# Patient Record
Sex: Male | Born: 1940 | Race: White | Hispanic: No | Marital: Married | Smoking: Former smoker
Health system: Southern US, Community
[De-identification: ages and names within clinical notes are randomized; demographics above are authoritative.]

## PROBLEM LIST (undated history)

## (undated) DIAGNOSIS — E291 Testicular hypofunction: Secondary | ICD-10-CM

## (undated) DIAGNOSIS — G47419 Narcolepsy without cataplexy: Secondary | ICD-10-CM

## (undated) DIAGNOSIS — M7520 Bicipital tendinitis, unspecified shoulder: Secondary | ICD-10-CM

## (undated) DIAGNOSIS — R609 Edema, unspecified: Secondary | ICD-10-CM

## (undated) DIAGNOSIS — J449 Chronic obstructive pulmonary disease, unspecified: Secondary | ICD-10-CM

## (undated) DIAGNOSIS — M67919 Unspecified disorder of synovium and tendon, unspecified shoulder: Secondary | ICD-10-CM

## (undated) DIAGNOSIS — R413 Other amnesia: Secondary | ICD-10-CM

## (undated) DIAGNOSIS — E669 Obesity, unspecified: Secondary | ICD-10-CM

## (undated) DIAGNOSIS — E119 Type 2 diabetes mellitus without complications: Secondary | ICD-10-CM

## (undated) DIAGNOSIS — R310 Gross hematuria: Secondary | ICD-10-CM

## (undated) DIAGNOSIS — I7090 Unspecified atherosclerosis: Secondary | ICD-10-CM

## (undated) DIAGNOSIS — R0902 Hypoxemia: Secondary | ICD-10-CM

## (undated) HISTORY — DX: Obesity, unspecified: E66.9

## (undated) HISTORY — DX: Gross hematuria: R31.0

## (undated) HISTORY — PX: KNEE ARTHROSCOPY: SUR90

## (undated) HISTORY — PX: CORONARY STENT PLACEMENT: SHX1402

## (undated) HISTORY — DX: Hypoxemia: R09.02

## (undated) HISTORY — DX: Unspecified disorder of synovium and tendon, unspecified shoulder: M67.919

## (undated) HISTORY — DX: Bicipital tendinitis, unspecified shoulder: M75.20

## (undated) HISTORY — DX: Other amnesia: R41.3

## (undated) HISTORY — DX: Edema, unspecified: R60.9

## (undated) HISTORY — DX: Unspecified atherosclerosis: I70.90

## (undated) HISTORY — DX: Testicular hypofunction: E29.1

## (undated) HISTORY — DX: Chronic obstructive pulmonary disease, unspecified: J44.9

---

## 2011-01-17 DIAGNOSIS — G629 Polyneuropathy, unspecified: Secondary | ICD-10-CM | POA: Insufficient documentation

## 2011-03-30 DIAGNOSIS — E291 Testicular hypofunction: Secondary | ICD-10-CM | POA: Insufficient documentation

## 2011-03-30 DIAGNOSIS — R29898 Other symptoms and signs involving the musculoskeletal system: Secondary | ICD-10-CM | POA: Insufficient documentation

## 2011-03-30 HISTORY — DX: Testicular hypofunction: E29.1

## 2011-06-25 DIAGNOSIS — J309 Allergic rhinitis, unspecified: Secondary | ICD-10-CM | POA: Insufficient documentation

## 2011-09-04 DIAGNOSIS — I2510 Atherosclerotic heart disease of native coronary artery without angina pectoris: Secondary | ICD-10-CM | POA: Insufficient documentation

## 2011-09-23 DIAGNOSIS — E559 Vitamin D deficiency, unspecified: Secondary | ICD-10-CM | POA: Insufficient documentation

## 2011-09-23 DIAGNOSIS — Z125 Encounter for screening for malignant neoplasm of prostate: Secondary | ICD-10-CM | POA: Insufficient documentation

## 2012-02-06 DIAGNOSIS — G47419 Narcolepsy without cataplexy: Secondary | ICD-10-CM | POA: Insufficient documentation

## 2012-08-30 DIAGNOSIS — G4733 Obstructive sleep apnea (adult) (pediatric): Secondary | ICD-10-CM | POA: Insufficient documentation

## 2013-04-28 DIAGNOSIS — R310 Gross hematuria: Secondary | ICD-10-CM

## 2013-04-28 HISTORY — DX: Gross hematuria: R31.0

## 2013-10-09 DIAGNOSIS — R269 Unspecified abnormalities of gait and mobility: Secondary | ICD-10-CM | POA: Insufficient documentation

## 2014-01-13 DIAGNOSIS — R413 Other amnesia: Secondary | ICD-10-CM

## 2014-01-13 HISTORY — DX: Other amnesia: R41.3

## 2014-03-17 DIAGNOSIS — H9190 Unspecified hearing loss, unspecified ear: Secondary | ICD-10-CM | POA: Insufficient documentation

## 2014-05-11 ENCOUNTER — Ambulatory Visit: Payer: Self-pay | Admitting: Family Medicine

## 2014-05-29 DIAGNOSIS — M7520 Bicipital tendinitis, unspecified shoulder: Secondary | ICD-10-CM | POA: Insufficient documentation

## 2014-05-29 DIAGNOSIS — M67919 Unspecified disorder of synovium and tendon, unspecified shoulder: Secondary | ICD-10-CM | POA: Insufficient documentation

## 2014-05-29 HISTORY — DX: Unspecified disorder of synovium and tendon, unspecified shoulder: M67.919

## 2014-05-29 HISTORY — DX: Bicipital tendinitis, unspecified shoulder: M75.20

## 2014-08-03 ENCOUNTER — Inpatient Hospital Stay: Payer: Self-pay | Admitting: Internal Medicine

## 2014-10-22 NOTE — H&P (Signed)
PATIENT NAME:  Miguel Palmer, Miguel Palmer MR#:  [id number] DATE OF BIRTH:  04/20/1941  DATE OF ADMISSION:  08/03/2014  PRIMARY CARE PHYSICIAN:  From [facility].   EMERGENCY ROOM PHYSICIAN:  Dr. Corky Downs.   CHIEF COMPLAINT:  Shortness of breath, cough.   HISTORY OF PRESENT ILLNESS: A 74 year old male patient with a history of high blood pressure, coronary artery disease, diabetes, came in because of shortness of breath, cough, and some hemoptysis. The patient has been having these symptoms for about month and went to see his primary doctor, who gave antibiotics. The patient finished 3 rounds of antibiotics, reportedly he took 5 days of azithromycin and Levaquin for 10 days, and today comes in because of because of persistent shortness of breath and cough and phlegm with occasional blood-tinged sputum. The patient's O2 saturations are 80% on room air, he is right now on 3-4 liters, saturating around 95%.  He uses 2 liters of oxygen at night only.  The patient is afebrile. The patient has no chest pain. Does have lower extremity edema.  The patient has no weight loss. No night sweats.   PAST MEDICAL HISTORY: Significant for coronary artery disease, hypertension, hyperlipidemia, diabetes mellitus type 2, history of narcolepsy, catalepsy, peripheral neuropathy  PAST SURGICAL HISTORY:  Includes CAD with 7 stents placed, last stent was 8 years ago. His cardiologist at [facility] Dr. Aline Brochure. The patient denies any other surgeries.    SOCIAL HISTORY:  Previous smoker; quit 20 years ago. No alcohol. No drugs. Lives with the wife.   FAMILY HISTORY: Significant for grandfather and father had heart problems, but no history of lung cancers. The patient's mother had emphysema.   MEDICATIONS:    1. Crestor 20 mg p.o. daily.  .  3. Effient 10 mg in the morning.  4. Neurontin 300 mg 2 tablets in the morning and noon, afternoon, and evening.  5. Imdur mononitrate 30 mg in the morning.  6. Metformin 500 mg in the morning.   7.Crestor 20 mg in the morning.  8. Modafinil 200 mg morning, noon, and afternoon.  9. Niaspan 1000 mg at bedtime.  10. Nitroglycerin 0.4 mg as needed.  11. He is also on supplements.  12. Aspirin 81 mg daily.  13. Co-Q10 200 mg p.o. b.i.d.  14. Ginkgo biloba 60 mg twice daily.   15. Glucosamine sulfate 1000 mg p.o. b.i.d.   16. Krill oil 300 mg daily.   17. Multivitamin 1 tablet daily.  18. Turmeric 400 mg once a day.  19. Tylenol as needed.   ALLERGIES: He has no known allergies.   REVIEW OF SYSTEMS:   CONSTITUTIONAL: The patient has no fever. Does have some shortness of breath, cough. Denies any weight loss.  EYES: No blurred vision. No inflammation. No glaucoma,  EARS, NOSE, THROAT: Hard of hearing. No epistaxis. No difficulty swallowing.  RESPIRATION:  Does have some cough and also some occasional phlegm with some blood-tinged sputum. The patient says that he has sleep apnea and supposed to take CPAP, but he is not using that.  CARDIOVASCULAR: No chest pain. No orthopnea. Does have pedal edema.  GASTROINTESTINAL: No nausea. No vomiting. No abdominal pain.  GENITOURINARY: No dysuria.  ENDOCRINE: No polyuria or nocturia.  HEMATOLOGIC:  No anemia.  INTEGUMENTARY: No skin rashes.  MUSCULOSKELETAL: No joint pain.  NEUROLOGIC: No numbness or weakness. The patient has a history of neuropathy.  PSYCHIATRIC: No anxiety and insomnia.  Has cataplexy. The patient also has narcolepsy.    PHYSICAL  EXAMINATION:  VITAL SIGNS: Temperature 98.4, heart rate 105, blood pressure 152/89, saturations 80% on room air and he is on 2 liters, saturation 95%.  GENERAL: He is alert, awake, oriented, obese male with a BMI 46.6.  Seen in the ER, not in distress, answering questions appropriately.  HEENT:  Head is atraumatic, normocephalic. Eyes, pupils equal, reacting to light. Extraocular movements are intact. The patient has no conjunctivitis. Hearing is a factor, he is hard of hearing. Tympanic  membranes are clear. No pharyngeal erythema. Mucous membranes are normal. Dentition is good.  NECK:  No thyroid enlargement. Supple. No JVD. The patient has no carotid bruit.  RESPIRATORY: Bilateral coarse breath sounds noted in all lung fields and basilar crepitations present. Not using accessory muscles of respiration.  CARDIOVASCULAR: S1, S2 regular.  The patient has no murmurs. PMI not displaced. The patient has good pedal pulse present, good femoral pulse. Lower extremity edema up to knees bilaterally 2 +.  ABDOMEN: Soft, nontender, nondistended. Bowel sounds present. No hepatosplenomegaly.  GENITOURINARY: Deferred.  MUSCULOSKELETAL: 5 out of 5 strength upper and lower extremities. No kyphosis.  SKIN: No skin rashes. Warm and dry, well hydrated.   LYMPHATIC:  No lymphadenopathy in cervical or axillary region.  NEUROLOGIC:  Cranial nerves II through XII intact. Power 5 out of 5 in upper and lower extremities. Sensory intact. DTRs 2 + bilaterally.  PSYCHIATRIC: Mood and affect are within normal limits.  LABORATORY DATA: WBC 6.7, hemoglobin 14.9, hematocrit 46.7, platelets 152,000. Troponin less than 0.02. Electrolytes, sodium is 139, potassium 4.2, chloride 102, bicarbonate 33, BUN is 13, creatinine 0.87, glucose 122. The patient's chest x-ray showed shallow inspiration with diffuse interstitial pattern suggesting fibrosis and chronic bronchitis, the patient has nodular opacities in the right upper lung, CT is needed.  CT angio chest shows no evidence of PE, multifocal patchy areas of consolidation in both lungs most prominent in the right upper lung. The patient needs followup resolution to evaluate for underlying mass.    EKG shows sinus rhythm with first degree AV block, PACs, 96 beats per minute.   ASSESSMENT AND PLAN: This is a 74 year old male patient with acute hypoxic respiratory failure secondary to multilobar pneumonia. 1.  Failed outpatient therapy. The patient finished 2 rounds of  antibiotics, comes in because of shortness of breath and cough pneumonia on x-ray. Admit him to hospitalist service. Start on vancomycin and Zosyn and continue oxygen, add a small dose of Solu-Medrol 40 IV daily. Add Spiriva and also albuterol nebulizers. Obtain pulmonarey consult  with DR.Fleming 2.  Coronary artery disease with multiple stents placement. No chest pain at this time.  No EKG changes.  Continue him on aspirin, Effient, Crestor, Niaspan, and Imdur.  3.  Neuropathy. Continue Neurontin.  4.  History of narcolepsy, cataplexy. Continue modafinil 200 mg 3 times a day.  5.  Diabetes mellitus type 2. Hold the metformin because of CT chest with the contrast.  6.  Continue sliding scale coverage for diabetes.  7.  Lower extremity edema. The patient will be given small dose of Lasix and see how he does. The patient has a cardiologist at [facility], so we can get the records to see if he had any echo done recently.   TIME SPENT: About 55 minutes.   Discussed the plan with the patient and the patient's wife.    ____________________________ Epifanio Lesches, MD sk:bu D: 08/03/2014 19:20:44 ET T: 08/03/2014 19:52:48 ET JOB#: [id number]  cc: Epifanio Lesches, MD, <Dictator> Epifanio Lesches MD  ELECTRONICALLY SIGNED 08/28/2014 13:38

## 2014-10-22 NOTE — Discharge Summary (Signed)
PATIENT NAME:  Miguel Palmer, Miguel Palmer MR#:  [id number] DATE OF BIRTH:  [birth year]  DATE OF ADMISSION:  08/03/2014 DATE OF DISCHARGE:  08/07/2014  ADMITTING DIAGNOSES:  1.  Acute hypoxic respiratory failure secondary to multilobar pneumonia. Failed outpatient antibiotics.  2.  Coronary artery disease, with multiple stents.  3.  Neuropathy.  4.  History of narcolepsy and cataplexy.  5.  Diabetes mellitus type 2.  6.  Bilateral lower extremity edema.   DISCHARGE DIAGNOSES:  1.  Acute on chronic hypoxic respiratory failure secondary to multilobar pneumonia.  2.  Coronary artery disease, with multiple stents.  3.  Neuropathy.  4.  History of narcolepsy and cataplexy.  5.  Diabetes mellitus type 2. Holding the metformin, which needs to be resumed by the primary care physician during the followup visit.   CONSULTATIONS: Pulmonology, Herbon E. Raul Del, MD.   PROCEDURES: None.  BRIEF HISTORY AND PHYSICAL AND HOSPITAL COURSE: The patient is a 74 year old male with a history of hypertension, coronary artery disease, and diabetes mellitus who came in to the ED with a chief complaint of shortness of breath and cough with some hemoptysis. The patient was seen by primary care physician and has finished 3 rounds of antibiotics, reportedly 5 days of azithromycin and Levaquin for 10 days, with no significant improvement. The patient's pulse oximetry was at 80% on room air at the time of admission and was placed on 3 to 4 L of oxygen. The patient was saturating at 95% on 3 to 4 L of oxygen and at home he uses 2 L of oxygen during nights only. Chest x-ray has revealed multilobar pneumonia and the patient was admitted to the hospital with a chief diagnosis of acute on chronic hypoxic respiratory failure secondary to multilobar pneumonia. As patient failed 2 rounds of antibiotics with erythromycin and levofloxacin, the patient was started on Zosyn and vancomycin. He was continued on oxygen as well as a small dose of  Solu-Medrol 40 mg IV daily.   HOSPITAL COURSE: 1.  The patient was continued on IV Zosyn and vancomycin. A small dose of Solu-Medrol was given . The patient was given  albuterol nebulizer treatments. He was seen by pulmonologist, Dr. Raul Del. The patient's clinical situation significantly improved with IV antibiotics. The patient had a CAT scan of the chest which has revealed ground glass opacities. As there was a concern for atypical infection versus cryptogenic organizing pneumonia, urine for legionella antigen and serum for mycoplasma were checked. ACE level was ordered. Repeat chest x-ray had improvement and patient was improving clinically. The plan was to discharge the patient with Ceftin and azithromycin to cover community-acquired pneumonia as well as atypicals. Dr. Raul Del was recommending outpatient bronchoscopy. Clinically his situation was improved and decision was made to discharge the patient home. Resting pulse oximetry was 84% on room air, so patient was needing 4 L of oxygen during the entire hospital course. Even at the time of discharge, the patient was saturating 92% with oxygen during exertion on 4 L and 84% on room air at rest. Case management was consulted for the continued supply of oxygen, 4 L via nasal cannula.  2.  Coronary artery disease, with multiple stents. No chest pain and no EKG changes. The plan is to continue Crestor, Niaspan, and Imdur.  3.  Neuropathy: Continue Neurontin.  4.  History of narcolepsy and cataplexy. Continue his home medication modafinil 200 mg 3 times a day. 5.  Diabetes mellitus type 2. The patient had a CAT scan  of the chest with contrast so metformin was held for 72 hours. The patient was covered with insulin sliding scale and the metformin needs to be resumed in the next 2 days by the primary care physician during his followup visit. 6.  Lower extremity edema. The patient was given Lasix for lower extremity edema, which has significantly improved, and  his clinical situation significantly improved and decision is made to discharge the patient home.   PHYSICAL EXAMINATION:  VITAL SIGNS: Temperature 97.6, pulse 76, respirations 18, blood pressure 128/83, pulse oximetry is 92% on 4 L.  GENERAL APPEARANCE: Not in any acute distress. Moderately built and nourished.  HEENT: Normocephalic, atraumatic. Pupils are equally reacting to light and accommodation. No scleral icterus. No conjunctival injection. No sinus tenderness. Moist mucous membranes.  NECK: Supple. No JVD. No thyromegaly. Range of motion is intact.  LUNGS: Normal respiratory effort, minimal crackles are present. No rhonchi.  CARDIAC: Regular rate and rhythm. No murmurs.  GASTROINTESTINAL: Soft. Bowel sounds are positive in all 4 quadrants. Nontender, nondistended. No masses felt.  NEUROLOGIC: Awake, alert, oriented x 3. Follows verbal commands. Cranial nerves II through XII are grossly intact. Motor and sensory are intact.  SKIN: With no rashes, no ulcers.  PSYCHIATRIC: Awake, alert, and oriented x 3. Normal mood and affect.  LABORATORY AND IMAGING STUDIES: A repeat chest x-ray on 2022-08-19, PA and lateral views, revealed some improvement in bilateral airspace disease, most consistent with pneumonia. Recommended continued followup. CT, chest, performed on February 11, including CT angiogram. No evidence of significant pulmonary embolus. Multifocal patchy areas of consolidation in both lungs, more prominent in the right upper lung. Changes most likely representing multifocal pneumonia. Followup evaluation is recommended to exclude underlying mass lesion. The patient's creatinine on February 15 is 0.90. Sputum culture: A few gram-positive cocci in pairs, rare yeast was noticed.  MEDICATIONS AT THE TIME OF DISCHARGE: Crestor 20 mg p.o. at bedtime; Effient 10 mg, 1 tablet p.o. once daily in a.m.; gabapentin 300 mg, 2 capsules p.o. 4 times a day; isosorbide mononitrate 30 mg, 1 tablet p.o. once  daily in a.m.; Niaspan 1000 mg oral tablet, 1 tablet p.o. once daily at bedtime; nitroglycerin 0.4 mg sublingually every 5 minutes as needed for chest pain; aspirin 81 mg once daily; coenzyme Q10, 1 tablet p.o. 2 times a day; ginkgo 1 tablet p.o. 2 times a day; dextroamphetamine 15 mg oral capsule, 1 capsule p.o. 4 times a day; metformin 500 mg, 1 tablet p.o. once daily in a.m.; metoprolol succinate 25 mg, 1 tablet p.o. daily a.m.; glucosamine 1 tablet p.o. 2 times a day; krill oil 300 mg, 1 capsule p.o. once daily; multivitamin 1 tablet p.o. once daily; phosphatidylserine 60 mg 1 tablet p.o. 2 times a day; Tylenol 500 mg 2 tablets every 6 hours as needed for pain; Ceftin 500 mg 1 tablet p.o. 2 times a day; azithromycin 100 mg p.o. once daily.   ACTIVITY: As tolerated.   DIET: Diabetic and healthy heart.  FOLLOWUP: With primary care physician in 1 to 2 days and Dr. Raul Del in 1 week.  The diagnosis and plan of care were discussed in detail with the patient and his wife at bedside.   PLAN: To continue 4 L of home oxygen via nasal cannula continuously. The patient and his wife verbalized understanding of the plan.  TOTAL TIME SPENT ON THE DISCHARGE: 45 minutes.   ____________________________ Nicholes Mango, MD ag:ST D: 08/07/2014 22:10:24 ET T: 08/08/2014 01:09:45 ET JOB#: [id number]  cc: Nicholes Mango, MD, <Dictator> Herbon E. Raul Del, MD Primary care physician Nicholes Mango MD ELECTRONICALLY SIGNED 08/18/2014 14:42

## 2014-10-31 ENCOUNTER — Encounter: Payer: Medicare Other | Attending: Family Medicine | Admitting: Respiratory Therapy

## 2014-10-31 VITALS — BP 128/76 | HR 74 | Resp 14 | Ht 74.0 in | Wt 342.6 lb

## 2014-10-31 DIAGNOSIS — E785 Hyperlipidemia, unspecified: Secondary | ICD-10-CM | POA: Insufficient documentation

## 2014-10-31 DIAGNOSIS — I219 Acute myocardial infarction, unspecified: Secondary | ICD-10-CM | POA: Insufficient documentation

## 2014-10-31 DIAGNOSIS — J449 Chronic obstructive pulmonary disease, unspecified: Secondary | ICD-10-CM | POA: Insufficient documentation

## 2014-10-31 NOTE — Addendum Note (Signed)
Addended by: Joretta Bachelor on: 10/31/2014 05:08 PM   Modules accepted: Medications

## 2014-10-31 NOTE — Progress Notes (Signed)
Pulmonary Individual Treatment Plan  Patient Details  Name: Miguel Palmer MRN: [id number] Date of Birth: 1940-09-25 Referring Provider:  [name], MD  Initial Encounter Date: Date: 10/31/14  Patient's Home Medications on Admission:  Current outpatient prescriptions:  .  aspirin 81 MG tablet, Take 81 mg by mouth every other day., Disp: , Rfl:  .  co-enzyme Q-10 30 MG capsule, Take 30 mg by mouth 2 (two) times daily., Disp: , Rfl:  .  dextroamphetamine (DEXEDRINE SPANSULE) 15 MG 24 hr capsule, Take 15 mg by mouth daily., Disp: , Rfl:  .  gabapentin (NEURONTIN) 400 MG capsule, Take by mouth., Disp: , Rfl:  .  isosorbide mononitrate (IMDUR) 30 MG 24 hr tablet, TAKE 1 TABLET BY MOUTH EVERY DAY, Disp: , Rfl:  .  metFORMIN (GLUCOPHAGE) 500 MG tablet, TAKE 1 TABLET BY MOUTH DAILY., Disp: , Rfl:  .  metoprolol succinate (TOPROL-XL) 25 MG 24 hr tablet, TAKE 1 TABLET BY MOUTH EVERY DAY, Disp: , Rfl:  .  modafinil (PROVIGIL) 200 MG tablet, Take by mouth., Disp: , Rfl:  .  niacin (NIASPAN) 1000 MG CR tablet, Take by mouth., Disp: , Rfl:  .  nitroGLYCERIN (NITROSTAT) 0.4 MG SL tablet, Place under the tongue., Disp: , Rfl:  .  prasugrel (EFFIENT) 10 MG TABS tablet, TAKE 1 TABLET BY MOUTH EVERY DAY, Disp: , Rfl:  .  rosuvastatin (CRESTOR) 20 MG tablet, Take by mouth., Disp: , Rfl:  .  tiotropium (SPIRIVA) 18 MCG inhalation capsule, Place into inhaler and inhale., Disp: , Rfl:  .  Ginkgo Biloba 60 MG TABS, Take by mouth., Disp: , Rfl:  .  Glucosamine Sulfate 1000 MG CAPS, Take by mouth., Disp: , Rfl:  .  Multiple Vitamins-Minerals (MULTIVITAMIN & MINERAL PO), Take by mouth., Disp: , Rfl:   Past Medical History: No past medical history on file.  Tobacco Use: History  Smoking status  . Former Smoker -- 2.00 packs/day for 22 years  . Types: Cigarettes  Smokeless tobacco  . Former Systems developer  . Quit date: 09/03/1980    Labs: Recent Review Flowsheet Data    There is no flowsheet data to  display.       ADL UCSD:     ADL UCSD      10/31/14 1131       ADL UCSD   ADL Phase Entry     SOB Score total 75     Rest 1     Walk 1     Stairs 4     Bath 3     Dress 3     Shop 3         Pulmonary Function Assessment:     Pulmonary Function Assessment - 10/31/14 1129    Pulmonary Function Tests   FVC% 56 %   FEV1% 61 %   FEV1/FVC Ratio 83.86   RV% 64 %   DLCO% 45 %   Breath   Bilateral Breath Sounds Clear   Shortness of Breath No;Yes      Exercise Target Goals: Date: 10/31/14  Exercise Program Goal: Individual exercise prescription set with THRR, safety & activity barriers. Participant demonstrates ability to understand and report RPE using BORG scale, to self-measure pulse accurately, and to acknowledge the importance of the exercise prescription.  Exercise Prescription Goal: Starting with aerobic activity 30 plus minutes a day, 3 days per week for initial exercise prescription. Provide home exercise prescription and guidelines that participant acknowledges understanding prior to discharge.  Activity Barriers & Risk Stratification:     Activity Barriers & Risk Stratification - 10/31/14 1128    Activity Barriers & Risk Stratification   Activity Barriers None      6 Minute Walk:     6 Minute Walk      10/31/14 1702       6 Minute Walk   Phase Initial     Distance 940 feet     Walk Time 6 minutes     Resting HR 73 bpm     Resting BP 128/76 mmHg     Max Ex. HR 131 bpm     Max Ex. BP 162/84 mmHg     RPE 15     Perceived Dyspnea  4     Symptoms No        Initial Exercise Prescription:     Initial Exercise Prescription - 10/31/14 1700    Date of Initial Exercise Prescription   Date 10/31/14   Treadmill   MPH 1.4   Grade 0   Minutes 10   Bike   Level 1   Watts 30   Minutes 10   Recumbant Bike   Level 1   RPM 30   Watts 20   Minutes 10   NuStep   Level 2   Watts 50   Minutes 10   Arm Ergometer   Level 1   Watts 10    Minutes 10   Arm/Foot Ergometer   Level 1   Watts 12   Minutes 10   Recumbant Elliptical   Level 1   RPM 40   Watts 20   Minutes 10   Elliptical   Level 1   Speed 3   Minutes 1   REL-XR   Level 2   Watts 50   Minutes 10   Prescription Details   Frequency (times per week) 3   Duration Progress to 30 minutes of continuous aerobic without signs/symptoms of physical distress   Intensity   THRR REST +  30   Ratings of Perceived Exertion 11-15   Perceived Dyspnea 2-4   Resistance Training   Training Prescription Yes   Weight 1   Reps 10-12      Exercise Prescription Changes:   Discharge Exercise Prescription:    Nutrition:  Target Goals: Understanding of nutrition guidelines, daily intake of sodium <1599m, cholesterol <2066m calories 30% from fat and 7% or less from saturated fats, daily to have 5 or more servings of fruits and vegetables.  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 Participant is able to verbalize types and ability to use techniques and skills needed for reducing stress and depression.  Initial Review & Psychosocial Screening:     Initial  Psych Review & Screening - 10/31/14 0900    Initial Review   Current issues with Current Depression;Current Sleep Concerns;Current Stress Concerns   Source of Stress  Concerns Chronic Illness;Unable to participate in former interests or hobbies   [place]? Yes   Barriers   Psychosocial barriers to participate in program There are no identifiable barriers or psychosocial needs.;The patient should benefit from training in stress management and relaxation.   Screening Interventions   Interventions Encouraged to exercise;Program counselor consult      Quality of Life Scores:     Quality of Life - 10/31/14 0900    Quality of Life Scores   Health/Function Pre 11.72 %   Socioeconomic Pre 8.5 %   Psych/Spiritual Pre 16.06 %   Family Pre 10.07 %   GLOBAL Pre 17.4 %      PHQ-9:     Recent Review Flowsheet Data    Depression screen [facility] 2/9 10/31/2014   Decreased Interest 1   Down, Depressed, Hopeless 1   PHQ - 2 Score 2   Altered sleeping 0   Tired, decreased energy 3   Change in appetite 3   Feeling bad or failure about yourself  3   Trouble concentrating 1   Moving slowly or fidgety/restless 2   Suicidal thoughts 1   PHQ-9 Score 15   Difficult doing work/chores Somewhat difficult      Psychosocial Evaluation and Intervention:   Psychosocial Re-Evaluation:  Education: Education Goals: Education classes will be provided on a weekly basis, covering required topics. Participant will state understanding/return demonstration of topics presented.  Learning Barriers/Preferences:     Learning Barriers/Preferences - 10/31/14 1128    Learning Barriers/Preferences   Learning Barriers None   Learning Preferences Group Instruction;Individual Instruction;Pictoral;Written Material;Video;Verbal Instruction;Skilled Demonstration      Education Topics: Initial Evaluation Education: - Verbal, written and demonstration of respiratory meds, RPE/PD scales, oximetry and breathing techniques. Instruction on use of nebulizers and MDIs: cleaning and proper use, rinsing mouth with steroid doses and importance of monitoring MDI  activations.          Pulmonary Rehab from 10/31/2014 in [place]   Date  10/31/14   Educator  LB   Instruction Review Code  2- meets goals/outcomes      General Nutrition Guidelines/Fats and Fiber: -Group instruction provided by verbal, written material, models and posters to present the general guidelines for heart healthy nutrition. Gives an explanation and review of dietary fats and fiber.   Controlling Sodium/Reading Food Labels: -Group verbal and written material supporting the discussion of sodium use in heart healthy nutrition. Review and explanation with models, verbal and written materials for utilization of the food label.   Exercise Physiology & Risk Factors: - Group verbal and written instruction with models to review the exercise physiology of the cardiovascular system and associated critical values. Details cardiovascular disease risk factors and the goals associated with each risk factor.   Aerobic Exercise & Resistance Training: - Gives group verbal and written discussion on the health impact of inactivity. On the components of aerobic and resistive training programs and the benefits of this training and how to safely progress through these programs.   Flexibility, Balance, General Exercise Guidelines: - Provides group verbal and written instruction on the benefits of flexibility and balance training programs. Provides general exercise guidelines with specific guidelines to those with heart or lung disease. Demonstration and skill practice provided.  Stress Management: - Provides group verbal and written instruction about the health risks of elevated stress, cause of high stress, and healthy ways to reduce stress.   Depression: - Provides group verbal and written instruction on the correlation between heart/lung disease and depressed mood, treatment options, and the stigmas associated with seeking treatment.   Exercise &  Equipment Safety: - Individual verbal instruction and demonstration of equipment use and safety with use of the equipment.   Infection Prevention: - Provides verbal and written material to individual with discussion of infection control including proper hand washing and proper equipment cleaning during exercise session.   Falls Prevention: - Provides verbal and written material to individual with discussion of falls prevention and safety.   Diabetes: - Individual verbal and written instruction to review signs/symptoms of diabetes, desired ranges of glucose level fasting, after meals and with exercise. Advice that pre and post exercise glucose checks will be done for 3 sessions at entry of program.   Chronic Lung Diseases: - Group verbal and written instruction to review new updates, new respiratory medications, new advancements in procedures and treatments. Provide informative websites and "800" numbers of self-education.   Lung Procedures: - Group verbal and written instruction to describe testing methods done to diagnose lung disease. Review the outcome of test results. Describe the treatment choices: Pulmonary Function Tests, ABGs and oximetry.      Pulmonary Rehab from 10/31/2014 in [place]   Date  10/31/14   Educator  LB   Instruction Review Code  2- meets goals/outcomes      Energy Conservation: - Provide group verbal and written instruction for methods to conserve energy, plan and organize activities. Instruct on pacing techniques, use of adaptive equipment and posture/positioning to relieve shortness of breath.   Triggers: - Group verbal and written instruction to review types of environmental controls: home humidity, furnaces, filters, dust mite/pet prevention, HEPA vacuums. To discuss weather changes, air quality and the benefits of nasal washing.   Exacerbations: - Group verbal and written instruction to provide: warning signs,  infection symptoms, calling MD promptly, preventive modes, and value of vaccinations. Review: effective airway clearance, coughing and/or vibration techniques. Create an Sports administrator.   Oxygen: - Individual and group verbal and written instruction on oxygen therapy. Includes supplement oxygen, available portable oxygen systems, continuous and intermittent flow rates, oxygen safety, concentrators, and Medicare reimbursement for oxygen.      Pulmonary Rehab from 10/31/2014 in [place]   Date  10/31/14   Educator  LB   Instruction Review Code  2- meets goals/outcomes      Respiratory Medications: - Group verbal and written instruction to review medications for lung disease. Drug class, frequency, complications, importance of spacers, rinsing mouth after steroid MDI's, and proper cleaning methods for nebulizers.      Pulmonary Rehab from 10/31/2014 in [place]   Date  10/31/14   Educator  LB   Instruction Review Code  2- meets goals/outcomes      AED/CPR: - Group verbal and written instruction with the use of models to demonstrate the basic use of the AED with the basic ABC's of resuscitation.   Breathing Retraining: - Provides individuals verbal and written instruction on purpose, frequency, and proper technique of diaphragmatic breathing and pursed-lipped breathing. Applies individual practice skills.      Pulmonary Rehab from 10/31/2014 in [place]   Date  10/31/14  Educator  LB   Instruction Review Code  R- Review/reinforce      Anatomy and Physiology of the Lungs: - Group verbal and written instruction with the use of models to provide basic lung anatomy and physiology related to function, structure and complications of lung disease.   Heart Failure: - Group verbal and written instruction on the basics of heart failure: signs/symptoms, treatments, explanation of  ejection fraction, enlarged heart and cardiomyopathy.   Sleep Apnea: - Individual verbal and written instruction to review Obstructive Sleep Apnea. Review of risk factors, methods for diagnosing and types of masks and machines for OSA.    Knowledge Questionnaire Score:     Knowledge Questionnaire Score - 10/31/14 1129    Knowledge Questionnaire Score   Pre Score -5      Personal Goals and Risk Factors at Admission:     Personal Goals and Risk Factors at Admission - 10/31/14 1144    Personal Goals and Risk Factors on Admission    Weight Management Yes   Intervention Learn and follow the exercise and diet guidelines while in the program. Utilize the nutrition and education classes to help gain knowledge of the diet and exercise expectations in the program   Admit Weight 342 lb (155.13 kg)   Goal Weight 210 lb (95.255 kg)   Increase Aerobic Exercise and Physical Activity Yes   Intervention While in program, learn and follow the exercise prescription taught. Start at a low level workload and increase workload after able to maintain previous level for 30 minutes. Increase time before increasing intensity.   Understand more about Heart/Pulmonary Disease. Yes   Intervention While in program utilize professionals for any questions, and attend the education sessions. Great websites to use are www.americanheart.org or www.lung.org for reliable information.   Improve shortness of breath with ADL's Yes   Intervention While in program, learn and follow the exercise prescription taught. Start at a low level workload and increase workload ad advised by the exercise physiologist. Increase time before increasing intensity.   Develop more efficient breathing techniques such as purse lipped breathing and diaphragmatic breathing; and practicing self-pacing with activity Yes   Intervention While in program, learn and utilize the specific breathing techniques taught to you. Continue to practice and use  the techniques as needed.   Increase knowledge of respiratory medications and ability to use respiratory devices properly.  Yes   Diabetes Yes   Goal Blood glucose control identified by blood glucose values, HgbA1C. Participant verbalizes understanding of the signs/symptoms of hyper/hypo glycemia, proper foot care and importance of medication and nutrition plan for blood glucose control.   Intervention Provide nutrition & aerobic exercise along with prescribed medications to achieve blood glucose in normal ranges: Fasting 65-99 mg/dL   Hypertension Yes   Goal Participant will see blood pressure controlled within the values of 140/76m/Hg or within value directed by their physician.   Intervention Provide nutrition & aerobic exercise along with prescribed medications to achieve BP 140/90 or less.   Lipids Yes   Goal Cholesterol controlled with medications as prescribed, with individualized exercise RX and with personalized nutrition plan. Value goals: LDL < 768m HDL > 4045mParticipant states understanding of desired cholesterol values and following prescriptions.   Intervention Provide nutrition & aerobic exercise along with prescribed medications to achieve LDL <10m76mDL >40mg65mStress Yes   Goal To meet with psychosocial counselor for stress and relaxation information and guidance. To state understanding of performing relaxation techniques and or  identifying personal stressors.   Intervention Provide education on types of stress, identifiying stressors, and ways to cope with stress. Provide demonstration and active practice of relaxation techniques.   Personal Goal Other Yes   Personal Goal "I want to sing without shortness of breath."   Intervention Other  Reduce shortness of breath with exercise and pursed- lip breathing   Intervention While in program learn and demonstrate appropriate use of your oxygen therapy by increasing flow with exertion, manage oxygen tank operation, including  continuous and intermittent flow.  Understanding oxygen is a drug ordered by your physician.      Personal Goals and Risk Factors Review:    Personal Goals Discharge:    Comments:

## 2014-10-31 NOTE — Progress Notes (Signed)
Pulmonary Individual Treatment Plan  Patient Details  Name: Miguel Palmer MRN: [id number] Date of Birth: 1941-02-16 Referring Provider:  [name], MD  Initial Encounter Date:    Patient's Home Medications on Admission: No current outpatient prescriptions on file.  Past Medical History: No past medical history on file.  Tobacco Use: History  Smoking status   Former Smoker -- 2.00 packs/day for 22 years   Types: Cigarettes  Smokeless tobacco   Former Systems developer   Quit date: 09/03/1980    Labs: Recent Review Flowsheet Data    There is no flowsheet data to display.       ADL [facility]:     ADL [facility]      10/31/14 1131       ADL [facility]   ADL Phase Entry     SOB Score total 75     Rest 1     Walk 1     Stairs 4     Bath 3     Dress 3     Shop 3         Pulmonary Function Assessment:     Pulmonary Function Assessment - 10/31/14 1129    Pulmonary Function Tests   FVC% 56 %   FEV1% 61 %   FEV1/FVC Ratio 83.86   RV% 64 %   DLCO% 45 %   Breath   Bilateral Breath Sounds Clear   Shortness of Breath No;Yes      Exercise Target Goals:    Exercise Program Goal: Individual exercise prescription set with THRR, safety & activity barriers. Participant demonstrates ability to understand and report RPE using BORG scale, to self-measure pulse accurately, and to acknowledge the importance of the exercise prescription.  Exercise Prescription Goal: Starting with aerobic activity 30 plus minutes a day, 3 days per week for initial exercise prescription. Provide home exercise prescription and guidelines that participant acknowledges understanding prior to discharge.  Activity Barriers & Risk Stratification:     Activity Barriers & Risk Stratification - 10/31/14 1128    Activity Barriers & Risk Stratification   Activity Barriers None      6 Minute Walk:   Initial Exercise Prescription:   Exercise Prescription Changes:   Discharge Exercise  Prescription:    Nutrition:  Target Goals: Understanding of nutrition guidelines, daily intake of sodium <1529m, cholesterol <2077m calories 30% from fat and 7% or less from saturated fats, daily to have 5 or more servings of fruits and vegetables.  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 Participant is able to verbalize types and ability to use techniques and skills needed for reducing stress and depression.  Initial Review & Psychosocial Screening:     Initial Psych Review & Screening - 10/31/14 0900    Initial Review   Current issues with Current Depression;Current Sleep Concerns;Current Stress Concerns   Source of Stress Concerns Chronic Illness;Unable to participate in former interests or hobbies   [email or web address]   Barriers   Psychosocial barriers to participate in program There are no identifiable barriers or psychosocial  needs.;The patient should benefit from training in stress management and relaxation.   Screening Interventions   Interventions Encouraged to exercise;Program counselor consult      Quality of Life Scores:     Quality of Life - 10/31/14 0900    Quality of Life Scores   Health/Function Pre 11.72 %   Socioeconomic Pre 8.5 %   Psych/Spiritual Pre 16.06 %   Family Pre 10.07 %   GLOBAL Pre 17.4 %      PHQ-9:     Recent Review Flowsheet Data    Depression screen [facility] 2/9 10/31/2014   Decreased Interest 1   Down, Depressed, Hopeless 1   PHQ - 2 Score 2   Altered sleeping 0   Tired, decreased energy 3   Change in  appetite 3   Feeling bad or failure about yourself  3   Trouble concentrating 1   Moving slowly or fidgety/restless 2   Suicidal thoughts 1   PHQ-9 Score 15   Difficult doing work/chores Somewhat difficult      Psychosocial Evaluation and Intervention:   Psychosocial Re-Evaluation:  Education: Education Goals: Education classes will be provided on a weekly basis, covering required topics. Participant will state understanding/return demonstration of topics presented.  Learning Barriers/Preferences:     Learning Barriers/Preferences - 10/31/14 1128    Learning Barriers/Preferences   Learning Barriers None   Learning Preferences Group Instruction;Individual Instruction;Pictoral;Written Material;Video;Verbal Instruction;Skilled Demonstration      Education Topics: Initial Evaluation Education: - Verbal, written and demonstration of respiratory meds, RPE/PD scales, oximetry and breathing techniques. Instruction on use of nebulizers and MDIs: cleaning and proper use, rinsing mouth with steroid doses and importance of monitoring MDI activations.          Pulmonary Rehab from 10/31/2014 in [place]   Date  10/31/14   Educator  LB   Instruction Review Code  2- meets goals/outcomes      General Nutrition Guidelines/Fats and Fiber: -Group instruction provided by verbal, written material, models and posters to present the general guidelines for heart healthy nutrition. Gives an explanation and review of dietary fats and fiber.   Controlling Sodium/Reading Food Labels: -Group verbal and written material supporting the discussion of sodium use in heart healthy nutrition. Review and explanation with models, verbal and written materials for utilization of the food label.   Exercise Physiology & Risk Factors: - Group verbal and written instruction with models to review the exercise physiology of the cardiovascular system and associated critical  values. Details cardiovascular disease risk factors and the goals associated with each risk factor.   Aerobic Exercise & Resistance Training: - Gives group verbal and written discussion on the health impact of inactivity. On the components of aerobic and resistive training programs and the benefits of this training and how to safely progress through these programs.   Flexibility, Balance, General Exercise Guidelines: - Provides group verbal and written instruction on the benefits of flexibility and balance training programs. Provides general exercise guidelines with specific guidelines to those with heart or lung disease. Demonstration and skill practice provided.   Stress Management: - Provides group verbal and written instruction about the health risks of elevated stress, cause of high stress, and healthy ways to reduce stress.   Depression: - Provides group verbal and written instruction on the correlation between heart/lung disease and depressed mood, treatment options, and the stigmas associated with seeking treatment.   Exercise & Equipment Safety: - Individual verbal instruction and demonstration  of equipment use and safety with use of the equipment.   Infection Prevention: - Provides verbal and written material to individual with discussion of infection control including proper hand washing and proper equipment cleaning during exercise session.   Falls Prevention: - Provides verbal and written material to individual with discussion of falls prevention and safety.   Diabetes: - Individual verbal and written instruction to review signs/symptoms of diabetes, desired ranges of glucose level fasting, after meals and with exercise. Advice that pre and post exercise glucose checks will be done for 3 sessions at entry of program.   Chronic Lung Diseases: - Group verbal and written instruction to review new updates, new respiratory medications, new advancements in procedures and  treatments. Provide informative websites and "800" numbers of self-education.   Lung Procedures: - Group verbal and written instruction to describe testing methods done to diagnose lung disease. Review the outcome of test results. Describe the treatment choices: Pulmonary Function Tests, ABGs and oximetry.      Pulmonary Rehab from 10/31/2014 in [place]   Date  10/31/14   Educator  LB   Instruction Review Code  2- meets goals/outcomes      Energy Conservation: - Provide group verbal and written instruction for methods to conserve energy, plan and organize activities. Instruct on pacing techniques, use of adaptive equipment and posture/positioning to relieve shortness of breath.   Triggers: - Group verbal and written instruction to review types of environmental controls: home humidity, furnaces, filters, dust mite/pet prevention, HEPA vacuums. To discuss weather changes, air quality and the benefits of nasal washing.   Exacerbations: - Group verbal and written instruction to provide: warning signs, infection symptoms, calling MD promptly, preventive modes, and value of vaccinations. Review: effective airway clearance, coughing and/or vibration techniques. Create an Sports administrator.   Oxygen: - Individual and group verbal and written instruction on oxygen therapy. Includes supplement oxygen, available portable oxygen systems, continuous and intermittent flow rates, oxygen safety, concentrators, and Medicare reimbursement for oxygen.      Pulmonary Rehab from 10/31/2014 in [place]   Date  10/31/14   Educator  LB   Instruction Review Code  2- meets goals/outcomes      Respiratory Medications: - Group verbal and written instruction to review medications for lung disease. Drug class, frequency, complications, importance of spacers, rinsing mouth after steroid MDI's, and proper cleaning methods for nebulizers.       Pulmonary Rehab from 10/31/2014 in [place]   Date  10/31/14   Educator  LB   Instruction Review Code  2- meets goals/outcomes      AED/CPR: - Group verbal and written instruction with the use of models to demonstrate the basic use of the AED with the basic ABC's of resuscitation.   Breathing Retraining: - Provides individuals verbal and written instruction on purpose, frequency, and proper technique of diaphragmatic breathing and pursed-lipped breathing. Applies individual practice skills.      Pulmonary Rehab from 10/31/2014 in [place]   Date  10/31/14   Educator  LB   Instruction Review Code  R- Review/reinforce      Anatomy and Physiology of the Lungs: - Group verbal and written instruction with the use of models to provide basic lung anatomy and physiology related to function, structure and complications of lung disease.   Heart Failure: - Group verbal and written instruction on the basics of heart  failure: signs/symptoms, treatments, explanation of ejection fraction, enlarged heart and cardiomyopathy.   Sleep Apnea: - Individual verbal and written instruction to review Obstructive Sleep Apnea. Review of risk factors, methods for diagnosing and types of masks and machines for OSA.    Knowledge Questionnaire Score:     Knowledge Questionnaire Score - 10/31/14 1129    Knowledge Questionnaire Score   Pre Score -5      Personal Goals and Risk Factors at Admission:     Personal Goals and Risk Factors at Admission - 10/31/14 1144    Personal Goals and Risk Factors on Admission    Weight Management Yes   Intervention Learn and follow the exercise and diet guidelines while in the program. Utilize the nutrition and education classes to help gain knowledge of the diet and exercise expectations in the program   Admit Weight 342 lb (155.13 kg)   Goal Weight 210 lb (95.255 kg)   Increase Aerobic  Exercise and Physical Activity Yes   Intervention While in program, learn and follow the exercise prescription taught. Start at a low level workload and increase workload after able to maintain previous level for 30 minutes. Increase time before increasing intensity.   Understand more about Heart/Pulmonary Disease. Yes   Intervention While in program utilize professionals for any questions, and attend the education sessions. Great websites to use are www.americanheart.org or www.lung.org for reliable information.   Improve shortness of breath with ADL's Yes   Intervention While in program, learn and follow the exercise prescription taught. Start at a low level workload and increase workload ad advised by the exercise physiologist. Increase time before increasing intensity.   Develop more efficient breathing techniques such as purse lipped breathing and diaphragmatic breathing; and practicing self-pacing with activity Yes   Intervention While in program, learn and utilize the specific breathing techniques taught to you. Continue to practice and use the techniques as needed.   Increase knowledge of respiratory medications and ability to use respiratory devices properly.  Yes   Intervention While in program learn and demonstrate appropriate use of your oxygen therapy by increasing flow with exertion, manage oxygen tank operation, including continuous and intermittent flow.  Understanding oxygen is a drug ordered by your physician.   Diabetes Yes   Goal Blood glucose control identified by blood glucose values, HgbA1C. Participant verbalizes understanding of the signs/symptoms of hyper/hypo glycemia, proper foot care and importance of medication and nutrition plan for blood glucose control.   Intervention Provide nutrition & aerobic exercise along with prescribed medications to achieve blood glucose in normal ranges: Fasting 65-99 mg/dL   Hypertension Yes   Goal Participant will see blood pressure  controlled within the values of 140/35m/Hg or within value directed by their physician.   Intervention Provide nutrition & aerobic exercise along with prescribed medications to achieve BP 140/90 or less.   Lipids Yes   Goal Cholesterol controlled with medications as prescribed, with individualized exercise RX and with personalized nutrition plan. Value goals: LDL < 773m HDL > 4036mParticipant states understanding of desired cholesterol values and following prescriptions.   Intervention Provide nutrition & aerobic exercise along with prescribed medications to achieve LDL <44m85mDL >40mg45mStress Yes   Goal To meet with psychosocial counselor for stress and relaxation information and guidance. To state understanding of performing relaxation techniques and or identifying personal stressors.   Intervention Provide education on types of stress, identifiying stressors, and ways to cope with stress. Provide demonstration and active practice  of relaxation techniques.   Personal Goal Other Yes   Personal Goal "I want to sing without shortness of breath."   Intervention Other  Reduce shortness of breath with exercise and pursed- lip breathing      Personal Goals and Risk Factors Review:    Personal Goals Discharge:    Comments:

## 2014-11-03 ENCOUNTER — Encounter: Payer: Medicare Other | Admitting: *Deleted

## 2014-11-03 VITALS — BP 108/64

## 2014-11-03 DIAGNOSIS — J449 Chronic obstructive pulmonary disease, unspecified: Secondary | ICD-10-CM | POA: Diagnosis not present

## 2014-11-03 LAB — GLUCOSE, CAPILLARY: Glucose-Capillary: 100 mg/dL — ABNORMAL HIGH (ref 65–99)

## 2014-11-03 NOTE — Progress Notes (Signed)
Daily Session Note  Patient Details  Name: Ved Martos MRN: [id number] Date of Birth: Nov 22, 1940 Referring Provider:  [name], MD  Encounter Date: 11/03/2014  Check In:     Session Check In - 11/03/14 1202    Check-In   Staff Present Gerlene Burdock RN, BSN;Stacey Blanch Media RRT, RCP Respiratory Therapist;Renee Dillard Essex MS, ACSM CEP Exercise Physiologist   ER physicians immediately available to respond to emergencies [facility] immediately available ER MD   Physician(s) Dr. Kerman Passey and Dr. Jacqualine Code   Medication changes reported     No   Fall or balance concerns reported    No   Warm-up and Cool-down Performed on first and last piece of equipment   VAD Patient? No   Pain Assessment   Currently in Pain? No/denies         Goals Met:  Tolerated Exercise well.  Goals Unmet:  Not Applicable  Goals Comments:   Dr. Emily Filbert is Medical Director for [place] and [facility].

## 2014-11-03 NOTE — Progress Notes (Signed)
Daily Session Note  Patient Details  Name: Miguel Palmer MRN: [id number] Date of Birth: 1941/01/12 Referring Provider:  [name], MD  Encounter Date: 11/03/2014  Check In:     Session Check In - 11/03/14 1202    Check-In   Staff Present Gerlene Burdock RN, BSN;Vonnie Ligman Blanch Media RRT, RCP Respiratory Therapist;Renee Dillard [place] MS, ACSM CEP Exercise Physiologist   ER physicians immediately available to respond to emergencies [facility] immediately available ER MD   Physician(s) Dr. Kerman Passey and Dr. Jacqualine Code   Medication changes reported     No   Fall or balance concerns reported    No   Warm-up and Cool-down Performed on first and last piece of equipment   VAD Patient? No   Pain Assessment   Currently in Pain? No/denies         POCT Glucose - 11/03/14 1524    POCT Blood Glucose   Pre-Exercise 100 mg/dL   Post-Exercise 85 mg/dL          Exercise Prescription Changes - 11/03/14 1500    NuStep   Level 2   Watts 50   Minutes 10      Goals Met:  Proper associated with RPD/PD & O2 Sat Exercise tolerated well Personal goals reviewed Queuing for purse lip breathing  Goals Unmet:  Not Applicable  Goals Comments: First Day of Exercise.  Peanut Butter Crackers given for low Blood Glucose level.  Education ran over today, so [name] was only able to use to T5NS today.   Dr. Emily Filbert is Medical Director for [place] and [facility].

## 2014-11-06 ENCOUNTER — Encounter: Payer: Medicare Other | Admitting: *Deleted

## 2014-11-06 DIAGNOSIS — J449 Chronic obstructive pulmonary disease, unspecified: Secondary | ICD-10-CM | POA: Diagnosis not present

## 2014-11-06 LAB — GLUCOSE, CAPILLARY: Glucose-Capillary: 95 mg/dL (ref 65–99)

## 2014-11-06 NOTE — Progress Notes (Signed)
Daily Session Note  Patient Details  Name: Miguel Palmer MRN: [id number] Date of Birth: February 24, 1941 Referring Provider:  [name], MD  Encounter Date: 11/06/2014  Check In:     Session Check In - 11/06/14 1247    Check-In   Staff Present Carson Myrtle BS, RRT, Respiratory Therapist;Ilian Wessell RN, BSN;Steven Way BS, ACSM EP-C, Exercise Physiologist   ER physicians immediately available to respond to emergencies [facility] immediately available ER MD   Physician(s) Dr. Karma Greaser, Dr. Corky Downs   Medication changes reported     No   Fall or balance concerns reported    No   Warm-up and Cool-down Performed on first and last piece of equipment   VAD Patient? No   Pain Assessment   Currently in Pain? Other (Comment)  No change in chronic pain         Goals Met:  Proper associated with RPD/PD & O2 Sat Exercise tolerated well  Goals Unmet:  Not Applicable  Goals Comments:    Dr. Emily Filbert is Medical Director for [place] and [facility].

## 2014-11-07 LAB — GLUCOSE, CAPILLARY: Glucose-Capillary: 102 mg/dL — ABNORMAL HIGH (ref 65–99)

## 2014-11-08 DIAGNOSIS — J449 Chronic obstructive pulmonary disease, unspecified: Secondary | ICD-10-CM

## 2014-11-08 LAB — GLUCOSE, CAPILLARY
GLUCOSE-CAPILLARY: 96 mg/dL (ref 65–99)
Glucose-Capillary: 99 mg/dL (ref 65–99)

## 2014-11-08 NOTE — Progress Notes (Signed)
Daily Session Note  Patient Details  Name: Miguel Palmer MRN: [id number] Date of Birth: 1940/09/05 Referring Provider:  [name], MD  Encounter Date: 11/08/2014  Check In:     Session Check In - 11/08/14 1224    Check-In   Staff Present Lestine Box BS, ACSM EP-C, Exercise Physiologist;Carroll Enterkin RN, BSN;Laureen Brown BS, RRT, Respiratory Therapist   ER physicians immediately available to respond to emergencies [facility] immediately available ER MD   Physician(s) gayle and williams   Medication changes reported     No   Fall or balance concerns reported    No   Warm-up and Cool-down Performed on first and last piece of equipment   VAD Patient? No   Pain Assessment   Currently in Pain? No/denies         Goals Met:  Proper associated with RPD/PD & O2 Sat Exercise tolerated well No report of cardiac concerns or symptoms Strength training completed today  Goals Unmet:  Not Applicable  Goals Comments:    Dr. Emily Filbert is Medical Director for [place] and [facility].

## 2014-11-10 ENCOUNTER — Encounter: Payer: Medicare Other | Admitting: Respiratory Therapy

## 2014-11-10 DIAGNOSIS — G4733 Obstructive sleep apnea (adult) (pediatric): Secondary | ICD-10-CM

## 2014-11-10 DIAGNOSIS — J449 Chronic obstructive pulmonary disease, unspecified: Secondary | ICD-10-CM | POA: Diagnosis not present

## 2014-11-10 NOTE — Progress Notes (Signed)
Daily Session Note  Patient Details  Name: Miguel Palmer MRN: [id number] Date of Birth: 29-Oct-1940 Referring Provider:  [name], MD  Encounter Date: 11/10/2014  Check In:     Session Check In - 11/10/14 1312    Check-In   Staff Present Frederich Cha RRT, RCP Respiratory Therapist;Carroll Enterkin RN, Drusilla Kanner MS, ACSM CEP Exercise Physiologist   ER physicians immediately available to respond to emergencies [facility] immediately available ER MD   Physician(s) Dr. Reita Cliche and Dr. Corky Downs   Medication changes reported     No   Fall or balance concerns reported    No   Warm-up and Cool-down Performed on first and last piece of equipment   VAD Patient? No   Pain Assessment   Currently in Pain? No/denies           Exercise Prescription Changes - 11/10/14 1300    Resistance Training   Training Prescription Yes  2 lbs.   Weight 2   Treadmill   MPH 1.5      Goals Met:  Proper associated with RPD/PD & O2 Sat Independence with exercise equipment Exercise tolerated well Personal goals reviewed  Goals Unmet:  Not Applicable  Goals Comments:    Dr. Emily Filbert is Medical Director for [place] and [facility].

## 2014-11-13 ENCOUNTER — Encounter: Payer: Medicare Other | Admitting: *Deleted

## 2014-11-13 DIAGNOSIS — J449 Chronic obstructive pulmonary disease, unspecified: Secondary | ICD-10-CM

## 2014-11-13 DIAGNOSIS — G4733 Obstructive sleep apnea (adult) (pediatric): Secondary | ICD-10-CM

## 2014-11-13 NOTE — Progress Notes (Signed)
Pulmonary Individual Treatment Plan  Patient Details  Name: Miguel Palmer MRN: [id number] Date of Birth: 03-Nov-1940 Referring Provider:  [name], MD  Initial Encounter Date:    Visit Diagnosis: COPD, mild  Obstructive sleep apnea  Patient's Home Medications on Admission:  Current outpatient prescriptions:  .  aspirin 81 MG tablet, Take 81 mg by mouth every other day., Disp: , Rfl:  .  co-enzyme Q-10 30 MG capsule, Take 30 mg by mouth 2 (two) times daily., Disp: , Rfl:  .  dextroamphetamine (DEXEDRINE SPANSULE) 15 MG 24 hr capsule, Take 15 mg by mouth daily., Disp: , Rfl:  .  gabapentin (NEURONTIN) 400 MG capsule, Take by mouth., Disp: , Rfl:  .  Ginkgo Biloba 60 MG TABS, Take by mouth., Disp: , Rfl:  .  Glucosamine Sulfate 1000 MG CAPS, Take by mouth., Disp: , Rfl:  .  isosorbide mononitrate (IMDUR) 30 MG 24 hr tablet, TAKE 1 TABLET BY MOUTH EVERY DAY, Disp: , Rfl:  .  metFORMIN (GLUCOPHAGE) 500 MG tablet, TAKE 1 TABLET BY MOUTH DAILY., Disp: , Rfl:  .  metoprolol succinate (TOPROL-XL) 25 MG 24 hr tablet, TAKE 1 TABLET BY MOUTH EVERY DAY, Disp: , Rfl:  .  modafinil (PROVIGIL) 200 MG tablet, Take by mouth., Disp: , Rfl:  .  Multiple Vitamins-Minerals (MULTIVITAMIN & MINERAL PO), Take by mouth., Disp: , Rfl:  .  niacin (NIASPAN) 1000 MG CR tablet, Take by mouth., Disp: , Rfl:  .  nitroGLYCERIN (NITROSTAT) 0.4 MG SL tablet, Place under the tongue., Disp: , Rfl:  .  prasugrel (EFFIENT) 10 MG TABS tablet, TAKE 1 TABLET BY MOUTH EVERY DAY, Disp: , Rfl:  .  rosuvastatin (CRESTOR) 20 MG tablet, Take by mouth., Disp: , Rfl:  .  tiotropium (SPIRIVA) 18 MCG inhalation capsule, Place into inhaler and inhale., Disp: , Rfl:   Past Medical History: No past medical history on file.  Tobacco Use: History  Smoking status  . Former Smoker -- 2.00 packs/day for 22 years  . Types: Cigarettes  Smokeless tobacco  . Former Systems developer  . Quit date: 09/03/1980    Labs: Recent Review  Flowsheet Data    There is no flowsheet data to display.         POCT Glucose      11/03/14 1524 11/06/14 1449 11/08/14 1130       POCT Blood Glucose   Pre-Exercise 100 mg/dL 100 mg/dL      Post-Exercise 85 mg/dL 85 mg/dL      Pre-Exercise #2  102 mg/dL      Post-Exercise #2  95 mg/dL  Peanut butter crackers given for after class on the way to lunch      Pre-Exercise #3   96 mg/dL     Post-Exercise #3   99 mg/dL        ADL UCSD:     ADL UCSD      10/31/14 1131       ADL UCSD   ADL Phase Entry     SOB Score total 75     Rest 1     Walk 1     Stairs 4     Bath 3     Dress 3     Shop 3         Pulmonary Function Assessment:     Pulmonary Function Assessment - 10/31/14 1129    Pulmonary Function Tests   FVC% 56 %   FEV1% 61 %  FEV1/FVC Ratio 83.86   RV% 64 %   DLCO% 45 %   Breath   Bilateral Breath Sounds Clear   Shortness of Breath No;Yes      Exercise Target Goals:    Exercise Program Goal: Individual exercise prescription set with THRR, safety & activity barriers. Participant demonstrates ability to understand and report RPE using BORG scale, to self-measure pulse accurately, and to acknowledge the importance of the exercise prescription.  Exercise Prescription Goal: Starting with aerobic activity 30 plus minutes a day, 3 days per week for initial exercise prescription. Provide home exercise prescription and guidelines that participant acknowledges understanding prior to discharge.  Activity Barriers & Risk Stratification:     Activity Barriers & Risk Stratification - 10/31/14 1128    Activity Barriers & Risk Stratification   Activity Barriers None      6 Minute Walk:     6 Minute Walk      10/31/14 1702       6 Minute Walk   Phase Initial     Distance 940 feet     Walk Time 6 minutes     Resting HR 73 bpm     Resting BP 128/76 mmHg     Max Ex. HR 131 bpm     Max Ex. BP 162/84 mmHg     RPE 15     Perceived Dyspnea  4      Symptoms No        Initial Exercise Prescription:     Initial Exercise Prescription - 10/31/14 1700    Date of Initial Exercise Prescription   Date 10/31/14   Treadmill   MPH 1.4   Grade 0   Minutes 10   Bike   Level 1   Watts 30   Minutes 10   Recumbant Bike   Level 1   RPM 30   Watts 20   Minutes 10   NuStep   Level 2   Watts 50   Minutes 10   Arm Ergometer   Level 1   Watts 10   Minutes 10   Arm/Foot Ergometer   Level 1   Watts 12   Minutes 10   Recumbant Elliptical   Level 1   RPM 40   Watts 20   Minutes 10   Elliptical   Level 1   Speed 3   Minutes 1   REL-XR   Level 2   Watts 50   Minutes 10   Prescription Details   Frequency (times per week) 3   Duration Progress to 30 minutes of continuous aerobic without signs/symptoms of physical distress   Intensity   THRR REST +  30   Ratings of Perceived Exertion 11-15   Perceived Dyspnea 2-4   Resistance Training   Training Prescription Yes   Weight 1   Reps 10-12      Exercise Prescription Changes:     Exercise Prescription Changes      11/03/14 1500 11/06/14 1400 11/10/14 1300 11/13/14 1000 11/13/14 1400   Exercise Review   Progression    Yes Yes   Response to Exercise   Blood Pressure (Admit)    130/80 mmHg 130/80 mmHg   Blood Pressure (Exercise)    160/90 mmHg 160/90 mmHg   Blood Pressure (Exit)    142/84 mmHg 142/84 mmHg   Heart Rate (Admit)     82 bpm   Heart Rate (Exercise)     118 bpm   Heart Rate (Exit)  84 bpm   Oxygen Saturation (Admit)     92 %   Oxygen Saturation (Exercise)     88 %   Oxygen Saturation (Exit)     96 %   Rating of Perceived Exertion (Exercise)     13   Perceived Dyspnea (Exercise)     13   Intensity    THRR unchanged THRR unchanged   Progression    Continue progressive overload as per policy without signs/symptoms or physical distress. Continue progressive overload as per policy without signs/symptoms or physical distress.   Resistance Training   Training  Prescription   Yes  2 lbs. Yes Yes   Weight   2 2 2    Reps    10-12 10-12   Treadmill   MPH  1.4 1.5 1.4 1.4   Grade  0  0 0   Minutes  10  14 12    NuStep   Level 2   3 3    Watts 50   30 30   Minutes 10   15 15    Recumbant Elliptical   Level  1  1 1.5   RPM  30  20 20    Watts     12   Minutes  10  10 10       Discharge Exercise Prescription:    Nutrition:  Target Goals: Understanding of nutrition guidelines, daily intake of sodium <1521m, cholesterol <2096m calories 30% from fat and 7% or less from saturated fats, daily to have 5 or more servings of fruits and vegetables.  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 Participant is able to verbalize types and ability to use techniques and skills needed for reducing stress and depression.  Initial Review & Psychosocial Screening:     Initial Psych Review & Screening - 10/31/14 0900    Initial Review   Current issues with Current Depression;Current Sleep Concerns;Current Stress Concerns   Source of Stress Concerns Chronic Illness;Unable to participate in former interests or hobbies   FaFree UnionYes   Barriers   Psychosocial barriers to participate in program There are no identifiable barriers or psychosocial needs.;The patient should benefit from training  in stress management and relaxation.   Screening Interventions   Interventions Encouraged to exercise;Program counselor consult      Quality of Life Scores:     Quality of Life - 10/31/14 0900    Quality of Life Scores   Health/Function Pre 11.72 %   Socioeconomic Pre 8.5 %   Psych/Spiritual Pre 16.06 %   Family Pre 10.07 %   GLOBAL Pre 17.4 %      PHQ-9:     Recent Review Flowsheet Data    Depression screen PHQ  2/9 10/31/2014   Decreased Interest 1   Down, Depressed, Hopeless 1   PHQ - 2 Score 2   Altered sleeping 0   Tired, decreased energy 3   Change in appetite 3   Feeling bad or failure about yourself  3   Trouble concentrating 1   Moving slowly or fidgety/restless 2   Suicidal thoughts 1   PHQ-9 Score 15   Difficult doing work/chores Somewhat difficult      Psychosocial Evaluation and Intervention:     Psychosocial Evaluation - 11/08/14 1627    Psychosocial Evaluation & Interventions   Interventions Stress management education;Encouraged to exercise with the program and follow exercise prescription;Relaxation education   Comments Counselor met with Mr. Loken today for initial psychosocial evaluation.  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  He lives with a spouse of 32 years and several friends whom he refers to as part of his support system.  Mr. Miguel Palmer has COPD and several other health issues that are impacting his recovery from pneumonia, including Narcolepsy, and Sleep Apnea, with Mr. Miguel Palmer reporting he doesn't use his CPAP to sleep as it is not user friendly.  Counselor reviewed the PHQ-9 with a score of "17" with Mr. Miguel Palmer confirming all his answers are current and accurate.  When assessed for depression, he denies a history and states any current symptoms are "situational" due to his health issues primarily.  Mr. Miguel Palmer also reports current stressors are health, financial, conflict with his daughter, a close cousin with pancreatic cancer and  his inability to do the things that used to bring him pleasure, such as playing the guitar/banjo with friends and playing his harmonica.  Mr. Colman Cater goals for this program are to breathe better, lose some weight, increase his physical strength and be stronger and more able to walk/go shopping without being "tied to this big oxygen tank."  Counselor discussed Mr. Miguel Palmer mentioning to his Dr. for a medication evaluation for his depressive symptoms and possibly seeing a therpist for support currently.  He preferred to wait to see if this program helps him feel better/stronger first.  Counselor will re-assess over the next few weeks and make further recommendations at that time.      Continued Psychosocial Services Needed Yes  Mr. O will benefit from all the educational components of this program, and participated in Relaxation education on this date.  He will benefit from meeting with a dietician, and attending the depression and stress management educational components.      Psychosocial Re-Evaluation:  Education: Education Goals: Education classes will be provided on a weekly basis, covering required topics. Participant will state understanding/return demonstration of topics presented.  Learning Barriers/Preferences:     Learning Barriers/Preferences - 10/31/14 1128    Learning Barriers/Preferences   Learning Barriers None   Learning Preferences Group Instruction;Individual Instruction;Pictoral;Written Material;Video;Verbal Instruction;Skilled Demonstration      Education Topics: Initial Evaluation Education: - Verbal, written and demonstration of respiratory meds, RPE/PD scales, oximetry and breathing techniques. Instruction on use of nebulizers and MDIs: cleaning and proper use, rinsing mouth with steroid doses and importance of monitoring MDI activations.   General Nutrition Guidelines/Fats and Fiber: -Group instruction provided by verbal, written material, models and posters to present the  general guidelines for heart healthy nutrition. Gives an explanation and review of dietary fats and fiber.   Controlling Sodium/Reading Food Labels: -Group verbal and written material supporting the discussion of sodium use in heart healthy  nutrition. Review and explanation with models, verbal and written materials for utilization of the food label.   Exercise Physiology & Risk Factors: - Group verbal and written instruction with models to review the exercise physiology of the cardiovascular system and associated critical values. Details cardiovascular disease risk factors and the goals associated with each risk factor.   Aerobic Exercise & Resistance Training: - Gives group verbal and written discussion on the health impact of inactivity. On the components of aerobic and resistive training programs and the benefits of this training and how to safely progress through these programs.   Flexibility, Balance, General Exercise Guidelines: - Provides group verbal and written instruction on the benefits of flexibility and balance training programs. Provides general exercise guidelines with specific guidelines to those with heart or lung disease. Demonstration and skill practice provided.   Stress Management: - Provides group verbal and written instruction about the health risks of elevated stress, cause of high stress, and healthy ways to reduce stress.   Depression: - Provides group verbal and written instruction on the correlation between heart/lung disease and depressed mood, treatment options, and the stigmas associated with seeking treatment.   Exercise & Equipment Safety: - Individual verbal instruction and demonstration of equipment use and safety with use of the equipment.   Infection Prevention: - Provides verbal and written material to individual with discussion of infection control including proper hand washing and proper equipment cleaning during exercise session.   Falls  Prevention: - Provides verbal and written material to individual with discussion of falls prevention and safety.   Diabetes: - Individual verbal and written instruction to review signs/symptoms of diabetes, desired ranges of glucose level fasting, after meals and with exercise. Advice that pre and post exercise glucose checks will be done for 3 sessions at entry of program.   Chronic Lung Diseases: - Group verbal and written instruction to review new updates, new respiratory medications, new advancements in procedures and treatments. Provide informative websites and "800" numbers of self-education.   Lung Procedures: - Group verbal and written instruction to describe testing methods done to diagnose lung disease. Review the outcome of test results. Describe the treatment choices: Pulmonary Function Tests, ABGs and oximetry.   Energy Conservation: - Provide group verbal and written instruction for methods to conserve energy, plan and organize activities. Instruct on pacing techniques, use of adaptive equipment and posture/positioning to relieve shortness of breath.   Triggers: - Group verbal and written instruction to review types of environmental controls: home humidity, furnaces, filters, dust mite/pet prevention, HEPA vacuums. To discuss weather changes, air quality and the benefits of nasal washing.   Exacerbations: - Group verbal and written instruction to provide: warning signs, infection symptoms, calling MD promptly, preventive modes, and value of vaccinations. Review: effective airway clearance, coughing and/or vibration techniques. Create an Sports administrator.   Oxygen: - Individual and group verbal and written instruction on oxygen therapy. Includes supplement oxygen, available portable oxygen systems, continuous and intermittent flow rates, oxygen safety, concentrators, and Medicare reimbursement for oxygen.   Respiratory Medications: - Group verbal and written instruction to  review medications for lung disease. Drug class, frequency, complications, importance of spacers, rinsing mouth after steroid MDI's, and proper cleaning methods for nebulizers.   AED/CPR: - Group verbal and written instruction with the use of models to demonstrate the basic use of the AED with the basic ABC's of resuscitation.   Breathing Retraining: - Provides individuals verbal and written instruction on purpose, frequency, and proper technique of  diaphragmatic breathing and pursed-lipped breathing. Applies individual practice skills.   Anatomy and Physiology of the Lungs: - Group verbal and written instruction with the use of models to provide basic lung anatomy and physiology related to function, structure and complications of lung disease.   Heart Failure: - Group verbal and written instruction on the basics of heart failure: signs/symptoms, treatments, explanation of ejection fraction, enlarged heart and cardiomyopathy.   Sleep Apnea: - Individual verbal and written instruction to review Obstructive Sleep Apnea. Review of risk factors, methods for diagnosing and types of masks and machines for OSA.   Anxiety: - Provides group, verbal and written instruction on the correlation between heart/lung disease and anxiety, treatment options, and management of anxiety.   Relaxation: - Provides group, verbal and written instruction about the benefits of relaxation for patients with heart/lung disease. Also provides patients with examples of relaxation techniques.   Knowledge Questionnaire Score:     Knowledge Questionnaire Score - 10/31/14 1129    Knowledge Questionnaire Score   Pre Score -5      Personal Goals and Risk Factors at Admission:     Personal Goals and Risk Factors at Admission - 10/31/14 1144    Personal Goals and Risk Factors on Admission    Weight Management Yes   Intervention Learn and follow the exercise and diet guidelines while in the program. Utilize the  nutrition and education classes to help gain knowledge of the diet and exercise expectations in the program   Admit Weight 342 lb (155.13 kg)   Goal Weight 210 lb (95.255 kg)   Increase Aerobic Exercise and Physical Activity Yes   Intervention While in program, learn and follow the exercise prescription taught. Start at a low level workload and increase workload after able to maintain previous level for 30 minutes. Increase time before increasing intensity.   Understand more about Heart/Pulmonary Disease. Yes   Intervention While in program utilize professionals for any questions, and attend the education sessions. Great websites to use are www.americanheart.org or www.lung.org for reliable information.   Improve shortness of breath with ADL's Yes   Intervention While in program, learn and follow the exercise prescription taught. Start at a low level workload and increase workload ad advised by the exercise physiologist. Increase time before increasing intensity.   Develop more efficient breathing techniques such as purse lipped breathing and diaphragmatic breathing; and practicing self-pacing with activity Yes   Intervention While in program, learn and utilize the specific breathing techniques taught to you. Continue to practice and use the techniques as needed.   Increase knowledge of respiratory medications and ability to use respiratory devices properly.  Yes   Diabetes Yes   Goal Blood glucose control identified by blood glucose values, HgbA1C. Participant verbalizes understanding of the signs/symptoms of hyper/hypo glycemia, proper foot care and importance of medication and nutrition plan for blood glucose control.   Intervention Provide nutrition & aerobic exercise along with prescribed medications to achieve blood glucose in normal ranges: Fasting 65-99 mg/dL   Hypertension Yes   Goal Participant will see blood pressure controlled within the values of 140/55m/Hg or within value directed by  their physician.   Intervention Provide nutrition & aerobic exercise along with prescribed medications to achieve BP 140/90 or less.   Lipids Yes   Goal Cholesterol controlled with medications as prescribed, with individualized exercise RX and with personalized nutrition plan. Value goals: LDL < 73m HDL > 4058mParticipant states understanding of desired cholesterol values and following prescriptions.  Intervention Provide nutrition & aerobic exercise along with prescribed medications to achieve LDL <75m, HDL >443m   Stress Yes   Goal To meet with psychosocial counselor for stress and relaxation information and guidance. To state understanding of performing relaxation techniques and or identifying personal stressors.   Intervention Provide education on types of stress, identifiying stressors, and ways to cope with stress. Provide demonstration and active practice of relaxation techniques.   Personal Goal Other Yes   Personal Goal "I want to sing without shortness of breath."   Intervention Other  Reduce shortness of breath with exercise and pursed- lip breathing   Intervention While in program learn and demonstrate appropriate use of your oxygen therapy by increasing flow with exertion, manage oxygen tank operation, including continuous and intermittent flow.  Understanding oxygen is a drug ordered by your physician.      Personal Goals and Risk Factors Review:    Personal Goals Discharge:    Comments: 30 day review

## 2014-11-13 NOTE — Progress Notes (Signed)
Daily Session Note  Patient Details  Name: Miguel Palmer MRN: [id number] Date of Birth: 1940-08-11 Referring Provider:  [name], MD  Encounter Date: 11/13/2014  Check In:     Session Check In - 11/13/14 1256    Check-In   Staff Present Frederich Balding MPH, CHES;Susanne Bice RN, BSN, CCRP;Laure[employer], RRT, Respiratory Therapist;Daesia Zylka RN, BSN   ER physicians immediately available to respond to emergencies [facility] immediately available ER MD   Physician(s) Dr. Corky Downs Dr. Reita Cliche   Medication changes reported     No   Fall or balance concerns reported    No   Warm-up and Cool-down Performed on first and last piece of equipment   VAD Patient? No   Pain Assessment   Currently in Pain? No/denies           Exercise Prescription Changes - 11/13/14 1000    Exercise Review   Progression Yes   Response to Exercise   Blood Pressure (Admit) 130/80 mmHg   Blood Pressure (Exercise) 160/90 mmHg   Blood Pressure (Exit) 142/84 mmHg   Intensity THRR unchanged   Progression Continue progressive overload as per policy without signs/symptoms or physical distress.   Resistance Training   Training Prescription Yes   Weight 2   Reps 10-12   Treadmill   MPH 1.4   Grade 0   Minutes 14   NuStep   Level 3   Watts 30   Minutes 15   Recumbant Elliptical   Level 1   RPM 20   Minutes 10      Goals Met:  Proper associated with RPD/PD & O2 Sat Personal goals reviewed  Goals Unmet:  Not Applicable  Goals Comments:    Dr. Emily Filbert is Medical Director for [facility] and [facility].

## 2014-11-13 NOTE — Progress Notes (Signed)
Daily Session Note  Patient Details  Name: Miguel Palmer MRN: [id number] Date of Birth: 10-09-1940 Referring Provider:  [name], MD  Encounter Date: 11/13/2014  Check In:     Session Check In - 11/13/14 1256    Check-In   Staff Present Frederich Balding MPH, CHES;Susanne Bice RN, BSN, CCRP;Laureen [employer], RRT, Respiratory Therapist;Carroll Enterkin RN, BSN   ER physicians immediately available to respond to emergencies [facility] immediately available ER MD   Physician(s) Dr. Corky Downs Dr. Reita Cliche   Medication changes reported     No   Fall or balance concerns reported    No   Warm-up and Cool-down Performed on first and last piece of equipment   VAD Patient? No   Pain Assessment   Currently in Pain? No/denies           Exercise Prescription Changes - 11/13/14 1400    Exercise Review   Progression Yes   Response to Exercise   Blood Pressure (Admit) 130/80 mmHg   Blood Pressure (Exercise) 160/90 mmHg   Blood Pressure (Exit) 142/84 mmHg   Heart Rate (Admit) 82 bpm   Heart Rate (Exercise) 118 bpm   Heart Rate (Exit) 84 bpm   Oxygen Saturation (Admit) 92 %   Oxygen Saturation (Exercise) 88 %   Oxygen Saturation (Exit) 96 %   Rating of Perceived Exertion (Exercise) 13   Perceived Dyspnea (Exercise) 13   Intensity THRR unchanged   Progression Continue progressive overload as per policy without signs/symptoms or physical distress.   Resistance Training   Training Prescription Yes   Weight 2   Reps 10-12   Treadmill   MPH 1.4   Grade 0   Minutes 12   NuStep   Level 3   Watts 30   Minutes 15   Recumbant Elliptical   Level 1.5   RPM 20   Watts 12   Minutes 10      Goals Met:  Proper associated with RPD/PD & O2 Sat Exercise tolerated well Strength training completed today  Goals Unmet:  Not Applicable  Goals Comments: Working with Mr Virden and oxygen education; worked on pacing and stopping when O2Sat's dropped to mid 42's; increased oxygen to 6l/m for  improved O2Sat's of 90%. Plan to contact Dr Caryl Comes for a new oxygen order.   Dr. Emily Filbert is Medical Director for [place] and [facility].

## 2014-11-13 NOTE — Progress Notes (Signed)
Daily Session Note  Patient Details  Name: Miguel Palmer MRN: [id number] Date of Birth: April 15, 1941 Referring Provider:  [name], MD  Encounter Date: 11/13/2014  Check In:     Session Check In - 11/13/14 1256    Check-In   Staff Present Frederich Balding MPH, CHES;Malachi Kinzler RN, BSN, CCRP;Laureen [employer], RRT, Respiratory Therapist;Carroll Enterkin RN, BSN   ER physicians immediately available to respond to emergencies [facility] immediately available ER MD   Physician(s) Dr. Corky Downs Dr. Reita Cliche   Medication changes reported     No   Fall or balance concerns reported    No   Warm-up and Cool-down Performed on first and last piece of equipment   VAD Patient? No   Pain Assessment   Currently in Pain? No/denies           Exercise Prescription Changes - 11/13/14 1400    Exercise Review   Progression Yes   Response to Exercise   Blood Pressure (Admit) 130/80 mmHg   Blood Pressure (Exercise) 160/90 mmHg   Blood Pressure (Exit) 142/84 mmHg   Heart Rate (Admit) 82 bpm   Heart Rate (Exercise) 118 bpm   Heart Rate (Exit) 84 bpm   Oxygen Saturation (Admit) 92 %   Oxygen Saturation (Exercise) 88 %   Oxygen Saturation (Exit) 96 %   Rating of Perceived Exertion (Exercise) 13   Perceived Dyspnea (Exercise) 13   Intensity THRR unchanged   Progression Continue progressive overload as per policy without signs/symptoms or physical distress.   Resistance Training   Training Prescription Yes   Weight 2   Reps 10-12   Treadmill   MPH 1.4   Grade 0   Minutes 12   NuStep   Level 3   Watts 30   Minutes 15   Recumbant Elliptical   Level 1.5   RPM 20   Watts 12   Minutes 10      Goals Met:  Exercise tolerated well Personal goals reviewed Strength training completed today  Goals Unmet:  Not Applicable  Goals Comments:    Dr. Emily Filbert is Medical Director for [place] and [facility].

## 2014-11-15 ENCOUNTER — Encounter: Payer: Medicare Other | Admitting: *Deleted

## 2014-11-15 DIAGNOSIS — J449 Chronic obstructive pulmonary disease, unspecified: Secondary | ICD-10-CM | POA: Diagnosis not present

## 2014-11-15 DIAGNOSIS — G4733 Obstructive sleep apnea (adult) (pediatric): Secondary | ICD-10-CM

## 2014-11-15 NOTE — Progress Notes (Signed)
Daily Session Note  Patient Details  Name: Miguel Palmer MRN: [id number] Date of Birth: [birth year] Referring Provider:  [name], MD  Encounter Date: 11/15/2014  Check In:     Session Check In - 11/15/14 1203    Check-In   Staff Present Candiss Norse MS, ACSM CEP Exercise Physiologist;Carroll Enterkin RN, BSN;Steven Way BS, ACSM EP-C, Exercise Physiologist;Laureen Janell Quiet, RRT, Respiratory Therapist   ER physicians immediately available to respond to emergencies [facility] immediately available ER MD   Physician(s) Cinda Quest and Jimmye Norman   Medication changes reported     No   Fall or balance concerns reported    No   Warm-up and Cool-down Performed on first and last piece of equipment   VAD Patient? No   Pain Assessment   Currently in Pain? No/denies   Multiple Pain Sites No         Goals Met:  Proper associated with RPD/PD & O2 Sat Independence with exercise equipment Using PLB without cueing & demonstrates good technique Exercise tolerated well Strength training completed today  Goals Unmet:  Not Applicable  Goals Comments:   Dr. Emily Filbert is Medical Director for [place] and [facility].

## 2014-11-17 ENCOUNTER — Encounter: Payer: Medicare Other | Admitting: *Deleted

## 2014-11-17 DIAGNOSIS — J449 Chronic obstructive pulmonary disease, unspecified: Secondary | ICD-10-CM | POA: Diagnosis not present

## 2014-11-17 DIAGNOSIS — G4733 Obstructive sleep apnea (adult) (pediatric): Secondary | ICD-10-CM

## 2014-11-17 NOTE — Progress Notes (Signed)
Daily Session Note  Patient Details  Name: Miguel Palmer MRN: [id number] Date of Birth: 1941/06/12 Referring Provider:  [name], MD  Encounter Date: 11/17/2014  Check In:     Session Check In - 11/17/14 1202    Check-In   Staff Present Gerlene Burdock RN, BSN;Novelle Addair Dillard Essex MS, ACSM CEP Exercise Physiologist;Stacey Blanch Media RRT, RCP Respiratory Therapist   ER physicians immediately available to respond to emergencies [facility] immediately available ER MD   Physician(s) Corky Downs and Lord   Medication changes reported     No   Fall or balance concerns reported    No   Warm-up and Cool-down Performed on first and last piece of equipment   VAD Patient? No   Pain Assessment   Currently in Pain? No/denies   Multiple Pain Sites No         Goals Met:  Proper associated with RPD/PD & O2 Sat Independence with exercise equipment Using PLB without cueing & demonstrates good technique Exercise tolerated well Strength training completed today  Goals Unmet:  Not Applicable  Goals Comments:    Dr. Emily Filbert is Medical Director for [place] and [facility].

## 2014-11-21 NOTE — Progress Notes (Signed)
Daily Session Note  Patient Details  Name: Hakeem Frazzini MRN: [id number] Date of Birth: July 02, 1940 Referring Provider:  [name], MD  Encounter Date: 11/17/2014  Check In:      Goals Met:  adding an order change to oxygen  Goals Unmet:  Not Applicable  Goals Comments: Dr [name] changed Mr Aamodt's oxygen order to 3 l/m rest and 6 l/m activity.   Dr. Emily Filbert is Medical Director for [place] and [facility].

## 2014-11-22 ENCOUNTER — Encounter: Payer: Medicare Other | Attending: Family Medicine

## 2014-11-22 DIAGNOSIS — J449 Chronic obstructive pulmonary disease, unspecified: Secondary | ICD-10-CM | POA: Insufficient documentation

## 2014-11-29 ENCOUNTER — Encounter: Payer: Medicare Other | Admitting: *Deleted

## 2014-11-29 DIAGNOSIS — J449 Chronic obstructive pulmonary disease, unspecified: Secondary | ICD-10-CM | POA: Diagnosis present

## 2014-11-29 DIAGNOSIS — G4733 Obstructive sleep apnea (adult) (pediatric): Secondary | ICD-10-CM

## 2014-11-29 NOTE — Progress Notes (Signed)
Daily Session Note  Patient Details  Name: Miguel Palmer MRN: [id number] Date of Birth: 25-Sep-1940 Referring Provider:  [name], MD  Encounter Date: 11/29/2014  Check In:     Session Check In - 11/29/14 1156    Check-In   Staff Present Lestine Box BS, ACSM EP-C, Exercise Physiologist;Synthia Fairbank Dillard Essex MS, ACSM CEP Exercise Physiologist;Laureen Janell Quiet, RRT, Respiratory Therapist   ER physicians immediately available to respond to emergencies [facility] immediately available ER MD   Physician(s) Claudette Head and Archie Balboa   Medication changes reported     No   Fall or balance concerns reported    No   Warm-up and Cool-down Performed on first and last piece of equipment   VAD Patient? No   Pain Assessment   Currently in Pain? No/denies   Multiple Pain Sites No         Goals Met:  Proper associated with RPD/PD & O2 Sat Independence with exercise equipment Using PLB without cueing & demonstrates good technique Exercise tolerated well Personal goals reviewed Strength training completed today  Goals Unmet:  Not Applicable  Goals Comments:   Dr. Emily Filbert is Medical Director for [place] and [facility].

## 2014-12-01 ENCOUNTER — Encounter: Payer: Medicare Other | Admitting: *Deleted

## 2014-12-01 DIAGNOSIS — J449 Chronic obstructive pulmonary disease, unspecified: Secondary | ICD-10-CM

## 2014-12-01 NOTE — Progress Notes (Signed)
Daily Session Note  Patient Details  Name: Giankarlo Leamer MRN: [id number] Date of Birth: 06-12-1941 Referring Provider:  [name], MD  Encounter Date: 12/01/2014  Check In:     Session Check In - 12/01/14 1247    Check-In   Staff Present Candiss Norse MS, ACSM CEP Exercise Physiologist;Stacey Blanch Media RRT, RCP Respiratory Therapist;Naileah Karg RN, BSN   ER physicians immediately available to respond to emergencies [facility] immediately available ER MD   Physician(s) Dr. Jacqualine Code, and Dr. Jimmye Norman   Medication changes reported     No   Fall or balance concerns reported    No   Warm-up and Cool-down Performed on first and last piece of equipment   VAD Patient? No   Pain Assessment   Currently in Pain? No/denies         Goals Met:  Proper associated with RPD/PD & O2 Sat Exercise tolerated well  Goals Unmet:  Not Applicable  Goals Comments:    Dr. Emily Filbert is Medical Director for [place] and [facility].

## 2014-12-06 ENCOUNTER — Encounter: Payer: Medicare Other | Admitting: *Deleted

## 2014-12-06 DIAGNOSIS — J449 Chronic obstructive pulmonary disease, unspecified: Secondary | ICD-10-CM | POA: Diagnosis not present

## 2014-12-06 NOTE — Progress Notes (Signed)
Daily Session Note  Patient Details  Name: Miguel Palmer MRN: [id number] Date of Birth: Jun 27, 1940 Referring Provider:  [name], MD  Encounter Date: 12/06/2014  Check In:     Session Check In - 12/06/14 1205    Check-In   Staff Present Carson Myrtle BS, RRT, Respiratory Therapist;Halvor Behrend RN, BSN;Steven Way BS, ACSM EP-C, Exercise Physiologist   ER physicians immediately available to respond to emergencies [facility] immediately available ER MD   Physician(s) Dr. Edd Fabian and Dr. Reita Cliche   Medication changes reported     No   Warm-up and Cool-down Performed on first and last piece of equipment   VAD Patient? No   Pain Assessment   Currently in Pain? No/denies           Exercise Prescription Changes - 12/06/14 1200    Exercise Review   Progression Yes   Response to Exercise   Blood Pressure (Admit) 130/80 mmHg   Blood Pressure (Exercise) 160/90 mmHg   Blood Pressure (Exit) 142/84 mmHg   Heart Rate (Admit) 82 bpm   Heart Rate (Exercise) 118 bpm   Heart Rate (Exit) 84 bpm   Oxygen Saturation (Admit) 92 %   Oxygen Saturation (Exercise) 88 %   Oxygen Saturation (Exit) 96 %   Rating of Perceived Exertion (Exercise) 13   Perceived Dyspnea (Exercise) 13   Intensity THRR unchanged   Progression Continue progressive overload as per policy without signs/symptoms or physical distress.   Resistance Training   Training Prescription Yes   Weight 2   Reps 10-12   Treadmill   MPH 1.4   Grade 0   Minutes 12   NuStep   Level 3   Watts 30   Minutes 15   Recumbant Elliptical   Level 2   RPM 20   Watts 15   Minutes 10      Goals Met:  Proper associated with RPD/PD & O2 Sat Exercise tolerated well  Goals Unmet:  Not Applicable  Goals Comments:    Dr. Emily Filbert is Medical Director for [place] and [facility].

## 2014-12-08 ENCOUNTER — Encounter: Payer: Medicare Other | Admitting: *Deleted

## 2014-12-08 DIAGNOSIS — G4733 Obstructive sleep apnea (adult) (pediatric): Secondary | ICD-10-CM

## 2014-12-08 DIAGNOSIS — J449 Chronic obstructive pulmonary disease, unspecified: Secondary | ICD-10-CM

## 2014-12-08 NOTE — Progress Notes (Signed)
Daily Session Note  Patient Details  Name: Miguel Palmer MRN: [id number] Date of Birth: April 01, 1941 Referring Provider:  [name], MD  Encounter Date: 12/08/2014  Check In:     Session Check In - 12/08/14 1151    Check-In   Staff Present Candiss Norse MS, ACSM CEP Exercise Physiologist;Stacey Blanch Media RRT, RCP Respiratory Therapist;Kelly Alfonso Patten, ACSM CEP Exercise Physiologist   ER physicians immediately available to respond to emergencies [facility] immediately available ER MD   Physician(s) Dr. Corky Downs and Dr. Edd Fabian    Medication changes reported     No   Fall or balance concerns reported    No   Warm-up and Cool-down Performed on first and last piece of equipment   VAD Patient? No   Pain Assessment   Currently in Pain? No/denies         Goals Met:  Proper associated with RPD/PD & O2 Sat Exercise tolerated well  Goals Unmet:  Not Applicable  Goals Comments: TM increased to 1.5 mph   Dr. Emily Filbert is Medical Director for [place] and [facility].

## 2014-12-11 ENCOUNTER — Encounter: Payer: Medicare Other | Admitting: *Deleted

## 2014-12-11 DIAGNOSIS — J449 Chronic obstructive pulmonary disease, unspecified: Secondary | ICD-10-CM | POA: Diagnosis not present

## 2014-12-11 NOTE — Progress Notes (Signed)
Pulmonary Individual Treatment Plan  Patient Details  Name: Jmichael Gille MRN: [id number] Date of Birth: [birth year] Referring Provider:  [name], MD  Initial Encounter Date:    Visit Diagnosis: Chronic obstructive pulmonary disease, unspecified COPD, unspecified chronic bronchitis type  Patient's Home Medications on Admission:  Current outpatient prescriptions:    aspirin 81 MG tablet, Take 81 mg by mouth every other day., Disp: , Rfl:    co-enzyme Q-10 30 MG capsule, Take 30 mg by mouth 2 (two) times daily., Disp: , Rfl:    dextroamphetamine (DEXEDRINE SPANSULE) 15 MG 24 hr capsule, Take 15 mg by mouth daily., Disp: , Rfl:    gabapentin (NEURONTIN) 400 MG capsule, Take by mouth., Disp: , Rfl:    Ginkgo Biloba 60 MG TABS, Take by mouth., Disp: , Rfl:    Glucosamine Sulfate 1000 MG CAPS, Take by mouth., Disp: , Rfl:    isosorbide mononitrate (IMDUR) 30 MG 24 hr tablet, TAKE 1 TABLET BY MOUTH EVERY DAY, Disp: , Rfl:    metFORMIN (GLUCOPHAGE) 500 MG tablet, TAKE 1 TABLET BY MOUTH DAILY., Disp: , Rfl:    metoprolol succinate (TOPROL-XL) 25 MG 24 hr tablet, TAKE 1 TABLET BY MOUTH EVERY DAY, Disp: , Rfl:    modafinil (PROVIGIL) 200 MG tablet, Take by mouth., Disp: , Rfl:    Multiple Vitamins-Minerals (MULTIVITAMIN & MINERAL PO), Take by mouth., Disp: , Rfl:    niacin (NIASPAN) 1000 MG CR tablet, Take by mouth., Disp: , Rfl:    nitroGLYCERIN (NITROSTAT) 0.4 MG SL tablet, Place under the tongue., Disp: , Rfl:    prasugrel (EFFIENT) 10 MG TABS tablet, TAKE 1 TABLET BY MOUTH EVERY DAY, Disp: , Rfl:    rosuvastatin (CRESTOR) 20 MG tablet, Take by mouth., Disp: , Rfl:    tiotropium (SPIRIVA) 18 MCG inhalation capsule, Place into inhaler and inhale., Disp: , Rfl:   Past Medical History: No past medical history on file.  Tobacco Use: History  Smoking status   Former Smoker -- 2.00 packs/day for 22 years   Types: Cigarettes  Smokeless tobacco   Former Systems developer    Quit date: 09/03/1980    Labs: Recent Review Flowsheet Data    There is no flowsheet data to display.         POCT Glucose      11/03/14 1524 11/06/14 1449 11/08/14 1130       POCT Blood Glucose   Pre-Exercise 100 mg/dL 100 mg/dL      Post-Exercise 85 mg/dL 85 mg/dL      Pre-Exercise #2  102 mg/dL      Post-Exercise #2  95 mg/dL  Peanut butter crackers given for after class on the way to lunch      Pre-Exercise #3   96 mg/dL     Post-Exercise #3   99 mg/dL        ADL UCSD:     ADL UCSD      10/31/14 1131       ADL UCSD   ADL Phase Entry     SOB Score total 75     Rest 1     Walk 1     Stairs 4     Bath 3     Dress 3     Shop 3         Pulmonary Function Assessment:     Pulmonary Function Assessment - 10/31/14 1129    Pulmonary Function Tests   FVC% 56 %   FEV1%  61 %   FEV1/FVC Ratio 83.86   RV% 64 %   DLCO% 45 %   Breath   Bilateral Breath Sounds Clear   Shortness of Breath No;Yes      Exercise Target Goals:    Exercise Program Goal: Individual exercise prescription set with THRR, safety & activity barriers. Participant demonstrates ability to understand and report RPE using BORG scale, to self-measure pulse accurately, and to acknowledge the importance of the exercise prescription.  Exercise Prescription Goal: Starting with aerobic activity 30 plus minutes a day, 3 days per week for initial exercise prescription. Provide home exercise prescription and guidelines that participant acknowledges understanding prior to discharge.  Activity Barriers & Risk Stratification:     Activity Barriers & Risk Stratification - 10/31/14 1128    Activity Barriers & Risk Stratification   Activity Barriers None      6 Minute Walk:     6 Minute Walk      10/31/14 1702       6 Minute Walk   Phase Initial     Distance 940 feet     Walk Time 6 minutes     Resting HR 73 bpm     Resting BP 128/76 mmHg     Max Ex. HR 131 bpm     Max Ex. BP 162/84  mmHg     RPE 15     Perceived Dyspnea  4     Symptoms No        Initial Exercise Prescription:     Initial Exercise Prescription - 10/31/14 1700    Date of Initial Exercise Prescription   Date 10/31/14   Treadmill   MPH 1.4   Grade 0   Minutes 10   Bike   Level 1   Watts 30   Minutes 10   Recumbant Bike   Level 1   RPM 30   Watts 20   Minutes 10   NuStep   Level 2   Watts 50   Minutes 10   Arm Ergometer   Level 1   Watts 10   Minutes 10   Arm/Foot Ergometer   Level 1   Watts 12   Minutes 10   Recumbant Elliptical   Level 1   RPM 40   Watts 20   Minutes 10   Elliptical   Level 1   Speed 3   Minutes 1   REL-XR   Level 2   Watts 50   Minutes 10   Prescription Details   Frequency (times per week) 3   Duration Progress to 30 minutes of continuous aerobic without signs/symptoms of physical distress   Intensity   THRR REST +  30   Ratings of Perceived Exertion 11-15   Perceived Dyspnea 2-4   Resistance Training   Training Prescription Yes   Weight 1   Reps 10-12      Exercise Prescription Changes:     Exercise Prescription Changes      11/03/14 1500 11/06/14 1400 11/10/14 1300 11/13/14 1000 11/13/14 1400   Exercise Review   Progression    Yes Yes   Response to Exercise   Blood Pressure (Admit)    130/80 mmHg 130/80 mmHg   Blood Pressure (Exercise)    160/90 mmHg 160/90 mmHg   Blood Pressure (Exit)    142/84 mmHg 142/84 mmHg   Heart Rate (Admit)     82 bpm   Heart Rate (Exercise)     118 bpm  Heart Rate (Exit)     84 bpm   Oxygen Saturation (Admit)     92 %   Oxygen Saturation (Exercise)     88 %   Oxygen Saturation (Exit)     96 %   Rating of Perceived Exertion (Exercise)     13   Perceived Dyspnea (Exercise)     13   Intensity    THRR unchanged THRR unchanged   Progression    Continue progressive overload as per policy without signs/symptoms or physical distress. Continue progressive overload as per policy without signs/symptoms or  physical distress.   Resistance Training   Training Prescription   Yes  2 lbs. Yes Yes   Weight   _0 Reps    10-12 10-12   Treadmill   MPH  1.4 1.5 1.4 1.4   Grade  0  0 0   Minutes  _1 NuStep   Level _2 Watts 50   30 30   Minutes _3 Recumbant Elliptical   Level  1  1 1.5   RPM  _4 Watts     12   Minutes  _5 12/06/14 1200 12/06/14 1500 12/08/14 1400 12/11/14 1200     Exercise Review   Progression Yes Yes  Yes    Response to Exercise   Blood Pressure (Admit) 130/80 mmHg 148/80 mmHg      Blood Pressure (Exercise) 160/90 mmHg 142/74 mmHg      Blood Pressure (Exit) 142/84 mmHg 140/80 mmHg      Heart Rate (Admit) 82 bpm 95 bpm      Heart Rate (Exercise) 118 bpm 118 bpm      Heart Rate (Exit) 84 bpm 95 bpm      Oxygen Saturation (Admit) 92 % 94 %      Oxygen Saturation (Exercise) 88 % 90 %  6l/m continuous      Oxygen Saturation (Exit) 96 % 97 %      Rating of Perceived Exertion (Exercise) 13 13      Perceived Dyspnea (Exercise) 13 4      Intensity THRR unchanged       Progression Continue progressive overload as per policy without signs/symptoms or physical distress.       Resistance Training   Training Prescription Yes       Weight 2       Reps 10-12       Treadmill   MPH 1.4  1.5 1.5    Grade 0  0 0    Minutes _6 NuStep   Level 3   5    Watts 30   45    Minutes 15       Recumbant Elliptical   Level 2   3    RPM 20   60    Watts 15       Minutes 10          Discharge Exercise Prescription (Final Exercise Prescription Changes):     Exercise Prescription Changes - 12/11/14 1200    Exercise Review   Progression Yes   Treadmill   MPH 1.5   Grade 0   Minutes 12   NuStep   Level 5   Watts 45   Recumbant Elliptical   Level 3  RPM 60       Nutrition:  Target Goals: Understanding of nutrition guidelines, daily intake of sodium <1511m, cholesterol <2051m calories 30% from fat and 7% or  less from saturated fats, daily to have 5 or more servings of fruits and vegetables.  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 Participant is able to verbalize types and ability to use techniques and skills needed for reducing stress and depression.  Initial Review & Psychosocial Screening:     Initial Psych Review & Screening - 10/31/14 0900    Initial Review   Current issues with Current Depression;Current Sleep Concerns;Current Stress Concerns   Source of Stress Concerns Chronic Illness;Unable to participate in former interests or hobbies   FaFriendlyYes   Barriers   Psychosocial barriers to participate in program There are no identifiable barriers or psychosocial needs.;The patient should benefit from training in stress management and relaxation.   Screening Interventions   Interventions Encouraged to exercise;Program counselor consult      Quality of Life Scores:     Quality of Life - 10/31/14 0900    Quality of Life Scores   Health/Function Pre 11.72 %   Socioeconomic Pre 8.5 %   Psych/Spiritual Pre 16.06 %   Family Pre 10.07 %   GLOBAL Pre 17.4 %      PHQ-9:     Recent Review Flowsheet Data    [facility]   Decreased Interest 1   Down, Depressed, Hopeless 1   PHQ - 2  Score 2   Altered sleeping 0   Tired, decreased energy 3   Change in appetite 3   Feeling bad or failure about yourself  3   Trouble concentrating 1   Moving slowly or fidgety/restless 2   Suicidal thoughts 1   PHQ-9 Score 15   Difficult doing work/chores Somewhat difficult      Psychosocial Evaluation and Intervention:     Psychosocial Evaluation - 11/08/14 1627    Psychosocial Evaluation & Interventions   Interventions Stress management education;Encouraged to exercise with the program and follow exercise prescription;Relaxation education   Comments Counselor met with Mr. OlSacksoday for initial psychosocial evaluation.  He is a former high scEducation officer, [place]  He lives with a spouse of [phone] and several friends whom he refers to as part of his support system.  Mr. 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 O Jenetta Downereporting he doesn't use his CPAP to sleep as it is not user friendly.  Counselor reviewed the PHQ-9 with a score of "17" with Mr. O Jenetta Downeronfirming  all his answers are current and accurate.  When assessed for depression, he denies a history and states any current symptoms are "situational" due to his health issues primarily.  Mr. Jenetta Downer also reports current stressors are health, financial, conflict with his daughter, a close cousin with pancreatic cancer and his inability to do the things that used to bring him pleasure, such as playing the guitar/banjo with friends and playing his harmonica.  Mr. Colman Cater goals for this program are to breathe better, lose some weight, increase his physical strength and be stronger and more able to walk/go shopping without being "tied to this big oxygen tank."  Counselor discussed Mr. Jenetta Downer mentioning to his Dr. for a medication evaluation for his depressive symptoms and possibly seeing a therpist for support currently.  He preferred to wait  to see if this program helps him feel better/stronger first.  Counselor will re-assess over the next few weeks and make further recommendations at that time.      Continued Psychosocial Services Needed Yes  Mr. O will benefit from all the educational components of this program, and participated in Relaxation education on this date.  He will benefit from meeting with a dietician, and attending the depression and stress management educational components.      Psychosocial Re-Evaluation:  Education: Education Goals: Education classes will be provided on a weekly basis, covering required topics. Participant will state understanding/return demonstration of topics presented.  Learning Barriers/Preferences:     Learning Barriers/Preferences - 10/31/14 1128    Learning Barriers/Preferences   Learning Barriers None   Learning Preferences Group Instruction;Individual Instruction;Pictoral;Written Material;Video;Verbal Instruction;Skilled Demonstration      Education Topics: Initial Evaluation Education: - Verbal, written and demonstration of respiratory meds, RPE/PD scales, oximetry and breathing techniques. Instruction on use of nebulizers and MDIs: cleaning and proper use, rinsing mouth with steroid doses and importance of monitoring MDI activations.   General Nutrition Guidelines/Fats and Fiber: -Group instruction provided by verbal, written material, models and posters to present the general guidelines for heart healthy nutrition. Gives an explanation and review of dietary fats and fiber.   Controlling Sodium/Reading Food Labels: -Group verbal and written material supporting the discussion of sodium use in heart healthy nutrition. Review and explanation with models, verbal and written materials for utilization of the food label.   Exercise Physiology & Risk Factors: - Group verbal and written instruction with models to review the exercise physiology of the cardiovascular system and  associated critical values. Details cardiovascular disease risk factors and the goals associated with each risk factor.   Aerobic Exercise & Resistance Training: - Gives group verbal and written discussion on the health impact of inactivity. On the components of aerobic and resistive training programs and the benefits of this training and how to safely progress through these programs.   Flexibility, Balance, General Exercise Guidelines: - Provides group verbal and written instruction on the benefits of flexibility and balance training programs. Provides general exercise guidelines with specific guidelines to those with heart or lung disease. Demonstration and skill practice provided.   Stress Management: - Provides group verbal and written instruction about the health risks of elevated stress, cause of high stress, and healthy ways to reduce stress.   Depression: - Provides group verbal and written instruction on the correlation between heart/lung disease and depressed mood, treatment options, and the stigmas associated with seeking treatment.   Exercise & Equipment Safety: - Individual verbal instruction and demonstration of equipment use and safety with use of the equipment.  Infection Prevention: - Provides verbal and written material to individual with discussion of infection control including proper hand washing and proper equipment cleaning during exercise session.   Falls Prevention: - Provides verbal and written material to individual with discussion of falls prevention and safety.   Diabetes: - Individual verbal and written instruction to review signs/symptoms of diabetes, desired ranges of glucose level fasting, after meals and with exercise. Advice that pre and post exercise glucose checks will be done for 3 sessions at entry of program.   Chronic Lung Diseases: - Group verbal and written instruction to review new updates, new respiratory medications, new advancements  in procedures and treatments. Provide informative websites and "800" numbers of self-education.   Lung Procedures: - Group verbal and written instruction to describe testing methods done to diagnose lung disease. Review the outcome of test results. Describe the treatment choices: Pulmonary Function Tests, ABGs and oximetry.   Energy Conservation: - Provide group verbal and written instruction for methods to conserve energy, plan and organize activities. Instruct on pacing techniques, use of adaptive equipment and posture/positioning to relieve shortness of breath.   Triggers: - Group verbal and written instruction to review types of environmental controls: home humidity, furnaces, filters, dust mite/pet prevention, HEPA vacuums. To discuss weather changes, air quality and the benefits of nasal washing.   Exacerbations: - Group verbal and written instruction to provide: warning signs, infection symptoms, calling MD promptly, preventive modes, and value of vaccinations. Review: effective airway clearance, coughing and/or vibration techniques. Create an Sports administrator.   Oxygen: - Individual and group verbal and written instruction on oxygen therapy. Includes supplement oxygen, available portable oxygen systems, continuous and intermittent flow rates, oxygen safety, concentrators, and Medicare reimbursement for oxygen.   Respiratory Medications: - Group verbal and written instruction to review medications for lung disease. Drug class, frequency, complications, importance of spacers, rinsing mouth after steroid MDI's, and proper cleaning methods for nebulizers.   AED/CPR: - Group verbal and written instruction with the use of models to demonstrate the basic use of the AED with the basic ABC's of resuscitation.   Breathing Retraining: - Provides individuals verbal and written instruction on purpose, frequency, and proper technique of diaphragmatic breathing and pursed-lipped breathing.  Applies individual practice skills.   Anatomy and Physiology of the Lungs: - Group verbal and written instruction with the use of models to provide basic lung anatomy and physiology related to function, structure and complications of lung disease.   Heart Failure: - Group verbal and written instruction on the basics of heart failure: signs/symptoms, treatments, explanation of ejection fraction, enlarged heart and cardiomyopathy.   Sleep Apnea: - Individual verbal and written instruction to review Obstructive Sleep Apnea. Review of risk factors, methods for diagnosing and types of masks and machines for OSA.   Anxiety: - Provides group, verbal and written instruction on the correlation between heart/lung disease and anxiety, treatment options, and management of anxiety.   Relaxation: - Provides group, verbal and written instruction about the benefits of relaxation for patients with heart/lung disease. Also provides patients with examples of relaxation techniques.   Knowledge Questionnaire Score:     Knowledge Questionnaire Score - 10/31/14 1129    Knowledge Questionnaire Score   Pre Score -5      Personal Goals and Risk Factors at Admission:     Personal Goals and Risk Factors at Admission - 10/31/14 1144    Personal Goals and Risk Factors on Admission    Weight Management Yes  Intervention Learn and follow the exercise and diet guidelines while in the program. Utilize the nutrition and education classes to help gain knowledge of the diet and exercise expectations in the program   Admit Weight 342 lb (155.13 kg)   Goal Weight 210 lb (95.255 kg)   Increase Aerobic Exercise and Physical Activity Yes   Intervention While in program, learn and follow the exercise prescription taught. Start at a low level workload and increase workload after able to maintain previous level for 30 minutes. Increase time before increasing intensity.   Understand more about Heart/Pulmonary Disease.  Yes   Intervention While in program utilize professionals for any questions, and attend the education sessions. Great websites to use are www.americanheart.org or www.lung.org for reliable information.   Improve shortness of breath with ADL's Yes   Intervention While in program, learn and follow the exercise prescription taught. Start at a low level workload and increase workload ad advised by the exercise physiologist. Increase time before increasing intensity.   Develop more efficient breathing techniques such as purse lipped breathing and diaphragmatic breathing; and practicing self-pacing with activity Yes   Intervention While in program, learn and utilize the specific breathing techniques taught to you. Continue to practice and use the techniques as needed.   Increase knowledge of respiratory medications and ability to use respiratory devices properly.  Yes   Diabetes Yes   Goal Blood glucose control identified by blood glucose values, HgbA1C. Participant verbalizes understanding of the signs/symptoms of hyper/hypo glycemia, proper foot care and importance of medication and nutrition plan for blood glucose control.   Intervention Provide nutrition & aerobic exercise along with prescribed medications to achieve blood glucose in normal ranges: Fasting 65-99 mg/dL   Hypertension Yes   Goal Participant will see blood pressure controlled within the values of 140/30m/Hg or within value directed by their physician.   Intervention Provide nutrition & aerobic exercise along with prescribed medications to achieve BP 140/90 or less.   Lipids Yes   Goal Cholesterol controlled with medications as prescribed, with individualized exercise RX and with personalized nutrition plan. Value goals: LDL < 788m HDL > 4037mParticipant states understanding of desired cholesterol values and following prescriptions.   Intervention Provide nutrition & aerobic exercise along with prescribed medications to achieve LDL  <7m30mDL >40mg59mStress Yes   Goal To meet with psychosocial counselor for stress and relaxation information and guidance. To state understanding of performing relaxation techniques and or identifying personal stressors.   Intervention Provide education on types of stress, identifiying stressors, and ways to cope with stress. Provide demonstration and active practice of relaxation techniques.   Personal Goal Other Yes   Personal Goal "I want to sing without shortness of breath."   Intervention Other  Reduce shortness of breath with exercise and pursed- lip breathing   Intervention While in program learn and demonstrate appropriate use of your oxygen therapy by increasing flow with exertion, manage oxygen tank operation, including continuous and intermittent flow.  Understanding oxygen is a drug ordered by your physician.      Personal Goals and Risk Factors Review:      Goals and Risk Factor Review      11/15/14 1130 11/29/14 1130 12/06/14 1130       Weight Management   Goals Progress/Improvement seen Yes       Comments Mr OlsonAlredfilled out the dietitian's questionnaire; it was faxed to her; and he will meet with her after the holiday.  Increase Aerobic Exercise and Physical Activity   Goals Progress/Improvement seen    Yes     Comments   Mr Yan has had increases with his exercise goals on the TM, T5, and TM.     Improve shortness of breath with ADL's   Goals Progress/Improvement seen    Yes     Comments   In working with Mr Bollman on his oxygen and O2Sat's, he has been able to increase his exercise goals which help improve his shortness of breath, especially with the correct oxygen flow rates.     Breathing Techniques   Goals Progress/Improvement seen    Yes     Comments   Mr Delisle works very hard to incorporate PLB with his exercise.     Increase knowledge of respiratory medications   Goals Progress/Improvement seen   Yes      Comments  Worked with Mr Wojtkiewicz and portable  oxygen; I had him use the Eclipse which is a Systems developer and used in our gym; He was not able to maintain O2Sat's on the 6l/m intermittent and we switched him to the 6l/m gas portable with continuous flow.  I also gave him a copy of a Pulmonary PPaper which had a great article on portable concentrators.         Personal Goals Discharge:    Comments: 30 day review

## 2014-12-11 NOTE — Progress Notes (Signed)
Daily Session Note  Patient Details  Name: Miguel Palmer MRN: [id number] Date of Birth: 08/11/1940 Referring Provider:  Klein, David, MD  Encounter Date: 12/11/2014  Check In:     Session Check In - 12/11/14 1224    Check-In   Staff Present Laureen Brown BS, RRT, Respiratory Therapist;  RN, BSN;Steven Way BS, ACSM EP-C, Exercise Physiologist   ER physicians immediately available to respond to emergencies [facility] immediately available ER MD   Physician(s) Dr. Stafford and Dr. Kinner   Medication changes reported     No   Fall or balance concerns reported    No   Warm-up and Cool-down Performed on first and last piece of equipment   VAD Patient? No   Pain Assessment   Currently in Pain? No/denies         Goals Met:  Proper associated with RPD/PD & O2 Sat Personal goals reviewed  Goals Unmet:  Not Applicable  Goals Comments:    Dr. Mark Miller is Medical Director for [facility] and [facility]. 

## 2014-12-12 ENCOUNTER — Ambulatory Visit: Payer: Medicare Other | Admitting: Dietician

## 2014-12-13 DIAGNOSIS — J449 Chronic obstructive pulmonary disease, unspecified: Secondary | ICD-10-CM

## 2014-12-13 NOTE — Progress Notes (Signed)
Daily Session Note  Patient Details  Name: Miguel Palmer MRN: [id number] Date of Birth: [birth year] Referring Provider:  [name], MD  Encounter Date: 12/13/2014  Check In:     Session Check In - 12/13/14 1203    Check-In   Staff Present Lestine Box BS, ACSM EP-C, Exercise Physiologist;Renee Dillard Essex MS, ACSM CEP Exercise Physiologist;Laureen Janell Quiet, RRT, Respiratory Therapist   ER physicians immediately available to respond to emergencies [facility] immediately available ER MD   Physician(s) Thomasene Lot and lord   Medication changes reported     No   Fall or balance concerns reported    No   Warm-up and Cool-down Performed on first and last piece of equipment   VAD Patient? No   Pain Assessment   Currently in Pain? No/denies         Goals Met:  Proper associated with RPD/PD & O2 Sat Exercise tolerated well No report of cardiac concerns or symptoms Strength training completed today  Goals Unmet:  Not Applicable  Goals Comments:   Dr. Emily Filbert is Medical Director for [place] and [facility].

## 2014-12-20 ENCOUNTER — Encounter: Payer: Medicare Other | Admitting: *Deleted

## 2014-12-20 DIAGNOSIS — J449 Chronic obstructive pulmonary disease, unspecified: Secondary | ICD-10-CM | POA: Diagnosis not present

## 2014-12-20 NOTE — Progress Notes (Signed)
Daily Session Note  Patient Details  Name: Gyasi Hazzard MRN: [id number] Date of Birth: 22-Feb-1941 Referring Provider:  [name], MD  Encounter Date: 12/20/2014  Check In:     Session Check In - 12/20/14 1149    Check-In   Staff Present Candiss Norse MS, ACSM CEP Exercise Physiologist;Steven Way BS, ACSM EP-C, Exercise Physiologist;Laureen Janell Quiet, RRT, Respiratory Therapist   ER physicians immediately available to respond to emergencies [facility] immediately available ER MD   Physician(s) Archie Balboa and Joni Fears   Medication changes reported     No   Fall or balance concerns reported    No   Warm-up and Cool-down Performed on first and last piece of equipment   VAD Patient? No   Pain Assessment   Currently in Pain? No/denies   Multiple Pain Sites No         Goals Met:  Proper associated with RPD/PD & O2 Sat Independence with exercise equipment Using PLB without cueing & demonstrates good technique Exercise tolerated well Strength training completed today  Goals Unmet:  Not Applicable  Goals Comments:   Dr. Emily Filbert is Medical Director for [place] and [facility].

## 2014-12-21 ENCOUNTER — Encounter: Payer: Medicare Other | Admitting: Dietician

## 2014-12-22 ENCOUNTER — Encounter: Payer: Medicare Other | Attending: Family Medicine

## 2014-12-22 DIAGNOSIS — J449 Chronic obstructive pulmonary disease, unspecified: Secondary | ICD-10-CM | POA: Insufficient documentation

## 2014-12-27 ENCOUNTER — Encounter: Payer: Medicare Other | Admitting: *Deleted

## 2014-12-27 DIAGNOSIS — J449 Chronic obstructive pulmonary disease, unspecified: Secondary | ICD-10-CM

## 2014-12-27 DIAGNOSIS — G4733 Obstructive sleep apnea (adult) (pediatric): Secondary | ICD-10-CM

## 2014-12-27 NOTE — Progress Notes (Signed)
Daily Session Note  Patient Details  Name: Miguel Palmer MRN: [id number] Date of Birth: 03/02/1941 Referring Provider:  [name], MD  Encounter Date: 12/27/2014  Check In:     Session Check In - 12/27/14 1157    Check-In   Staff Present Candiss Norse MS, ACSM CEP Exercise Physiologist;Carroll Enterkin RN, BSN;Steven Way BS, ACSM EP-C, Exercise Physiologist;Laureen Janell Quiet, RRT, Respiratory Therapist   ER physicians immediately available to respond to emergencies [facility] immediately available ER MD   Physician(s) Joni Fears and Thomasene Lot   Medication changes reported     No   Fall or balance concerns reported    No   Warm-up and Cool-down Performed on first and last piece of equipment   VAD Patient? No   Pain Assessment   Currently in Pain? No/denies   Multiple Pain Sites No         Goals Met:  Proper associated with RPD/PD & O2 Sat Independence with exercise equipment Exercise tolerated well Strength training completed today  Goals Unmet:  Not Applicable  Goals Comments:    Dr. Emily Filbert is Medical Director for [place] and [facility].

## 2015-01-01 DIAGNOSIS — J449 Chronic obstructive pulmonary disease, unspecified: Secondary | ICD-10-CM | POA: Diagnosis not present

## 2015-01-01 DIAGNOSIS — G4733 Obstructive sleep apnea (adult) (pediatric): Secondary | ICD-10-CM

## 2015-01-01 NOTE — Progress Notes (Signed)
Daily Session Note  Patient Details  Name: Miguel Palmer MRN: [id number] Date of Birth: 1940/07/23 Referring Provider:  [name], MD  Encounter Date: 01/01/2015  Check In:     Session Check In - 01/01/15 1228    Check-In   Staff Present Lestine Box BS, ACSM EP-C, Exercise Physiologist;Laureen Janell Quiet, RRT, Respiratory Therapist   ER physicians immediately available to respond to emergencies [facility] immediately available ER MD   Physician(s) goodman and williams   Medication changes reported     No   Fall or balance concerns reported    No   Warm-up and Cool-down Performed on first and last piece of equipment   VAD Patient? No   Pain Assessment   Currently in Pain? No/denies           Exercise Prescription Changes - 01/01/15 1200    Exercise Review   Progression Yes   Treadmill   MPH 1.5   Grade 0   Minutes 12   NuStep   Level 5   Watts 50   Recumbant Elliptical   Level 3   RPM 60      Goals Met:  Proper associated with RPD/PD & O2 Sat Exercise tolerated well No report of cardiac concerns or symptoms Strength training completed today  Goals Unmet:  Not Applicable  Goals Comments:    Dr. Emily Filbert is Medical Director for [place] and [facility].

## 2015-01-03 ENCOUNTER — Encounter: Payer: Medicare Other | Admitting: *Deleted

## 2015-01-03 DIAGNOSIS — G4733 Obstructive sleep apnea (adult) (pediatric): Secondary | ICD-10-CM

## 2015-01-03 DIAGNOSIS — J449 Chronic obstructive pulmonary disease, unspecified: Secondary | ICD-10-CM | POA: Diagnosis not present

## 2015-01-03 NOTE — Progress Notes (Signed)
Daily Session Note  Patient Details  Name: Miguel Palmer MRN: [id number] Date of Birth: 1941/01/24 Referring Provider:  [name], MD  Encounter Date: 01/03/2015  Check In:     Session Check In - 01/03/15 1154    Check-In   Staff Present Carson Myrtle BS, RRT, Respiratory Therapist;Sheamus Hasting Dillard Essex MS, ACSM CEP Exercise Physiologist;Steven Way BS, ACSM EP-C, Exercise Physiologist   ER physicians immediately available to respond to emergencies [facility] immediately available ER MD   Physician(s) Clearnce Hasten and Quale   Medication changes reported     No   Fall or balance concerns reported    No   Warm-up and Cool-down Performed on first and last piece of equipment   VAD Patient? No   Pain Assessment   Currently in Pain? No/denies   Multiple Pain Sites No         Goals Met:  Proper associated with RPD/PD & O2 Sat Independence with exercise equipment Using PLB without cueing & demonstrates good technique Exercise tolerated well Strength training completed today  Goals Unmet:  Not Applicable  Goals Comments:   Dr. Emily Filbert is Medical Director for [place] and [facility].

## 2015-01-04 DIAGNOSIS — I10 Essential (primary) hypertension: Secondary | ICD-10-CM | POA: Insufficient documentation

## 2015-01-05 DIAGNOSIS — I214 Non-ST elevation (NSTEMI) myocardial infarction: Secondary | ICD-10-CM | POA: Insufficient documentation

## 2015-01-05 DIAGNOSIS — E782 Mixed hyperlipidemia: Secondary | ICD-10-CM | POA: Insufficient documentation

## 2015-01-08 ENCOUNTER — Encounter: Payer: Medicare Other | Admitting: Respiratory Therapy

## 2015-01-08 DIAGNOSIS — J449 Chronic obstructive pulmonary disease, unspecified: Secondary | ICD-10-CM | POA: Diagnosis not present

## 2015-01-08 DIAGNOSIS — G4733 Obstructive sleep apnea (adult) (pediatric): Secondary | ICD-10-CM

## 2015-01-08 NOTE — Progress Notes (Signed)
Pulmonary Individual Treatment Plan  Patient Details  Name: Miguel Palmer MRN: [id number] Date of Birth: August 01, 1940 Referring Provider:  [name], MD  Initial Encounter Date: 10/31/2014  Visit Diagnosis: COPD, mild  OSA (obstructive sleep apnea)  Patient's Home Medications on Admission:  Current outpatient prescriptions:    aspirin 81 MG tablet, Take 81 mg by mouth every other day., Disp: , Rfl:    co-enzyme Q-10 30 MG capsule, Take 30 mg by mouth 2 (two) times daily., Disp: , Rfl:    dextroamphetamine (DEXEDRINE SPANSULE) 15 MG 24 hr capsule, Take 15 mg by mouth daily., Disp: , Rfl:    gabapentin (NEURONTIN) 400 MG capsule, Take by mouth., Disp: , Rfl:    Ginkgo Biloba 60 MG TABS, Take by mouth., Disp: , Rfl:    Glucosamine Sulfate 1000 MG CAPS, Take by mouth., Disp: , Rfl:    isosorbide mononitrate (IMDUR) 30 MG 24 hr tablet, TAKE 1 TABLET BY MOUTH EVERY DAY, Disp: , Rfl:    metFORMIN (GLUCOPHAGE) 500 MG tablet, TAKE 1 TABLET BY MOUTH DAILY., Disp: , Rfl:    metoprolol succinate (TOPROL-XL) 25 MG 24 hr tablet, TAKE 1 TABLET BY MOUTH EVERY DAY, Disp: , Rfl:    modafinil (PROVIGIL) 200 MG tablet, Take by mouth., Disp: , Rfl:    Multiple Vitamins-Minerals (MULTIVITAMIN & MINERAL PO), Take by mouth., Disp: , Rfl:    niacin (NIASPAN) 1000 MG CR tablet, Take by mouth., Disp: , Rfl:    nitroGLYCERIN (NITROSTAT) 0.4 MG SL tablet, Place under the tongue., Disp: , Rfl:    prasugrel (EFFIENT) 10 MG TABS tablet, TAKE 1 TABLET BY MOUTH EVERY DAY, Disp: , Rfl:    rosuvastatin (CRESTOR) 20 MG tablet, Take by mouth., Disp: , Rfl:    tiotropium (SPIRIVA) 18 MCG inhalation capsule, Place into inhaler and inhale., Disp: , Rfl:   Past Medical History: No past medical history on file.  Tobacco Use: History  Smoking status   Former Smoker -- 2.00 packs/day for 22 years   Types: Cigarettes  Smokeless tobacco   Former Systems developer   Quit date: 09/03/1980    Labs: Recent  Review Flowsheet Data    There is no flowsheet data to display.         POCT Glucose      11/03/14 1524 11/06/14 1449 11/08/14 1130       POCT Blood Glucose   Pre-Exercise 100 mg/dL 100 mg/dL      Post-Exercise 85 mg/dL 85 mg/dL      Pre-Exercise #2  102 mg/dL      Post-Exercise #2  95 mg/dL  Peanut butter crackers given for after class on the way to lunch      Pre-Exercise #3   96 mg/dL     Post-Exercise #3   99 mg/dL        ADL UCSD:     ADL UCSD      10/31/14 1131 01/03/15 1130     ADL UCSD   ADL Phase Entry Mid    SOB Score total 75 69    Rest 1 1    Walk 1 3    Stairs 4 4    Bath 3 3    Dress 3 3    Shop 3 5        Pulmonary Function Assessment:     Pulmonary Function Assessment - 10/31/14 1129    Pulmonary Function Tests   FVC% 56 %   FEV1% 61 %  FEV1/FVC Ratio 83.86   RV% 64 %   DLCO% 45 %   Breath   Bilateral Breath Sounds Clear   Shortness of Breath No;Yes      Exercise Target Goals:    Exercise Program Goal: Individual exercise prescription set with THRR, safety & activity barriers. Participant demonstrates ability to understand and report RPE using BORG scale, to self-measure pulse accurately, and to acknowledge the importance of the exercise prescription.  Exercise Prescription Goal: Starting with aerobic activity 30 plus minutes a day, 3 days per week for initial exercise prescription. Provide home exercise prescription and guidelines that participant acknowledges understanding prior to discharge.  Activity Barriers & Risk Stratification:     Activity Barriers & Risk Stratification - 10/31/14 1128    Activity Barriers & Risk Stratification   Activity Barriers None      6 Minute Walk:     6 Minute Walk      10/31/14 1702 01/03/15 1225     6 Minute Walk   Phase Initial Mid Program    Distance 940 feet 880 feet    Walk Time 6 minutes 6 minutes    Resting HR 73 bpm 105 bpm    Resting BP 128/76 mmHg 124/70 mmHg    Max Ex.  HR 131 bpm 136 bpm    Max Ex. BP 162/84 mmHg 182/82 mmHg    RPE 15 17    Perceived Dyspnea  4 6    Symptoms No No       Initial Exercise Prescription:     Initial Exercise Prescription - 10/31/14 1700    Date of Initial Exercise Prescription   Date 10/31/14   Treadmill   MPH 1.4   Grade 0   Minutes 10   Bike   Level 1   Watts 30   Minutes 10   Recumbant Bike   Level 1   RPM 30   Watts 20   Minutes 10   NuStep   Level 2   Watts 50   Minutes 10   Arm Ergometer   Level 1   Watts 10   Minutes 10   Arm/Foot Ergometer   Level 1   Watts 12   Minutes 10   Recumbant Elliptical   Level 1   RPM 40   Watts 20   Minutes 10   Elliptical   Level 1   Speed 3   Minutes 1   REL-XR   Level 2   Watts 50   Minutes 10   Prescription Details   Frequency (times per week) 3   Duration Progress to 30 minutes of continuous aerobic without signs/symptoms of physical distress   Intensity   THRR REST +  30   Ratings of Perceived Exertion 11-15   Perceived Dyspnea 2-4   Resistance Training   Training Prescription Yes   Weight 1   Reps 10-12      Exercise Prescription Changes:     Exercise Prescription Changes      11/03/14 1500 11/06/14 1400 11/10/14 1300 11/13/14 1000 11/13/14 1400   Exercise Review   Progression    Yes Yes   Response to Exercise   Blood Pressure (Admit)    130/80 mmHg 130/80 mmHg   Blood Pressure (Exercise)    160/90 mmHg 160/90 mmHg   Blood Pressure (Exit)    142/84 mmHg 142/84 mmHg   Heart Rate (Admit)     82 bpm   Heart Rate (Exercise)  118 bpm   Heart Rate (Exit)     84 bpm   Oxygen Saturation (Admit)     92 %   Oxygen Saturation (Exercise)     88 %   Oxygen Saturation (Exit)     96 %   Rating of Perceived Exertion (Exercise)     13   Perceived Dyspnea (Exercise)     13   Intensity    THRR unchanged THRR unchanged   Progression    Continue progressive overload as per policy without signs/symptoms or physical distress. Continue  progressive overload as per policy without signs/symptoms or physical distress.   Resistance Training   Training Prescription   Yes  2 lbs. Yes Yes   Weight   _0 Reps    10-12 10-12   Treadmill   MPH  1.4 1.5 1.4 1.4   Grade  0  0 0   Minutes  _1 NuStep   Level _2 Watts 50   30 30   Minutes _3 Recumbant Elliptical   Level  1  1 1.5   RPM  _4 Watts     12   Minutes  _5 12/06/14 1200 12/06/14 1500 12/08/14 1400 12/11/14 1200 01/01/15 1200   Exercise Review   Progression Yes Yes  Yes Yes   Response to Exercise   Blood Pressure (Admit) 130/80 mmHg 148/80 mmHg   118/78 mmHg   Blood Pressure (Exercise) 160/90 mmHg 142/74 mmHg   140/68 mmHg   Blood Pressure (Exit) 142/84 mmHg 140/80 mmHg   122/62 mmHg   Heart Rate (Admit) 82 bpm 95 bpm   80 bpm   Heart Rate (Exercise) 118 bpm 118 bpm   108 bpm   Heart Rate (Exit) 84 bpm 95 bpm   85 bpm   Oxygen Saturation (Admit) 92 % 94 %   91 %  6l/m continuous   Oxygen Saturation (Exercise) 88 % 90 %  6l/m continuous   86 %   Oxygen Saturation (Exit) 96 % 97 %   97 %   Rating of Perceived Exertion (Exercise) _6 Perceived Dyspnea (Exercise) _7 Intensity THRR unchanged       Progression Continue progressive overload as per policy without signs/symptoms or physical distress.       Resistance Training   Training Prescription Yes    Yes   Weight 2    4   Reps 10-12    10-12   Treadmill   MPH 1.4  1.5 1.5 1.5   Grade 0  0 0 0   Minutes _8 NuStep   Level _9 Watts 30   45 50   Minutes 15    15   Recumbant Elliptical   Level _10 RPM 20   60 40   Watts 15       Minutes 10    12      Discharge Exercise Prescription (Final Exercise Prescription Changes):     Exercise Prescription Changes - 01/01/15 1200    Exercise Review   Progression Yes   Response to Exercise   Blood Pressure (Admit) 118/78 mmHg   Blood Pressure (Exercise) 140/68  mmHg    Blood Pressure (Exit) 122/62 mmHg   Heart Rate (Admit) 80 bpm   Heart Rate (Exercise) 108 bpm   Heart Rate (Exit) 85 bpm   Oxygen Saturation (Admit) 91 %  6l/m continuous   Oxygen Saturation (Exercise) 86 %   Oxygen Saturation (Exit) 97 %   Rating of Perceived Exertion (Exercise) 13   Perceived Dyspnea (Exercise) 4   Resistance Training   Training Prescription Yes   Weight 4   Reps 10-12   Treadmill   MPH 1.5   Grade 0   Minutes 12   NuStep   Level 5   Watts 50   Minutes 15   Recumbant Elliptical   Level 5   RPM 40   Minutes 12       Nutrition:  Target Goals: Understanding of nutrition guidelines, daily intake of sodium <1545m, cholesterol <2031m calories 30% from fat and 7% or less from saturated fats, daily to have 5 or more servings of fruits and vegetables.  Biometrics:     Pre Biometrics - 10/31/14 1705    Pre Biometrics   Height 6' 2"  (1.88 m)   Weight (!) 342 lb 9.6 oz (155.402 kg)   Waist Circumference 55 inches   Hip Circumference 56 inches   Waist to Hip Ratio 0.98 %   BMI (Calculated) 44.1       Nutrition Therapy Plan and Nutrition Goals:     Nutrition Therapy & Goals - 12/21/14 1200    Nutrition Therapy   Diet Instructed patient on a 2000 calorie vegan meal plan, DASH diet principles.  He states that he is following Dr. EsMaurine MinisterReverse Heart Disease" diet which is vegan and eliminates all oils but admits he is having difficulty following it. Discussed the benefits of a vegan diet but stresssed that have to be careful to include adequate protein.   Fiber 20 grams   Whole Grain Foods 4 servings   Protein 8 ounces/day   Saturated Fats 13 max. grams   Fruits and Vegetables 5 servings/day   Personal Nutrition Goals   Personal Goal #1 To space meals 4-6 hours apart.   Personal Goal #2 Include a protein source at every meal. Refer to list given on meat alternatives. Goal of 75 gms protein/day.   Personal Goal #3 Use my Fitness Pal to record  food/beverage intake   Personal Goal #4 In addtions to strategies discussed to help with weight loss, refer to "National Weight Loss Registry" for numerous other healthy strategies that participants have used to help lose and maintain weight loss.        Nutrition Discharge: Rate Your Plate Scores:     Rate Your Plate - 0733/29/5128841  Rate Your Plate Scores   Pre Score 55   Pre Score % 79 %  Due to vegan diet, many of the questions were  N/A      Psychosocial: Target Goals: Acknowledge presence or absence of depression, maximize coping skills, provide positive support system. Participant is able to verbalize types and ability to use techniques and skills needed for reducing stress and depression.  Initial Review & Psychosocial Screening:     Initial Psych Review & Screening - 10/31/14 0900    Initial Review   Current issues with Current Depression;Current Sleep Concerns;Current Stress Concerns   Source of Stress Concerns Chronic Illness;Unable to participate in former interests or hobbies   [place]   Barriers  Psychosocial barriers to participate in program There are no identifiable barriers or psychosocial needs.;The patient should benefit from training in stress management and relaxation.   Screening Interventions   Interventions Encouraged to exercise;Program counselor consult      Quality of Life Scores:     Quality of Life - 10/31/14 0900    Quality of Life Scores   Health/Function Pre 11.72 %   Socioeconomic Pre 8.5 %   Psych/Spiritual Pre 16.06 %   Family Pre 10.07 %   GLOBAL Pre 17.4 %      PHQ-9:     Recent Review Flowsheet Data    Depression screen [facility] 2/9 10/31/2014   Decreased Interest 1   Down, Depressed, Hopeless 1   PHQ - 2 Score 2   Altered sleeping 0   Tired, decreased energy 3   Change in appetite 3   Feeling bad or failure about yourself  3   Trouble concentrating 1   Moving slowly or fidgety/restless  2   Suicidal thoughts 1   PHQ-9 Score 15   Difficult doing work/chores Somewhat difficult      Psychosocial Evaluation and Intervention:     Psychosocial Evaluation - 11/08/14 1627    Psychosocial Evaluation & Interventions   Interventions Stress management education;Encouraged to exercise with the program and follow exercise prescription;Relaxation education   Comments Counselor met with Mr. Germond today for initial psychosocial evaluation.  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  He lives with a spouse of 23 years and several friends whom he refers to as part of his support system.  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 Jenetta Downer reporting he doesn't use his CPAP to sleep as it is not user friendly.  Counselor reviewed the PHQ-9 with a score of "17" with Mr. Jenetta Downer confirming all his answers are current and accurate.  When assessed for depression, he denies a history and states any current symptoms are "situational" due to his health issues primarily.  Mr. Jenetta Downer also reports current stressors are health, financial, conflict with his daughter, a close cousin with pancreatic cancer and his inability to do the things that used to bring him pleasure, such as playing the guitar/banjo with friends and playing his harmonica.  Mr. Colman Cater goals for this program are to breathe better, lose some weight, increase his physical strength and be stronger and more able to walk/go shopping without being "tied to this big oxygen tank."  Counselor discussed Mr. Jenetta Downer mentioning to his Dr. for a medication evaluation for his depressive symptoms and possibly seeing a therpist for support currently.  He preferred to wait to see if this program helps him feel better/stronger first.  Counselor will re-assess over the next few weeks and make further recommendations at that time.      Continued Psychosocial  Services Needed Yes  Mr. O will benefit from all the educational components of this program, and participated in Relaxation education on this date.  He will benefit from meeting with a dietician, and attending the depression and stress management educational components.      Psychosocial Re-Evaluation:  Education: Education Goals: Education classes will be provided on a weekly basis, covering required topics. Participant will state understanding/return demonstration of topics presented.  Learning Barriers/Preferences:     Learning Barriers/Preferences - 10/31/14 1128    Learning Barriers/Preferences   Learning Barriers None  Learning Preferences Group Instruction;Individual Instruction;Pictoral;Written Material;Video;Verbal Instruction;Skilled Demonstration      Education Topics: Initial Evaluation Education: - Verbal, written and demonstration of respiratory meds, RPE/PD scales, oximetry and breathing techniques. Instruction on use of nebulizers and MDIs: cleaning and proper use, rinsing mouth with steroid doses and importance of monitoring MDI activations.          Pulmonary Rehab from 01/01/2015 in [place]   Date  10/31/14   Educator  LB   Instruction Review Code  2- meets goals/outcomes      General Nutrition Guidelines/Fats and Fiber: -Group instruction provided by verbal, written material, models and posters to present the general guidelines for heart healthy nutrition. Gives an explanation and review of dietary fats and fiber.   Controlling Sodium/Reading Food Labels: -Group verbal and written material supporting the discussion of sodium use in heart healthy nutrition. Review and explanation with models, verbal and written materials for utilization of the food label.   Exercise Physiology & Risk Factors: - Group verbal and written instruction with models to review the exercise physiology of the cardiovascular system and  associated critical values. Details cardiovascular disease risk factors and the goals associated with each risk factor.   Aerobic Exercise & Resistance Training: - Gives group verbal and written discussion on the health impact of inactivity. On the components of aerobic and resistive training programs and the benefits of this training and how to safely progress through these programs.      Pulmonary Rehab from 01/01/2015 in [place]   Date  12/27/14   Educator  SW   Instruction Review Code  2- meets goals/outcomes      Flexibility, Balance, General Exercise Guidelines: - Provides group verbal and written instruction on the benefits of flexibility and balance training programs. Provides general exercise guidelines with specific guidelines to those with heart or lung disease. Demonstration and skill practice provided.   Stress Management: - Provides group verbal and written instruction about the health risks of elevated stress, cause of high stress, and healthy ways to reduce stress.   Depression: - Provides group verbal and written instruction on the correlation between heart/lung disease and depressed mood, treatment options, and the stigmas associated with seeking treatment.      Pulmonary Rehab from 01/01/2015 in [place]   Date  12/06/14   Educator  Berle Mull, MSW   Instruction Review Code  2- meets goals/outcomes      Exercise & Equipment Safety: - Individual verbal instruction and demonstration of equipment use and safety with use of the equipment.      Pulmonary Rehab from 01/01/2015 in [place]   Date  11/03/14   Educator  S.Joyce [Stacey [place]   Instruction Review Code  2- meets goals/outcomes      Infection Prevention: - Provides verbal and written material to individual with discussion of infection control including proper hand washing and proper  equipment cleaning during exercise session.      Pulmonary Rehab from 01/01/2015 in [place]   Date  11/03/14   Educator  Jacqulyn Ducking   Instruction Review Code  2- meets goals/outcomes      Falls Prevention: - Provides verbal and written material to individual with discussion of falls prevention and safety.   Diabetes: - Individual verbal and written instruction to review signs/symptoms of diabetes, desired ranges of glucose level fasting, after meals  and with exercise. Advice that pre and post exercise glucose checks will be done for 3 sessions at entry of program.      Pulmonary Rehab from 01/01/2015 in [place]   Date  12/01/14   Educator  C. Enterkin   Instruction Review Code  2- meets goals/outcomes      Chronic Lung Diseases: - Group verbal and written instruction to review new updates, new respiratory medications, new advancements in procedures and treatments. Provide informative websites and "800" numbers of self-education.   Lung Procedures: - Group verbal and written instruction to describe testing methods done to diagnose lung disease. Review the outcome of test results. Describe the treatment choices: Pulmonary Function Tests, ABGs and oximetry.      Pulmonary Rehab from 01/01/2015 in [place]   Date  10/31/14   Educator  LB   Instruction Review Code  2- meets goals/outcomes      Energy Conservation: - Provide group verbal and written instruction for methods to conserve energy, plan and organize activities. Instruct on pacing techniques, use of adaptive equipment and posture/positioning to relieve shortness of breath.      Pulmonary Rehab from 01/01/2015 in [place]   Date  11/15/14   Educator  SW   Instruction Review Code  2- meets goals/outcomes      Triggers: - Group verbal and written instruction to review  types of environmental controls: home humidity, furnaces, filters, dust mite/pet prevention, HEPA vacuums. To discuss weather changes, air quality and the benefits of nasal washing.      Pulmonary Rehab from 01/01/2015 in [place]   Date  01/01/15   Educator  LB   Instruction Review Code  2- meets goals/outcomes      Exacerbations: - Group verbal and written instruction to provide: warning signs, infection symptoms, calling MD promptly, preventive modes, and value of vaccinations. Review: effective airway clearance, coughing and/or vibration techniques. Create an Sports administrator.   Oxygen: - Individual and group verbal and written instruction on oxygen therapy. Includes supplement oxygen, available portable oxygen systems, continuous and intermittent flow rates, oxygen safety, concentrators, and Medicare reimbursement for oxygen.      Pulmonary Rehab from 01/01/2015 in [place]   Date  10/31/14   Educator  LB   Instruction Review Code  2- meets goals/outcomes      Respiratory Medications: - Group verbal and written instruction to review medications for lung disease. Drug class, frequency, complications, importance of spacers, rinsing mouth after steroid MDI's, and proper cleaning methods for nebulizers.      Pulmonary Rehab from 01/01/2015 in [place]   Date  10/31/14   Educator  LB   Instruction Review Code  2- meets goals/outcomes      AED/CPR: - Group verbal and written instruction with the use of models to demonstrate the basic use of the AED with the basic ABC's of resuscitation.      Pulmonary Rehab from 01/01/2015 in [place]   Date  11/17/14   Educator  CE   Instruction Review Code  2- meets goals/outcomes      Breathing Retraining: - Provides individuals verbal and written instruction on purpose, frequency, and proper  technique of diaphragmatic breathing and pursed-lipped breathing. Applies individual practice skills.      Pulmonary Rehab from 01/01/2015 in [place]  [facility]   Date  10/31/14   Educator  LB   Instruction Review Code  R- Review/reinforce      Anatomy and Physiology of the Lungs: - Group verbal and written instruction with the use of models to provide basic lung anatomy and physiology related to function, structure and complications of lung disease.   Heart Failure: - Group verbal and written instruction on the basics of heart failure: signs/symptoms, treatments, explanation of ejection fraction, enlarged heart and cardiomyopathy.   Sleep Apnea: - Individual verbal and written instruction to review Obstructive Sleep Apnea. Review of risk factors, methods for diagnosing and types of masks and machines for OSA.   Anxiety: - Provides group, verbal and written instruction on the correlation between heart/lung disease and anxiety, treatment options, and management of anxiety.   Relaxation: - Provides group, verbal and written instruction about the benefits of relaxation for patients with heart/lung disease. Also provides patients with examples of relaxation techniques.      Pulmonary Rehab from 01/01/2015 in [place]   Date  11/08/14   Educator  Lucianne Lei LCSW   Instruction Review Code  2- Meets goals/outcomes      Knowledge Questionnaire Score:     Knowledge Questionnaire Score - 10/31/14 1129    Knowledge Questionnaire Score   Pre Score -5      Personal Goals and Risk Factors at Admission:     Personal Goals and Risk Factors at Admission - 10/31/14 1144    Personal Goals and Risk Factors on Admission    Weight Management Yes   Intervention Learn and follow the exercise and diet guidelines while in the program. Utilize the nutrition and education classes to help gain knowledge of the diet and exercise  expectations in the program   Admit Weight 342 lb (155.13 kg)   Goal Weight 210 lb (95.255 kg)   Increase Aerobic Exercise and Physical Activity Yes   Intervention While in program, learn and follow the exercise prescription taught. Start at a low level workload and increase workload after able to maintain previous level for 30 minutes. Increase time before increasing intensity.   Understand more about Heart/Pulmonary Disease. Yes   Intervention While in program utilize professionals for any questions, and attend the education sessions. Great websites to use are www.americanheart.org or www.lung.org for reliable information.   Improve shortness of breath with ADL's Yes   Intervention While in program, learn and follow the exercise prescription taught. Start at a low level workload and increase workload ad advised by the exercise physiologist. Increase time before increasing intensity.   Develop more efficient breathing techniques such as purse lipped breathing and diaphragmatic breathing; and practicing self-pacing with activity Yes   Intervention While in program, learn and utilize the specific breathing techniques taught to you. Continue to practice and use the techniques as needed.   Increase knowledge of respiratory medications and ability to use respiratory devices properly.  Yes   Diabetes Yes   Goal Blood glucose control identified by blood glucose values, HgbA1C. Participant verbalizes understanding of the signs/symptoms of hyper/hypo glycemia, proper foot care and importance of medication and nutrition plan for blood glucose control.   Intervention Provide nutrition & aerobic exercise along with prescribed medications to achieve blood glucose in normal ranges: Fasting 65-99 mg/dL   Hypertension Yes   Goal Participant will see blood pressure controlled within the values of 140/59m/Hg or within value directed by their physician.   Intervention Provide  nutrition & aerobic exercise along with  prescribed medications to achieve BP 140/90 or less.   Lipids Yes   Goal Cholesterol controlled with medications as prescribed, with individualized exercise RX and with personalized nutrition plan. Value goals: LDL < 15m, HDL > 478m Participant states understanding of desired cholesterol values and following prescriptions.   Intervention Provide nutrition & aerobic exercise along with prescribed medications to achieve LDL <7015mHDL >39m104m Stress Yes   Goal To meet with psychosocial counselor for stress and relaxation information and guidance. To state understanding of performing relaxation techniques and or identifying personal stressors.   Intervention Provide education on types of stress, identifiying stressors, and ways to cope with stress. Provide demonstration and active practice of relaxation techniques.   Personal Goal Other Yes   Personal Goal "I want to sing without shortness of breath."   Intervention Other  Reduce shortness of breath with exercise and pursed- lip breathing   Intervention While in program learn and demonstrate appropriate use of your oxygen therapy by increasing flow with exertion, manage oxygen tank operation, including continuous and intermittent flow.  Understanding oxygen is a drug ordered by your physician.      Personal Goals and Risk Factors Review:      Goals and Risk Factor Review      11/15/14 1130 11/29/14 1130 12/06/14 1130 12/20/14 1248 12/20/14 1548   Weight Management   Goals Progress/Improvement seen Yes       Comments Mr OlsoLaymon filled out the dietitian's questionnaire; it was faxed to her; and he will meet with her after the holiday.       Increase Aerobic Exercise and Physical Activity   Goals Progress/Improvement seen    Yes     Comments   Mr OlsoMarston had increases with his exercise goals on the TM, T5, and TM.     Improve shortness of breath with ADL's   Goals Progress/Improvement seen    Yes     Comments   In working with Mr OlsoHeber his oxygen and O2Sat's, he has been able to increase his exercise goals which help improve his shortness of breath, especially with the correct oxygen flow rates.     Breathing Techniques   Goals Progress/Improvement seen    Yes  Yes   Comments   Mr OlsoStaffordks very hard to incorporate PLB with his exercise.  Encouraging Mr OlsoRomanoffuse PLB with his exercise in [place]. With his low O2Sat's on the TM today, he improved his readings with the PLB. He states he really has to make himself breath deep. Encouraged him to use PLB  when walking into [place] and at his apartment in the long hallway to elevator.   Increase knowledge of respiratory medications   Goals Progress/Improvement seen   Yes      Comments  Worked with Mr OlsoKeep portable oxygen; I had him use the Eclipse which is a demoSystems developer used in our gym; He was not able to maintain O2Sat's on the 6l/m intermittent and we switched him to the 6l/m gas portable with continuous flow.  I also gave him a copy of a Pulmonary PPaper which had a great article on portable concentrators.      Other Goal   Goals Progress/Improvement seen     Yes    Comments    Discussed with Breck about a piece of exercise equipment he has avaiable for home use and the benefits of it.  01/01/15 1234 01/08/15 1547         Weight Management   Goals Progress/Improvement seen Yes       Comments ongoing, would like to lose 150 lbs.       Increase Aerobic Exercise and Physical Activity   Goals Progress/Improvement seen  Yes       Comments continue to make gains on the treadmill       Improve shortness of breath with ADL's   Goals Progress/Improvement seen  Yes Yes      Comments  Mr Bolio is managing his shortness of breath in [facility] by managing  his oxygen and adjusting the flow rate to 6l/m with exercise and with some activites at home activities      Breathing Techniques   Goals Progress/Improvement seen  Yes       Comments wants to improve, also incorporate meditation        Increase knowledge of respiratory medications   Goals Progress/Improvement seen   Yes      Comments  Mr Goins has a good understanding of his Spiriva; he states that he is not sure "it works", but explained the importance of Spiriva, especially with activity.      Stress   Goal To meet with psychosocial counselor for stress and relaxation information and guidance. To state understanding of performing relaxation techniques and or identifying personal stressors.          Personal Goals Discharge:    Comments: 30 day review

## 2015-01-08 NOTE — Progress Notes (Signed)
Daily Session Note  Patient Details  Name: Miguel Palmer MRN: [id number] Date of Birth: December 09, 1940 Referring Provider:  [name], MD  Encounter Date: 01/08/2015  Check In:      Goals Met:  Proper associated with RPD/PD & O2 Sat Independence with exercise equipment Using PLB without cueing & demonstrates good technique Exercise tolerated well Strength training completed today  Goals Unmet:  Not Applicable  Goals Comments:    Dr. Emily Filbert is Medical Director for [place] and [facility].

## 2015-01-09 ENCOUNTER — Encounter: Payer: Self-pay | Admitting: Internal Medicine

## 2015-01-12 ENCOUNTER — Encounter: Payer: Medicare Other | Admitting: *Deleted

## 2015-01-12 DIAGNOSIS — J449 Chronic obstructive pulmonary disease, unspecified: Secondary | ICD-10-CM

## 2015-01-12 DIAGNOSIS — G4733 Obstructive sleep apnea (adult) (pediatric): Secondary | ICD-10-CM

## 2015-01-12 NOTE — Progress Notes (Signed)
Daily Session Note  Patient Details  Name: Miguel Palmer MRN: [id number] Date of Birth: Jan 02, 1941 Referring Provider:  [name], MD  Encounter Date: 01/12/2015  Check In:     Session Check In - 01/12/15 1145    Check-In   Staff Present Gerlene Burdock RN, BSN;Stacey Blanch Media RRT, RCP Respiratory Therapist;Renee Dillard Essex MS, ACSM CEP Exercise Physiologist   ER physicians immediately available to respond to emergencies [facility] immediately available ER MD   Physician(s) Dr. Jacqualine Code, Dr. [place] Bigness   Medication changes reported     No   Fall or balance concerns reported    No   Warm-up and Cool-down Performed on first and last piece of equipment   VAD Patient? No   Pain Assessment   Currently in Pain? No/denies         Goals Met:  Proper associated with RPD/PD & O2 Sat Exercise tolerated well  Goals Unmet:  Not Applicable  Goals Comments: Doing well in Pulmonary Rehab on the Treadmill etc. [name] also reports that his Cardiologist has discontinued his Asprin therapy.    Dr. Emily Filbert is Medical Director for [place] and [facility].

## 2015-01-15 ENCOUNTER — Encounter: Payer: Medicare Other | Admitting: *Deleted

## 2015-01-15 DIAGNOSIS — J449 Chronic obstructive pulmonary disease, unspecified: Secondary | ICD-10-CM | POA: Diagnosis not present

## 2015-01-15 DIAGNOSIS — G4733 Obstructive sleep apnea (adult) (pediatric): Secondary | ICD-10-CM

## 2015-01-15 NOTE — Progress Notes (Signed)
Daily Session Note  Patient Details  Name: Miguel Palmer MRN: [id number] Date of Birth: 1940/10/22 Referring Josselin Gaulin:  [name], MD  Encounter Date: 01/15/2015  Check In:     Session Check In - 01/15/15 1244    Check-In   Staff Present Carson Myrtle BS, RRT, Respiratory Therapist;Carroll Enterkin RN, BSN;Susanne Bice RN, BSN, [place]   ER physicians immediately available to respond to emergencies [facility] immediately available ER MD   Physician(s) Dr. Archie Balboa and Dr. Corky Downs   Medication changes reported     No   Fall or balance concerns reported    No   Warm-up and Cool-down Performed on first and last piece of equipment   VAD Patient? No   Pain Assessment   Currently in Pain? No/denies           Exercise Prescription Changes - 01/15/15 1200    Exercise Review   Progression Yes   Response to Exercise   Blood Pressure (Admit) 118/78 mmHg   Blood Pressure (Exercise) 140/68 mmHg   Blood Pressure (Exit) 122/62 mmHg   Heart Rate (Admit) 80 bpm   Heart Rate (Exercise) 108 bpm   Heart Rate (Exit) 85 bpm   Oxygen Saturation (Admit) 91 %  6l/m continuous   Oxygen Saturation (Exercise) 86 %   Oxygen Saturation (Exit) 97 %   Rating of Perceived Exertion (Exercise) 13   Perceived Dyspnea (Exercise) 4   Resistance Training   Training Prescription Yes   Weight 4   Reps 10-12   Treadmill   MPH 1.5   Grade 0   Minutes 12   NuStep   Level 5   Watts 50   Minutes 15   Recumbant Elliptical   Level 6   RPM 50      Goals Met:  Proper associated with RPD/PD & O2 Sat Exercise tolerated well  Goals Unmet:  Not Applicable  Goals Comments: Increased Recumbent Elliptical to Level 6. Cont to watch pulse ox on 6 liters of supplemental oxygen.    Dr. Emily Filbert is Medical Director for [place] and [facility].

## 2015-01-17 DIAGNOSIS — J449 Chronic obstructive pulmonary disease, unspecified: Secondary | ICD-10-CM | POA: Diagnosis not present

## 2015-01-17 DIAGNOSIS — G4733 Obstructive sleep apnea (adult) (pediatric): Secondary | ICD-10-CM

## 2015-01-17 NOTE — Progress Notes (Signed)
Daily Session Note  Patient Details  Name: Miguel Palmer MRN: [id number] Date of Birth: [birth year] Referring Provider:  [name], MD  Encounter Date: 01/17/2015  Check In:     Session Check In - 01/17/15 1226    Check-In   Staff Present Lestine Box BS, ACSM EP-C, Exercise Physiologist;Stacey Blanch Media RRT, RCP Respiratory Therapist   ER physicians immediately available to respond to emergencies [facility] immediately available ER MD   Physician(s) paduchowski and lord   Medication changes reported     No   Fall or balance concerns reported    No   Warm-up and Cool-down Performed on first and last piece of equipment   VAD Patient? No   Pain Assessment   Currently in Pain? No/denies         Goals Met:  Proper associated with RPD/PD & O2 Sat Exercise tolerated well Personal goals reviewed No report of cardiac concerns or symptoms Strength training completed today  Goals Unmet:  Not Applicable   Goals Comments:    Dr. Emily Filbert is Medical Director for [place] and [facility].

## 2015-01-19 DIAGNOSIS — J449 Chronic obstructive pulmonary disease, unspecified: Secondary | ICD-10-CM | POA: Diagnosis not present

## 2015-01-19 DIAGNOSIS — G4733 Obstructive sleep apnea (adult) (pediatric): Secondary | ICD-10-CM

## 2015-01-19 NOTE — Progress Notes (Signed)
Daily Session Note  Patient Details  Name: Miguel Palmer MRN: [id number] Date of Birth: 1940-10-07 Referring Provider:  [name], MD  Encounter Date: 01/19/2015  Check In:     Session Check In - 01/19/15 1215    Check-In   Staff Present Gerlene Burdock RN, BSN;Laureen Janell Quiet, RRT, Respiratory Therapist;Other   ER physicians immediately available to respond to emergencies [facility] immediately available ER MD   Physician(s) Thomasene Lot and Jimmye Norman   Medication changes reported     No   Fall or balance concerns reported    No   Warm-up and Cool-down Performed on first and last piece of equipment   VAD Patient? No   Pain Assessment   Currently in Pain? No/denies         Goals Met:  Proper associated with RPD/PD & O2 Sat Independence with exercise equipment Using PLB without cueing & demonstrates good technique Exercise tolerated well Strength training completed today  Goals Unmet:  Not Applicable  Goals Comments: Mr Quesinberry is performing his treadmill goals first - it is the hardest.   Dr. Emily Filbert is Medical Director for [place] and [facility].

## 2015-01-22 ENCOUNTER — Encounter: Payer: Medicare Other | Attending: Family Medicine | Admitting: *Deleted

## 2015-01-22 DIAGNOSIS — J449 Chronic obstructive pulmonary disease, unspecified: Secondary | ICD-10-CM | POA: Insufficient documentation

## 2015-01-22 DIAGNOSIS — G4733 Obstructive sleep apnea (adult) (pediatric): Secondary | ICD-10-CM

## 2015-01-22 NOTE — Progress Notes (Signed)
Daily Session Note  Patient Details  Name: Miguel Palmer MRN: [id number] Date of Birth: 03-14-1941 Referring Provider:  [name], MD  Encounter Date: 01/22/2015  Check In:     Session Check In - 01/22/15 1314    Check-In   Staff Present Heath Lark RN, BSN, CCRP;Laureen [employer], RRT, Respiratory Therapist;Carroll Enterkin RN, BSN   ER physicians immediately available to respond to emergencies [facility] immediately available ER MD   Physician(s) Drs: Edd Fabian and Paduchowski   Medication changes reported     No   Fall or balance concerns reported    No   Warm-up and Cool-down Performed on first and last piece of equipment   VAD Patient? No   Pain Assessment   Currently in Pain? No/denies           Exercise Prescription Changes - 01/22/15 1300    Exercise Review   Progression Yes   Response to Exercise   Blood Pressure (Admit) 118/78 mmHg   Blood Pressure (Exercise) 140/68 mmHg   Blood Pressure (Exit) 122/62 mmHg   Heart Rate (Admit) 80 bpm   Heart Rate (Exercise) 108 bpm   Heart Rate (Exit) 85 bpm   Oxygen Saturation (Admit) 91 %  6l/m continuous   Oxygen Saturation (Exercise) 86 %   Oxygen Saturation (Exit) 97 %   Rating of Perceived Exertion (Exercise) 13   Perceived Dyspnea (Exercise) 4   Resistance Training   Training Prescription Yes   Weight 4   Reps 10-12   Treadmill   MPH 1.5   Grade 0   Minutes 12   NuStep   Level 6   Watts 50   Minutes 15   Recumbant Elliptical   Level 6   RPM 50      Goals Met:  Independence with exercise equipment Exercise tolerated well Personal goals reviewed Strength training completed today  Goals Unmet:  Not Applicable  Goals Comments: Doing well with exercise prescription progression.    Dr. Emily Filbert is Medical Director for [place] and [facility].

## 2015-01-24 ENCOUNTER — Encounter: Payer: Medicare Other | Admitting: *Deleted

## 2015-01-24 DIAGNOSIS — G4733 Obstructive sleep apnea (adult) (pediatric): Secondary | ICD-10-CM

## 2015-01-24 DIAGNOSIS — J449 Chronic obstructive pulmonary disease, unspecified: Secondary | ICD-10-CM | POA: Diagnosis not present

## 2015-01-24 NOTE — Progress Notes (Signed)
Daily Session Note  Patient Details  Name: Miguel Palmer MRN: [id number] Date of Birth: December 02, 1940 Referring Provider:  [name], MD  Encounter Date: 01/24/2015  Check In:     Session Check In - 01/24/15 1205    Check-In   Staff Present Carson Myrtle BS, RRT, Respiratory Therapist;Brylin Stopper Dillard Essex MS, ACSM CEP Exercise Physiologist;Steven Way BS, ACSM EP-C, Exercise Physiologist   ER physicians immediately available to respond to emergencies [facility] immediately available ER MD   Physician(s) Cinda Quest and Joni Fears   Medication changes reported     No   Fall or balance concerns reported    No   Warm-up and Cool-down Performed on first and last piece of equipment   VAD Patient? No   Pain Assessment   Currently in Pain? No/denies   Multiple Pain Sites No         Goals Met:  Proper associated with RPD/PD & O2 Sat Independence with exercise equipment Using PLB without cueing & demonstrates good technique Exercise tolerated well Strength training completed today  Goals Unmet:  Not Applicable  Goals Comments: Making progress with exercise goals and maintaining acceptable O2Sats with exercise on the 6l/m.   Dr. Emily Filbert is Medical Director for [place] and [facility].

## 2015-01-26 ENCOUNTER — Encounter: Payer: Medicare Other | Admitting: Respiratory Therapy

## 2015-01-26 DIAGNOSIS — J449 Chronic obstructive pulmonary disease, unspecified: Secondary | ICD-10-CM | POA: Diagnosis not present

## 2015-01-26 DIAGNOSIS — G4733 Obstructive sleep apnea (adult) (pediatric): Secondary | ICD-10-CM

## 2015-01-26 NOTE — Progress Notes (Signed)
Daily Session Note  Patient Details  Name: Miguel Palmer MRN: [id number] Date of Birth: 08/05/1940 Referring Provider:  [name], MD  Encounter Date: 01/26/2015  Check In:     Session Check In - 01/26/15 1702    Check-In   Staff Present Frederich Cha RRT, RCP Respiratory Therapist;Carroll Enterkin RN, Drusilla Kanner MS, ACSM CEP Exercise Physiologist   ER physicians immediately available to respond to emergencies [facility] immediately available ER MD   Physician(s) sR. wILLIAMS AND qUIGLEY   Medication changes reported     No   Fall or balance concerns reported    No   VAD Patient? No   Pain Assessment   Currently in Pain? No/denies         Goals Met:  Proper associated with RPD/PD & O2 Sat Using PLB without cueing & demonstrates good technique Exercise tolerated well Strength training completed today  Goals Unmet:  Not Applicable  Goals Comments:    Dr. Emily Filbert is Medical Director for [place] and [facility].

## 2015-01-29 ENCOUNTER — Encounter: Payer: Medicare Other | Admitting: *Deleted

## 2015-01-29 DIAGNOSIS — J449 Chronic obstructive pulmonary disease, unspecified: Secondary | ICD-10-CM | POA: Diagnosis not present

## 2015-01-29 NOTE — Progress Notes (Signed)
Daily Session Note  Patient Details  Name: Reiss Mowrey MRN: [id number] Date of Birth: September 21, 1940 Referring Provider:  [name], MD  Encounter Date: 01/29/2015  Check In:     Session Check In - 01/29/15 1204    Check-In   Staff Present Heath Lark RN, BSN, CCRP;Kelley Polinsky RN, Moises Blood BS, ACSM CEP Exercise Physiologist   ER physicians immediately available to respond to emergencies [facility] immediately available ER MD   Physician(s) Dr. Jacqualine Code and Dr. Kerman Passey    Medication changes reported     No   Fall or balance concerns reported    No   VAD Patient? No   Pain Assessment   Currently in Pain? No/denies         Goals Met:  Proper associated with RPD/PD & O2 Sat Exercise tolerated well  Goals Unmet:  Not Applicable  Goals Comments: Reminded Mr Fedor to not talk so much while exercising - low O2Sat's when he talks with his exercise goals.   Dr. Emily Filbert is Medical Director for [place] and [facility].

## 2015-01-31 ENCOUNTER — Encounter: Payer: Medicare Other | Admitting: *Deleted

## 2015-01-31 DIAGNOSIS — G4733 Obstructive sleep apnea (adult) (pediatric): Secondary | ICD-10-CM

## 2015-01-31 DIAGNOSIS — J449 Chronic obstructive pulmonary disease, unspecified: Secondary | ICD-10-CM

## 2015-01-31 NOTE — Progress Notes (Signed)
Daily Session Note  Patient Details  Name: Mayur Duman MRN: [id number] Date of Birth: [birth year] Referring Provider:  [name], MD  Encounter Date: 01/31/2015  Check In:     Session Check In - 01/31/15 1235    Check-In   Staff Present Carson Myrtle BS, RRT, Respiratory Therapist;Liora Myles Dillard Essex MS, ACSM CEP Exercise Physiologist;Steven Way BS, ACSM EP-C, Exercise Physiologist;Carroll Enterkin RN, BSN   ER physicians immediately available to respond to emergencies [facility] immediately available ER MD   Physician(s) Jimmye Norman and Reita Cliche   Medication changes reported     No   Fall or balance concerns reported    No   Warm-up and Cool-down Performed on first and last piece of equipment   VAD Patient? No   Pain Assessment   Currently in Pain? No/denies   Multiple Pain Sites No           Exercise Prescription Changes - 01/31/15 1200    Exercise Review   Progression Yes   Resistance Training   Training Prescription Yes   Weight 4   Reps 10-12   Treadmill   MPH 1.8   Grade 0   Minutes 12   NuStep   Level 6   Watts 50   Minutes 15   Recumbant Elliptical   Level 6   RPM 50   Minutes 15      Goals Met:  Proper associated with RPD/PD & O2 Sat Independence with exercise equipment Using PLB without cueing & demonstrates good technique Exercise tolerated well Personal goals reviewed Strength training completed today  Goals Unmet:  Not Applicable  Goals Comments: Maintained acceptable O2Sat'sd on all exercise goals.   Dr. Emily Filbert is Medical Director for [place] and [facility].

## 2015-02-02 ENCOUNTER — Encounter: Payer: Medicare Other | Admitting: Respiratory Therapy

## 2015-02-02 DIAGNOSIS — J449 Chronic obstructive pulmonary disease, unspecified: Secondary | ICD-10-CM | POA: Diagnosis not present

## 2015-02-02 DIAGNOSIS — G4730 Sleep apnea, unspecified: Secondary | ICD-10-CM

## 2015-02-02 NOTE — Progress Notes (Signed)
Daily Session Note  Patient Details  Name: Miguel Palmer MRN: [id number] Date of Birth: 11/04/1940 Referring Provider:  [name], MD  Encounter Date: 02/02/2015  Check In:     Session Check In - 02/02/15 1605    Check-In   Staff Present Frederich Cha RRT, RCP Respiratory Therapist;Carroll Enterkin RN, Drusilla Kanner MS, ACSM CEP Exercise Physiologist   Physician(s) Dr. Jacqualine Code and Corky Downs   Medication changes reported     No   Fall or balance concerns reported    No   Warm-up and Cool-down Performed on first and last piece of equipment   VAD Patient? No   Pain Assessment   Currently in Pain? No/denies         Goals Met:  Proper associated with RPD/PD & O2 Sat Independence with exercise equipment Using PLB without cueing & demonstrates good technique Exercise tolerated well Personal goals reviewed Strength training completed today  Goals Unmet:  Not Applicable  Goals Comments:    Dr. Emily Filbert is Medical Director for [place] and [facility].

## 2015-02-05 ENCOUNTER — Encounter: Payer: Medicare Other | Admitting: *Deleted

## 2015-02-05 DIAGNOSIS — G4730 Sleep apnea, unspecified: Secondary | ICD-10-CM

## 2015-02-05 DIAGNOSIS — J449 Chronic obstructive pulmonary disease, unspecified: Secondary | ICD-10-CM | POA: Diagnosis not present

## 2015-02-05 NOTE — Progress Notes (Signed)
Pulmonary Individual Treatment Plan  Patient Details  Name: Miguel Palmer MRN: [id number] Date of Birth: [birth year] Referring Provider:  [name], MD  Initial Encounter Date: 10/31/2014  Visit Diagnosis: Sleep apnea  Patient's Home Medications on Admission:  Current outpatient prescriptions:    aspirin 81 MG tablet, Take 81 mg by mouth every other day., Disp: , Rfl:    co-enzyme Q-10 30 MG capsule, Take 30 mg by mouth 2 (two) times daily., Disp: , Rfl:    dextroamphetamine (DEXEDRINE SPANSULE) 15 MG 24 hr capsule, Take 15 mg by mouth daily., Disp: , Rfl:    gabapentin (NEURONTIN) 400 MG capsule, Take by mouth., Disp: , Rfl:    Ginkgo Biloba 60 MG TABS, Take by mouth., Disp: , Rfl:    Glucosamine Sulfate 1000 MG CAPS, Take by mouth., Disp: , Rfl:    isosorbide mononitrate (IMDUR) 30 MG 24 hr tablet, TAKE 1 TABLET BY MOUTH EVERY DAY, Disp: , Rfl:    metFORMIN (GLUCOPHAGE) 500 MG tablet, TAKE 1 TABLET BY MOUTH DAILY., Disp: , Rfl:    metoprolol succinate (TOPROL-XL) 25 MG 24 hr tablet, TAKE 1 TABLET BY MOUTH EVERY DAY, Disp: , Rfl:    modafinil (PROVIGIL) 200 MG tablet, Take by mouth., Disp: , Rfl:    Multiple Vitamins-Minerals (MULTIVITAMIN & MINERAL PO), Take by mouth., Disp: , Rfl:    niacin (NIASPAN) 1000 MG CR tablet, Take by mouth., Disp: , Rfl:    nitroGLYCERIN (NITROSTAT) 0.4 MG SL tablet, Place under the tongue., Disp: , Rfl:    prasugrel (EFFIENT) 10 MG TABS tablet, TAKE 1 TABLET BY MOUTH EVERY DAY, Disp: , Rfl:    rosuvastatin (CRESTOR) 20 MG tablet, Take by mouth., Disp: , Rfl:    tiotropium (SPIRIVA) 18 MCG inhalation capsule, Place into inhaler and inhale., Disp: , Rfl:   Past Medical History: No past medical history on file.  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         POCT Glucose      11/03/14 1524 11/06/14 1449 11/08/14 1130       POCT Blood Glucose   Pre-Exercise 100 mg/dL 100 mg/dL      Post-Exercise 85 mg/dL 85 mg/dL      Pre-Exercise #2  102 mg/dL      Post-Exercise #2  95 mg/dL  Peanut butter crackers given for after class on the way to lunch      Pre-Exercise #3   96 mg/dL     Post-Exercise #3   99 mg/dL        ADL UCSD:     ADL UCSD      10/31/14 1131 01/03/15 1130     ADL UCSD   ADL Phase Entry Mid    SOB Score total 75 69    Rest 1 1    Walk 1 3    Stairs 4 4    Bath 3 3    Dress 3 3    Shop 3 5        Pulmonary Function Assessment:     Pulmonary Function Assessment - 10/31/14 1129    Pulmonary Function Tests   FVC% 56 %   FEV1% 61 %   FEV1/FVC Ratio 83.86  RV% 64 %   DLCO% 45 %   Breath   Bilateral Breath Sounds Clear   Shortness of Breath No;Yes      Exercise Target Goals:    Exercise Program Goal: Individual exercise prescription set with THRR, safety & activity barriers. Participant demonstrates ability to understand and report RPE using BORG scale, to self-measure pulse accurately, and to acknowledge the importance of the exercise prescription.  Exercise Prescription Goal: Starting with aerobic activity 30 plus minutes a day, 3 days per week for initial exercise prescription. Provide home exercise prescription and guidelines that participant acknowledges understanding prior to discharge.  Activity Barriers & Risk Stratification:     Activity Barriers & Risk Stratification - 10/31/14 1128    Activity Barriers & Risk Stratification   Activity Barriers None      6 Minute Walk:     6 Minute Walk      10/31/14 1702 01/03/15 1225     6 Minute Walk   Phase Initial Mid Program    Distance 940 feet 880 feet    Walk Time 6 minutes 6 minutes    Resting HR 73 bpm 105 bpm    Resting BP 128/76 mmHg 124/70 mmHg    Max Ex. HR 131 bpm 136 bpm    Max Ex.  BP 162/84 mmHg 182/82 mmHg    RPE 15 17    Perceived Dyspnea  4 6    Symptoms No No       Initial Exercise Prescription:     Initial Exercise Prescription - 10/31/14 1700    Date of Initial Exercise Prescription   Date 10/31/14   Treadmill   MPH 1.4   Grade 0   Minutes 10   Bike   Level 1   Watts 30   Minutes 10   Recumbant Bike   Level 1   RPM 30   Watts 20   Minutes 10   NuStep   Level 2   Watts 50   Minutes 10   Arm Ergometer   Level 1   Watts 10   Minutes 10   Arm/Foot Ergometer   Level 1   Watts 12   Minutes 10   Recumbant Elliptical   Level 1   RPM 40   Watts 20   Minutes 10   Elliptical   Level 1   Speed 3   Minutes 1   REL-XR   Level 2   Watts 50   Minutes 10   Prescription Details   Frequency (times per week) 3   Duration Progress to 30 minutes of continuous aerobic without signs/symptoms of physical distress   Intensity   THRR REST +  30   Ratings of Perceived Exertion 11-15   Perceived Dyspnea 2-4   Resistance Training   Training Prescription Yes   Weight 1   Reps 10-12      Exercise Prescription Changes:     Exercise Prescription Changes      11/03/14 1500 11/06/14 1400 11/10/14 1300 11/13/14 1000 11/13/14 1400   Exercise Review   Progression    Yes Yes   Response to Exercise   Blood Pressure (Admit)    130/80 mmHg 130/80 mmHg   Blood Pressure (Exercise)    160/90 mmHg 160/90 mmHg   Blood Pressure (Exit)    142/84 mmHg 142/84 mmHg   Heart Rate (Admit)     82 bpm   Heart Rate (Exercise)     118 bpm   Heart  Rate (Exit)     84 bpm   Oxygen Saturation (Admit)     92 %   Oxygen Saturation (Exercise)     88 %   Oxygen Saturation (Exit)     96 %   Rating of Perceived Exertion (Exercise)     13   Perceived Dyspnea (Exercise)     13   Intensity    THRR unchanged THRR unchanged   Progression    Continue progressive overload as per policy without signs/symptoms or physical distress. Continue progressive overload as per policy  without signs/symptoms or physical distress.   Resistance Training   Training Prescription   Yes  2 lbs. Yes Yes   Weight   _0 Reps    10-12 10-12   Treadmill   MPH  1.4 1.5 1.4 1.4   Grade  0  0 0   Minutes  _1 NuStep   Level _2 Watts 50   30 30   Minutes _3 Recumbant Elliptical   Level  1  1 1.5   RPM  _4 Watts     12   Minutes  _5 12/06/14 1200 12/06/14 1500 12/08/14 1400 12/11/14 1200 01/01/15 1200   Exercise Review   Progression Yes Yes  Yes Yes   Response to Exercise   Blood Pressure (Admit) 130/80 mmHg 148/80 mmHg   118/78 mmHg   Blood Pressure (Exercise) 160/90 mmHg 142/74 mmHg   140/68 mmHg   Blood Pressure (Exit) 142/84 mmHg 140/80 mmHg   122/62 mmHg   Heart Rate (Admit) 82 bpm 95 bpm   80 bpm   Heart Rate (Exercise) 118 bpm 118 bpm   108 bpm   Heart Rate (Exit) 84 bpm 95 bpm   85 bpm   Oxygen Saturation (Admit) 92 % 94 %   91 %  6l/m continuous   Oxygen Saturation (Exercise) 88 % 90 %  6l/m continuous   86 %   Oxygen Saturation (Exit) 96 % 97 %   97 %   Rating of Perceived Exertion (Exercise) _6 Perceived Dyspnea (Exercise) _7 Intensity THRR unchanged       Progression Continue progressive overload as per policy without signs/symptoms or physical distress.       Resistance Training   Training Prescription Yes    Yes   Weight 2    4   Reps 10-12    10-12   Treadmill   MPH 1.4  1.5 1.5 1.5   Grade 0  0 0 0   Minutes _8 NuStep   Level _9 Watts 30   45 50   Minutes 15    15   Recumbant Elliptical   Level _10 RPM 20   60 40   Watts 15       Minutes 10    12     01/15/15 1200 01/17/15 1500 01/22/15 1300 01/31/15 1200 01/31/15 1400   Exercise Review   Progression _11    Response to Exercise   Blood Pressure (Admit) 118/78 mmHg  118/78 mmHg 140/88 mmHg    Blood Pressure (Exercise) 140/68 mmHg  140/68 mmHg  156/80 mmHg    Blood Pressure (Exit)  122/62 mmHg  122/62 mmHg 118/80 mmHg    Heart Rate (Admit) 80 bpm  80 bpm 88 bpm    Heart Rate (Exercise) 108 bpm  108 bpm 122 bpm    Heart Rate (Exit) 85 bpm  85 bpm 98 bpm    Oxygen Saturation (Admit) 91 %  6l/m continuous  91 %  6l/m continuous 93 %    Oxygen Saturation (Exercise) 86 %  86 % 90 %    Oxygen Saturation (Exit) 97 %  97 % 94 %    Rating of Perceived Exertion (Exercise) _0 Perceived Dyspnea (Exercise) _1 Intensity  THRR unchanged      Progression  Continue progressive overload as per policy without signs/symptoms or physical distress.      Resistance Training   Training Prescription Yes Yes Yes Yes    Weight _2 Reps 10-12 10-12 10-12 10-12    Treadmill   MPH 1.5 1.5 1.5 1.8    Grade 0 0 0 0    Minutes _3 NuStep   Level _4 T5    Watts 50  50 80    Minutes _5 Recumbant Elliptical   Level _6 RPM 50  50 50    Minutes    15       Discharge Exercise Prescription (Final Exercise Prescription Changes):     Exercise Prescription Changes - 01/31/15 1400    Exercise Review   Progression Yes       Nutrition:  Target Goals: Understanding of nutrition guidelines, daily intake of sodium <1549m, cholesterol <2017m calories 30% from fat and 7% or less from saturated fats, daily to have 5 or more servings of fruits and vegetables.  Biometrics:     Pre Biometrics - 10/31/14 1705    Pre Biometrics   Height _7  (1.88 m)   Weight (!) 342 lb 9.6 oz (155.402 kg)   Waist Circumference 55 inches   Hip Circumference 56 inches   Waist to Hip Ratio 0.98 %   BMI (Calculated) 44.1       Nutrition Therapy Plan and Nutrition Goals:     Nutrition Therapy & Goals - 12/21/14 1200    Nutrition Therapy   Diet Instructed patient on a 2000 calorie vegan meal plan, DASH diet principles.  He states that he is following Dr. EsMaurine MinisterReverse Heart Disease" diet which is vegan and eliminates all oils but admits he  is having difficulty following it. Discussed the benefits of a vegan diet but stresssed that have to be careful to include adequate protein.   Fiber 20 grams   Whole Grain Foods 4 servings   Protein 8 ounces/day   Saturated Fats 13 max. grams   Fruits and Vegetables 5 servings/day   Personal Nutrition Goals   Personal Goal #1 To space meals 4-6 hours apart.   Personal Goal #2 Include a protein source at every meal. Refer to list given on meat alternatives. Goal of 75 gms protein/day.   Personal Goal #3 Use my Fitness Pal to record food/beverage intake   Personal Goal #4 In addtions to strategies discussed to help with weight loss, refer to "National Weight Loss Registry" for numerous other healthy strategies that participants have used to  help lose and maintain weight loss.        Nutrition Discharge: Rate Your Plate Scores:     Rate Your Plate - 63/78/58 8502    Rate Your Plate Scores   Pre Score 55   Pre Score % 79 %  Due to vegan diet, many of the questions were  N/A      Psychosocial: Target Goals: Acknowledge presence or absence of depression, maximize coping skills, provide positive support system. Participant is able to verbalize types and ability to use techniques and skills needed for reducing stress and depression.  Initial Review & Psychosocial Screening:     Initial Psych Review & Screening - 10/31/14 0900    Initial Review   Current issues with Current Depression;Current Sleep Concerns;Current Stress Concerns   Source of Stress Concerns Chronic Illness;Unable to participate in former interests or hobbies   [place]? Yes   Barriers   Psychosocial barriers to participate in program There are no identifiable barriers or psychosocial needs.;The patient should benefit from training in stress management and relaxation.   Screening Interventions   Interventions Encouraged to exercise;Program counselor consult      Quality of Life  Scores:     Quality of Life - 10/31/14 0900    Quality of Life Scores   Health/Function Pre 11.72 %   Socioeconomic Pre 8.5 %   Psych/Spiritual Pre 16.06 %   Family Pre 10.07 %   GLOBAL Pre 17.4 %      PHQ-9:     Recent Review Flowsheet Data    Depression screen [facility] 2/9 10/31/2014   Decreased Interest 1   Down, Depressed, Hopeless 1   PHQ - 2 Score 2   Altered sleeping 0   Tired, decreased energy 3   Change in appetite 3   Feeling bad or failure about yourself  3   Trouble concentrating 1   Moving slowly or fidgety/restless 2   Suicidal thoughts 1   PHQ-9 Score 15   Difficult doing work/chores Somewhat difficult      Psychosocial Evaluation and Intervention:     Psychosocial Evaluation - 11/08/14 1627    Psychosocial Evaluation & Interventions   Interventions Stress management education;Encouraged to exercise with the program and follow exercise prescription;Relaxation education   Comments Counselor met with Mr. Elison today for initial psychosocial evaluation.  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  He lives with a spouse of 83 years and several friends whom he refers to as part of his support system.  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 Jenetta Downer reporting he doesn't use his CPAP to sleep as it is not user friendly.  Counselor reviewed the PHQ-9 with a score of "17" with Mr. Jenetta Downer confirming all his answers are current and accurate.  When assessed for depression, he denies a history and states any current symptoms are "situational" due to his health issues primarily.  Mr. Jenetta Downer also reports current stressors are health, financial, conflict with his daughter, a close cousin with pancreatic cancer and his inability to do the things that used to bring him pleasure, such as playing the guitar/banjo with friends and playing his harmonica.  Mr. Colman Cater goals for  this program are to breathe better, lose some weight, increase his physical strength and be stronger and more able to walk/go shopping without being "  tied to this big oxygen tank."  Counselor discussed Mr. Jenetta Downer mentioning to his Dr. for a medication evaluation for his depressive symptoms and possibly seeing a therpist for support currently.  He preferred to wait to see if this program helps him feel better/stronger first.  Counselor will re-assess over the next few weeks and make further recommendations at that time.      Continued Psychosocial Services Needed Yes  Mr. O will benefit from all the educational components of this program, and participated in Relaxation education on this date.  He will benefit from meeting with a dietician, and attending the depression and stress management educational components.      Psychosocial Re-Evaluation:  Education: Education Goals: Education classes will be provided on a weekly basis, covering required topics. Participant will state understanding/return demonstration of topics presented.  Learning Barriers/Preferences:     Learning Barriers/Preferences - 10/31/14 1128    Learning Barriers/Preferences   Learning Barriers None   Learning Preferences Group Instruction;Individual Instruction;Pictoral;Written Material;Video;Verbal Instruction;Skilled Demonstration      Education Topics: Initial Evaluation Education: - Verbal, written and demonstration of respiratory meds, RPE/PD scales, oximetry and breathing techniques. Instruction on use of nebulizers and MDIs: cleaning and proper use, rinsing mouth with steroid doses and importance of monitoring MDI activations.          Pulmonary Rehab from 02/05/2015 in [place]   Date  10/31/14   Educator  LB   Instruction Review Code  2- meets goals/outcomes      General Nutrition Guidelines/Fats and Fiber: -Group instruction provided by verbal, written material, models  and posters to present the general guidelines for heart healthy nutrition. Gives an explanation and review of dietary fats and fiber.      Pulmonary Rehab from 02/05/2015 in [place]   Date  02/05/15   Educator  C. Joneen Caraway, RD   Instruction Review Code  2- meets goals/outcomes      Controlling Sodium/Reading Food Labels: -Group verbal and written material supporting the discussion of sodium use in heart healthy nutrition. Review and explanation with models, verbal and written materials for utilization of the food label.   Exercise Physiology & Risk Factors: - Group verbal and written instruction with models to review the exercise physiology of the cardiovascular system and associated critical values. Details cardiovascular disease risk factors and the goals associated with each risk factor.   Aerobic Exercise & Resistance Training: - Gives group verbal and written discussion on the health impact of inactivity. On the components of aerobic and resistive training programs and the benefits of this training and how to safely progress through these programs.      Pulmonary Rehab from 02/05/2015 in [place]   Date  12/27/14   Educator  SW   Instruction Review Code  2- meets goals/outcomes      Flexibility, Balance, General Exercise Guidelines: - Provides group verbal and written instruction on the benefits of flexibility and balance training programs. Provides general exercise guidelines with specific guidelines to those with heart or lung disease. Demonstration and skill practice provided.      Pulmonary Rehab from 02/05/2015 in [place]   Date  01/24/15   Educator  SW   Instruction Review Code  2- meets goals/outcomes      Stress Management: - Provides group verbal and written instruction about the health risks of elevated stress, cause of high stress, and  healthy ways  to reduce stress.      Pulmonary Rehab from 02/05/2015 in [place]   Date  01/17/15   Educator  [facility]   Instruction Review Code  2- meets goals/outcomes      Depression: - Provides group verbal and written instruction on the correlation between heart/lung disease and depressed mood, treatment options, and the stigmas associated with seeking treatment.      Pulmonary Rehab from 02/05/2015 in [place]   Date  12/06/14   Educator  Berle Mull, MSW   Instruction Review Code  2- meets goals/outcomes      Exercise & Equipment Safety: - Individual verbal instruction and demonstration of equipment use and safety with use of the equipment.      Pulmonary Rehab from 02/05/2015 in [place]   Date  01/15/15   Educator  L. Owens Shark, RRT [facility] Joyce]   Instruction Review Code  2- meets goals/outcomes      Infection Prevention: - Provides verbal and written material to individual with discussion of infection control including proper hand washing and proper equipment cleaning during exercise session.      Pulmonary Rehab from 02/05/2015 in [place]   Date  11/03/14   Educator  Jacqulyn Ducking   Instruction Review Code  2- meets goals/outcomes      Falls Prevention: - Provides verbal and written material to individual with discussion of falls prevention and safety.   Diabetes: - Individual verbal and written instruction to review signs/symptoms of diabetes, desired ranges of glucose level fasting, after meals and with exercise. Advice that pre and post exercise glucose checks will be done for 3 sessions at entry of program.      Pulmonary Rehab from 02/05/2015 in [place]   Date  12/01/14   Educator  C. Enterkin   Instruction Review Code  2- meets goals/outcomes      Chronic Lung Diseases: - Group  verbal and written instruction to review new updates, new respiratory medications, new advancements in procedures and treatments. Provide informative websites and "800" numbers of self-education.      Pulmonary Rehab from 02/05/2015 in [place]   Date  01/29/15   Educator  C. [place]   Instruction Review Code  2- meets goals/outcomes      Lung Procedures: - Group verbal and written instruction to describe testing methods done to diagnose lung disease. Review the outcome of test results. Describe the treatment choices: Pulmonary Function Tests, ABGs and oximetry.      Pulmonary Rehab from 02/05/2015 in [place]   Date  01/26/15   Educator  [place]   Instruction Review Code  2- meets goals/outcomes      Energy Conservation: - Provide group verbal and written instruction for methods to conserve energy, plan and organize activities. Instruct on pacing techniques, use of adaptive equipment and posture/positioning to relieve shortness of breath.      Pulmonary Rehab from 02/05/2015 in [place]   Date  11/15/14   Educator  SW   Instruction Review Code  2- meets goals/outcomes      Triggers: - Group verbal and written instruction to review types of environmental controls: home humidity, furnaces, filters, dust mite/pet prevention, HEPA vacuums. To discuss weather changes, air quality and the benefits of nasal washing.  Pulmonary Rehab from 02/05/2015 in [place]   Date  01/01/15   Educator  LB   Instruction Review Code  2- meets goals/outcomes      Exacerbations: - Group verbal and written instruction to provide: warning signs, infection symptoms, calling MD promptly, preventive modes, and value of vaccinations. Review: effective airway clearance, coughing and/or vibration techniques. Create an Sports administrator.   Oxygen: - Individual  and group verbal and written instruction on oxygen therapy. Includes supplement oxygen, available portable oxygen systems, continuous and intermittent flow rates, oxygen safety, concentrators, and Medicare reimbursement for oxygen.      Pulmonary Rehab from 02/05/2015 in [place]   Date  10/31/14   Educator  LB   Instruction Review Code  2- meets goals/outcomes      Respiratory Medications: - Group verbal and written instruction to review medications for lung disease. Drug class, frequency, complications, importance of spacers, rinsing mouth after steroid MDI's, and proper cleaning methods for nebulizers.      Pulmonary Rehab from 02/05/2015 in [place]   Date  10/31/14   Educator  LB   Instruction Review Code  2- meets goals/outcomes      AED/CPR: - Group verbal and written instruction with the use of models to demonstrate the basic use of the AED with the basic ABC's of resuscitation.      Pulmonary Rehab from 02/05/2015 in [place]   Date  02/02/15   Educator  CE   Instruction Review Code  2- meets goals/outcomes      Breathing Retraining: - Provides individuals verbal and written instruction on purpose, frequency, and proper technique of diaphragmatic breathing and pursed-lipped breathing. Applies individual practice skills.      Pulmonary Rehab from 02/05/2015 in [place]   Date  10/31/14   Educator  LB   Instruction Review Code  R- Review/reinforce      Anatomy and Physiology of the Lungs: - Group verbal and written instruction with the use of models to provide basic lung anatomy and physiology related to function, structure and complications of lung disease.   Heart Failure: - Group verbal and written instruction on the basics of heart failure: signs/symptoms, treatments, explanation of ejection fraction, enlarged  heart and cardiomyopathy.      Pulmonary Rehab from 02/05/2015 in [place]   Date  01/12/15   Educator  Frederich Cha, RRT   Instruction Review Code  2- meets goals/outcomes      Sleep Apnea: - Individual verbal and written instruction to review Obstructive Sleep Apnea. Review of risk factors, methods for diagnosing and types of masks and machines for OSA.   Anxiety: - Provides group, verbal and written instruction on the correlation between heart/lung disease and anxiety, treatment options, and management of anxiety.   Relaxation: - Provides group, verbal and written instruction about the benefits of relaxation for patients with heart/lung disease. Also provides patients with examples of relaxation techniques.      Pulmonary Rehab from 02/05/2015 in [place]   Date  11/08/14   Educator  Lucianne Lei LCSW   Instruction Review Code  2- Meets goals/outcomes      Knowledge Questionnaire Score:     Knowledge Questionnaire Score - 10/31/14 1129    Knowledge Questionnaire Score   Pre Score -5      Personal  Goals and Risk Factors at Admission:     Personal Goals and Risk Factors at Admission - 10/31/14 1144    Personal Goals and Risk Factors on Admission    Weight Management Yes   Intervention Learn and follow the exercise and diet guidelines while in the program. Utilize the nutrition and education classes to help gain knowledge of the diet and exercise expectations in the program   Admit Weight 342 lb (155.13 kg)   Goal Weight 210 lb (95.255 kg)   Increase Aerobic Exercise and Physical Activity Yes   Intervention While in program, learn and follow the exercise prescription taught. Start at a low level workload and increase workload after able to maintain previous level for 30 minutes. Increase time before increasing intensity.   Understand more about Heart/Pulmonary Disease. Yes   Intervention  While in program utilize professionals for any questions, and attend the education sessions. Great websites to use are www.americanheart.org or www.lung.org for reliable information.   Improve shortness of breath with ADL's Yes   Intervention While in program, learn and follow the exercise prescription taught. Start at a low level workload and increase workload ad advised by the exercise physiologist. Increase time before increasing intensity.   Develop more efficient breathing techniques such as purse lipped breathing and diaphragmatic breathing; and practicing self-pacing with activity Yes   Intervention While in program, learn and utilize the specific breathing techniques taught to you. Continue to practice and use the techniques as needed.   Increase knowledge of respiratory medications and ability to use respiratory devices properly.  Yes   Diabetes Yes   Goal Blood glucose control identified by blood glucose values, HgbA1C. Participant verbalizes understanding of the signs/symptoms of hyper/hypo glycemia, proper foot care and importance of medication and nutrition plan for blood glucose control.   Intervention Provide nutrition & aerobic exercise along with prescribed medications to achieve blood glucose in normal ranges: Fasting 65-99 mg/dL   Hypertension Yes   Goal Participant will see blood pressure controlled within the values of 140/56m/Hg or within value directed by their physician.   Intervention Provide nutrition & aerobic exercise along with prescribed medications to achieve BP 140/90 or less.   Lipids Yes   Goal Cholesterol controlled with medications as prescribed, with individualized exercise RX and with personalized nutrition plan. Value goals: LDL < 725m HDL > 4080mParticipant states understanding of desired cholesterol values and following prescriptions.   Intervention Provide nutrition & aerobic exercise along with prescribed medications to achieve LDL <1m10mDL >40mg19m Stress Yes   Goal To meet with psychosocial counselor for stress and relaxation information and guidance. To state understanding of performing relaxation techniques and or identifying personal stressors.   Intervention Provide education on types of stress, identifiying stressors, and ways to cope with stress. Provide demonstration and active practice of relaxation techniques.   Personal Goal Other Yes   Personal Goal "I want to sing without shortness of breath."   Intervention Other  Reduce shortness of breath with exercise and pursed- lip breathing   Intervention While in program learn and demonstrate appropriate use of your oxygen therapy by increasing flow with exertion, manage oxygen tank operation, including continuous and intermittent flow.  Understanding oxygen is a drug ordered by your physician.      Personal Goals and Risk Factors Review:      Goals and Risk Factor Review      11/15/14 1130 11/29/14 1130 12/06/14 1130 12/20/14 1248 12/20/14 1548  Weight Management   Goals Progress/Improvement seen Yes       Comments Mr Merrick has filled out the dietitian's questionnaire; it was faxed to her; and he will meet with her after the holiday.       Increase Aerobic Exercise and Physical Activity   Goals Progress/Improvement seen    Yes     Comments   Mr Smestad has had increases with his exercise goals on the TM, T5, and TM.     Improve shortness of breath with ADL's   Goals Progress/Improvement seen    Yes     Comments   In working with Mr Daubenspeck on his oxygen and O2Sat's, he has been able to increase his exercise goals which help improve his shortness of breath, especially with the correct oxygen flow rates.     Breathing Techniques   Goals Progress/Improvement seen    Yes  Yes   Comments   Mr Geil works very hard to incorporate PLB with his exercise.  Encouraging Mr Tucker to use PLB with his exercise in [place]. With his low O2Sat's on the TM today, he improved his readings with the PLB. He  states he really has to make himself breath deep. Encouraged him to use PLB  when walking into [place] and at his apartment in the long hallway to elevator.   Increase knowledge of respiratory medications   Goals Progress/Improvement seen   Yes      Comments  Worked with Mr Power and portable oxygen; I had him use the Eclipse which is a Systems developer and used in our gym; He was not able to maintain O2Sat's on the 6l/m intermittent and we switched him to the 6l/m gas portable with continuous flow.  I also gave him a copy of a Pulmonary PPaper which had a great article on portable concentrators.      Other Goal   Goals Progress/Improvement seen     Yes    Comments    Discussed with Joshuan about a piece of exercise equipment he has avaiable for home use and the benefits of it.      01/01/15 1234 01/08/15 1547 01/15/15 1130 01/19/15 1130 01/24/15 1130   Weight Management   Goals Progress/Improvement seen Yes  No     Comments ongoing, would like to lose 150 lbs.  Asked  Mr Tarte if he had an appointment with the dietitian yet; he said he did meet with her but continues to struggle with his diet - large portion sizes and snacking late at night; his weight is averaging  340lbs.  He has been on a [employer]  since 2006 and has helped his cardiac health.     Increase Aerobic Exercise and Physical Activity   Goals Progress/Improvement seen  Yes   Yes    Comments continue to make gains on the treadmill   Mr Salce states his endurance is increasing. 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 He still needs to find a oxygen portable concentrator that will work for this that would cover the time involved.    Improve shortness of breath with ADL's   Goals Progress/Improvement seen  Yes Yes   Yes   Comments  Mr Odonovan is managing his shortness of breath in [facility] by managing  his oxygen and adjusting the flow rate to 6l/m with exercise and with some activites at home  activities   Mr Ellenwood brought his harmonica  in to class and played it for the class. We talked about the benefits of playing the harmonica on inspiratory and expiratory muscle strength and PLB technique.   Breathing Techniques   Goals Progress/Improvement seen  Yes       Comments wants to improve, also incorporate meditation       Increase knowledge of respiratory medications   Goals Progress/Improvement seen   Yes      Comments  Mr Portlock has a good understanding of his Spiriva; he states that he is not sure "it works", but explained the importance of Spiriva, especially with activity.      Stress   Goal To meet with psychosocial counselor for stress and relaxation information and guidance. To state understanding of performing relaxation techniques and or identifying personal stressors.         01/24/15 1330 01/29/15 1550         Understand more about Heart/Pulmonary Disease   Goals Progress/Improvement seen  Yes       Comments Has enjoyed all the education classes, has trouble retaining information or recalling information, but he still enjoys the sessions and feels like they are helping him better manage his disease.        Improve shortness of breath with ADL's   Goals Progress/Improvement seen  Yes       Comments Has not seen much improvement in SOB since last month, but is still happy with his progress in the program.        Breathing Techniques   Goals Progress/Improvement seen  Yes       Comments Uses PLB when his O2 saturation is low or when he feels short of breath. He especially utilizes this during class while exercising, but also uses it around the house and when he is out.        Increase knowledge of respiratory medications   Goals Progress/Improvement seen   Yes      Comments  Discussed with Mr Innes about use an oral device with his sleep apnea, since he can not tolerate the CPAP masks, he was open to it and gave him a list of dentists in the area who are certiified in the  oral devices.         Personal Goals Discharge:    Comments: Notification of 30 day evaluation in [facility]; no response necessary.

## 2015-02-05 NOTE — Progress Notes (Signed)
Daily Session Note  Patient Details  Name: Miguel Palmer MRN: [id number] Date of Birth: July 10, 1940 Referring Provider:  [name], MD  Encounter Date: 02/05/2015  Check In:     Session Check In - 02/05/15 1202    Check-In   Staff Present Carson Myrtle BS, RRT, Respiratory Therapist;Nochum Fenter RN, BSN;Kelly Hayes BS, ACSM CEP Exercise Physiologist   ER physicians immediately available to respond to emergencies [facility] immediately available ER MD   Physician(s) Dr. Karma Greaser and Dr. Jimmye Norman   Medication changes reported     No   Fall or balance concerns reported    No   Warm-up and Cool-down Performed on first and last piece of equipment   VAD Patient? No   Pain Assessment   Currently in Pain? No/denies         Goals Met:  Proper associated with RPD/PD & O2 Sat Exercise tolerated well  Goals Unmet:  Not Applicable  Goals Comments: [name] checked with his insurance and he does not have silver Sneakers.   Dr. Emily Filbert is Medical Director for [place] and [facility].

## 2015-02-07 DIAGNOSIS — G4730 Sleep apnea, unspecified: Secondary | ICD-10-CM

## 2015-02-07 DIAGNOSIS — J449 Chronic obstructive pulmonary disease, unspecified: Secondary | ICD-10-CM

## 2015-02-07 NOTE — Progress Notes (Signed)
Daily Session Note  Patient Details  Name: Miguel Palmer MRN: [id number] Date of Birth: [birth year] Referring Provider:  [name], MD  Encounter Date: 02/07/2015  Check In:     Session Check In - 02/07/15 1259    Check-In   Staff Present Lestine Box BS, ACSM EP-C, Exercise Physiologist;Laureen Janell Quiet, RRT, Respiratory Therapist   ER physicians immediately available to respond to emergencies [facility] Palmer immediately available ER MD   Physician(s) Miguel Palmer and Miguel Palmer   Medication changes reported     No   Fall or balance concerns reported    No   Warm-up and Cool-down Performed on first and last piece of equipment   VAD Patient? No   Pain Assessment   Currently in Pain? No/denies         Goals Met:  Proper associated with RPD/PD & O2 Sat Exercise tolerated well No report of cardiac concerns or symptoms Strength training completed today  Goals Unmet:  Not Applicable  Goals Comments: Miguel Palmer will be graduating soon and is planning where to continue his exercise - [place] is an option.   Dr. Emily Palmer is Medical Director for [facility].

## 2015-02-09 ENCOUNTER — Encounter: Payer: Medicare Other | Admitting: *Deleted

## 2015-02-09 DIAGNOSIS — J449 Chronic obstructive pulmonary disease, unspecified: Secondary | ICD-10-CM

## 2015-02-09 NOTE — Progress Notes (Signed)
Daily Session Note  Patient Details  Name: Miguel Palmer MRN: [id number] Date of Birth: 11-27-1940 Referring Provider:  [name], MD  Encounter Date: 02/09/2015  Check In:     Session Check In - 02/09/15 1153    Check-In   Staff Present Frederich Cha RRT, RCP Respiratory Therapist;Myrta Mercer RN, BSN  Hessie Knows   Medication changes reported     No   Fall or balance concerns reported    No   Warm-up and Cool-down Performed on first and last piece of equipment   VAD Patient? No   Pain Assessment   Currently in Pain? No/denies         Goals Met:  Proper associated with RPD/PD & O2 Sat Exercise tolerated well  Goals Unmet:  Not Applicable  Goals Comments: Doing well. Had 6 minute walk today.    Dr. Emily Filbert is Medical Director for [facility] and [facility].

## 2015-02-12 ENCOUNTER — Encounter: Payer: Medicare Other | Admitting: *Deleted

## 2015-02-12 DIAGNOSIS — J449 Chronic obstructive pulmonary disease, unspecified: Secondary | ICD-10-CM | POA: Diagnosis not present

## 2015-02-12 DIAGNOSIS — G4730 Sleep apnea, unspecified: Secondary | ICD-10-CM

## 2015-02-12 NOTE — Progress Notes (Signed)
Daily Session Note  Patient Details  Name: Miguel Palmer MRN: [id number] Date of Birth: July 14, 1940 Referring Provider:  [name], MD  Encounter Date: 02/12/2015  Check In:     Session Check In - 02/12/15 1158    Check-In   Staff Present Laureen Owens Shark BS, RRT, Respiratory Therapist;Onesha Krebbs RN, BSN;Steven Way BS, ACSM EP-C, Exercise Physiologist   ER physicians immediately available to respond to emergencies [facility] immediately available ER MD   Physician(s) Dr. Marcelene [place] and Dr. Thomasene Lot    Medication changes reported     No   Fall or balance concerns reported    No   Warm-up and Cool-down Performed on first and last piece of equipment   VAD Patient? No   Pain Assessment   Currently in Pain? No/denies           Exercise Prescription Changes - 02/12/15 1200    Exercise Review   Progression Yes   NuStep   Level 7  T5 Nustep   Watts 60      Goals Met:  Proper associated with RPD/PD & O2 Sat Exercise tolerated well  Goals Unmet:  Not Applicable  Goals Comments: Will increase T5 to level 7.   Dr. Emily Filbert is Medical Director for [place] and [facility].

## 2015-02-14 ENCOUNTER — Encounter: Payer: Medicare Other | Admitting: *Deleted

## 2015-02-14 DIAGNOSIS — J449 Chronic obstructive pulmonary disease, unspecified: Secondary | ICD-10-CM | POA: Diagnosis not present

## 2015-02-14 DIAGNOSIS — G4733 Obstructive sleep apnea (adult) (pediatric): Secondary | ICD-10-CM

## 2015-02-14 NOTE — Progress Notes (Signed)
Daily Session Note  Patient Details  Name: Miguel Palmer MRN: [id number] Date of Birth: [birth year] Referring Provider:  [name], MD  Encounter Date: 02/14/2015  Check In:     Session Check In - 02/14/15 1213    Check-In   Staff Present Candiss Norse MS, ACSM CEP Exercise Physiologist;Steven Way BS, ACSM EP-C, Exercise Physiologist   ER physicians immediately available to respond to emergencies [facility] immediately available ER MD   Physician(s) Dr. Joni Fears and Dr. Reita Cliche   Medication changes reported     No   Fall or balance concerns reported    No   Warm-up and Cool-down Performed on first and last piece of equipment   Pain Assessment   Currently in Pain? No/denies         Goals Met:  Proper associated with RPD/PD & O2 Sat Exercise tolerated well  Goals Unmet:  O2 Sat  Goals Comments: Oxygen Sats on 6 liters when he talks drops to 84% but when he doesn't talk and deep breathing his pulse ox will increase to 90% on 6 liters of supplemental oxgyen.    Dr. Emily Filbert is Medical Director for [facility] and [facility].

## 2015-02-16 ENCOUNTER — Encounter: Payer: Medicare Other | Admitting: Respiratory Therapy

## 2015-02-16 NOTE — Progress Notes (Signed)
Entering values from POST questionnaires.

## 2015-02-19 ENCOUNTER — Encounter: Payer: Medicare Other | Admitting: *Deleted

## 2015-02-19 DIAGNOSIS — J449 Chronic obstructive pulmonary disease, unspecified: Secondary | ICD-10-CM | POA: Diagnosis not present

## 2015-02-19 DIAGNOSIS — G4733 Obstructive sleep apnea (adult) (pediatric): Secondary | ICD-10-CM

## 2015-02-19 NOTE — Progress Notes (Signed)
Daily Session Note  Patient Details  Name: Sebastian Lurz MRN: [id number] Date of Birth: 10/12/1940 Referring Provider:  [name], MD  Encounter Date: 02/19/2015  Check In:     Session Check In - 02/19/15 1242    Check-In   Staff Present Carson Myrtle BS, RRT, Respiratory Therapist;Jeri Jeanbaptiste RN, BSN;Steven Way BS, ACSM EP-C, Exercise Physiologist   ER physicians immediately available to respond to emergencies See telemetry face sheet for immediately available ER MD   Physician(s) Dr. Joni Fears, Dr. Junita Push   Medication changes reported     No   Fall or balance concerns reported    No   Warm-up and Cool-down Performed on first and last piece of equipment   VAD Patient? No   Pain Assessment   Currently in Pain? No/denies         Goals Met:  Proper associated with RPD/PD & O2 Sat Exercise tolerated well  Goals Unmet:  Not Applicable  Goals Comments: Has 2 tanks of oxygen when he arrives to [facility] to make sure he has enough to get home since he is on 6 liters supplemental oxgyen.       Mr Mucci is requesting additional sessions in [place]; his insurance company called and stated that they would continue to pay for his pulmonary rehab; I have sent a referral to Dr [name] for medical neccesity.   Dr. Emily Filbert is Medical Director for [place] and [facility].

## 2015-02-20 ENCOUNTER — Encounter: Payer: Self-pay | Admitting: Internal Medicine

## 2015-02-20 DIAGNOSIS — G4730 Sleep apnea, unspecified: Secondary | ICD-10-CM

## 2015-02-20 DIAGNOSIS — J449 Chronic obstructive pulmonary disease, unspecified: Secondary | ICD-10-CM

## 2015-02-23 ENCOUNTER — Encounter: Payer: Medicare Other | Attending: Family Medicine | Admitting: *Deleted

## 2015-02-23 DIAGNOSIS — J449 Chronic obstructive pulmonary disease, unspecified: Secondary | ICD-10-CM | POA: Insufficient documentation

## 2015-02-23 DIAGNOSIS — G4730 Sleep apnea, unspecified: Secondary | ICD-10-CM

## 2015-02-23 DIAGNOSIS — G4733 Obstructive sleep apnea (adult) (pediatric): Secondary | ICD-10-CM

## 2015-02-23 NOTE — Progress Notes (Signed)
Daily Session Note  Patient Details  Name: Miguel Palmer MRN: [id number] Date of Birth: 03/16/1941 Referring Provider:  [name], MD  Encounter Date: 02/23/2015  Check In:     Session Check In - 02/23/15 1216    Check-In   Staff Present Frederich Cha RRT, RCP Respiratory Therapist;[place] Harral Dillard Essex MS, ACSM CEP Exercise Physiologist;Carroll Enterkin RN, BSN   ER physicians immediately available to respond to emergencies [facility] immediately available ER MD   Physician(s) Jacqualine Code and Edd Fabian   Medication changes reported     No   Fall or balance concerns reported    No   Warm-up and Cool-down Performed on first and last piece of equipment   VAD Patient? No   Pain Assessment   Currently in Pain? No/denies   Multiple Pain Sites No         Goals Met:  Proper associated with RPD/PD & O2 Sat Independence with exercise equipment Using PLB without cueing & demonstrates good technique Exercise tolerated well Personal goals reviewed Strength training completed today  Goals Unmet:  Not Applicable  Goals Comments: Graduating from program today.Home exercise plans were discussed. Details of the patient's exercise prescription and what they need to do in order to continue the prescription and progress with exercise were outlined and the patient verbalized understanding. The patient plans to complete another 36 sessions of pulmonary rehab or join the independent fitness center.    Dr. Emily Filbert is Medical Director for [facility].

## 2015-02-27 ENCOUNTER — Other Ambulatory Visit: Payer: Self-pay | Admitting: Family Medicine

## 2015-02-27 DIAGNOSIS — G4734 Idiopathic sleep related nonobstructive alveolar hypoventilation: Secondary | ICD-10-CM

## 2015-02-27 DIAGNOSIS — J449 Chronic obstructive pulmonary disease, unspecified: Secondary | ICD-10-CM

## 2015-02-28 ENCOUNTER — Encounter: Payer: Medicare Other | Attending: Family Medicine

## 2015-02-28 ENCOUNTER — Other Ambulatory Visit: Payer: Self-pay | Admitting: Family Medicine

## 2015-02-28 DIAGNOSIS — R042 Hemoptysis: Secondary | ICD-10-CM

## 2015-02-28 DIAGNOSIS — J449 Chronic obstructive pulmonary disease, unspecified: Secondary | ICD-10-CM

## 2015-02-28 DIAGNOSIS — G4734 Idiopathic sleep related nonobstructive alveolar hypoventilation: Secondary | ICD-10-CM

## 2015-02-28 DIAGNOSIS — J42 Unspecified chronic bronchitis: Secondary | ICD-10-CM | POA: Insufficient documentation

## 2015-03-02 ENCOUNTER — Encounter: Payer: Medicare Other | Admitting: *Deleted

## 2015-03-02 VITALS — BP 146/84 | HR 93 | Wt 343.9 lb

## 2015-03-02 DIAGNOSIS — J449 Chronic obstructive pulmonary disease, unspecified: Secondary | ICD-10-CM | POA: Diagnosis not present

## 2015-03-02 DIAGNOSIS — J42 Unspecified chronic bronchitis: Secondary | ICD-10-CM | POA: Diagnosis present

## 2015-03-02 NOTE — Progress Notes (Signed)
Daily Session Note  Patient Details  Name: Miguel Palmer MRN: [id number] Date of Birth: 02-Mar-1941 Referring Provider:  [name], MD  Encounter Date: 03/02/2015  Check In:     Session Check In - 03/02/15 1244    Check-In   Staff Present Carson Myrtle BS, RRT, Respiratory Therapist;Stacey Blanch Media RRT, RCP Respiratory Therapist;Yogi Arther Dillard Essex MS, ACSM CEP Exercise Physiologist;Susanne Bice RN, BSN, CCRP   ER physicians immediately available to respond to emergencies [facility] immediately available ER MD   Physician(s) Jacqualine Code and Joni Fears   Medication changes reported     No   Fall or balance concerns reported    No   Warm-up and Cool-down Performed on first and last piece of equipment   VAD Patient? No   Pain Assessment   Currently in Pain? No/denies   Multiple Pain Sites No         Goals Met:  Proper associated with RPD/PD & O2 Sat Independence with exercise equipment Using PLB without cueing & demonstrates good technique Exercise tolerated well Strength training completed today  Goals Unmet:  Not Applicable  Goals Comments: [name] is excited to have more visits approved for rehab. He was able to continue all of his previous goals from his last pulmonary visit and did well with all exercise.    Dr. Emily Filbert is Medical Director for [facility] and [facility].

## 2015-03-05 ENCOUNTER — Other Ambulatory Visit: Payer: Self-pay | Admitting: *Deleted

## 2015-03-05 ENCOUNTER — Encounter: Payer: Self-pay | Admitting: Respiratory Therapy

## 2015-03-05 DIAGNOSIS — J449 Chronic obstructive pulmonary disease, unspecified: Secondary | ICD-10-CM

## 2015-03-05 DIAGNOSIS — G4733 Obstructive sleep apnea (adult) (pediatric): Secondary | ICD-10-CM

## 2015-03-05 DIAGNOSIS — G4730 Sleep apnea, unspecified: Secondary | ICD-10-CM

## 2015-03-05 NOTE — Progress Notes (Signed)
Pulmonary Individual Treatment Plan  Patient Details  Name: Miguel Palmer MRN: [id number] Date of Birth: 09-Feb-1941 Referring Provider:  [name], MD  Initial Encounter Date: 10/31/2014 Visit Diagnosis: Chronic obstructive pulmonary disease, unspecified COPD, unspecified chronic bronchitis type  Patient's Home Medications on Admission:  Current outpatient prescriptions:  .  aspirin 81 MG tablet, Take 81 mg by mouth every other day., Disp: , Rfl:  .  co-enzyme Q-10 30 MG capsule, Take 30 mg by mouth 2 (two) times daily., Disp: , Rfl:  .  dextroamphetamine (DEXEDRINE SPANSULE) 15 MG 24 hr capsule, Take 15 mg by mouth daily., Disp: , Rfl:  .  gabapentin (NEURONTIN) 400 MG capsule, Take by mouth., Disp: , Rfl:  .  Ginkgo Biloba 60 MG TABS, Take by mouth., Disp: , Rfl:  .  Glucosamine Sulfate 1000 MG CAPS, Take by mouth., Disp: , Rfl:  .  isosorbide mononitrate (IMDUR) 30 MG 24 hr tablet, TAKE 1 TABLET BY MOUTH EVERY DAY, Disp: , Rfl:  .  metFORMIN (GLUCOPHAGE) 500 MG tablet, TAKE 1 TABLET BY MOUTH DAILY., Disp: , Rfl:  .  metoprolol succinate (TOPROL-XL) 25 MG 24 hr tablet, TAKE 1 TABLET BY MOUTH EVERY DAY, Disp: , Rfl:  .  modafinil (PROVIGIL) 200 MG tablet, Take by mouth., Disp: , Rfl:  .  Multiple Vitamins-Minerals (MULTIVITAMIN & MINERAL PO), Take by mouth., Disp: , Rfl:  .  niacin (NIASPAN) 1000 MG CR tablet, Take by mouth., Disp: , Rfl:  .  nitroGLYCERIN (NITROSTAT) 0.4 MG SL tablet, Place under the tongue., Disp: , Rfl:  .  prasugrel (EFFIENT) 10 MG TABS tablet, TAKE 1 TABLET BY MOUTH EVERY DAY, Disp: , Rfl:  .  rosuvastatin (CRESTOR) 20 MG tablet, Take by mouth., Disp: , Rfl:  .  tiotropium (SPIRIVA) 18 MCG inhalation capsule, Place into inhaler and inhale., Disp: , Rfl:   Past Medical History: No past medical history on file.  Tobacco Use: History  Smoking status  . Former Smoker -- 2.00 packs/day for 22 years  . Types: Cigarettes  Smokeless tobacco  . Former Systems developer   . Quit date: 09/03/1980    Labs: Recent Review Flowsheet Data    There is no flowsheet data to display.         POCT Glucose      11/03/14 1524 11/06/14 1449 11/08/14 1130       POCT Blood Glucose   Pre-Exercise 100 mg/dL 100 mg/dL      Post-Exercise 85 mg/dL 85 mg/dL      Pre-Exercise #2  102 mg/dL      Post-Exercise #2  95 mg/dL  Peanut butter crackers given for after class on the way to lunch      Pre-Exercise #3   96 mg/dL     Post-Exercise #3   99 mg/dL        ADL UCSD:     ADL UCSD      10/31/14 1131 01/03/15 1130 02/16/15 1130   ADL UCSD   ADL Phase Entry Mid Entry   SOB Score total 75 69 85   Rest _0 Walk _1 Stairs _2 Bath _3 Dress _4 Shop _5 02/16/15 1631       ADL UCSD   ADL Phase Exit     SOB Score total 85     Rest 1  Walk 2     Stairs 4     Bath 3     Dress 3     Shop 5         Pulmonary Function Assessment:     Pulmonary Function Assessment - 10/31/14 1129    Pulmonary Function Tests   FVC% 56 %   FEV1% 61 %   FEV1/FVC Ratio 83.86   RV% 64 %   DLCO% 45 %   Breath   Bilateral Breath Sounds Clear   Shortness of Breath No;Yes      Exercise Target Goals:    Exercise Program Goal: Individual exercise prescription set with THRR, safety & activity barriers. Participant demonstrates ability to understand and report RPE using BORG scale, to self-measure pulse accurately, and to acknowledge the importance of the exercise prescription.  Exercise Prescription Goal: Starting with aerobic activity 30 plus minutes a day, 3 days per week for initial exercise prescription. Provide home exercise prescription and guidelines that participant acknowledges understanding prior to discharge.  Activity Barriers & Risk Stratification:     Activity Barriers & Risk Stratification - 10/31/14 1128    Activity Barriers & Risk Stratification   Activity Barriers None      6 Minute Walk:     6 Minute Walk       10/31/14 1702 01/03/15 1225 02/09/15 1159   6 Minute Walk   Phase Initial Mid Program Discharge   Distance 940 feet 880 feet 600 feet   Walk Time 6 minutes 6 minutes 5 minutes   Resting HR 73 bpm 105 bpm 102 bpm   Resting BP 128/76 mmHg 124/70 mmHg 158/88 mmHg   Max Ex. HR 131 bpm 136 bpm 109 bpm   Max Ex. BP 162/84 mmHg 182/82 mmHg 172/94 mmHg   RPE _0 Perceived Dyspnea  _1 Symptoms No No      02/09/15 1214       6 Minute Walk   Resting HR (p) --  Pulse ox on 6 liters before 6 minute walk 98%.     Perceived Dyspnea  (p) --  Post 6 min pulse ox 88% on 6 liters oxgyen        Initial Exercise Prescription:     Initial Exercise Prescription - 10/31/14 1700    Date of Initial Exercise Prescription   Date 10/31/14   Treadmill   MPH 1.4   Grade 0   Minutes 10   Bike   Level 1   Watts 30   Minutes 10   Recumbant Bike   Level 1   RPM 30   Watts 20   Minutes 10   NuStep   Level 2   Watts 50   Minutes 10   Arm Ergometer   Level 1   Watts 10   Minutes 10   Arm/Foot Ergometer   Level 1   Watts 12   Minutes 10   Recumbant Elliptical   Level 1   RPM 40   Watts 20   Minutes 10   Elliptical   Level 1   Speed 3   Minutes 1   REL-XR   Level 2   Watts 50   Minutes 10   Prescription Details   Frequency (times per week) 3   Duration Progress to 30 minutes of continuous aerobic without signs/symptoms of physical distress   Intensity   THRR REST +  30   Ratings of Perceived Exertion 11-15  Perceived Dyspnea 2-4   Resistance Training   Training Prescription Yes   Weight 1   Reps 10-12      Exercise Prescription Changes:     Exercise Prescription Changes      11/03/14 1500 11/06/14 1400 11/10/14 1300 11/13/14 1000 11/13/14 1400   Exercise Review   Progression    Yes Yes   Response to Exercise   Blood Pressure (Admit)    130/80 mmHg 130/80 mmHg   Blood Pressure (Exercise)    160/90 mmHg 160/90 mmHg   Blood Pressure (Exit)    142/84 mmHg  142/84 mmHg   Heart Rate (Admit)     82 bpm   Heart Rate (Exercise)     118 bpm   Heart Rate (Exit)     84 bpm   Oxygen Saturation (Admit)     92 %   Oxygen Saturation (Exercise)     88 %   Oxygen Saturation (Exit)     96 %   Rating of Perceived Exertion (Exercise)     13   Perceived Dyspnea (Exercise)     13   Intensity    THRR unchanged THRR unchanged   Progression    Continue progressive overload as per policy without signs/symptoms or physical distress. Continue progressive overload as per policy without signs/symptoms or physical distress.   Resistance Training   Training Prescription   Yes  2 lbs. Yes Yes   Weight   _0 Reps    10-12 10-12   Treadmill   MPH  1.4 1.5 1.4 1.4   Grade  0  0 0   Minutes  _1 NuStep   Level _2 Watts 50   30 30   Minutes _3 Recumbant Elliptical   Level  1  1 1.5   RPM  _4 Watts     12   Minutes  _5 12/06/14 1200 12/06/14 1500 12/08/14 1400 12/11/14 1200 01/01/15 1200   Exercise Review   Progression Yes Yes  Yes Yes   Response to Exercise   Blood Pressure (Admit) 130/80 mmHg 148/80 mmHg   118/78 mmHg   Blood Pressure (Exercise) 160/90 mmHg 142/74 mmHg   140/68 mmHg   Blood Pressure (Exit) 142/84 mmHg 140/80 mmHg   122/62 mmHg   Heart Rate (Admit) 82 bpm 95 bpm   80 bpm   Heart Rate (Exercise) 118 bpm 118 bpm   108 bpm   Heart Rate (Exit) 84 bpm 95 bpm   85 bpm   Oxygen Saturation (Admit) 92 % 94 %   91 %  6l/m continuous   Oxygen Saturation (Exercise) 88 % 90 %  6l/m continuous   86 %   Oxygen Saturation (Exit) 96 % 97 %   97 %   Rating of Perceived Exertion (Exercise) _6 Perceived Dyspnea (Exercise) _7 Intensity THRR unchanged       Progression Continue progressive overload as per policy without signs/symptoms or physical distress.       Resistance Training   Training Prescription Yes    Yes   Weight 2    4   Reps 10-12    10-12   Treadmill   MPH 1.4  1.5 1.5 1.5    Grade 0  0 0 0   Minutes _0 NuStep   Level _1 Watts 30   45 50   Minutes 15    15   Recumbant Elliptical   Level _2 RPM 20   60 40   Watts 15       Minutes 10    12     01/15/15 1200 01/17/15 1500 01/22/15 1300 01/31/15 1200 01/31/15 1400   Exercise Review   Progression _3    Response to Exercise   Blood Pressure (Admit) 118/78 mmHg  118/78 mmHg 140/88 mmHg    Blood Pressure (Exercise) 140/68 mmHg  140/68 mmHg 156/80 mmHg    Blood Pressure (Exit) 122/62 mmHg  122/62 mmHg 118/80 mmHg    Heart Rate (Admit) 80 bpm  80 bpm 88 bpm    Heart Rate (Exercise) 108 bpm  108 bpm 122 bpm    Heart Rate (Exit) 85 bpm  85 bpm 98 bpm    Oxygen Saturation (Admit) 91 %  6l/m continuous  91 %  6l/m continuous 93 %    Oxygen Saturation (Exercise) 86 %  86 % 90 %    Oxygen Saturation (Exit) 97 %  97 % 94 %    Rating of Perceived Exertion (Exercise) _4 Perceived Dyspnea (Exercise) _5 Intensity  THRR unchanged      Progression  Continue progressive overload as per policy without signs/symptoms or physical distress.      Resistance Training   Training Prescription Yes Yes Yes Yes    Weight _6 Reps 10-12 10-12 10-12 10-12    Treadmill   MPH 1.5 1.5 1.5 1.8    Grade 0 0 0 0    Minutes _7 NuStep   Level _8 T5    Watts 50  50 80    Minutes _9 Recumbant Elliptical   Level _10 RPM 50  50 50    Minutes    15      02/12/15 1200 02/23/15 1500         Exercise Review   Progression Yes       Response to Exercise   Blood Pressure (Admit)  134/83 mmHg      Blood Pressure (Exercise)  140/80 mmHg      Blood Pressure (Exit)  138/70 mmHg      Heart Rate (Admit)  101 bpm      Heart Rate (Exercise)  118 bpm      Heart Rate (Exit)  97 bpm      Oxygen Saturation (Admit)  91 %      Oxygen Saturation (Exercise)  89 %      Oxygen Saturation (Exit)  99 %      Rating of Perceived Exertion (Exercise)  13       Perceived Dyspnea (Exercise)  4      Resistance Training   Training Prescription  Yes      Weight  5      Reps  10-12      Treadmill   MPH  1.8      Grade  0      Minutes  15  NuStep   Level 7  T5 Nustep 7  T5 Nustep      Watts 60 60      Minutes  15      Recumbant Elliptical   Level  6      RPM  50      Minutes  15      Home Exercise Plan   Plans to continue exercise at  --  Mr. Goeller plans to attend another 36 sessions per MD order.         Discharge Exercise Prescription (Final Exercise Prescription Changes):     Exercise Prescription Changes - 02/23/15 1500    Response to Exercise   Blood Pressure (Admit) 134/83 mmHg   Blood Pressure (Exercise) 140/80 mmHg   Blood Pressure (Exit) 138/70 mmHg   Heart Rate (Admit) 101 bpm   Heart Rate (Exercise) 118 bpm   Heart Rate (Exit) 97 bpm   Oxygen Saturation (Admit) 91 %   Oxygen Saturation (Exercise) 89 %   Oxygen Saturation (Exit) 99 %   Rating of Perceived Exertion (Exercise) 13   Perceived Dyspnea (Exercise) 4   Resistance Training   Training Prescription Yes   Weight 5   Reps 10-12   Treadmill   MPH 1.8   Grade 0   Minutes 15   NuStep   Level 7  T5 Nustep   Watts 60   Minutes 15   Recumbant Elliptical   Level 6   RPM 50   Minutes 15   Home Exercise Plan   Plans to continue exercise at --  Mr. Seiden plans to attend another 36 sessions per MD order.       Nutrition:  Target Goals: Understanding of nutrition guidelines, daily intake of sodium <1544m, cholesterol <2015m calories 30% from fat and 7% or less from saturated fats, daily to have 5 or more servings of fruits and vegetables.  Biometrics:     Pre Biometrics - 10/31/14 1705    Pre Biometrics   Height _0  (1.88 m)   Weight (!) 342 lb 9.6 oz (155.402 kg)   Waist Circumference 55 inches   Hip Circumference 56 inches   Waist to Hip Ratio 0.98 %   BMI (Calculated) 44.1       Nutrition Therapy Plan and Nutrition Goals:      Nutrition Therapy & Goals - 12/21/14 1200    Nutrition Therapy   Diet Instructed patient on a 2000 calorie vegan meal plan, DASH diet principles.  He states that he is following Dr. EsMaurine MinisterReverse Heart Disease" diet which is vegan and eliminates all oils but admits he is having difficulty following it. Discussed the benefits of a vegan diet but stresssed that have to be careful to include adequate protein.   Fiber 20 grams   Whole Grain Foods 4 servings   Protein 8 ounces/day   Saturated Fats 13 max. grams   Fruits and Vegetables 5 servings/day   Personal Nutrition Goals   Personal Goal #1 To space meals 4-6 hours apart.   Personal Goal #2 Include a protein source at every meal. Refer to list given on meat alternatives. Goal of 75 gms protein/day.   Personal Goal #3 Use my Fitness Pal to record food/beverage intake   Personal Goal #4 In addtions to strategies discussed to help with weight loss, refer to "National Weight Loss Registry" for numerous other healthy strategies that participants have used to help lose and maintain weight loss.  Nutrition Discharge: Rate Your Plate Scores:     Rate Your Plate - 30/16/01 0932    Rate Your Plate Scores   Pre Score 55   Pre Score % 79 %  Due to vegan diet, many of the questions were  N/A      Psychosocial: Target Goals: Acknowledge presence or absence of depression, maximize coping skills, provide positive support system. Participant is able to verbalize types and ability to use techniques and skills needed for reducing stress and depression.  Initial Review & Psychosocial Screening:     Initial Psych Review & Screening - 10/31/14 0900    Initial Review   Current issues with Current Depression;Current Sleep Concerns;Current Stress Concerns   Source of Stress Concerns Chronic Illness;Unable to participate in former interests or hobbies   [place]? Yes   Barriers   Psychosocial barriers to  participate in program There are no identifiable barriers or psychosocial needs.;The patient should benefit from training in stress management and relaxation.   Screening Interventions   Interventions Encouraged to exercise;Program counselor consult      Quality of Life Scores:     Quality of Life - 02/19/15 1130    Quality of Life Scores   Health/Function Post 9.63 %   Socioeconomic Post 14.75 %   Psych/Spiritual Post 12.64 %   Family Post 18.6 %   GLOBAL Post 12.69 %      PHQ-9:     Recent Review Flowsheet Data    Depression screen [facility] 2/9 02/19/2015 02/12/2015 10/31/2014   Decreased Interest _0 Down, Depressed, Hopeless _1 PHQ - 2 Score _2 Altered sleeping 0 0 0   Tired, decreased energy _3 Change in appetite _4 Feeling bad or failure about yourself  _5 Trouble concentrating _6 Moving slowly or fidgety/restless _7 Suicidal thoughts _8 PHQ-9 Score _9 Difficult doing work/chores Somewhat difficult - Somewhat difficult      Psychosocial Evaluation and Intervention:     Psychosocial Evaluation - 11/08/14 1627    Psychosocial Evaluation & Interventions   Interventions Stress management education;Encouraged to exercise with the program and follow exercise prescription;Relaxation education   Comments Counselor met with Mr. Leser today for initial psychosocial evaluation.  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  He lives with a spouse of 73 years and several friends whom he refers to as part of his support system.  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 Jenetta Downer reporting he doesn't use his CPAP to sleep as it is not user friendly.  Counselor reviewed the PHQ-9 with a score of "17" with Mr. Jenetta Downer confirming all his answers are current and accurate.  When assessed for depression, he denies a history  and states any current symptoms are "situational" due to his health issues primarily.  Mr. Jenetta Downer also reports current stressors are health, financial, conflict with his daughter, a close cousin with pancreatic cancer and his inability to do the things that used to bring him pleasure, such as playing the guitar/banjo with friends and playing his harmonica.  Mr. Colman Cater goals for this program are to breathe better, lose some weight, increase  his physical strength and be stronger and more able to walk/go shopping without being "tied to this big oxygen tank."  Counselor discussed Mr. Jenetta Downer mentioning to his Dr. for a medication evaluation for his depressive symptoms and possibly seeing a therpist for support currently.  He preferred to wait to see if this program helps him feel better/stronger first.  Counselor will re-assess over the next few weeks and make further recommendations at that time.      Continued Psychosocial Services Needed Yes  Mr. O will benefit from all the educational components of this program, and participated in Relaxation education on this date.  He will benefit from meeting with a dietician, and attending the depression and stress management educational components.      Psychosocial Re-Evaluation:  Education: Education Goals: Education classes will be provided on a weekly basis, covering required topics. Participant will state understanding/return demonstration of topics presented.  Learning Barriers/Preferences:     Learning Barriers/Preferences - 10/31/14 1128    Learning Barriers/Preferences   Learning Barriers None   Learning Preferences Group Instruction;Individual Instruction;Pictoral;Written Material;Video;Verbal Instruction;Skilled Demonstration      Education Topics: Initial Evaluation Education: - Verbal, written and demonstration of respiratory meds, RPE/PD scales, oximetry and breathing techniques. Instruction on use of nebulizers and MDIs: cleaning and proper use, rinsing  mouth with steroid doses and importance of monitoring MDI activations.          Pulmonary Rehab from 02/23/2015 in [place]   Date  10/31/14   Educator  LB   Instruction Review Code  2- meets goals/outcomes      General Nutrition Guidelines/Fats and Fiber: -Group instruction provided by verbal, written material, models and posters to present the general guidelines for heart healthy nutrition. Gives an explanation and review of dietary fats and fiber.      Pulmonary Rehab from 02/23/2015 in [place]   Date  02/05/15   Educator  C. Joneen Caraway, RD   Instruction Review Code  2- meets goals/outcomes      Controlling Sodium/Reading Food Labels: -Group verbal and written material supporting the discussion of sodium use in heart healthy nutrition. Review and explanation with models, verbal and written materials for utilization of the food label.      Pulmonary Rehab from 02/23/2015 in [place]   Date  02/12/15   Educator  C. Rusell, RD   Instruction Review Code  2- meets goals/outcomes      Exercise Physiology & Risk Factors: - Group verbal and written instruction with models to review the exercise physiology of the cardiovascular system and associated critical values. Details cardiovascular disease risk factors and the goals associated with each risk factor.   Aerobic Exercise & Resistance Training: - Gives group verbal and written discussion on the health impact of inactivity. On the components of aerobic and resistive training programs and the benefits of this training and how to safely progress through these programs.      Pulmonary Rehab from 02/23/2015 in [place]   Date  12/27/14   Educator  SW   Instruction Review Code  2- meets goals/outcomes      Flexibility, Balance, General Exercise Guidelines: - Provides group verbal and  written instruction on the benefits of flexibility and balance training programs. Provides general exercise guidelines with specific guidelines to those with heart or lung disease. Demonstration and skill practice provided.      Pulmonary  Rehab from 02/23/2015 in [place]   Date  01/24/15   Educator  SW   Instruction Review Code  2- meets goals/outcomes      Stress Management: - Provides group verbal and written instruction about the health risks of elevated stress, cause of high stress, and healthy ways to reduce stress.      Pulmonary Rehab from 02/23/2015 in [place]   Date  01/17/15   Educator  [facility]   Instruction Review Code  2- meets goals/outcomes      Depression: - Provides group verbal and written instruction on the correlation between heart/lung disease and depressed mood, treatment options, and the stigmas associated with seeking treatment.      Pulmonary Rehab from 02/23/2015 in [place]   Date  12/06/14   Educator  Berle Mull, MSW   Instruction Review Code  2- meets goals/outcomes      Exercise & Equipment Safety: - Individual verbal instruction and demonstration of equipment use and safety with use of the equipment.      Pulmonary Rehab from 02/23/2015 in [place]   Date  01/15/15   Educator  L. Owens Shark, RRT [facility] [place]]   Instruction Review Code  2- meets goals/outcomes      Infection Prevention: - Provides verbal and written material to individual with discussion of infection control including proper hand washing and proper equipment cleaning during exercise session.      Pulmonary Rehab from 02/23/2015 in [place]   Date  11/03/14   Educator  Jacqulyn Ducking   Instruction Review Code  2- meets goals/outcomes      Falls Prevention: - Provides verbal and written material to  individual with discussion of falls prevention and safety.   Diabetes: - Individual verbal and written instruction to review signs/symptoms of diabetes, desired ranges of glucose level fasting, after meals and with exercise. Advice that pre and post exercise glucose checks will be done for 3 sessions at entry of program.      Pulmonary Rehab from 02/23/2015 in [place]   Date  02/23/15 [facility] your numbers]   Educator  C. Shyquan Stallbaumer   Instruction Review Code  2- meets goals/outcomes      Chronic Lung Diseases: - Group verbal and written instruction to review new updates, new respiratory medications, new advancements in procedures and treatments. Provide informative websites and "800" numbers of self-education.      Pulmonary Rehab from 02/23/2015 in [place]   Date  01/29/15   Educator  C. [place]   Instruction Review Code  2- meets goals/outcomes      Lung Procedures: - Group verbal and written instruction to describe testing methods done to diagnose lung disease. Review the outcome of test results. Describe the treatment choices: Pulmonary Function Tests, ABGs and oximetry.      Pulmonary Rehab from 02/23/2015 in [place]   Date  01/26/15   Educator  [place]   Instruction Review Code  2- meets goals/outcomes      Energy Conservation: - Provide group verbal and written instruction for methods to conserve energy, plan and organize activities. Instruct on pacing techniques, use of adaptive equipment and posture/positioning to relieve shortness of breath.      Pulmonary Rehab from 02/23/2015 in [place]   Date  02/07/15   Educator  SW   Instruction Review Code  2- meets goals/outcomes      Triggers: - Group verbal and written instruction to review types of environmental controls: home humidity, furnaces, filters, dust mite/pet  prevention, HEPA vacuums. To discuss weather changes, air quality and the benefits of nasal washing.      Pulmonary Rehab from 02/23/2015 in [place]   Date  01/01/15   Educator  LB   Instruction Review Code  2- meets goals/outcomes      Exacerbations: - Group verbal and written instruction to provide: warning signs, infection symptoms, calling MD promptly, preventive modes, and value of vaccinations. Review: effective airway clearance, coughing and/or vibration techniques. Create an Sports administrator.   Oxygen: - Individual and group verbal and written instruction on oxygen therapy. Includes supplement oxygen, available portable oxygen systems, continuous and intermittent flow rates, oxygen safety, concentrators, and Medicare reimbursement for oxygen.      Pulmonary Rehab from 02/23/2015 in [place]   Date  10/31/14   Educator  LB   Instruction Review Code  2- meets goals/outcomes      Respiratory Medications: - Group verbal and written instruction to review medications for lung disease. Drug class, frequency, complications, importance of spacers, rinsing mouth after steroid MDI's, and proper cleaning methods for nebulizers.      Pulmonary Rehab from 02/23/2015 in [place]   Date  10/31/14   Educator  LB   Instruction Review Code  2- meets goals/outcomes      AED/CPR: - Group verbal and written instruction with the use of models to demonstrate the basic use of the AED with the basic ABC's of resuscitation.      Pulmonary Rehab from 02/23/2015 in [place]   Date  02/02/15   Educator  CE   Instruction Review Code  2- meets goals/outcomes      Breathing Retraining: - Provides individuals verbal and written instruction on purpose, frequency, and proper technique of diaphragmatic breathing and pursed-lipped breathing. Applies individual  practice skills.      Pulmonary Rehab from 02/23/2015 in [place]   Date  10/31/14   Educator  LB   Instruction Review Code  R- Review/reinforce      Anatomy and Physiology of the Lungs: - Group verbal and written instruction with the use of models to provide basic lung anatomy and physiology related to function, structure and complications of lung disease.   Heart Failure: - Group verbal and written instruction on the basics of heart failure: signs/symptoms, treatments, explanation of ejection fraction, enlarged heart and cardiomyopathy.      Pulmonary Rehab from 02/23/2015 in [place]   Date  01/12/15   Educator  Frederich Cha, RRT   Instruction Review Code  2- meets goals/outcomes      Sleep Apnea: - Individual verbal and written instruction to review Obstructive Sleep Apnea. Review of risk factors, methods for diagnosing and types of masks and machines for OSA.   Anxiety: - Provides group, verbal and written instruction on the correlation between heart/lung disease and anxiety, treatment options, and management of anxiety.      Pulmonary Rehab from 02/23/2015 in [place]   Date  02/14/15   Educator  Berle Mull, MSW   Instruction Review Code  2- Meets goals/outcomes  Relaxation: - Provides group, verbal and written instruction about the benefits of relaxation for patients with heart/lung disease. Also provides patients with examples of relaxation techniques.      Pulmonary Rehab from 02/23/2015 in [place]   Date  11/08/14   Educator  Lucianne Lei LCSW   Instruction Review Code  2- Meets goals/outcomes      Knowledge Questionnaire Score:     Knowledge Questionnaire Score - 02/20/15 1018    Knowledge Questionnaire Score   Post Score -1      Personal Goals and Risk Factors at Admission:     Personal  Goals and Risk Factors at Admission - 03/02/15 1130    Personal Goals and Risk Factors on Admission    Weight Management Yes      Personal Goals and Risk Factors Review:      Goals and Risk Factor Review      11/15/14 1130 11/29/14 1130 12/06/14 1130 12/20/14 1248 12/20/14 1548   Weight Management   Goals Progress/Improvement seen Yes       Comments Mr Fosco has filled out the dietitian's questionnaire; it was faxed to her; and he will meet with her after the holiday.       Increase Aerobic Exercise and Physical Activity   Goals Progress/Improvement seen    Yes     Comments   Mr Arakawa has had increases with his exercise goals on the TM, T5, and TM.     Improve shortness of breath with ADL's   Goals Progress/Improvement seen    Yes     Comments   In working with Mr Klugh on his oxygen and O2Sat's, he has been able to increase his exercise goals which help improve his shortness of breath, especially with the correct oxygen flow rates.     Breathing Techniques   Goals Progress/Improvement seen    Yes  Yes   Comments   Mr Kirsh works very hard to incorporate PLB with his exercise.  Encouraging Mr Hofman to use PLB with his exercise in [place]. With his low O2Sat's on the TM today, he improved his readings with the PLB. He states he really has to make himself breath deep. Encouraged him to use PLB  when walking into LW and at his apartment in the long hallway to elevator.   Increase knowledge of respiratory medications   Goals Progress/Improvement seen   Yes      Comments  Worked with Mr Dolph and portable oxygen; I had him use the Eclipse which is a Systems developer and used in our gym; He was not able to maintain O2Sat's on the 6l/m intermittent and we switched him to the 6l/m gas portable with continuous flow.  I also gave him a copy of a Pulmonary PPaper which had a great article on portable concentrators.      Other Goal   Goals Progress/Improvement seen     Yes    Comments    Discussed with Ridhaan about  a piece of exercise equipment he has avaiable for home use and the benefits of it.      01/01/15 1234 01/08/15 1547 01/15/15 1130 01/19/15 1130 01/24/15 1130   Weight Management   Goals Progress/Improvement seen Yes  No     Comments ongoing, would like to lose 150 lbs.  Asked  Mr Mollica if he had an appointment with the dietitian yet; he said he did meet with her but continues to struggle with his diet -  large portion sizes and snacking late at night; his weight is averaging  340lbs.  He has been on a [employer]  since 2006 and has helped his cardiac health.     Increase Aerobic Exercise and Physical Activity   Goals Progress/Improvement seen  Yes   Yes    Comments continue to make gains on the treadmill   Mr Nuno states his endurance is increasing. 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 He still needs to find a oxygen portable concentrator that will work for this that would cover the time involved.    Improve shortness of breath with ADL's   Goals Progress/Improvement seen  Yes Yes   Yes   Comments  Mr Ryans is managing his shortness of breath in [facility] by managing  his oxygen and adjusting the flow rate to 6l/m with exercise and with some activites at home activities   Mr Flannery brought his harmonica in to class and played it for the class. We talked about the benefits of playing the harmonica on inspiratory and expiratory muscle strength and PLB technique.   Breathing Techniques   Goals Progress/Improvement seen  Yes       Comments wants to improve, also incorporate meditation       Increase knowledge of respiratory medications   Goals Progress/Improvement seen   Yes      Comments  Mr Fera has a good understanding of his Spiriva; he states that he is not sure "it works", but explained the importance of Spiriva, especially with activity.      Stress   Goal To meet with psychosocial counselor for stress and relaxation information and  guidance. To state understanding of performing relaxation techniques and or identifying personal stressors.         01/24/15 1330 01/29/15 1550         Understand more about Heart/Pulmonary Disease   Goals Progress/Improvement seen  Yes       Comments Has enjoyed all the education classes, has trouble retaining information or recalling information, but he still enjoys the sessions and feels like they are helping him better manage his disease.        Improve shortness of breath with ADL's   Goals Progress/Improvement seen  Yes       Comments Has not seen much improvement in SOB since last month, but is still happy with his progress in the program.        Breathing Techniques   Goals Progress/Improvement seen  Yes       Comments Uses PLB when his O2 saturation is low or when he feels short of breath. He especially utilizes this during class while exercising, but also uses it around the house and when he is out.        Increase knowledge of respiratory medications   Goals Progress/Improvement seen   Yes      Comments  Discussed with Mr Casebolt about use an oral device with his sleep apnea, since he can not tolerate the CPAP masks, he was open to it and gave him a list of dentists in the area who are certiified in the oral devices.         Personal Goals Discharge:      Personal Goals at Discharge - 02/12/15 1130    Weight Management   Goals Progress/Improvement seen No   Comments Mr Hanshaw has maintained his weight at [place]. He has  meet with the dietitian and continues with his Vegar Diet.   Increase Aerobic Exercise and Physical Activity   Goals Progress/Improvement seen  Yes   Comments Mr Hirota has increased his exercise goals on the REL to L6 and the T5 to L7; plus he has increased the TM to 1.8 with acceptable O2Sats. His endurance and stamina has improved at home as well.   Understand more about Heart/Pulmonary Disease   Goals Progress/Improvement seen  Yes   Comments Mr Ruderman has  enjoyed the education classes and feels they have helped better manage his disease.   Improve shortness of breath with ADL's   Goals Progress/Improvement seen  Yes   Comments Mr Go continues to experience SOB, but is happy with his progress in [facility]. He is using 6l/m to 8l/m for his exercise which has helped his SOB and maintains acceptable O2Sats.   Breathing Techniques   Goals Progress/Improvement seen  Yes   Comments Mr Tanzi uses PLB at home and with exercise - noticeable improvement in his O2Sats with this technique.   Increase knowledge of respiratory medications   Comments Mr Rampey has a good understanding of his Spiriva. He continues to look for a portable concentrator that will meet his need of 4-6 l/m for activity. He has also learned to adjust his flow rate for increased exercise.      Comments: 30 day note day

## 2015-03-07 ENCOUNTER — Encounter: Payer: Medicare Other | Admitting: *Deleted

## 2015-03-07 DIAGNOSIS — J42 Unspecified chronic bronchitis: Secondary | ICD-10-CM | POA: Diagnosis not present

## 2015-03-07 DIAGNOSIS — J449 Chronic obstructive pulmonary disease, unspecified: Secondary | ICD-10-CM

## 2015-03-07 NOTE — Progress Notes (Signed)
Daily Session Note  Patient Details  Name: Miguel Palmer MRN: [id number] Date of Birth: 30-Mar-1941 Referring Provider:  [name], MD  Encounter Date: 03/07/2015  Check In:     Session Check In - 03/07/15 1225    Check-In   Staff Present Carson Myrtle BS, RRT, Respiratory Therapist;Lorrene Graef Dillard Essex MS, ACSM CEP Exercise Physiologist;Steven Way BS, ACSM EP-C, Exercise Physiologist   ER physicians immediately available to respond to emergencies [facility] immediately available ER MD   Physician(s) Edd Fabian and Lord   Medication changes reported     No   Fall or balance concerns reported    No   Warm-up and Cool-down Performed on first and last piece of equipment   VAD Patient? No   Pain Assessment   Currently in Pain? No/denies   Multiple Pain Sites No         Goals Met:  Proper associated with RPD/PD & O2 Sat Independence with exercise equipment Using PLB without cueing & demonstrates good technique Exercise tolerated well Strength training completed today  Goals Unmet:  Not Applicable  Goals Comments: [name]'s O2 saturation was at the low end of what is acceptable while on the treadmill. Most days he has to stop or decrease in speed on the treadmill due to low O2 saturation and he did not have to do that today which was encouraging to him.        [name] is having a CT of his lungs scheduled in a few weeks. He joined the [employer] today. [name] RRT   Dr. Emily Filbert is Medical Director for [place] and [facility].

## 2015-03-09 ENCOUNTER — Encounter: Payer: Medicare Other | Admitting: *Deleted

## 2015-03-09 DIAGNOSIS — J449 Chronic obstructive pulmonary disease, unspecified: Secondary | ICD-10-CM

## 2015-03-09 DIAGNOSIS — J42 Unspecified chronic bronchitis: Secondary | ICD-10-CM | POA: Diagnosis not present

## 2015-03-09 NOTE — Progress Notes (Signed)
Daily Session Note  Patient Details  Name: Miguel Palmer MRN: [id number] Date of Birth: [birth year] Referring Provider:  [name], MD  Encounter Date: 03/09/2015  Check In:     Session Check In - 03/09/15 1308    Check-In   Staff Present Frederich Cha RRT, RCP Respiratory Therapist;Carroll Enterkin RN, Drusilla Kanner MS, ACSM CEP Exercise Physiologist   ER physicians immediately available to respond to emergencies [facility] immediately available ER MD   Physician(s) Jimmye Norman and Burlene Arnt   Medication changes reported     No   Fall or balance concerns reported    No   Warm-up and Cool-down Performed on first and last piece of equipment   VAD Patient? No   Pain Assessment   Currently in Pain? No/denies   Multiple Pain Sites No         Goals Met:  Proper associated with RPD/PD & O2 Sat Independence with exercise equipment Using PLB without cueing & demonstrates good technique Exercise tolerated well Strength training completed today  Goals Unmet:  Not Applicable  Goals Comments: Miguel Palmer had good O2 readings today and did well on the exercise equipment. He prefers to report his workload on the REL in terms of RPMs instead of Watts and we noted this on his chart.    Dr. Emily Filbert is Medical Director for [place] and [facility].

## 2015-03-12 ENCOUNTER — Ambulatory Visit
Admission: RE | Admit: 2015-03-12 | Discharge: 2015-03-12 | Disposition: A | Discharge status: Home / Self Care | Payer: Medicare Other | Source: Ambulatory Visit | Attending: Family Medicine | Admitting: Family Medicine

## 2015-03-12 DIAGNOSIS — R0902 Hypoxemia: Secondary | ICD-10-CM | POA: Diagnosis present

## 2015-03-12 DIAGNOSIS — I2510 Atherosclerotic heart disease of native coronary artery without angina pectoris: Secondary | ICD-10-CM | POA: Insufficient documentation

## 2015-03-12 DIAGNOSIS — R042 Hemoptysis: Secondary | ICD-10-CM | POA: Diagnosis not present

## 2015-03-12 DIAGNOSIS — J449 Chronic obstructive pulmonary disease, unspecified: Secondary | ICD-10-CM | POA: Insufficient documentation

## 2015-03-14 ENCOUNTER — Encounter: Payer: Medicare Other | Admitting: *Deleted

## 2015-03-14 DIAGNOSIS — J449 Chronic obstructive pulmonary disease, unspecified: Secondary | ICD-10-CM

## 2015-03-14 DIAGNOSIS — J42 Unspecified chronic bronchitis: Secondary | ICD-10-CM | POA: Diagnosis not present

## 2015-03-14 NOTE — Progress Notes (Signed)
Daily Session Note  Patient Details  Name: Miguel Palmer MRN: [id number] Date of Birth: Sep 22, 1940 Referring Provider:  [name], MD  Encounter Date: 03/14/2015  Check In:     Session Check In - 03/14/15 1205    Check-In   Staff Present Carson Myrtle BS, RRT, Respiratory Therapist;Lether Tesch Dillard Essex MS, ACSM CEP Exercise Physiologist;Steven Way BS, ACSM EP-C, Exercise Physiologist   ER physicians immediately available to respond to emergencies [facility] immediately available ER MD   Physician(s) Jimmye Norman and Archie Balboa   Medication changes reported     No   Fall or balance concerns reported    No   Warm-up and Cool-down Performed on first and last piece of equipment   VAD Patient? No   Pain Assessment   Currently in Pain? No/denies   Multiple Pain Sites No         Goals Met:  Proper associated with RPD/PD & O2 Sat Independence with exercise equipment Using PLB without cueing & demonstrates good technique Exercise tolerated well Strength training completed today  Goals Unmet:  Not Applicable  Goals Comments: [name] did well with all of his exercise goals today and is making progress.    Dr. Emily Filbert is Medical Director for [place] and [facility].

## 2015-03-19 DIAGNOSIS — G4733 Obstructive sleep apnea (adult) (pediatric): Secondary | ICD-10-CM

## 2015-03-19 DIAGNOSIS — J42 Unspecified chronic bronchitis: Secondary | ICD-10-CM | POA: Diagnosis not present

## 2015-03-19 NOTE — Progress Notes (Signed)
Daily Session Note  Patient Details  Name: Miguel Palmer MRN: [id number] Date of Birth: Mar 06, 1941 Referring Provider:  [name], MD  Encounter Date: 03/19/2015  Check In:     Session Check In - 03/19/15 1252    Check-In   Staff Present Lestine Box BS, ACSM EP-C, Exercise Physiologist;Carroll Enterkin RN, BSN;Laureen Janell Quiet, RRT, Respiratory Therapist   ER physicians immediately available to respond to emergencies [facility] immediately available ER MD   Physician(s) quale and williams   Medication changes reported     No   Fall or balance concerns reported    No   Warm-up and Cool-down Performed on first and last piece of equipment   VAD Patient? No   Pain Assessment   Currently in Pain? No/denies         Goals Met:  Proper associated with RPD/PD & O2 Sat Exercise tolerated well No report of cardiac concerns or symptoms Strength training completed today  Goals Unmet:  Not Applicable Goals Comments: Miguel Palmer brought in his last PFT and CT Results - plan to review with him on Wednesday.  Miguel Palmer is Medical Director for [place] and [facility].

## 2015-03-20 ENCOUNTER — Encounter: Payer: Self-pay | Admitting: Respiratory Therapy

## 2015-03-21 ENCOUNTER — Encounter: Payer: Medicare Other | Admitting: *Deleted

## 2015-03-21 DIAGNOSIS — G4733 Obstructive sleep apnea (adult) (pediatric): Secondary | ICD-10-CM

## 2015-03-21 DIAGNOSIS — J42 Unspecified chronic bronchitis: Secondary | ICD-10-CM | POA: Diagnosis not present

## 2015-03-21 NOTE — Progress Notes (Signed)
Daily Session Note  Patient Details  Name: Miguel Palmer MRN: [id number] Date of Birth: 1940/12/01 Referring Provider:  [name], MD  Encounter Date: 03/21/2015  Check In:     Session Check In - 03/21/15 1241    Check-In   Staff Present Carson Myrtle BS, RRT, Respiratory Therapist;Renee Dillard Essex MS, ACSM CEP Exercise Physiologist;Carroll Enterkin RN, BSN   ER physicians immediately available to respond to emergencies [facility] immediately available ER MD   Physician(s) Dr. Lucita Lora and Dr. Kerman Passey   Medication changes reported     No   Fall or balance concerns reported    No   Warm-up and Cool-down Performed on first and last piece of equipment   VAD Patient? No   Pain Assessment   Currently in Pain? No/denies         Goals Met:  Proper associated with RPD/PD & O2 Sat Exercise tolerated well  Goals Unmet:  Not Applicable  Goals Comments: [name] encouraged to use pursed lip breathing since at times when he talks if he is exercising on the Recumbent Elliptical his pulse ox even on 6-8 liters of supplemental oxgyen goes down to 85% but will return to 90% or above when he focuses on his breathing and doesn't talk.    Dr. Emily Filbert is Medical Director for [place] and [facility].

## 2015-03-26 ENCOUNTER — Encounter: Payer: Medicare Other | Attending: Family Medicine | Admitting: *Deleted

## 2015-03-26 DIAGNOSIS — J42 Unspecified chronic bronchitis: Secondary | ICD-10-CM | POA: Insufficient documentation

## 2015-03-26 DIAGNOSIS — J449 Chronic obstructive pulmonary disease, unspecified: Secondary | ICD-10-CM | POA: Diagnosis not present

## 2015-03-26 DIAGNOSIS — G4733 Obstructive sleep apnea (adult) (pediatric): Secondary | ICD-10-CM

## 2015-03-26 NOTE — Progress Notes (Signed)
Pulmonary Individual Treatment Plan  Patient Details  Name: Bion Todorov MRN: [id number] Date of Birth: 1941-02-02 Referring Provider:  [name], MD  Initial Encounter Date:    Visit Diagnosis: OSA (obstructive sleep apnea)  Patient's Home Medications on Admission:  Current outpatient prescriptions:  .  aspirin 81 MG tablet, Take 81 mg by mouth every other day., Disp: , Rfl:  .  co-enzyme Q-10 30 MG capsule, Take 30 mg by mouth 2 (two) times daily., Disp: , Rfl:  .  dextroamphetamine (DEXEDRINE SPANSULE) 15 MG 24 hr capsule, Take 15 mg by mouth daily., Disp: , Rfl:  .  gabapentin (NEURONTIN) 400 MG capsule, Take by mouth., Disp: , Rfl:  .  Ginkgo Biloba 60 MG TABS, Take by mouth., Disp: , Rfl:  .  Glucosamine Sulfate 1000 MG CAPS, Take by mouth., Disp: , Rfl:  .  isosorbide mononitrate (IMDUR) 30 MG 24 hr tablet, TAKE 1 TABLET BY MOUTH EVERY DAY, Disp: , Rfl:  .  metFORMIN (GLUCOPHAGE) 500 MG tablet, TAKE 1 TABLET BY MOUTH DAILY., Disp: , Rfl:  .  metoprolol succinate (TOPROL-XL) 25 MG 24 hr tablet, TAKE 1 TABLET BY MOUTH EVERY DAY, Disp: , Rfl:  .  modafinil (PROVIGIL) 200 MG tablet, Take by mouth., Disp: , Rfl:  .  Multiple Vitamins-Minerals (MULTIVITAMIN & MINERAL PO), Take by mouth., Disp: , Rfl:  .  niacin (NIASPAN) 1000 MG CR tablet, Take by mouth., Disp: , Rfl:  .  nitroGLYCERIN (NITROSTAT) 0.4 MG SL tablet, Place under the tongue., Disp: , Rfl:  .  prasugrel (EFFIENT) 10 MG TABS tablet, TAKE 1 TABLET BY MOUTH EVERY DAY, Disp: , Rfl:  .  rosuvastatin (CRESTOR) 20 MG tablet, Take by mouth., Disp: , Rfl:  .  tiotropium (SPIRIVA) 18 MCG inhalation capsule, Place into inhaler and inhale., Disp: , Rfl:   Past Medical History: No past medical history on file.  Tobacco Use: History  Smoking status  . Former Smoker -- 2.00 packs/day for 22 years  . Types: Cigarettes  Smokeless tobacco  . 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         POCT Glucose      11/03/14 1524 11/06/14 1449 11/08/14 1130       POCT Blood Glucose   Pre-Exercise 100 mg/dL 100 mg/dL      Post-Exercise 85 mg/dL 85 mg/dL      Pre-Exercise #2  102 mg/dL      Post-Exercise #2  95 mg/dL  Peanut butter crackers given for after class on the way to lunch      Pre-Exercise #3   96 mg/dL     Post-Exercise #3   99 mg/dL        ADL UCSD:     ADL UCSD      10/31/14 1131 01/03/15 1130 02/16/15 1130   ADL UCSD   ADL Phase Entry Mid Entry   SOB Score total 75 69 85   Rest 1 1 1    Walk 1 3 3    Stairs 4 4 4    Bath 3 3 3    Dress 3 3 3    Shop 3 5 5      02/16/15 1631       ADL UCSD   ADL Phase Exit     SOB Score total 85     Rest 1     Walk 2  Stairs 4     Bath 3     Dress 3     Shop 5         Pulmonary Function Assessment:     Pulmonary Function Assessment - 10/31/14 1129    Pulmonary Function Tests   FVC% 56 %   FEV1% 61 %   FEV1/FVC Ratio 83.86   RV% 64 %   DLCO% 45 %   Breath   Bilateral Breath Sounds Clear   Shortness of Breath No;Yes      Exercise Target Goals:    Exercise Program Goal: Individual exercise prescription set with THRR, safety & activity barriers. Participant demonstrates ability to understand and report RPE using BORG scale, to self-measure pulse accurately, and to acknowledge the importance of the exercise prescription.  Exercise Prescription Goal: Starting with aerobic activity 30 plus minutes a day, 3 days per week for initial exercise prescription. Provide home exercise prescription and guidelines that participant acknowledges understanding prior to discharge.  Activity Barriers & Risk Stratification:     Activity Barriers & Risk Stratification - 10/31/14 1128    Activity Barriers & Risk Stratification   Activity Barriers None      6 Minute Walk:     6 Minute Walk      10/31/14 1702 01/03/15 1225 02/09/15 1159   6 Minute Walk    Phase Initial Mid Program Discharge   Distance 940 feet 880 feet 600 feet   Walk Time 6 minutes 6 minutes 5 minutes   Resting HR 73 bpm 105 bpm 102 bpm   Resting BP 128/76 mmHg 124/70 mmHg 158/88 mmHg   Max Ex. HR 131 bpm 136 bpm 109 bpm   Max Ex. BP 162/84 mmHg 182/82 mmHg 172/94 mmHg   RPE 15 17 14    Perceived Dyspnea  4 6 4    Symptoms No No      02/09/15 1214       6 Minute Walk   Resting HR (p) --  Pulse ox on 6 liters before 6 minute walk 98%.     Perceived Dyspnea  (p) --  Post 6 min pulse ox 88% on 6 liters oxgyen        Initial Exercise Prescription:     Initial Exercise Prescription - 10/31/14 1700    Date of Initial Exercise Prescription   Date 10/31/14   Treadmill   MPH 1.4   Grade 0   Minutes 10   Bike   Level 1   Watts 30   Minutes 10   Recumbant Bike   Level 1   RPM 30   Watts 20   Minutes 10   NuStep   Level 2   Watts 50   Minutes 10   Arm Ergometer   Level 1   Watts 10   Minutes 10   Arm/Foot Ergometer   Level 1   Watts 12   Minutes 10   Recumbant Elliptical   Level 1   RPM 40   Watts 20   Minutes 10   Elliptical   Level 1   Speed 3   Minutes 1   REL-XR   Level 2   Watts 50   Minutes 10   Prescription Details   Frequency (times per week) 3   Duration Progress to 30 minutes of continuous aerobic without signs/symptoms of physical distress   Intensity   THRR REST +  30   Ratings of Perceived Exertion 11-15   Perceived Dyspnea 2-4  Resistance Training   Training Prescription Yes   Weight 1   Reps 10-12      Exercise Prescription Changes:     Exercise Prescription Changes      11/03/14 1500 11/06/14 1400 11/10/14 1300 11/13/14 1000 11/13/14 1400   Exercise Review   Progression    Yes Yes   Response to Exercise   Blood Pressure (Admit)    130/80 mmHg 130/80 mmHg   Blood Pressure (Exercise)    160/90 mmHg 160/90 mmHg   Blood Pressure (Exit)    142/84 mmHg 142/84 mmHg   Heart Rate (Admit)     82 bpm   Heart Rate  (Exercise)     118 bpm   Heart Rate (Exit)     84 bpm   Oxygen Saturation (Admit)     92 %   Oxygen Saturation (Exercise)     88 %   Oxygen Saturation (Exit)     96 %   Rating of Perceived Exertion (Exercise)     13   Perceived Dyspnea (Exercise)     13   Intensity    THRR unchanged THRR unchanged   Progression    Continue progressive overload as per policy without signs/symptoms or physical distress. Continue progressive overload as per policy without signs/symptoms or physical distress.   Resistance Training   Training Prescription   Yes  2 lbs. Yes Yes   Weight   2 2 2    Reps    10-12 10-12   Treadmill   MPH  1.4 1.5 1.4 1.4   Grade  0  0 0   Minutes  10  14 12    NuStep   Level 2   3 3    Watts 50   30 30   Minutes 10   15 15    Recumbant Elliptical   Level  1  1 1.5   RPM  30  20 20    Watts     12   Minutes  10  10 10      12/06/14 1200 12/06/14 1500 12/08/14 1400 12/11/14 1200 01/01/15 1200   Exercise Review   Progression Yes Yes  Yes Yes   Response to Exercise   Blood Pressure (Admit) 130/80 mmHg 148/80 mmHg   118/78 mmHg   Blood Pressure (Exercise) 160/90 mmHg 142/74 mmHg   140/68 mmHg   Blood Pressure (Exit) 142/84 mmHg 140/80 mmHg   122/62 mmHg   Heart Rate (Admit) 82 bpm 95 bpm   80 bpm   Heart Rate (Exercise) 118 bpm 118 bpm   108 bpm   Heart Rate (Exit) 84 bpm 95 bpm   85 bpm   Oxygen Saturation (Admit) 92 % 94 %   91 %  6l/m continuous   Oxygen Saturation (Exercise) 88 % 90 %  6l/m continuous   86 %   Oxygen Saturation (Exit) 96 % 97 %   97 %   Rating of Perceived Exertion (Exercise) 13 13   13    Perceived Dyspnea (Exercise) 13 4   4    Intensity THRR unchanged       Progression Continue progressive overload as per policy without signs/symptoms or physical distress.       Resistance Training   Training Prescription Yes    Yes   Weight 2    4   Reps 10-12    10-12   Treadmill   MPH 1.4  1.5 1.5 1.5   Grade 0  0 0 0  Minutes 12  12 12 12    NuStep   Level  3   5 5    Watts 30   45 50   Minutes 15    15   Recumbant Elliptical   Level 2   3 5    RPM 20   60 40   Watts 15       Minutes 10    12     01/15/15 1200 01/17/15 1500 01/22/15 1300 01/31/15 1200 01/31/15 1400   Exercise Review   Progression Yes Yes Yes Yes Yes   Response to Exercise   Blood Pressure (Admit) 118/78 mmHg  118/78 mmHg 140/88 mmHg    Blood Pressure (Exercise) 140/68 mmHg  140/68 mmHg 156/80 mmHg    Blood Pressure (Exit) 122/62 mmHg  122/62 mmHg 118/80 mmHg    Heart Rate (Admit) 80 bpm  80 bpm 88 bpm    Heart Rate (Exercise) 108 bpm  108 bpm 122 bpm    Heart Rate (Exit) 85 bpm  85 bpm 98 bpm    Oxygen Saturation (Admit) 91 %  6l/m continuous  91 %  6l/m continuous 93 %    Oxygen Saturation (Exercise) 86 %  86 % 90 %    Oxygen Saturation (Exit) 97 %  97 % 94 %    Rating of Perceived Exertion (Exercise) 13  13 12     Perceived Dyspnea (Exercise) 4  4 3     Intensity  THRR unchanged      Progression  Continue progressive overload as per policy without signs/symptoms or physical distress.      Resistance Training   Training Prescription Yes Yes Yes Yes    Weight 4 5 4 5     Reps 10-12 10-12 10-12 10-12    Treadmill   MPH 1.5 1.5 1.5 1.8    Grade 0 0 0 0    Minutes 12 15 12 15     NuStep   Level 5  6 6   T5    Watts 50  50 80    Minutes 15  15 15     Recumbant Elliptical   Level 6  6 6     RPM 50  50 50    Minutes    15      02/12/15 1200 02/23/15 1500         Exercise Review   Progression Yes       Response to Exercise   Blood Pressure (Admit)  134/83 mmHg      Blood Pressure (Exercise)  140/80 mmHg      Blood Pressure (Exit)  138/70 mmHg      Heart Rate (Admit)  101 bpm      Heart Rate (Exercise)  118 bpm      Heart Rate (Exit)  97 bpm      Oxygen Saturation (Admit)  91 %      Oxygen Saturation (Exercise)  89 %      Oxygen Saturation (Exit)  99 %      Rating of Perceived Exertion (Exercise)  13      Perceived Dyspnea (Exercise)  4      Resistance  Training   Training Prescription  Yes      Weight  5      Reps  10-12      Treadmill   MPH  1.8      Grade  0      Minutes  15      NuStep  Level 7  T5 Nustep 7  T5 Nustep      Watts 60 60      Minutes  15      Recumbant Elliptical   Level  6      RPM  50      Minutes  15      Home Exercise Plan   Plans to continue exercise at  --  Mr. Magnan plans to attend another 36 sessions per MD order.         Discharge Exercise Prescription (Final Exercise Prescription Changes):     Exercise Prescription Changes - 02/23/15 1500    Response to Exercise   Blood Pressure (Admit) 134/83 mmHg   Blood Pressure (Exercise) 140/80 mmHg   Blood Pressure (Exit) 138/70 mmHg   Heart Rate (Admit) 101 bpm   Heart Rate (Exercise) 118 bpm   Heart Rate (Exit) 97 bpm   Oxygen Saturation (Admit) 91 %   Oxygen Saturation (Exercise) 89 %   Oxygen Saturation (Exit) 99 %   Rating of Perceived Exertion (Exercise) 13   Perceived Dyspnea (Exercise) 4   Resistance Training   Training Prescription Yes   Weight 5   Reps 10-12   Treadmill   MPH 1.8   Grade 0   Minutes 15   NuStep   Level 7  T5 Nustep   Watts 60   Minutes 15   Recumbant Elliptical   Level 6   RPM 50   Minutes 15   Home Exercise Plan   Plans to continue exercise at --  Mr. Coronado plans to attend another 36 sessions per MD order.       Nutrition:  Target Goals: Understanding of nutrition guidelines, daily intake of sodium <1548m, cholesterol <2072m calories 30% from fat and 7% or less from saturated fats, daily to have 5 or more servings of fruits and vegetables.  Biometrics:     Pre Biometrics - 10/31/14 1705    Pre Biometrics   Height 6' 2"  (1.88 m)   Weight (!) 342 lb 9.6 oz (155.402 kg)   Waist Circumference 55 inches   Hip Circumference 56 inches   Waist to Hip Ratio 0.98 %   BMI (Calculated) 44.1       Nutrition Therapy Plan and Nutrition Goals:     Nutrition Therapy & Goals - 12/21/14 1200     Nutrition Therapy   Diet Instructed patient on a 2000 calorie vegan meal plan, DASH diet principles.  He states that he is following Dr. EsMaurine MinisterReverse Heart Disease" diet which is vegan and eliminates all oils but admits he is having difficulty following it. Discussed the benefits of a vegan diet but stresssed that have to be careful to include adequate protein.   Fiber 20 grams   Whole Grain Foods 4 servings   Protein 8 ounces/day   Saturated Fats 13 max. grams   Fruits and Vegetables 5 servings/day   Personal Nutrition Goals   Personal Goal #1 To space meals 4-6 hours apart.   Personal Goal #2 Include a protein source at every meal. Refer to list given on meat alternatives. Goal of 75 gms protein/day.   Personal Goal #3 Use my Fitness Pal to record food/beverage intake   Personal Goal #4 In addtions to strategies discussed to help with weight loss, refer to "National Weight Loss Registry" for numerous other healthy strategies that participants have used to help lose and maintain weight loss.  Nutrition Discharge: Rate Your Plate Scores:     Rate Your Plate - 85/27/78 2423    Rate Your Plate Scores   Pre Score 55   Pre Score % 79 %  Due to vegan diet, many of the questions were  N/A      Psychosocial: Target Goals: Acknowledge presence or absence of depression, maximize coping skills, provide positive support system. Participant is able to verbalize types and ability to use techniques and skills needed for reducing stress and depression.  Initial Review & Psychosocial Screening:     Initial Psych Review & Screening - 10/31/14 0900    Initial Review   Current issues with Current Depression;Current Sleep Concerns;Current Stress Concerns   Source of Stress Concerns Chronic Illness;Unable to participate in former interests or hobbies   Daisy? Yes   Barriers   Psychosocial barriers to participate in program There are no identifiable  barriers or psychosocial needs.;The patient should benefit from training in stress management and relaxation.   Screening Interventions   Interventions Encouraged to exercise;Program counselor consult      Quality of Life Scores:     Quality of Life - 02/19/15 1130    Quality of Life Scores   Health/Function Post 9.63 %   Socioeconomic Post 14.75 %   Psych/Spiritual Post 12.64 %   Family Post 18.6 %   GLOBAL Post 12.69 %      PHQ-9:     Recent Review Flowsheet Data    Depression screen [facility] 2/9 02/19/2015 02/12/2015 10/31/2014   Decreased Interest 1 1 1    Down, Depressed, Hopeless 1 1 1    PHQ - 2 Score 2 2 2    Altered sleeping 0 0 0   Tired, decreased energy 2 2 3    Change in appetite 3 3 3    Feeling bad or failure about yourself  2 2 3    Trouble concentrating 1 1 1    Moving slowly or fidgety/restless 2 2 2    Suicidal thoughts 1 1 1    PHQ-9 Score 13 13 15    Difficult doing work/chores Somewhat difficult - Somewhat difficult      Psychosocial Evaluation and Intervention:     Psychosocial Evaluation - 11/08/14 1627    Psychosocial Evaluation & Interventions   Interventions Stress management education;Encouraged to exercise with the program and follow exercise prescription;Relaxation education   Comments Counselor met with Mr. Trammel today for initial psychosocial evaluation.  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  He lives with a spouse of 62 years and several friends whom he refers to as part of his support system.  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 Jenetta Downer reporting he doesn't use his CPAP to sleep as it is not user friendly.  Counselor reviewed the PHQ-9 with a score of "17" with Mr. Jenetta Downer confirming all his answers are current and accurate.  When assessed for depression, he denies a history and states any current symptoms are "situational"  due to his health issues primarily.  Mr. Jenetta Downer also reports current stressors are health, financial, conflict with his daughter, a close cousin with pancreatic cancer and his inability to do the things that used to bring him pleasure, such as playing the guitar/banjo with friends and playing his harmonica.  Mr. Colman Cater goals for this program are to breathe better, lose some weight, increase  his physical strength and be stronger and more able to walk/go shopping without being "tied to this big oxygen tank."  Counselor discussed Mr. Jenetta Downer mentioning to his Dr. for a medication evaluation for his depressive symptoms and possibly seeing a therpist for support currently.  He preferred to wait to see if this program helps him feel better/stronger first.  Counselor will re-assess over the next few weeks and make further recommendations at that time.      Continued Psychosocial Services Needed Yes  Mr. O will benefit from all the educational components of this program, and participated in Relaxation education on this date.  He will benefit from meeting with a dietician, and attending the depression and stress management educational components.      Psychosocial Re-Evaluation:  Education: Education Goals: Education classes will be provided on a weekly basis, covering required topics. Participant will state understanding/return demonstration of topics presented.  Learning Barriers/Preferences:     Learning Barriers/Preferences - 10/31/14 1128    Learning Barriers/Preferences   Learning Barriers None   Learning Preferences Group Instruction;Individual Instruction;Pictoral;Written Material;Video;Verbal Instruction;Skilled Demonstration      Education Topics: Initial Evaluation Education: - Verbal, written and demonstration of respiratory meds, RPE/PD scales, oximetry and breathing techniques. Instruction on use of nebulizers and MDIs: cleaning and proper use, rinsing mouth with steroid doses and importance of  monitoring MDI activations.          Pulmonary Rehab from 03/14/2015 in [place]   Date  10/31/14   Educator  LB   Instruction Review Code  2- meets goals/outcomes      General Nutrition Guidelines/Fats and Fiber: -Group instruction provided by verbal, written material, models and posters to present the general guidelines for heart healthy nutrition. Gives an explanation and review of dietary fats and fiber.      Pulmonary Rehab from 03/14/2015 in [place]   Date  02/05/15   Educator  C. Joneen Caraway, RD   Instruction Review Code  2- meets goals/outcomes      Controlling Sodium/Reading Food Labels: -Group verbal and written material supporting the discussion of sodium use in heart healthy nutrition. Review and explanation with models, verbal and written materials for utilization of the food label.      Pulmonary Rehab from 03/14/2015 in [place]   Date  02/12/15   Educator  C. Rusell, RD   Instruction Review Code  2- meets goals/outcomes      Exercise Physiology & Risk Factors: - Group verbal and written instruction with models to review the exercise physiology of the cardiovascular system and associated critical values. Details cardiovascular disease risk factors and the goals associated with each risk factor.   Aerobic Exercise & Resistance Training: - Gives group verbal and written discussion on the health impact of inactivity. On the components of aerobic and resistive training programs and the benefits of this training and how to safely progress through these programs.      Pulmonary Rehab from 03/14/2015 in [place]   Date  12/27/14   Educator  SW   Instruction Review Code  2- meets goals/outcomes      Flexibility, Balance, General Exercise Guidelines: - Provides group verbal and written instruction on the benefits of  flexibility and balance training programs. Provides general exercise guidelines with specific guidelines to those with heart or lung disease. Demonstration and skill practice provided.      Pulmonary  Rehab from 03/14/2015 in [place]   Date  01/24/15   Educator  SW   Instruction Review Code  2- meets goals/outcomes      Stress Management: - Provides group verbal and written instruction about the health risks of elevated stress, cause of high stress, and healthy ways to reduce stress.      Pulmonary Rehab from 03/14/2015 in [place]   Date  01/17/15   Educator  [facility]   Instruction Review Code  2- meets goals/outcomes      Depression: - Provides group verbal and written instruction on the correlation between heart/lung disease and depressed mood, treatment options, and the stigmas associated with seeking treatment.      Pulmonary Rehab from 03/14/2015 in [place]   Date  03/14/15   Educator  Berle Mull, MSW   Instruction Review Code  2- meets goals/outcomes      Exercise & Equipment Safety: - Individual verbal instruction and demonstration of equipment use and safety with use of the equipment.      Pulmonary Rehab from 03/14/2015 in [place]   Date  01/15/15   Educator  L. Owens Shark, RRT [facility]]   Instruction Review Code  2- meets goals/outcomes      Infection Prevention: - Provides verbal and written material to individual with discussion of infection control including proper hand washing and proper equipment cleaning during exercise session.      Pulmonary Rehab from 03/14/2015 in [place]   Date  11/03/14   Educator  Jacqulyn Ducking   Instruction Review Code  2- meets goals/outcomes      Falls Prevention: - Provides verbal and written material to individual with discussion of falls  prevention and safety.   Diabetes: - Individual verbal and written instruction to review signs/symptoms of diabetes, desired ranges of glucose level fasting, after meals and with exercise. Advice that pre and post exercise glucose checks will be done for 3 sessions at entry of program.      Pulmonary Rehab from 03/14/2015 in [place]   Date  02/23/15 [facility] your numbers]   Educator  C. Mychael Smock   Instruction Review Code  2- meets goals/outcomes      Chronic Lung Diseases: - Group verbal and written instruction to review new updates, new respiratory medications, new advancements in procedures and treatments. Provide informative websites and "800" numbers of self-education.      Pulmonary Rehab from 03/14/2015 in [place]   Date  01/29/15   Educator  C. Conde   Instruction Review Code  2- meets goals/outcomes      Lung Procedures: - Group verbal and written instruction to describe testing methods done to diagnose lung disease. Review the outcome of test results. Describe the treatment choices: Pulmonary Function Tests, ABGs and oximetry.      Pulmonary Rehab from 03/14/2015 in [place]   Date  01/26/15   Educator  [place]   Instruction Review Code  2- meets goals/outcomes      Energy Conservation: - Provide group verbal and written instruction for methods to conserve energy, plan and organize activities. Instruct on pacing techniques, use of adaptive equipment and posture/positioning to relieve shortness of breath.      Pulmonary Rehab from 03/14/2015 in [place]   Date  02/07/15   Educator  SW   Instruction Review Code  2- meets goals/outcomes      Triggers: - Group verbal and written instruction to review types of environmental controls: home humidity, furnaces, filters, dust mite/pet prevention, HEPA vacuums. To discuss  weather changes, air quality and the benefits of nasal washing.      Pulmonary Rehab from 03/14/2015 in [place]   Date  01/01/15   Educator  LB   Instruction Review Code  2- meets goals/outcomes      Exacerbations: - Group verbal and written instruction to provide: warning signs, infection symptoms, calling MD promptly, preventive modes, and value of vaccinations. Review: effective airway clearance, coughing and/or vibration techniques. Create an Sports administrator.   Oxygen: - Individual and group verbal and written instruction on oxygen therapy. Includes supplement oxygen, available portable oxygen systems, continuous and intermittent flow rates, oxygen safety, concentrators, and Medicare reimbursement for oxygen.      Pulmonary Rehab from 03/14/2015 in [place]   Date  10/31/14   Educator  LB   Instruction Review Code  2- meets goals/outcomes      Respiratory Medications: - Group verbal and written instruction to review medications for lung disease. Drug class, frequency, complications, importance of spacers, rinsing mouth after steroid MDI's, and proper cleaning methods for nebulizers.      Pulmonary Rehab from 03/14/2015 in [place]   Date  10/31/14   Educator  LB   Instruction Review Code  2- meets goals/outcomes      AED/CPR: - Group verbal and written instruction with the use of models to demonstrate the basic use of the AED with the basic ABC's of resuscitation.      Pulmonary Rehab from 03/14/2015 in [place]   Date  02/02/15   Educator  CE   Instruction Review Code  2- meets goals/outcomes      Breathing Retraining: - Provides individuals verbal and written instruction on purpose, frequency, and proper technique of diaphragmatic breathing and pursed-lipped breathing. Applies individual practice skills.      Pulmonary  Rehab from 03/14/2015 in [place]   Date  10/31/14   Educator  LB   Instruction Review Code  R- Review/reinforce      Anatomy and Physiology of the Lungs: - Group verbal and written instruction with the use of models to provide basic lung anatomy and physiology related to function, structure and complications of lung disease.   Heart Failure: - Group verbal and written instruction on the basics of heart failure: signs/symptoms, treatments, explanation of ejection fraction, enlarged heart and cardiomyopathy.      Pulmonary Rehab from 03/14/2015 in [place]   Date  01/12/15   Educator  Frederich Cha, RRT   Instruction Review Code  2- meets goals/outcomes      Sleep Apnea: - Individual verbal and written instruction to review Obstructive Sleep Apnea. Review of risk factors, methods for diagnosing and types of masks and machines for OSA.   Anxiety: - Provides group, verbal and written instruction on the correlation between heart/lung disease and anxiety, treatment options, and management of anxiety.      Pulmonary Rehab from 03/14/2015 in [place]   Date  02/14/15   Educator  Berle Mull, MSW   Instruction Review Code  2- Meets goals/outcomes  Relaxation: - Provides group, verbal and written instruction about the benefits of relaxation for patients with heart/lung disease. Also provides patients with examples of relaxation techniques.      Pulmonary Rehab from 03/14/2015 in [place]   Date  11/08/14   Educator  Lucianne Lei LCSW   Instruction Review Code  2- Meets goals/outcomes      Knowledge Questionnaire Score:     Knowledge Questionnaire Score - 02/20/15 1018    Knowledge Questionnaire Score   Post Score -1      Personal Goals and Risk Factors at Admission:     Personal Goals and Risk Factors at  Admission - 03/02/15 1130    Personal Goals and Risk Factors on Admission    Weight Management Yes      Personal Goals and Risk Factors Review:      Goals and Risk Factor Review      11/15/14 1130 11/29/14 1130 12/06/14 1130 12/20/14 1248 12/20/14 1548   Weight Management   Goals Progress/Improvement seen Yes       Comments Mr Penley has filled out the dietitian's questionnaire; it was faxed to her; and he will meet with her after the holiday.       Increase Aerobic Exercise and Physical Activity   Goals Progress/Improvement seen    Yes     Comments   Mr Zurawski has had increases with his exercise goals on the TM, T5, and TM.     Improve shortness of breath with ADL's   Goals Progress/Improvement seen    Yes     Comments   In working with Mr Azam on his oxygen and O2Sat's, he has been able to increase his exercise goals which help improve his shortness of breath, especially with the correct oxygen flow rates.     Breathing Techniques   Goals Progress/Improvement seen    Yes  Yes   Comments   Mr Sorbo works very hard to incorporate PLB with his exercise.  Encouraging Mr Bathe to use PLB with his exercise in [place]. With his low O2Sat's on the TM today, he improved his readings with the PLB. He states he really has to make himself breath deep. Encouraged him to use PLB  when walking into [place] and at his apartment in the long hallway to elevator.   Increase knowledge of respiratory medications   Goals Progress/Improvement seen   Yes      Comments  Worked with Mr Math and portable oxygen; I had him use the Eclipse which is a Systems developer and used in our gym; He was not able to maintain O2Sat's on the 6l/m intermittent and we switched him to the 6l/m gas portable with continuous flow.  I also gave him a copy of a Pulmonary PPaper which had a great article on portable concentrators.      Other Goal   Goals Progress/Improvement seen     Yes    Comments    Discussed with Brenn about a piece of exercise  equipment he has avaiable for home use and the benefits of it.      01/01/15 1234 01/08/15 1547 01/15/15 1130 01/19/15 1130 01/24/15 1130   Weight Management   Goals Progress/Improvement seen Yes  No     Comments ongoing, would like to lose 150 lbs.  Asked  Mr Demma if he had an appointment with the dietitian yet; he said he did meet with her but continues to struggle with his diet -  large portion sizes and snacking late at night; his weight is averaging  340lbs.  He has been on a [employer]  since 2006 and has helped his cardiac health.     Increase Aerobic Exercise and Physical Activity   Goals Progress/Improvement seen  Yes   Yes    Comments continue to make gains on the treadmill   Mr Cropper states his endurance is increasing. 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 He still needs to find a oxygen portable concentrator that will work for this that would cover the time involved.    Improve shortness of breath with ADL's   Goals Progress/Improvement seen  Yes Yes   Yes   Comments  Mr Manus is managing his shortness of breath in [facility] by managing  his oxygen and adjusting the flow rate to 6l/m with exercise and with some activites at home activities   Mr Shirah brought his harmonica in to class and played it for the class. We talked about the benefits of playing the harmonica on inspiratory and expiratory muscle strength and PLB technique.   Breathing Techniques   Goals Progress/Improvement seen  Yes       Comments wants to improve, also incorporate meditation       Increase knowledge of respiratory medications   Goals Progress/Improvement seen   Yes      Comments  Mr Fout has a good understanding of his Spiriva; he states that he is not sure "it works", but explained the importance of Spiriva, especially with activity.      Stress   Goal To meet with psychosocial counselor for stress and relaxation information and guidance. To state  understanding of performing relaxation techniques and or identifying personal stressors.         01/24/15 1330 01/29/15 1550 03/19/15 1130 03/21/15 1130     Weight Management   Goals Progress/Improvement seen   No     Comments   Mr Carbonneau has maintained his weight at 340-343. He continues with his [place] for his heart disease.     Understand more about Heart/Pulmonary Disease   Goals Progress/Improvement seen  Yes  Yes Yes    Comments Has enjoyed all the education classes, has trouble retaining information or recalling information, but he still enjoys the sessions and feels like they are helping him better manage his disease.   Mr Guild brought in his PFT and CT results from recent testing. We discussed his Fev1 and how it related to COPD. I plan to continue reviewing his other results. Mr Laminack is very open for  new knowledge about lung disease. I continued to educate Mr Allston on his recent PFT . I explained the FEV1, RV, and DLCO. He is being referred to a pulmonologist which he is very positive about for answers about his lung condition.    Improve shortness of breath with ADL's   Goals Progress/Improvement seen  Yes       Comments Has not seen much improvement in SOB since last month, but is still happy with his progress in the program.        Breathing Techniques   Goals Progress/Improvement seen  Yes       Comments Uses PLB when his O2 saturation is low or when he feels short of breath. He especially utilizes this during class while exercising, but also uses it around the house and when he is out.  Increase knowledge of respiratory medications   Goals Progress/Improvement seen   Yes      Comments  Discussed with Mr Corpus about use an oral device with his sleep apnea, since he can not tolerate the CPAP masks, he was open to it and gave him a list of dentists in the area who are certiified in the oral devices.         Personal Goals Discharge (Final Personal Goals and Risk Factors  Review):      Goals and Risk Factor Review - 03/21/15 1130    Understand more about Heart/Pulmonary Disease   Goals Progress/Improvement seen  Yes   Comments I continued to educate Mr Litsey on his recent PFT . I explained the FEV1, RV, and DLCO. He is being referred to a pulmonologist which he is very positive about for answers about his lung condition.      Comments: Does well on Treadmill although Sender requires 6 liters of supplemental oxygen.

## 2015-03-26 NOTE — Progress Notes (Signed)
Daily Session Note  Patient Details  Name: Miguel Palmer MRN: [id number] Date of Birth: [birth year] Referring Provider:  [name], MD  Encounter Date: 03/26/2015  Check In:     Session Check In - 03/26/15 1245    Check-In   Staff Present Laureen Owens Shark BS, RRT, Respiratory Therapist;Garret Teale RN, BSN;Steven Way BS, ACSM EP-C, Exercise Physiologist   ER physicians immediately available to respond to emergencies [facility] immediately available ER MD   Physician(s) Dr. Cinda Quest and Dr. Corky Downs   Medication changes reported     No   Fall or balance concerns reported    No   Warm-up and Cool-down Performed on first and last piece of equipment   VAD Patient? No   Pain Assessment   Currently in Pain? Yes   Pain Score 4    Pain Location Back   Pain Orientation Lower   Pain Type Chronic pain   Pain Onset More than a month ago   Pain Frequency Intermittent         Goals Met:  Proper associated with RPD/PD & O2 Sat Exercise tolerated well  Goals Unmet:  Not Applicable  Goals Comments: Does well on Treadmill although Je[place] requires 6 liters of supplemental oxygen.   Dr. Emily Filbert is Medical Director for [facility] and [facility].

## 2015-03-27 NOTE — Progress Notes (Signed)
Daily Session Note  Patient Details  Name: Miguel Palmer MRN: [id number] Date of Birth: 05-18-1941 Referring Provider:  [name], MD  Encounter Date: 03/26/2015  Check In:     Session Check In - 03/26/15 1245    Check-In   Staff Present Laureen Owens Shark BS, RRT, Respiratory Therapist;Carroll Enterkin RN, BSN;Steven Way BS, ACSM EP-C, Exercise Physiologist   ER physicians immediately available to respond to emergencies [facility] immediately available ER MD   Physician(s) Dr. Cinda Quest and Dr. Corky Downs   Medication changes reported     No   Fall or balance concerns reported    No   Warm-up and Cool-down Performed on first and last piece of equipment   VAD Patient? No   Pain Assessment   Currently in Pain? Yes   Pain Score 4    Pain Location Back   Pain Orientation Lower   Pain Type Chronic pain   Pain Onset More than a month ago   Pain Frequency Intermittent           Exercise Prescription Changes - 03/27/15 0900    Exercise Review   Progression Yes   Response to Exercise   Blood Pressure (Admit) 146/80 mmHg  Data 03/26/15   Blood Pressure (Exercise) 150/70 mmHg   Blood Pressure (Exit) 148/80 mmHg   Heart Rate (Admit) 84 bpm   Heart Rate (Exercise) 127 bpm   Heart Rate (Exit) 103 bpm   Oxygen Saturation (Admit) 97 %   Oxygen Saturation (Exercise) 88 %  8l/m C   Oxygen Saturation (Exit) 96 %   Rating of Perceived Exertion (Exercise) 13   Perceived Dyspnea (Exercise) 4   Frequency Add 1 additional day to program exercise sessions.   Duration Progress to 50 minutes of aerobic without signs/symptoms of physical distress   Intensity Rest + 30   Progression Continue progressive overload as per policy without signs/symptoms or physical distress.   Resistance Training   Training Prescription Yes   Weight 5   Reps 10-12   Treadmill   MPH 1.9   Grade 0   Minutes 15   NuStep   Level 7  T5   Watts 70   Minutes 15   Recumbant Elliptical   Level 7   RPM 60   Watts 15      Goals Met:  Proper associated with RPD/PD & O2 Sat [place] with exercise equipment Using PLB without cueing & demonstrates good technique Exercise tolerated well Strength training completed today  Goals Unmet:  Not Applicable  Goals Comments: Mr Hackenberg adjusting oxygen to 8l/m C with his exercise in [facility].   Dr. Emily Filbert is Medical Director for [facility] and [facility].

## 2015-03-28 ENCOUNTER — Encounter: Payer: Medicare Other | Admitting: *Deleted

## 2015-03-28 DIAGNOSIS — G4733 Obstructive sleep apnea (adult) (pediatric): Secondary | ICD-10-CM

## 2015-03-28 NOTE — Progress Notes (Signed)
Daily Session Note  Patient Details  Name: Miguel Palmer MRN: [id number] Date of Birth: 1941-05-01 Referring Provider:  [name], MD  Encounter Date: 03/28/2015  Check In:     Session Check In - 03/28/15 1235    Check-In   Staff Present Carson Myrtle BS, RRT, Respiratory Therapist;Bertice Risse RN, BSN;Steven Way BS, ACSM EP-C, Exercise Physiologist   ER physicians immediately available to respond to emergencies [facility] immediately available ER MD   Physician(s) Dr. Claudette Head Dr. Darl Householder   Medication changes reported     No   Fall or balance concerns reported    No   Warm-up and Cool-down Performed on first and last piece of equipment   VAD Patient? No   Pain Assessment   Currently in Pain? --  No change in chronic pain.          Goals Met:  Proper associated with RPD/PD & O2 Sat Exercise tolerated well  Goals Unmet:  Not Applicable  Goals Comments:    Dr. Emily Filbert is Medical Director for [facility] and [facility].

## 2015-04-01 DIAGNOSIS — J449 Chronic obstructive pulmonary disease, unspecified: Secondary | ICD-10-CM | POA: Insufficient documentation

## 2015-04-02 ENCOUNTER — Encounter: Payer: Medicare Other | Admitting: *Deleted

## 2015-04-02 DIAGNOSIS — J42 Unspecified chronic bronchitis: Secondary | ICD-10-CM | POA: Diagnosis not present

## 2015-04-02 DIAGNOSIS — G4733 Obstructive sleep apnea (adult) (pediatric): Secondary | ICD-10-CM

## 2015-04-02 NOTE — Progress Notes (Signed)
Pulmonary Individual Treatment Plan  Patient Details  Name: Leodan Bolyard MRN: [id number] Date of Birth: 07-31-1940 Referring Provider:  [name], MD  Initial Encounter Date:    Visit Diagnosis: OSA (obstructive sleep apnea)  Patient's Home Medications on Admission:  Current outpatient prescriptions:  .  aspirin 81 MG tablet, Take 81 mg by mouth every other day., Disp: , Rfl:  .  co-enzyme Q-10 30 MG capsule, Take 30 mg by mouth 2 (two) times daily., Disp: , Rfl:  .  dextroamphetamine (DEXEDRINE SPANSULE) 15 MG 24 hr capsule, Take 15 mg by mouth daily., Disp: , Rfl:  .  gabapentin (NEURONTIN) 400 MG capsule, Take by mouth., Disp: , Rfl:  .  Ginkgo Biloba 60 MG TABS, Take by mouth., Disp: , Rfl:  .  Glucosamine Sulfate 1000 MG CAPS, Take by mouth., Disp: , Rfl:  .  isosorbide mononitrate (IMDUR) 30 MG 24 hr tablet, TAKE 1 TABLET BY MOUTH EVERY DAY, Disp: , Rfl:  .  metFORMIN (GLUCOPHAGE) 500 MG tablet, TAKE 1 TABLET BY MOUTH DAILY., Disp: , Rfl:  .  metoprolol succinate (TOPROL-XL) 25 MG 24 hr tablet, TAKE 1 TABLET BY MOUTH EVERY DAY, Disp: , Rfl:  .  modafinil (PROVIGIL) 200 MG tablet, Take by mouth., Disp: , Rfl:  .  Multiple Vitamins-Minerals (MULTIVITAMIN & MINERAL PO), Take by mouth., Disp: , Rfl:  .  niacin (NIASPAN) 1000 MG CR tablet, Take by mouth., Disp: , Rfl:  .  nitroGLYCERIN (NITROSTAT) 0.4 MG SL tablet, Place under the tongue., Disp: , Rfl:  .  prasugrel (EFFIENT) 10 MG TABS tablet, TAKE 1 TABLET BY MOUTH EVERY DAY, Disp: , Rfl:  .  rosuvastatin (CRESTOR) 20 MG tablet, Take by mouth., Disp: , Rfl:  .  tiotropium (SPIRIVA) 18 MCG inhalation capsule, Place into inhaler and inhale., Disp: , Rfl:   Past Medical History: No past medical history on file.  Tobacco Use: History  Smoking status  . Former Smoker -- 2.00 packs/day for 22 years  . Types: Cigarettes  Smokeless tobacco  . 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         POCT Glucose      11/03/14 1524 11/06/14 1449 11/08/14 1130       POCT Blood Glucose   Pre-Exercise 100 mg/dL 100 mg/dL      Post-Exercise 85 mg/dL 85 mg/dL      Pre-Exercise #2  102 mg/dL      Post-Exercise #2  95 mg/dL  Peanut butter crackers given for after class on the way to lunch      Pre-Exercise #3   96 mg/dL     Post-Exercise #3   99 mg/dL        ADL UCSD:     ADL UCSD      10/31/14 1131 01/03/15 1130 02/16/15 1130   ADL UCSD   ADL Phase Entry Mid Entry   SOB Score total 75 69 85   Rest 1 1 1    Walk 1 3 3    Stairs 4 4 4    Bath 3 3 3    Dress 3 3 3    Shop 3 5 5      02/16/15 1631       ADL UCSD   ADL Phase Exit     SOB Score total 85     Rest 1     Walk 2  Stairs 4     Bath 3     Dress 3     Shop 5         Pulmonary Function Assessment:     Pulmonary Function Assessment - 10/31/14 1129    Pulmonary Function Tests   FVC% 56 %   FEV1% 61 %   FEV1/FVC Ratio 83.86   RV% 64 %   DLCO% 45 %   Breath   Bilateral Breath Sounds Clear   Shortness of Breath No;Yes      Exercise Target Goals:    Exercise Program Goal: Individual exercise prescription set with THRR, safety & activity barriers. Participant demonstrates ability to understand and report RPE using BORG scale, to self-measure pulse accurately, and to acknowledge the importance of the exercise prescription.  Exercise Prescription Goal: Starting with aerobic activity 30 plus minutes a day, 3 days per week for initial exercise prescription. Provide home exercise prescription and guidelines that participant acknowledges understanding prior to discharge.  Activity Barriers & Risk Stratification:     Activity Barriers & Risk Stratification - 10/31/14 1128    Activity Barriers & Risk Stratification   Activity Barriers None      6 Minute Walk:     6 Minute Walk      10/31/14 1702 01/03/15 1225 02/09/15 1159   6 Minute Walk    Phase Initial Mid Program Discharge   Distance 940 feet 880 feet 600 feet   Walk Time 6 minutes 6 minutes 5 minutes   Resting HR 73 bpm 105 bpm 102 bpm   Resting BP 128/76 mmHg 124/70 mmHg 158/88 mmHg   Max Ex. HR 131 bpm 136 bpm 109 bpm   Max Ex. BP 162/84 mmHg 182/82 mmHg 172/94 mmHg   RPE 15 17 14    Perceived Dyspnea  4 6 4    Symptoms No No      02/09/15 1214       6 Minute Walk   Resting HR (p) --  Pulse ox on 6 liters before 6 minute walk 98%.     Perceived Dyspnea  (p) --  Post 6 min pulse ox 88% on 6 liters oxgyen        Initial Exercise Prescription:     Initial Exercise Prescription - 10/31/14 1700    Date of Initial Exercise Prescription   Date 10/31/14   Treadmill   MPH 1.4   Grade 0   Minutes 10   Bike   Level 1   Watts 30   Minutes 10   Recumbant Bike   Level 1   RPM 30   Watts 20   Minutes 10   NuStep   Level 2   Watts 50   Minutes 10   Arm Ergometer   Level 1   Watts 10   Minutes 10   Arm/Foot Ergometer   Level 1   Watts 12   Minutes 10   Recumbant Elliptical   Level 1   RPM 40   Watts 20   Minutes 10   Elliptical   Level 1   Speed 3   Minutes 1   REL-XR   Level 2   Watts 50   Minutes 10   Prescription Details   Frequency (times per week) 3   Duration Progress to 30 minutes of continuous aerobic without signs/symptoms of physical distress   Intensity   THRR REST +  30   Ratings of Perceived Exertion 11-15   Perceived Dyspnea 2-4  Resistance Training   Training Prescription Yes   Weight 1   Reps 10-12      Exercise Prescription Changes:     Exercise Prescription Changes      11/03/14 1500 11/06/14 1400 11/10/14 1300 11/13/14 1000 11/13/14 1400   Exercise Review   Progression    Yes Yes   Response to Exercise   Blood Pressure (Admit)    130/80 mmHg 130/80 mmHg   Blood Pressure (Exercise)    160/90 mmHg 160/90 mmHg   Blood Pressure (Exit)    142/84 mmHg 142/84 mmHg   Heart Rate (Admit)     82 bpm   Heart Rate  (Exercise)     118 bpm   Heart Rate (Exit)     84 bpm   Oxygen Saturation (Admit)     92 %   Oxygen Saturation (Exercise)     88 %   Oxygen Saturation (Exit)     96 %   Rating of Perceived Exertion (Exercise)     13   Perceived Dyspnea (Exercise)     13   Intensity    THRR unchanged THRR unchanged   Progression    Continue progressive overload as per policy without signs/symptoms or physical distress. Continue progressive overload as per policy without signs/symptoms or physical distress.   Resistance Training   Training Prescription   Yes  2 lbs. Yes Yes   Weight   2 2 2    Reps    10-12 10-12   Treadmill   MPH  1.4 1.5 1.4 1.4   Grade  0  0 0   Minutes  10  14 12    NuStep   Level 2   3 3    Watts 50   30 30   Minutes 10   15 15    Recumbant Elliptical   Level  1  1 1.5   RPM  30  20 20    Watts     12   Minutes  10  10 10      12/06/14 1200 12/06/14 1500 12/08/14 1400 12/11/14 1200 01/01/15 1200   Exercise Review   Progression Yes Yes  Yes Yes   Response to Exercise   Blood Pressure (Admit) 130/80 mmHg 148/80 mmHg   118/78 mmHg   Blood Pressure (Exercise) 160/90 mmHg 142/74 mmHg   140/68 mmHg   Blood Pressure (Exit) 142/84 mmHg 140/80 mmHg   122/62 mmHg   Heart Rate (Admit) 82 bpm 95 bpm   80 bpm   Heart Rate (Exercise) 118 bpm 118 bpm   108 bpm   Heart Rate (Exit) 84 bpm 95 bpm   85 bpm   Oxygen Saturation (Admit) 92 % 94 %   91 %  6l/m continuous   Oxygen Saturation (Exercise) 88 % 90 %  6l/m continuous   86 %   Oxygen Saturation (Exit) 96 % 97 %   97 %   Rating of Perceived Exertion (Exercise) 13 13   13    Perceived Dyspnea (Exercise) 13 4   4    Intensity THRR unchanged       Progression Continue progressive overload as per policy without signs/symptoms or physical distress.       Resistance Training   Training Prescription Yes    Yes   Weight 2    4   Reps 10-12    10-12   Treadmill   MPH 1.4  1.5 1.5 1.5   Grade 0  0 0 0  Minutes 12  12 12 12    NuStep   Level  3   5 5    Watts 30   45 50   Minutes 15    15   Recumbant Elliptical   Level 2   3 5    RPM 20   60 40   Watts 15       Minutes 10    12     01/15/15 1200 01/17/15 1500 01/22/15 1300 01/31/15 1200 01/31/15 1400   Exercise Review   Progression Yes Yes Yes Yes Yes   Response to Exercise   Blood Pressure (Admit) 118/78 mmHg  118/78 mmHg 140/88 mmHg    Blood Pressure (Exercise) 140/68 mmHg  140/68 mmHg 156/80 mmHg    Blood Pressure (Exit) 122/62 mmHg  122/62 mmHg 118/80 mmHg    Heart Rate (Admit) 80 bpm  80 bpm 88 bpm    Heart Rate (Exercise) 108 bpm  108 bpm 122 bpm    Heart Rate (Exit) 85 bpm  85 bpm 98 bpm    Oxygen Saturation (Admit) 91 %  6l/m continuous  91 %  6l/m continuous 93 %    Oxygen Saturation (Exercise) 86 %  86 % 90 %    Oxygen Saturation (Exit) 97 %  97 % 94 %    Rating of Perceived Exertion (Exercise) 13  13 12     Perceived Dyspnea (Exercise) 4  4 3     Intensity  THRR unchanged      Progression  Continue progressive overload as per policy without signs/symptoms or physical distress.      Resistance Training   Training Prescription Yes Yes Yes Yes    Weight 4 5 4 5     Reps 10-12 10-12 10-12 10-12    Treadmill   MPH 1.5 1.5 1.5 1.8    Grade 0 0 0 0    Minutes 12 15 12 15     NuStep   Level 5  6 6   T5    Watts 50  50 80    Minutes 15  15 15     Recumbant Elliptical   Level 6  6 6     RPM 50  50 50    Minutes    15      02/12/15 1200 02/23/15 1500 03/27/15 0900       Exercise Review   Progression Yes  Yes     Response to Exercise   Blood Pressure (Admit)  134/83 mmHg 146/80 mmHg  Data 03/26/15     Blood Pressure (Exercise)  140/80 mmHg 150/70 mmHg     Blood Pressure (Exit)  138/70 mmHg 148/80 mmHg     Heart Rate (Admit)  101 bpm 84 bpm     Heart Rate (Exercise)  118 bpm 127 bpm     Heart Rate (Exit)  97 bpm 103 bpm     Oxygen Saturation (Admit)  91 % 97 %     Oxygen Saturation (Exercise)  89 % 88 %  8l/m C     Oxygen Saturation (Exit)  99 % 96 %      Rating of Perceived Exertion (Exercise)  13 13     Perceived Dyspnea (Exercise)  4 4     Frequency   Add 1 additional day to program exercise sessions.     Duration   Progress to 50 minutes of aerobic without signs/symptoms of physical distress     Intensity   Rest + 30     Progression  Continue progressive overload as per policy without signs/symptoms or physical distress.     Resistance Training   Training Prescription  Yes Yes     Weight  5 5     Reps  10-12 10-12     Treadmill   MPH  1.8 1.9     Grade  0 0     Minutes  15 15     NuStep   Level 7  T5 Nustep 7  T5 Nustep 7  T5     Watts 60 60 70     Minutes  15 15     Recumbant Elliptical   Level  6 7     RPM  50 60     Watts   15     Minutes  15      Home Exercise Plan   Plans to continue exercise at  --  Mr. Follette plans to attend another 36 sessions per MD order.         Discharge Exercise Prescription (Final Exercise Prescription Changes):     Exercise Prescription Changes - 03/27/15 0900    Exercise Review   Progression Yes   Response to Exercise   Blood Pressure (Admit) 146/80 mmHg  Data 03/26/15   Blood Pressure (Exercise) 150/70 mmHg   Blood Pressure (Exit) 148/80 mmHg   Heart Rate (Admit) 84 bpm   Heart Rate (Exercise) 127 bpm   Heart Rate (Exit) 103 bpm   Oxygen Saturation (Admit) 97 %   Oxygen Saturation (Exercise) 88 %  8l/m C   Oxygen Saturation (Exit) 96 %   Rating of Perceived Exertion (Exercise) 13   Perceived Dyspnea (Exercise) 4   Frequency Add 1 additional day to program exercise sessions.   Duration Progress to 50 minutes of aerobic without signs/symptoms of physical distress   Intensity Rest + 30   Progression Continue progressive overload as per policy without signs/symptoms or physical distress.   Resistance Training   Training Prescription Yes   Weight 5   Reps 10-12   Treadmill   MPH 1.9   Grade 0   Minutes 15   NuStep   Level 7  T5   Watts 70   Minutes 15   Recumbant  Elliptical   Level 7   RPM 60   Watts 15       Nutrition:  Target Goals: Understanding of nutrition guidelines, daily intake of sodium <1563m, cholesterol <2054m calories 30% from fat and 7% or less from saturated fats, daily to have 5 or more servings of fruits and vegetables.  Biometrics:     Pre Biometrics - 10/31/14 1705    Pre Biometrics   Height 6' 2"  (1.88 m)   Weight (!) 342 lb 9.6 oz (155.402 kg)   Waist Circumference 55 inches   Hip Circumference 56 inches   Waist to Hip Ratio 0.98 %   BMI (Calculated) 44.1       Nutrition Therapy Plan and Nutrition Goals:     Nutrition Therapy & Goals - 12/21/14 1200    Nutrition Therapy   Diet Instructed patient on a 2000 calorie vegan meal plan, DASH diet principles.  He states that he is following Dr. EsMaurine MinisterReverse Heart Disease" diet which is vegan and eliminates all oils but admits he is having difficulty following it. Discussed the benefits of a vegan diet but stresssed that have to be careful to include adequate protein.   Fiber 20 grams   Whole Grain Foods 4  servings   Protein 8 ounces/day   Saturated Fats 13 max. grams   Fruits and Vegetables 5 servings/day   Personal Nutrition Goals   Personal Goal #1 To space meals 4-6 hours apart.   Personal Goal #2 Include a protein source at every meal. Refer to list given on meat alternatives. Goal of 75 gms protein/day.   Personal Goal #3 Use my Fitness Pal to record food/beverage intake   Personal Goal #4 In addtions to strategies discussed to help with weight loss, refer to "National Weight Loss Registry" for numerous other healthy strategies that participants have used to help lose and maintain weight loss.        Nutrition Discharge: Rate Your Plate Scores:     Rate Your Plate - 16/10/96 0454    Rate Your Plate Scores   Pre Score 55   Pre Score % 79 %  Due to vegan diet, many of the questions were  N/A      Psychosocial: Target Goals: Acknowledge  presence or absence of depression, maximize coping skills, provide positive support system. Participant is able to verbalize types and ability to use techniques and skills needed for reducing stress and depression.  Initial Review & Psychosocial Screening:     Initial Psych Review & Screening - 10/31/14 0900    Initial Review   Current issues with Current Depression;Current Sleep Concerns;Current Stress Concerns   Source of Stress Concerns Chronic Illness;Unable to participate in former interests or hobbies   [place]? Yes   Barriers   Psychosocial barriers to participate in program There are no identifiable barriers or psychosocial needs.;The patient should benefit from training in stress management and relaxation.   Screening Interventions   Interventions Encouraged to exercise;Program counselor consult      Quality of Life Scores:     Quality of Life - 02/19/15 1130    Quality of Life Scores   Health/Function Post 9.63 %   Socioeconomic Post 14.75 %   Psych/Spiritual Post 12.64 %   Family Post 18.6 %   GLOBAL Post 12.69 %      PHQ-9:     Recent Review Flowsheet Data    Depression screen [facility] 2/9 02/19/2015 02/12/2015 10/31/2014   Decreased Interest 1 1 1    Down, Depressed, Hopeless 1 1 1    PHQ - 2 Score 2 2 2    Altered sleeping 0 0 0   Tired, decreased energy 2 2 3    Change in appetite 3 3 3    Feeling bad or failure about yourself  2 2 3    Trouble concentrating 1 1 1    Moving slowly or fidgety/restless 2 2 2    Suicidal thoughts 1 1 1    PHQ-9 Score 13 13 15    Difficult doing work/chores Somewhat difficult - Somewhat difficult      Psychosocial Evaluation and Intervention:     Psychosocial Evaluation - 11/08/14 1627    Psychosocial Evaluation & Interventions   Interventions Stress management education;Encouraged to exercise with the program and follow exercise prescription;Relaxation education   Comments Counselor met with Mr. Merrick  today for initial psychosocial evaluation.  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  He lives with a spouse of 84 years and several friends whom he refers to as part of his support system.  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  O reporting he doesn't use his CPAP to sleep as it is not user friendly.  Counselor reviewed the PHQ-9 with a score of "17" with Mr. Jenetta Downer confirming all his answers are current and accurate.  When assessed for depression, he denies a history and states any current symptoms are "situational" due to his health issues primarily.  Mr. Jenetta Downer also reports current stressors are health, financial, conflict with his daughter, a close cousin with pancreatic cancer and his inability to do the things that used to bring him pleasure, such as playing the guitar/banjo with friends and playing his harmonica.  Mr. Colman Cater goals for this program are to breathe better, lose some weight, increase his physical strength and be stronger and more able to walk/go shopping without being "tied to this big oxygen tank."  Counselor discussed Mr. Jenetta Downer mentioning to his Dr. for a medication evaluation for his depressive symptoms and possibly seeing a therpist for support currently.  He preferred to wait to see if this program helps him feel better/stronger first.  Counselor will re-assess over the next few weeks and make further recommendations at that time.      Continued Psychosocial Services Needed Yes  Mr. O will benefit from all the educational components of this program, and participated in Relaxation education on this date.  He will benefit from meeting with a dietician, and attending the depression and stress management educational components.      Psychosocial Re-Evaluation:  Education: Education Goals: Education classes will be provided on a weekly basis, covering required topics.  Participant will state understanding/return demonstration of topics presented.  Learning Barriers/Preferences:     Learning Barriers/Preferences - 10/31/14 1128    Learning Barriers/Preferences   Learning Barriers None   Learning Preferences Group Instruction;Individual Instruction;Pictoral;Written Material;Video;Verbal Instruction;Skilled Demonstration      Education Topics: Initial Evaluation Education: - Verbal, written and demonstration of respiratory meds, RPE/PD scales, oximetry and breathing techniques. Instruction on use of nebulizers and MDIs: cleaning and proper use, rinsing mouth with steroid doses and importance of monitoring MDI activations.          Pulmonary Rehab from 03/14/2015 in [place]   Date  10/31/14   Educator  LB   Instruction Review Code  2- meets goals/outcomes      General Nutrition Guidelines/Fats and Fiber: -Group instruction provided by verbal, written material, models and posters to present the general guidelines for heart healthy nutrition. Gives an explanation and review of dietary fats and fiber.      Pulmonary Rehab from 03/14/2015 in [place]   Date  02/05/15   Educator  C. Joneen Caraway, RD   Instruction Review Code  2- meets goals/outcomes      Controlling Sodium/Reading Food Labels: -Group verbal and written material supporting the discussion of sodium use in heart healthy nutrition. Review and explanation with models, verbal and written materials for utilization of the food label.      Pulmonary Rehab from 03/14/2015 in [place]   Date  02/12/15   Educator  C. Rusell, RD   Instruction Review Code  2- meets goals/outcomes      Exercise Physiology & Risk Factors: - Group verbal and written instruction with models to review the exercise physiology of the cardiovascular system and associated critical values. Details  cardiovascular disease risk factors and the goals associated with each risk factor.   Aerobic Exercise & Resistance Training: - Gives group verbal and  written discussion on the health impact of inactivity. On the components of aerobic and resistive training programs and the benefits of this training and how to safely progress through these programs.      Pulmonary Rehab from 03/14/2015 in [place]   Date  12/27/14   Educator  SW   Instruction Review Code  2- meets goals/outcomes      Flexibility, Balance, General Exercise Guidelines: - Provides group verbal and written instruction on the benefits of flexibility and balance training programs. Provides general exercise guidelines with specific guidelines to those with heart or lung disease. Demonstration and skill practice provided.      Pulmonary Rehab from 03/14/2015 in [place]   Date  01/24/15   Educator  SW   Instruction Review Code  2- meets goals/outcomes      Stress Management: - Provides group verbal and written instruction about the health risks of elevated stress, cause of high stress, and healthy ways to reduce stress.      Pulmonary Rehab from 03/14/2015 in [place]   Date  01/17/15   Educator  [facility]   Instruction Review Code  2- meets goals/outcomes      Depression: - Provides group verbal and written instruction on the correlation between heart/lung disease and depressed mood, treatment options, and the stigmas associated with seeking treatment.      Pulmonary Rehab from 03/14/2015 in [place]   Date  03/14/15   Educator  Berle Mull, MSW   Instruction Review Code  2- meets goals/outcomes      Exercise & Equipment Safety: - Individual verbal instruction and demonstration of equipment use and safety with use of the equipment.      Pulmonary Rehab from 03/14/2015  in [place]   Date  01/15/15   Educator  L. Owens Shark, RRT [facility] [place]]   Instruction Review Code  2- meets goals/outcomes      Infection Prevention: - Provides verbal and written material to individual with discussion of infection control including proper hand washing and proper equipment cleaning during exercise session.      Pulmonary Rehab from 03/14/2015 in [place]   Date  11/03/14   Educator  Jacqulyn Ducking   Instruction Review Code  2- meets goals/outcomes      Falls Prevention: - Provides verbal and written material to individual with discussion of falls prevention and safety.   Diabetes: - Individual verbal and written instruction to review signs/symptoms of diabetes, desired ranges of glucose level fasting, after meals and with exercise. Advice that pre and post exercise glucose checks will be done for 3 sessions at entry of program.      Pulmonary Rehab from 03/14/2015 in [place]   Date  02/23/15 [facility] your numbers]   Educator  C. Lennix Kneisel   Instruction Review Code  2- meets goals/outcomes      Chronic Lung Diseases: - Group verbal and written instruction to review new updates, new respiratory medications, new advancements in procedures and treatments. Provide informative websites and "800" numbers of self-education.      Pulmonary Rehab from 03/14/2015 in [place]   Date  01/29/15   Educator  C. EnterkinRN   Instruction Review Code  2- meets goals/outcomes      Lung Procedures: - Group verbal and written instruction  to describe testing methods done to diagnose lung disease. Review the outcome of test results. Describe the treatment choices: Pulmonary Function Tests, ABGs and oximetry.      Pulmonary Rehab from 03/14/2015 in [place]   Date  01/26/15   Educator  [place]    Instruction Review Code  2- meets goals/outcomes      Energy Conservation: - Provide group verbal and written instruction for methods to conserve energy, plan and organize activities. Instruct on pacing techniques, use of adaptive equipment and posture/positioning to relieve shortness of breath.      Pulmonary Rehab from 03/14/2015 in [place]   Date  02/07/15   Educator  SW   Instruction Review Code  2- meets goals/outcomes      Triggers: - Group verbal and written instruction to review types of environmental controls: home humidity, furnaces, filters, dust mite/pet prevention, HEPA vacuums. To discuss weather changes, air quality and the benefits of nasal washing.      Pulmonary Rehab from 03/14/2015 in [place]   Date  01/01/15   Educator  LB   Instruction Review Code  2- meets goals/outcomes      Exacerbations: - Group verbal and written instruction to provide: warning signs, infection symptoms, calling MD promptly, preventive modes, and value of vaccinations. Review: effective airway clearance, coughing and/or vibration techniques. Create an Sports administrator.   Oxygen: - Individual and group verbal and written instruction on oxygen therapy. Includes supplement oxygen, available portable oxygen systems, continuous and intermittent flow rates, oxygen safety, concentrators, and Medicare reimbursement for oxygen.      Pulmonary Rehab from 03/14/2015 in [place]   Date  10/31/14   Educator  LB   Instruction Review Code  2- meets goals/outcomes      Respiratory Medications: - Group verbal and written instruction to review medications for lung disease. Drug class, frequency, complications, importance of spacers, rinsing mouth after steroid MDI's, and proper cleaning methods for nebulizers.      Pulmonary Rehab from 03/14/2015 in [place]   Date  10/31/14   Educator  LB   Instruction Review Code  2- meets goals/outcomes      AED/CPR: - Group verbal and written instruction with the use of models to demonstrate the basic use of the AED with the basic ABC's of resuscitation.      Pulmonary Rehab from 03/14/2015 in [place]   Date  02/02/15   Educator  CE   Instruction Review Code  2- meets goals/outcomes      Breathing Retraining: - Provides individuals verbal and written instruction on purpose, frequency, and proper technique of diaphragmatic breathing and pursed-lipped breathing. Applies individual practice skills.      Pulmonary Rehab from 03/14/2015 in [place]   Date  10/31/14   Educator  LB   Instruction Review Code  R- Review/reinforce      Anatomy and Physiology of the Lungs: - Group verbal and written instruction with the use of models to provide basic lung anatomy and physiology related to function, structure and complications of lung disease.   Heart Failure: - Group verbal and written instruction on the basics of heart failure: signs/symptoms, treatments, explanation of ejection fraction, enlarged heart and cardiomyopathy.      Pulmonary Rehab from 03/14/2015 in [place]  Date  01/12/15   Educator  Frederich Cha, RRT   Instruction Review Code  2- meets goals/outcomes      Sleep Apnea: - Individual verbal and written instruction to review Obstructive Sleep Apnea. Review of risk factors, methods for diagnosing and types of masks and machines for OSA.   Anxiety: - Provides group, verbal and written instruction on the correlation between heart/lung disease and anxiety, treatment options, and management of anxiety.      Pulmonary Rehab from 03/14/2015 in [place]   Date  02/14/15   Educator  Berle Mull, MSW   Instruction Review Code  2-  Meets goals/outcomes      Relaxation: - Provides group, verbal and written instruction about the benefits of relaxation for patients with heart/lung disease. Also provides patients with examples of relaxation techniques.      Pulmonary Rehab from 03/14/2015 in [place]   Date  11/08/14   Educator  Lucianne Lei LCSW   Instruction Review Code  2- Meets goals/outcomes      Knowledge Questionnaire Score:     Knowledge Questionnaire Score - 02/20/15 1018    Knowledge Questionnaire Score   Post Score -1      Personal Goals and Risk Factors at Admission:     Personal Goals and Risk Factors at Admission - 03/02/15 1130    Personal Goals and Risk Factors on Admission    Weight Management Yes      Personal Goals and Risk Factors Review:      Goals and Risk Factor Review      11/15/14 1130 11/29/14 1130 12/06/14 1130 12/20/14 1248 12/20/14 1548   Weight Management   Goals Progress/Improvement seen Yes       Comments Mr Seiden has filled out the dietitian's questionnaire; it was faxed to her; and he will meet with her after the holiday.       Increase Aerobic Exercise and Physical Activity   Goals Progress/Improvement seen    Yes     Comments   Mr Fraley has had increases with his exercise goals on the TM, T5, and TM.     Improve shortness of breath with ADL's   Goals Progress/Improvement seen    Yes     Comments   In working with Mr Musich on his oxygen and O2Sat's, he has been able to increase his exercise goals which help improve his shortness of breath, especially with the correct oxygen flow rates.     Breathing Techniques   Goals Progress/Improvement seen    Yes  Yes   Comments   Mr Nine works very hard to incorporate PLB with his exercise.  Encouraging Mr Schaner to use PLB with his exercise in [place]. With his low O2Sat's on the TM today, he improved his readings with the PLB. He states he really has to make himself breath deep. Encouraged  him to use PLB  when walking into [place] and at his apartment in the long hallway to elevator.   Increase knowledge of respiratory medications   Goals Progress/Improvement seen   Yes      Comments  Worked with Mr Liz and portable oxygen; I had him use the [employer] which is a Systems developer and used in our gym; He was not able to maintain O2Sat's on the 6l/m intermittent and we switched him to the 6l/m gas portable with continuous flow.  I also gave him a copy of a Pulmonary PPaper which  had a great article on portable concentrators.      Other Goal   Goals Progress/Improvement seen     Yes    Comments    Discussed with [place] about a piece of exercise equipment he has avaiable for home use and the benefits of it.      01/01/15 1234 01/08/15 1547 01/15/15 1130 01/19/15 1130 01/24/15 1130   Weight Management   Goals Progress/Improvement seen Yes  No     Comments ongoing, would like to lose 150 lbs.  Asked  Mr Guster if he had an appointment with the dietitian yet; he said he did meet with her but continues to struggle with his diet - large portion sizes and snacking late at night; his weight is averaging  340lbs.  He has been on a [employer]  since 2006 and has helped his cardiac health.     Increase Aerobic Exercise and Physical Activity   Goals Progress/Improvement seen  Yes   Yes    Comments continue to make gains on the treadmill   Mr Fredin states his endurance is increasing. 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 He still needs to find a oxygen portable concentrator that will work for this that would cover the time involved.    Improve shortness of breath with ADL's   Goals Progress/Improvement seen  Yes Yes   Yes   Comments  Mr Due is managing his shortness of breath in [facility] by managing  his oxygen and adjusting the flow rate to 6l/m with exercise and with some activites at home activities   Mr Olmeda brought his harmonica in to class and  played it for the class. We talked about the benefits of playing the harmonica on inspiratory and expiratory muscle strength and PLB technique.   Breathing Techniques   Goals Progress/Improvement seen  Yes       Comments wants to improve, also incorporate meditation       Increase knowledge of respiratory medications   Goals Progress/Improvement seen   Yes      Comments  Mr Favata has a good understanding of his Spiriva; he states that he is not sure "it works", but explained the importance of Spiriva, especially with activity.      Stress   Goal To meet with psychosocial counselor for stress and relaxation information and guidance. To state understanding of performing relaxation techniques and or identifying personal stressors.         01/24/15 1330 01/29/15 1550 03/19/15 1130 03/21/15 1130 03/26/15 1130   Weight Management   Goals Progress/Improvement seen   No     Comments   Mr Stoudt has maintained his weight at 340-343. He continues with his [place] for his heart disease.     Understand more about Heart/Pulmonary Disease   Goals Progress/Improvement seen  Yes  Yes Yes    Comments Has enjoyed all the education classes, has trouble retaining information or recalling information, but he still enjoys the sessions and feels like they are helping him better manage his disease.   Mr Litzinger brought in his PFT and CT results from recent testing. We discussed his Fev1 and how it related to COPD. I plan to continue reviewing his other results. Mr Saavedra is very open for  new knowledge about lung disease. I continued to educate Mr Dipiero on his recent PFT . I explained the FEV1, RV, and DLCO. He is  being referred to a pulmonologist which he is very positive about for answers about his lung condition.    Improve shortness of breath with ADL's   Goals Progress/Improvement seen  Yes    Yes   Comments Has not seen much improvement in SOB since last month, but is still happy with his progress in the program.      Mr Fouse has good days and bad days with his breathing. He is adjusting his oxygen with exercise and pacing himself with activites. He is looking forward to consulting with a pulmonologist for more detail about his shortness of breath and breathing.   Breathing Techniques   Goals Progress/Improvement seen  Yes    Yes   Comments Uses PLB when his O2 saturation is low or when he feels short of breath. He especially utilizes this during class while exercising, but also uses it around the house and when he is out.     Mr Massar uses PLB, especially on the TM and has maintined acceptable O2Sat with this exercise.   Increase knowledge of respiratory medications   Goals Progress/Improvement seen   Yes   Yes   Comments  Discussed with Mr Jaquith about use an oral device with his sleep apnea, since he can not tolerate the CPAP masks, he was open to it and gave him a list of dentists in the area who are certiified in the oral devices.   Mr Bala is taking his Spiriva daily. So far, the "E" cylinders are his portal system for oxygen.   Diabetes   Goal     --  No problems with his diabete in [facility]   Hypertension   Goal     --  Maintaining acceptable BP at rest wnd with exercise in [facility].   Abnormal Lipids   Goal     --  No problems with cholesterol reading in [facility].   Stress   Goal     --  Mr Granquist has met with the counselor in [place]. I have educated him on his  pulmonary test results and on portable concentrators.     04/02/15 0853           Increase Aerobic Exercise and Physical Activity   Goals Progress/Improvement seen  No       Comments Has not seen much improvement, however, has been remaining stable which is key for him at the juncture.          Personal Goals Discharge (Final Personal Goals and Risk Factors Review):      Goals and Risk Factor Review - 04/02/15 0853    Increase Aerobic Exercise and Physical Activity   Goals Progress/Improvement seen  No   Comments Has not  seen much improvement, however, has been remaining stable which is key for him at the juncture.      Comments: 30 day note

## 2015-04-02 NOTE — Progress Notes (Signed)
Daily Session Note  Patient Details  Name: Dartanyon Frankowski MRN: [id number] Date of Birth: 1941-05-09 Referring Provider:  [name], MD  Encounter Date: 04/02/2015  Check In:     Session Check In - 04/02/15 1251    Check-In   Staff Present Carson Myrtle BS, RRT, Respiratory Therapist;Mirella Gueye RN, BSN;Steven Way BS, ACSM EP-C, Exercise Physiologist   ER physicians immediately available to respond to emergencies [facility] immediately available ER MD   Physician(s) Dr. Clearnce Hasten and Dr. Corky Downs   Medication changes reported     No   Warm-up and Cool-down Performed on first and last piece of equipment   VAD Patient? No   Pain Assessment   Currently in Pain? No/denies         Goals Met:  Proper associated with RPD/PD & O2 Sat Exercise tolerated well  Goals Unmet:  Unable to go up on treadmill so stayed on 1.52mh.   Goals Comments: 30 day note. Moishy is on 8liters of oxygen and unable to go up on TM.    Dr. MEmily Filbertis Medical Director for [facility].

## 2015-04-02 NOTE — Progress Notes (Signed)
Pulmonary Individual Treatment Plan  Patient Details  Name: Miguel Palmer MRN: [id number] Date of Birth: 12/21/1940 Referring Provider:  [name], MD  Initial Encounter Date:    Visit Diagnosis: OSA (obstructive sleep apnea) - Plan: CARDIAC REHAB 30 DAY REVIEW  Patient's Home Medications on Admission:  Current outpatient prescriptions:  .  aspirin 81 MG tablet, Take 81 mg by mouth every other day., Disp: , Rfl:  .  co-enzyme Q-10 30 MG capsule, Take 30 mg by mouth 2 (two) times daily., Disp: , Rfl:  .  dextroamphetamine (DEXEDRINE SPANSULE) 15 MG 24 hr capsule, Take 15 mg by mouth daily., Disp: , Rfl:  .  gabapentin (NEURONTIN) 400 MG capsule, Take by mouth., Disp: , Rfl:  .  Ginkgo Biloba 60 MG TABS, Take by mouth., Disp: , Rfl:  .  Glucosamine Sulfate 1000 MG CAPS, Take by mouth., Disp: , Rfl:  .  isosorbide mononitrate (IMDUR) 30 MG 24 hr tablet, TAKE 1 TABLET BY MOUTH EVERY DAY, Disp: , Rfl:  .  metFORMIN (GLUCOPHAGE) 500 MG tablet, TAKE 1 TABLET BY MOUTH DAILY., Disp: , Rfl:  .  metoprolol succinate (TOPROL-XL) 25 MG 24 hr tablet, TAKE 1 TABLET BY MOUTH EVERY DAY, Disp: , Rfl:  .  modafinil (PROVIGIL) 200 MG tablet, Take by mouth., Disp: , Rfl:  .  Multiple Vitamins-Minerals (MULTIVITAMIN & MINERAL PO), Take by mouth., Disp: , Rfl:  .  niacin (NIASPAN) 1000 MG CR tablet, Take by mouth., Disp: , Rfl:  .  nitroGLYCERIN (NITROSTAT) 0.4 MG SL tablet, Place under the tongue., Disp: , Rfl:  .  prasugrel (EFFIENT) 10 MG TABS tablet, TAKE 1 TABLET BY MOUTH EVERY DAY, Disp: , Rfl:  .  rosuvastatin (CRESTOR) 20 MG tablet, Take by mouth., Disp: , Rfl:  .  tiotropium (SPIRIVA) 18 MCG inhalation capsule, Place into inhaler and inhale., Disp: , Rfl:   Past Medical History: No past medical history on file.  Tobacco Use: History  Smoking status  . Former Smoker -- 2.00 packs/day for 22 years  . Types: Cigarettes  Smokeless tobacco  . 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         POCT Glucose      11/03/14 1524 11/06/14 1449 11/08/14 1130       POCT Blood Glucose   Pre-Exercise 100 mg/dL 100 mg/dL      Post-Exercise 85 mg/dL 85 mg/dL      Pre-Exercise #2  102 mg/dL      Post-Exercise #2  95 mg/dL  Peanut butter crackers given for after class on the way to lunch      Pre-Exercise #3   96 mg/dL     Post-Exercise #3   99 mg/dL        ADL UCSD:     ADL UCSD      10/31/14 1131 01/03/15 1130 02/16/15 1130   ADL UCSD   ADL Phase Entry Mid Entry   SOB Score total 75 69 85   Rest _0 Walk _1 Stairs _2 Bath _3 Dress _4 Shop _5 02/16/15 1631       ADL UCSD   ADL Phase Exit     SOB Score total 85     Rest  1     Walk 2     Stairs 4     Bath 3     Dress 3     Shop 5         Pulmonary Function Assessment:     Pulmonary Function Assessment - 10/31/14 1129    Pulmonary Function Tests   FVC% 56 %   FEV1% 61 %   FEV1/FVC Ratio 83.86   RV% 64 %   DLCO% 45 %   Breath   Bilateral Breath Sounds Clear   Shortness of Breath No;Yes      Exercise Target Goals:    Exercise Program Goal: Individual exercise prescription set with THRR, safety & activity barriers. Participant demonstrates ability to understand and report RPE using BORG scale, to self-measure pulse accurately, and to acknowledge the importance of the exercise prescription.  Exercise Prescription Goal: Starting with aerobic activity 30 plus minutes a day, 3 days per week for initial exercise prescription. Provide home exercise prescription and guidelines that participant acknowledges understanding prior to discharge.  Activity Barriers & Risk Stratification:     Activity Barriers & Risk Stratification - 10/31/14 1128    Activity Barriers & Risk Stratification   Activity Barriers None      6 Minute Walk:     6 Minute Walk      10/31/14 1702 01/03/15  1225 02/09/15 1159   6 Minute Walk   Phase Initial Mid Program Discharge   Distance 940 feet 880 feet 600 feet   Walk Time 6 minutes 6 minutes 5 minutes   Resting HR 73 bpm 105 bpm 102 bpm   Resting BP 128/76 mmHg 124/70 mmHg 158/88 mmHg   Max Ex. HR 131 bpm 136 bpm 109 bpm   Max Ex. BP 162/84 mmHg 182/82 mmHg 172/94 mmHg   RPE _0 Perceived Dyspnea  _1 Symptoms No No      02/09/15 1214       6 Minute Walk   Resting HR (p) --  Pulse ox on 6 liters before 6 minute walk 98%.     Perceived Dyspnea  (p) --  Post 6 min pulse ox 88% on 6 liters oxgyen        Initial Exercise Prescription:     Initial Exercise Prescription - 10/31/14 1700    Date of Initial Exercise Prescription   Date 10/31/14   Treadmill   MPH 1.4   Grade 0   Minutes 10   Bike   Level 1   Watts 30   Minutes 10   Recumbant Bike   Level 1   RPM 30   Watts 20   Minutes 10   NuStep   Level 2   Watts 50   Minutes 10   Arm Ergometer   Level 1   Watts 10   Minutes 10   Arm/Foot Ergometer   Level 1   Watts 12   Minutes 10   Recumbant Elliptical   Level 1   RPM 40   Watts 20   Minutes 10   Elliptical   Level 1   Speed 3   Minutes 1   REL-XR   Level 2   Watts 50   Minutes 10   Prescription Details   Frequency (times per week) 3   Duration Progress to 30 minutes of continuous aerobic without signs/symptoms of physical distress   Intensity   THRR REST +  30  Ratings of Perceived Exertion 11-15   Perceived Dyspnea 2-4   Resistance Training   Training Prescription Yes   Weight 1   Reps 10-12      Exercise Prescription Changes:     Exercise Prescription Changes      11/03/14 1500 11/06/14 1400 11/10/14 1300 11/13/14 1000 11/13/14 1400   Exercise Review   Progression    Yes Yes   Response to Exercise   Blood Pressure (Admit)    130/80 mmHg 130/80 mmHg   Blood Pressure (Exercise)    160/90 mmHg 160/90 mmHg   Blood Pressure (Exit)    142/84 mmHg 142/84 mmHg   Heart  Rate (Admit)     82 bpm   Heart Rate (Exercise)     118 bpm   Heart Rate (Exit)     84 bpm   Oxygen Saturation (Admit)     92 %   Oxygen Saturation (Exercise)     88 %   Oxygen Saturation (Exit)     96 %   Rating of Perceived Exertion (Exercise)     13   Perceived Dyspnea (Exercise)     13   Intensity    THRR unchanged THRR unchanged   Progression    Continue progressive overload as per policy without signs/symptoms or physical distress. Continue progressive overload as per policy without signs/symptoms or physical distress.   Resistance Training   Training Prescription   Yes  2 lbs. Yes Yes   Weight   _0 Reps    10-12 10-12   Treadmill   MPH  1.4 1.5 1.4 1.4   Grade  0  0 0   Minutes  _1 NuStep   Level _2 Watts 50   30 30   Minutes _3 Recumbant Elliptical   Level  1  1 1.5   RPM  _4 Watts     12   Minutes  _5 12/06/14 1200 12/06/14 1500 12/08/14 1400 12/11/14 1200 01/01/15 1200   Exercise Review   Progression Yes Yes  Yes Yes   Response to Exercise   Blood Pressure (Admit) 130/80 mmHg 148/80 mmHg   118/78 mmHg   Blood Pressure (Exercise) 160/90 mmHg 142/74 mmHg   140/68 mmHg   Blood Pressure (Exit) 142/84 mmHg 140/80 mmHg   122/62 mmHg   Heart Rate (Admit) 82 bpm 95 bpm   80 bpm   Heart Rate (Exercise) 118 bpm 118 bpm   108 bpm   Heart Rate (Exit) 84 bpm 95 bpm   85 bpm   Oxygen Saturation (Admit) 92 % 94 %   91 %  6l/m continuous   Oxygen Saturation (Exercise) 88 % 90 %  6l/m continuous   86 %   Oxygen Saturation (Exit) 96 % 97 %   97 %   Rating of Perceived Exertion (Exercise) _6 Perceived Dyspnea (Exercise) _7 Intensity THRR unchanged       Progression Continue progressive overload as per policy without signs/symptoms or physical distress.       Resistance Training   Training Prescription Yes    Yes   Weight 2    4   Reps 10-12    10-12   Treadmill   MPH 1.4  1.5  1.5 1.5   Grade 0  0 0 0    Minutes _0 NuStep   Level _1 Watts 30   45 50   Minutes 15    15   Recumbant Elliptical   Level _2 RPM 20   60 40   Watts 15       Minutes 10    12     01/15/15 1200 01/17/15 1500 01/22/15 1300 01/31/15 1200 01/31/15 1400   Exercise Review   Progression _3    Response to Exercise   Blood Pressure (Admit) 118/78 mmHg  118/78 mmHg 140/88 mmHg    Blood Pressure (Exercise) 140/68 mmHg  140/68 mmHg 156/80 mmHg    Blood Pressure (Exit) 122/62 mmHg  122/62 mmHg 118/80 mmHg    Heart Rate (Admit) 80 bpm  80 bpm 88 bpm    Heart Rate (Exercise) 108 bpm  108 bpm 122 bpm    Heart Rate (Exit) 85 bpm  85 bpm 98 bpm    Oxygen Saturation (Admit) 91 %  6l/m continuous  91 %  6l/m continuous 93 %    Oxygen Saturation (Exercise) 86 %  86 % 90 %    Oxygen Saturation (Exit) 97 %  97 % 94 %    Rating of Perceived Exertion (Exercise) _4 Perceived Dyspnea (Exercise) _5 Intensity  THRR unchanged      Progression  Continue progressive overload as per policy without signs/symptoms or physical distress.      Resistance Training   Training Prescription Yes Yes Yes Yes    Weight _6 Reps 10-12 10-12 10-12 10-12    Treadmill   MPH 1.5 1.5 1.5 1.8    Grade 0 0 0 0    Minutes _7 NuStep   Level _8 T5    Watts 50  50 80    Minutes _9 Recumbant Elliptical   Level _10 RPM 50  50 50    Minutes    15      02/12/15 1200 02/23/15 1500 03/27/15 0900       Exercise Review   Progression Yes  Yes     Response to Exercise   Blood Pressure (Admit)  134/83 mmHg 146/80 mmHg  Data 03/26/15     Blood Pressure (Exercise)  140/80 mmHg 150/70 mmHg     Blood Pressure (Exit)  138/70 mmHg 148/80 mmHg     Heart Rate (Admit)  101 bpm 84 bpm     Heart Rate (Exercise)  118 bpm 127 bpm     Heart Rate (Exit)  97 bpm 103 bpm     Oxygen Saturation (Admit)  91 % 97 %     Oxygen Saturation (Exercise)  89 % 88 %  8l/m C      Oxygen Saturation (Exit)  99 % 96 %     Rating of Perceived Exertion (Exercise)  13 13     Perceived Dyspnea (Exercise)  4 4     Frequency   Add 1 additional day to program exercise sessions.     Duration   Progress to 50 minutes of aerobic without signs/symptoms of physical distress  Intensity   Rest + 30     Progression   Continue progressive overload as per policy without signs/symptoms or physical distress.     Resistance Training   Training Prescription  Yes Yes     Weight  5 5     Reps  10-12 10-12     Treadmill   MPH  1.8 1.9     Grade  0 0     Minutes  15 15     NuStep   Level 7  T5 Nustep 7  T5 Nustep 7  T5     Watts 60 60 70     Minutes  15 15     Recumbant Elliptical   Level  6 7     RPM  50 60     Watts   15     Minutes  15      Home Exercise Plan   Plans to continue exercise at  --  Mr. Birdsall plans to attend another 36 sessions per MD order.         Discharge Exercise Prescription (Final Exercise Prescription Changes):     Exercise Prescription Changes - 03/27/15 0900    Exercise Review   Progression Yes   Response to Exercise   Blood Pressure (Admit) 146/80 mmHg  Data 03/26/15   Blood Pressure (Exercise) 150/70 mmHg   Blood Pressure (Exit) 148/80 mmHg   Heart Rate (Admit) 84 bpm   Heart Rate (Exercise) 127 bpm   Heart Rate (Exit) 103 bpm   Oxygen Saturation (Admit) 97 %   Oxygen Saturation (Exercise) 88 %  8l/m C   Oxygen Saturation (Exit) 96 %   Rating of Perceived Exertion (Exercise) 13   Perceived Dyspnea (Exercise) 4   Frequency Add 1 additional day to program exercise sessions.   Duration Progress to 50 minutes of aerobic without signs/symptoms of physical distress   Intensity Rest + 30   Progression Continue progressive overload as per policy without signs/symptoms or physical distress.   Resistance Training   Training Prescription Yes   Weight 5   Reps 10-12   Treadmill   MPH 1.9   Grade 0   Minutes 15   NuStep   Level 7  T5    Watts 70   Minutes 15   Recumbant Elliptical   Level 7   RPM 60   Watts 15       Nutrition:  Target Goals: Understanding of nutrition guidelines, daily intake of sodium <1571m, cholesterol <2052m calories 30% from fat and 7% or less from saturated fats, daily to have 5 or more servings of fruits and vegetables.  Biometrics:     Pre Biometrics - 10/31/14 1705    Pre Biometrics   Height _0  (1.88 m)   Weight (!) 342 lb 9.6 oz (155.402 kg)   Waist Circumference 55 inches   Hip Circumference 56 inches   Waist to Hip Ratio 0.98 %   BMI (Calculated) 44.1       Nutrition Therapy Plan and Nutrition Goals:     Nutrition Therapy & Goals - 12/21/14 1200    Nutrition Therapy   Diet Instructed patient on a 2000 calorie vegan meal plan, DASH diet principles.  He states that he is following Dr. EsMaurine MinisterReverse Heart Disease" diet which is vegan and eliminates all oils but admits he is having difficulty following it. Discussed the benefits of a vegan diet but stresssed that have to be careful to include  adequate protein.   Fiber 20 grams   Whole Grain Foods 4 servings   Protein 8 ounces/day   Saturated Fats 13 max. grams   Fruits and Vegetables 5 servings/day   Personal Nutrition Goals   Personal Goal #1 To space meals 4-6 hours apart.   Personal Goal #2 Include a protein source at every meal. Refer to list given on meat alternatives. Goal of 75 gms protein/day.   Personal Goal #3 Use my Fitness Pal to record food/beverage intake   Personal Goal #4 In addtions to strategies discussed to help with weight loss, refer to "National Weight Loss Registry" for numerous other healthy strategies that participants have used to help lose and maintain weight loss.        Nutrition Discharge: Rate Your Plate Scores:     Rate Your Plate - 24/26/83 4196    Rate Your Plate Scores   Pre Score 55   Pre Score % 79 %  Due to vegan diet, many of the questions were  N/A       Psychosocial: Target Goals: Acknowledge presence or absence of depression, maximize coping skills, provide positive support system. Participant is able to verbalize types and ability to use techniques and skills needed for reducing stress and depression.  Initial Review & Psychosocial Screening:     Initial Psych Review & Screening - 10/31/14 0900    Initial Review   Current issues with Current Depression;Current Sleep Concerns;Current Stress Concerns   Source of Stress Concerns Chronic Illness;Unable to participate in former interests or hobbies   [place]? Yes   Barriers   Psychosocial barriers to participate in program There are no identifiable barriers or psychosocial needs.;The patient should benefit from training in stress management and relaxation.   Screening Interventions   Interventions Encouraged to exercise;Program counselor consult      Quality of Life Scores:     Quality of Life - 02/19/15 1130    Quality of Life Scores   Health/Function Post 9.63 %   Socioeconomic Post 14.75 %   Psych/Spiritual Post 12.64 %   Family Post 18.6 %   GLOBAL Post 12.69 %      PHQ-9:     Recent Review Flowsheet Data    Depression screen [facility] 2/9 02/19/2015 02/12/2015 10/31/2014   Decreased Interest _0 Down, Depressed, Hopeless _1 PHQ - 2 Score _2 Altered sleeping 0 0 0   Tired, decreased energy _3 Change in appetite _4 Feeling bad or failure about yourself  _5 Trouble concentrating _6 Moving slowly or fidgety/restless _7 Suicidal thoughts _8 PHQ-9 Score _9 Difficult doing work/chores Somewhat difficult - Somewhat difficult      Psychosocial Evaluation and Intervention:     Psychosocial Evaluation - 11/08/14 1627    Psychosocial Evaluation & Interventions   Interventions Stress management education;Encouraged to exercise with the program and follow exercise prescription;Relaxation  education   Comments Counselor met with Mr. Klus today for initial psychosocial evaluation.  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  He lives with a spouse of 72 years and several friends whom he refers to as part of his support system.  Mr. Jenetta Downer has COPD and several other health issues that  are impacting his recovery from pneumonia, including Narcolepsy, and Sleep Apnea, with Mr. Jenetta Downer reporting he doesn't use his CPAP to sleep as it is not user friendly.  Counselor reviewed the PHQ-9 with a score of "17" with Mr. Jenetta Downer confirming all his answers are current and accurate.  When assessed for depression, he denies a history and states any current symptoms are "situational" due to his health issues primarily.  Mr. Jenetta Downer also reports current stressors are health, financial, conflict with his daughter, a close cousin with pancreatic cancer and his inability to do the things that used to bring him pleasure, such as playing the guitar/banjo with friends and playing his harmonica.  Mr. Colman Cater goals for this program are to breathe better, lose some weight, increase his physical strength and be stronger and more able to walk/go shopping without being "tied to this big oxygen tank."  Counselor discussed Mr. Jenetta Downer mentioning to his Dr. for a medication evaluation for his depressive symptoms and possibly seeing a therpist for support currently.  He preferred to wait to see if this program helps him feel better/stronger first.  Counselor will re-assess over the next few weeks and make further recommendations at that time.      Continued Psychosocial Services Needed Yes  Mr. O will benefit from all the educational components of this program, and participated in Relaxation education on this date.  He will benefit from meeting with a dietician, and attending the depression and stress management educational components.      Psychosocial Re-Evaluation:  Education: Education Goals: Education classes will be  provided on a weekly basis, covering required topics. Participant will state understanding/return demonstration of topics presented.  Learning Barriers/Preferences:     Learning Barriers/Preferences - 10/31/14 1128    Learning Barriers/Preferences   Learning Barriers None   Learning Preferences Group Instruction;Individual Instruction;Pictoral;Written Material;Video;Verbal Instruction;Skilled Demonstration      Education Topics: Initial Evaluation Education: - Verbal, written and demonstration of respiratory meds, RPE/PD scales, oximetry and breathing techniques. Instruction on use of nebulizers and MDIs: cleaning and proper use, rinsing mouth with steroid doses and importance of monitoring MDI activations.          Pulmonary Rehab from 03/14/2015 in [place]   Date  10/31/14   Educator  LB   Instruction Review Code  2- meets goals/outcomes      General Nutrition Guidelines/Fats and Fiber: -Group instruction provided by verbal, written material, models and posters to present the general guidelines for heart healthy nutrition. Gives an explanation and review of dietary fats and fiber.      Pulmonary Rehab from 03/14/2015 in [place]   Date  02/05/15   Educator  C. Joneen Caraway, RD   Instruction Review Code  2- meets goals/outcomes      Controlling Sodium/Reading Food Labels: -Group verbal and written material supporting the discussion of sodium use in heart healthy nutrition. Review and explanation with models, verbal and written materials for utilization of the food label.      Pulmonary Rehab from 03/14/2015 in [place]   Date  02/12/15   Educator  C. Rusell, RD   Instruction Review Code  2- meets goals/outcomes      Exercise Physiology & Risk Factors: - Group verbal and written instruction with models to review the exercise physiology of the cardiovascular  system and associated critical values. Details cardiovascular disease risk factors and the goals associated with each risk  factor.   Aerobic Exercise & Resistance Training: - Gives group verbal and written discussion on the health impact of inactivity. On the components of aerobic and resistive training programs and the benefits of this training and how to safely progress through these programs.      Pulmonary Rehab from 03/14/2015 in [place]   Date  12/27/14   Educator  SW   Instruction Review Code  2- meets goals/outcomes      Flexibility, Balance, General Exercise Guidelines: - Provides group verbal and written instruction on the benefits of flexibility and balance training programs. Provides general exercise guidelines with specific guidelines to those with heart or lung disease. Demonstration and skill practice provided.      Pulmonary Rehab from 03/14/2015 in [place]   Date  01/24/15   Educator  SW   Instruction Review Code  2- meets goals/outcomes      Stress Management: - Provides group verbal and written instruction about the health risks of elevated stress, cause of high stress, and healthy ways to reduce stress.      Pulmonary Rehab from 03/14/2015 in [place]   Date  01/17/15   Educator  [facility]   Instruction Review Code  2- meets goals/outcomes      Depression: - Provides group verbal and written instruction on the correlation between heart/lung disease and depressed mood, treatment options, and the stigmas associated with seeking treatment.      Pulmonary Rehab from 03/14/2015 in [place]   Date  03/14/15   Educator  Berle Mull, MSW   Instruction Review Code  2- meets goals/outcomes      Exercise & Equipment Safety: - Individual verbal instruction and demonstration of equipment use and safety with use of the  equipment.      Pulmonary Rehab from 03/14/2015 in [place]   Date  01/15/15   Educator  L. Owens Shark, RRT [facility] [place]]   Instruction Review Code  2- meets goals/outcomes      Infection Prevention: - Provides verbal and written material to individual with discussion of infection control including proper hand washing and proper equipment cleaning during exercise session.      Pulmonary Rehab from 03/14/2015 in [place]   Date  11/03/14   Educator  Jacqulyn Ducking   Instruction Review Code  2- meets goals/outcomes      Falls Prevention: - Provides verbal and written material to individual with discussion of falls prevention and safety.   Diabetes: - Individual verbal and written instruction to review signs/symptoms of diabetes, desired ranges of glucose level fasting, after meals and with exercise. Advice that pre and post exercise glucose checks will be done for 3 sessions at entry of program.      Pulmonary Rehab from 03/14/2015 in [place]   Date  02/23/15 [facility] your numbers]   Educator  C. Brittian Renaldo   Instruction Review Code  2- meets goals/outcomes      Chronic Lung Diseases: - Group verbal and written instruction to review new updates, new respiratory medications, new advancements in procedures and treatments. Provide informative websites and "800" numbers of self-education.      Pulmonary Rehab from 03/14/2015 in [place]   Date  01/29/15   Educator  C. EnterkinRN   Instruction Review Code  2- meets goals/outcomes  Lung Procedures: - Group verbal and written instruction to describe testing methods done to diagnose lung disease. Review the outcome of test results. Describe the treatment choices: Pulmonary Function Tests, ABGs and oximetry.      Pulmonary Rehab from 03/14/2015 in [place]   Date  01/26/15   Educator  [place]   Instruction Review Code  2- meets goals/outcomes      Energy Conservation: - Provide group verbal and written instruction for methods to conserve energy, plan and organize activities. Instruct on pacing techniques, use of adaptive equipment and posture/positioning to relieve shortness of breath.      Pulmonary Rehab from 03/14/2015 in [place]   Date  02/07/15   Educator  SW   Instruction Review Code  2- meets goals/outcomes      Triggers: - Group verbal and written instruction to review types of environmental controls: home humidity, furnaces, filters, dust mite/pet prevention, HEPA vacuums. To discuss weather changes, air quality and the benefits of nasal washing.      Pulmonary Rehab from 03/14/2015 in [place]   Date  01/01/15   Educator  LB   Instruction Review Code  2- meets goals/outcomes      Exacerbations: - Group verbal and written instruction to provide: warning signs, infection symptoms, calling MD promptly, preventive modes, and value of vaccinations. Review: effective airway clearance, coughing and/or vibration techniques. Create an Sports administrator.   Oxygen: - Individual and group verbal and written instruction on oxygen therapy. Includes supplement oxygen, available portable oxygen systems, continuous and intermittent flow rates, oxygen safety, concentrators, and Medicare reimbursement for oxygen.      Pulmonary Rehab from 03/14/2015 in [place]   Date  10/31/14   Educator  LB   Instruction Review Code  2- meets goals/outcomes      Respiratory Medications: - Group verbal and written instruction to review medications for lung disease. Drug class, frequency, complications, importance of spacers, rinsing mouth after steroid MDI's, and proper cleaning methods for nebulizers.      Pulmonary Rehab from 03/14/2015 in  [place]   Date  10/31/14   Educator  LB   Instruction Review Code  2- meets goals/outcomes      AED/CPR: - Group verbal and written instruction with the use of models to demonstrate the basic use of the AED with the basic ABC's of resuscitation.      Pulmonary Rehab from 03/14/2015 in [place]   Date  02/02/15   Educator  CE   Instruction Review Code  2- meets goals/outcomes      Breathing Retraining: - Provides individuals verbal and written instruction on purpose, frequency, and proper technique of diaphragmatic breathing and pursed-lipped breathing. Applies individual practice skills.      Pulmonary Rehab from 03/14/2015 in [place]   Date  10/31/14   Educator  LB   Instruction Review Code  R- Review/reinforce      Anatomy and Physiology of the Lungs: - Group verbal and written instruction with the use of models to provide basic lung anatomy and physiology related to function, structure and complications of lung disease.   Heart Failure: - Group verbal and written instruction on the basics of heart failure: signs/symptoms, treatments, explanation of ejection fraction, enlarged heart and cardiomyopathy.      Pulmonary Rehab from 03/14/2015  in [place]   Date  01/12/15   Educator  Frederich Cha, RRT   Instruction Review Code  2- meets goals/outcomes      Sleep Apnea: - Individual verbal and written instruction to review Obstructive Sleep Apnea. Review of risk factors, methods for diagnosing and types of masks and machines for OSA.   Anxiety: - Provides group, verbal and written instruction on the correlation between heart/lung disease and anxiety, treatment options, and management of anxiety.      Pulmonary Rehab from 03/14/2015 in [place]   Date  02/14/15   Educator  Berle Mull, MSW   Instruction Review Code  2- Meets goals/outcomes      Relaxation: - Provides group, verbal and written instruction about the benefits of relaxation for patients with heart/lung disease. Also provides patients with examples of relaxation techniques.      Pulmonary Rehab from 03/14/2015 in [place]   Date  11/08/14   Educator  Lucianne Lei LCSW   Instruction Review Code  2- Meets goals/outcomes      Knowledge Questionnaire Score:     Knowledge Questionnaire Score - 02/20/15 1018    Knowledge Questionnaire Score   Post Score -1      Personal Goals and Risk Factors at Admission:     Personal Goals and Risk Factors at Admission - 03/02/15 1130    Personal Goals and Risk Factors on Admission    Weight Management Yes      Personal Goals and Risk Factors Review:      Goals and Risk Factor Review      11/15/14 1130 11/29/14 1130 12/06/14 1130 12/20/14 1248 12/20/14 1548   Weight Management   Goals Progress/Improvement seen Yes       Comments Mr Swatek has filled out the dietitian's questionnaire; it was faxed to her; and he will meet with her after the holiday.       Increase Aerobic Exercise and Physical Activity   Goals Progress/Improvement seen    Yes     Comments   Mr Andonian has had increases with his exercise goals on the TM, T5, and TM.     Improve shortness of breath with ADL's   Goals Progress/Improvement seen    Yes     Comments   In working with Mr Duzan on his oxygen and O2Sat's, he has been able to increase his exercise goals which help improve his shortness of breath, especially with the correct oxygen flow rates.     Breathing Techniques   Goals Progress/Improvement seen    Yes  Yes   Comments   Mr Mall works very hard to incorporate PLB with his exercise.  Encouraging Mr Varden to use PLB with his exercise in [place]. With his low O2Sat's on the TM today, he improved his readings with the PLB. He states he really  has to make himself breath deep. Encouraged him to use PLB  when walking into [place] and at his apartment in the long hallway to elevator.   Increase knowledge of respiratory medications   Goals Progress/Improvement seen   Yes      Comments  Worked with Mr Gazda and portable oxygen; I had him use the Eclipse which is a Systems developer and used in our gym; He was not able to maintain O2Sat's on the 6l/m intermittent and we switched him to the 6l/m gas portable with continuous flow.  I also  gave him a copy of a Pulmonary PPaper which had a great article on portable concentrators.      Other Goal   Goals Progress/Improvement seen     Yes    Comments    Discussed with Artem about a piece of exercise equipment he has avaiable for home use and the benefits of it.      01/01/15 1234 01/08/15 1547 01/15/15 1130 01/19/15 1130 01/24/15 1130   Weight Management   Goals Progress/Improvement seen Yes  No     Comments ongoing, would like to lose 150 lbs.  Asked  Mr Radu if he had an appointment with the dietitian yet; he said he did meet with her but continues to struggle with his diet - large portion sizes and snacking late at night; his weight is averaging  340lbs.  He has been on a [employer]  since 2006 and has helped his cardiac health.     Increase Aerobic Exercise and Physical Activity   Goals Progress/Improvement seen  Yes   Yes    Comments continue to make gains on the treadmill   Mr Figiel states his endurance is increasing. 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 He still needs to find a oxygen portable concentrator that will work for this that would cover the time involved.    Improve shortness of breath with ADL's   Goals Progress/Improvement seen  Yes Yes   Yes   Comments  Mr Delbene is managing his shortness of breath in [facility] by managing  his oxygen and adjusting the flow rate to 6l/m with exercise and with some activites at home activities   Mr  Mendolia brought his harmonica in to class and played it for the class. We talked about the benefits of playing the harmonica on inspiratory and expiratory muscle strength and PLB technique.   Breathing Techniques   Goals Progress/Improvement seen  Yes       Comments wants to improve, also incorporate meditation       Increase knowledge of respiratory medications   Goals Progress/Improvement seen   Yes      Comments  Mr Dai has a good understanding of his Spiriva; he states that he is not sure "it works", but explained the importance of Spiriva, especially with activity.      Stress   Goal To meet with psychosocial counselor for stress and relaxation information and guidance. To state understanding of performing relaxation techniques and or identifying personal stressors.         01/24/15 1330 01/29/15 1550 03/19/15 1130 03/21/15 1130 03/26/15 1130   Weight Management   Goals Progress/Improvement seen   No     Comments   Mr Barnick has maintained his weight at 340-343. He continues with his [place] for his heart disease.     Understand more about Heart/Pulmonary Disease   Goals Progress/Improvement seen  Yes  Yes Yes    Comments Has enjoyed all the education classes, has trouble retaining information or recalling information, but he still enjoys the sessions and feels like they are helping him better manage his disease.   Mr Suppes brought in his PFT and CT results from recent testing. We discussed his Fev1 and how it related to COPD. I plan to continue reviewing his other results. Mr Ohms is very open for  new knowledge about lung disease. I continued to educate Mr Kilner on his recent PFT .  I explained the FEV1, RV, and DLCO. He is being referred to a pulmonologist which he is very positive about for answers about his lung condition.    Improve shortness of breath with ADL's   Goals Progress/Improvement seen  Yes    Yes   Comments Has not seen much improvement in SOB since last month, but is still  happy with his progress in the program.     Mr Guin has good days and bad days with his breathing. He is adjusting his oxygen with exercise and pacing himself with activites. He is looking forward to consulting with a pulmonologist for more detail about his shortness of breath and breathing.   Breathing Techniques   Goals Progress/Improvement seen  Yes    Yes   Comments Uses PLB when his O2 saturation is low or when he feels short of breath. He especially utilizes this during class while exercising, but also uses it around the house and when he is out.     Mr Larmon uses PLB, especially on the TM and has maintined acceptable O2Sat with this exercise.   Increase knowledge of respiratory medications   Goals Progress/Improvement seen   Yes   Yes   Comments  Discussed with Mr Kubisiak about use an oral device with his sleep apnea, since he can not tolerate the CPAP masks, he was open to it and gave him a list of dentists in the area who are certiified in the oral devices.   Mr Ng is taking his Spiriva daily. So far, the "E" cylinders are his portal system for oxygen.   Diabetes   Goal     --  No problems with his diabete in [facility]   Hypertension   Goal     --  Maintaining acceptable BP at rest wnd with exercise in [facility].   Abnormal Lipids   Goal     --  No problems with cholesterol reading in [facility].   Stress   Goal     --  Mr Somers has met with the counselor in [place]. I have educated him on his  pulmonary test results and on portable concentrators.     04/02/15 0853           Increase Aerobic Exercise and Physical Activity   Goals Progress/Improvement seen  No       Comments Has not seen much improvement, however, has been remaining stable which is key for him at the juncture.          Personal Goals Discharge (Final Personal Goals and Risk Factors Review):      Goals and Risk Factor Review - 04/02/15 0853    Increase Aerobic Exercise and Physical Activity   Goals  Progress/Improvement seen  No   Comments Has not seen much improvement, however, has been remaining stable which is key for him at the juncture.      Comments: 30 day note

## 2015-04-04 ENCOUNTER — Encounter: Payer: Medicare Other | Admitting: *Deleted

## 2015-04-04 DIAGNOSIS — G4733 Obstructive sleep apnea (adult) (pediatric): Secondary | ICD-10-CM

## 2015-04-04 DIAGNOSIS — J449 Chronic obstructive pulmonary disease, unspecified: Secondary | ICD-10-CM

## 2015-04-04 DIAGNOSIS — G4730 Sleep apnea, unspecified: Secondary | ICD-10-CM

## 2015-04-04 DIAGNOSIS — J42 Unspecified chronic bronchitis: Secondary | ICD-10-CM | POA: Diagnosis not present

## 2015-04-04 NOTE — Progress Notes (Signed)
Daily Session Note  Patient Details  Name: Michaela Shankel MRN: [id number] Date of Birth: [birth year] Referring Provider:  [name], MD  Encounter Date: 04/04/2015  Check In:     Session Check In - 04/04/15 1219    Check-In   Staff Present Carson Myrtle BS, RRT, Respiratory Therapist;Unita Detamore Dillard Essex MS, ACSM CEP Exercise Physiologist;Steven Way BS, ACSM EP-C, Exercise Physiologist   ER physicians immediately available to respond to emergencies [facility] immediately available ER MD   Physician(s) Burlene Arnt and Joni Fears   Medication changes reported     No   Fall or balance concerns reported    No   Warm-up and Cool-down Performed on first and last piece of equipment   VAD Patient? No   Pain Assessment   Currently in Pain? No/denies   Multiple Pain Sites No         Goals Met:  Proper associated with RPD/PD & O2 Sat Independence with exercise equipment Using PLB without cueing & demonstrates good technique Exercise tolerated well Strength training completed today  Goals Unmet:  Not Applicable  Goals Comments: Did well with all exercise activity today   Dr. Emily Filbert is Medical Director for [place] and [facility].

## 2015-04-09 ENCOUNTER — Encounter: Payer: Medicare Other | Admitting: *Deleted

## 2015-04-09 DIAGNOSIS — J42 Unspecified chronic bronchitis: Secondary | ICD-10-CM | POA: Diagnosis not present

## 2015-04-09 DIAGNOSIS — G4730 Sleep apnea, unspecified: Secondary | ICD-10-CM

## 2015-04-09 NOTE — Progress Notes (Signed)
Daily Session Note  Patient Details  Name: Miguel Palmer MRN: [id number] Date of Birth: [birth year] Referring Provider:  [name], MD  Encounter Date: 04/09/2015  Check In:     Session Check In - 04/09/15 1221    Check-In   Staff Present Carson Myrtle BS, RRT, Respiratory Therapist;Graylee Arutyunyan RN, BSN;Steven Way BS, ACSM EP-C, Exercise Physiologist   ER physicians immediately available to respond to emergencies [facility] immediately available ER MD   Physician(s) Dr/ Marcelene [place] Dr. Annabell Sabal   Medication changes reported     No   Fall or balance concerns reported    No   Warm-up and Cool-down Performed on first and last piece of equipment   Pain Assessment   Currently in Pain? No/denies         Goals Met:  Proper associated with RPD/PD & O2 Sat Exercise tolerated well  Goals Unmet:  Not Applicable  Goals Comments: Mr Rise has a new appointment at [facility] for a second opinion on his lung condition. The appointment is 05/24/2015.   Dr. Emily Filbert is Medical Director for [place] and [facility].

## 2015-04-11 ENCOUNTER — Encounter: Payer: Medicare Other | Admitting: *Deleted

## 2015-04-11 DIAGNOSIS — J449 Chronic obstructive pulmonary disease, unspecified: Secondary | ICD-10-CM

## 2015-04-11 DIAGNOSIS — J42 Unspecified chronic bronchitis: Secondary | ICD-10-CM | POA: Diagnosis not present

## 2015-04-11 DIAGNOSIS — G4730 Sleep apnea, unspecified: Secondary | ICD-10-CM

## 2015-04-11 DIAGNOSIS — G4733 Obstructive sleep apnea (adult) (pediatric): Secondary | ICD-10-CM

## 2015-04-11 NOTE — Progress Notes (Signed)
Daily Session Note  Patient Details  Name: Keneth Borg MRN: [id number] Date of Birth: March 29, 1941 Referring Provider:  [name], MD  Encounter Date: 04/11/2015  Check In:     Session Check In - 04/11/15 1237    Check-In   Staff Present Carson Myrtle BS, RRT, Respiratory Therapist;Mischa Pollard Dillard Essex MS, ACSM CEP Exercise Physiologist;Steven Way BS, ACSM EP-C, Exercise Physiologist   ER physicians immediately available to respond to emergencies [facility] immediately available ER MD   Physician(s) Thomasene Lot and Reita Cliche   Medication changes reported     No   Fall or balance concerns reported    No   Warm-up and Cool-down Performed on first and last piece of equipment   VAD Patient? No   Pain Assessment   Currently in Pain? No/denies   Multiple Pain Sites No         Goals Met:  Proper associated with RPD/PD & O2 Sat Independence with exercise equipment Using PLB without cueing & demonstrates good technique Exercise tolerated well Strength training completed today  Goals Unmet:  Not Applicable  Goals Comments: Patient completed exercise prescription and all exercise goals during rehab session. The exercise was tolerated well and the patient is progressing in the program.    Dr. Emily Filbert is Medical Director for [place] and [facility].

## 2015-04-17 ENCOUNTER — Encounter: Payer: Self-pay | Admitting: Respiratory Therapy

## 2015-04-18 ENCOUNTER — Encounter: Payer: Medicare Other | Admitting: *Deleted

## 2015-04-18 DIAGNOSIS — J42 Unspecified chronic bronchitis: Secondary | ICD-10-CM | POA: Diagnosis not present

## 2015-04-18 DIAGNOSIS — G4733 Obstructive sleep apnea (adult) (pediatric): Secondary | ICD-10-CM

## 2015-04-18 NOTE — Progress Notes (Signed)
Daily Session Note  Patient Details  Name: Miguel Palmer MRN: [id number] Date of Birth: 1940/06/26 Referring Provider:  [name], MD  Encounter Date: 04/18/2015  Check In:     Session Check In - 04/18/15 1246    Check-In   Staff Present Candiss Norse MS, ACSM CEP Exercise Physiologist;Laureen Janell Quiet, RRT, Respiratory Therapist;Steven Way BS, ACSM EP-C, Exercise Physiologist   ER physicians immediately available to respond to emergencies [facility] immediately available ER MD   Physician(s) Burlene Arnt and Edd Fabian   Medication changes reported     No   Fall or balance concerns reported    No   Warm-up and Cool-down Performed on first and last piece of equipment   VAD Patient? No   Pain Assessment   Currently in Pain? No/denies   Multiple Pain Sites No         Goals Met:  Proper associated with RPD/PD & O2 Sat Independence with exercise equipment Using PLB without cueing & demonstrates good technique Exercise tolerated well Strength training completed today  Goals Unmet:  Not Applicable  Goals Comments: Patient completed exercise prescription and all exercise goals during rehab session. The exercise was tolerated well and the patient is progressing in the program.   Dr. Emily Filbert is Medical Director for [place] and [facility].

## 2015-04-20 ENCOUNTER — Encounter: Payer: Medicare Other | Admitting: *Deleted

## 2015-04-20 DIAGNOSIS — J42 Unspecified chronic bronchitis: Secondary | ICD-10-CM | POA: Diagnosis not present

## 2015-04-20 DIAGNOSIS — G4733 Obstructive sleep apnea (adult) (pediatric): Secondary | ICD-10-CM

## 2015-04-20 NOTE — Progress Notes (Signed)
Daily Session Note  Patient Details  Name: Miguel Palmer MRN: [id number] Date of Birth: [birth year] Referring Provider:  [name], MD  Encounter Date: 04/20/2015  Check In:     Session Check In - 04/20/15 1206    Check-In   Staff Present Frederich Cha RRT, RCP Respiratory Therapist;Keaisha Sublette Dillard Essex MS, ACSM CEP Exercise Physiologist;Carroll Enterkin RN, BSN   ER physicians immediately available to respond to emergencies [facility] immediately available ER MD   Physician(s) Archie Balboa and Cinda Quest   Medication changes reported     No   Fall or balance concerns reported    No   Warm-up and Cool-down Performed on first and last piece of equipment   VAD Patient? No   Pain Assessment   Currently in Pain? No/denies   Multiple Pain Sites No         Goals Met:  Proper associated with RPD/PD & O2 Sat Independence with exercise equipment Using PLB without cueing & demonstrates good technique Exercise tolerated well Strength training completed today  Goals Unmet:  Not Applicable  Goals Comments: Patient completed exercise prescription and all exercise goals during rehab session. The exercise was tolerated well and the patient is progressing in the program.    Dr. Emily Filbert is Medical Director for [place] and [facility].

## 2015-04-23 DIAGNOSIS — J42 Unspecified chronic bronchitis: Secondary | ICD-10-CM | POA: Diagnosis not present

## 2015-04-23 DIAGNOSIS — J449 Chronic obstructive pulmonary disease, unspecified: Secondary | ICD-10-CM

## 2015-04-23 NOTE — Progress Notes (Signed)
Daily Session Note  Patient Details  Name: Miguel Palmer MRN: [id number] Date of Birth: Jan 01, 1941 Referring Provider:  [name], MD  Encounter Date: 04/23/2015  Check In:     Session Check In - 04/23/15 1252    Check-In   Staff Present Lestine Box BS, ACSM EP-C, Exercise Physiologist;Carroll Enterkin RN, BSN;Laureen Janell Quiet, RRT, Respiratory Therapist   ER physicians immediately available to respond to emergencies [facility] immediately available ER MD   Physician(s) Cinda Quest and goodman   Medication changes reported     No   Fall or balance concerns reported    No   Warm-up and Cool-down Performed on first and last piece of equipment   VAD Patient? No   Pain Assessment   Currently in Pain? No/denies           Exercise Prescription Changes - 04/23/15 1100    Exercise Review   Progression Yes   Response to Exercise   Blood Pressure (Admit) 140/70 mmHg  Data 03/26/15   Blood Pressure (Exercise) 158/74 mmHg   Blood Pressure (Exit) 142/80 mmHg   Heart Rate (Admit) 84 bpm   Heart Rate (Exercise) 124 bpm   Heart Rate (Exit) 93 bpm   Oxygen Saturation (Admit) 92 %   Oxygen Saturation (Exercise) 93 %  8l/m C   Oxygen Saturation (Exit) 92 %   Rating of Perceived Exertion (Exercise) 13   Perceived Dyspnea (Exercise) 4   Symptoms none   Frequency Add 1 additional day to program exercise sessions.   Duration Progress to 50 minutes of aerobic without signs/symptoms of physical distress   Intensity Rest + 30   Progression Continue progressive overload as per policy without signs/symptoms or physical distress.   Resistance Training   Training Prescription Yes   Weight 5   Reps 10-12   Treadmill   MPH 1.9   Grade 0   Minutes 15   NuStep   Level 7  T5   Watts 70   Minutes 15   Recumbant Elliptical   Level 7   RPM 60   Watts 15      Goals Met:  Proper associated with RPD/PD & O2 Sat Exercise tolerated well No report of cardiac concerns or  symptoms Strength training completed today  Goals Unmet:  Not Applicable  Goals Comments: Patient completed exercise prescription and all exercise goals during rehab sessions.The exercise was tolerated well, and the patient is progressing in the program.    Dr. Emily Filbert is Medical Director for [place] and [facility].

## 2015-04-25 ENCOUNTER — Encounter: Payer: Medicare Other | Attending: Family Medicine

## 2015-04-25 DIAGNOSIS — J42 Unspecified chronic bronchitis: Secondary | ICD-10-CM | POA: Insufficient documentation

## 2015-04-25 DIAGNOSIS — J449 Chronic obstructive pulmonary disease, unspecified: Secondary | ICD-10-CM | POA: Insufficient documentation

## 2015-04-30 DIAGNOSIS — J449 Chronic obstructive pulmonary disease, unspecified: Secondary | ICD-10-CM | POA: Diagnosis not present

## 2015-04-30 DIAGNOSIS — J42 Unspecified chronic bronchitis: Secondary | ICD-10-CM | POA: Diagnosis present

## 2015-04-30 DIAGNOSIS — G4733 Obstructive sleep apnea (adult) (pediatric): Secondary | ICD-10-CM

## 2015-04-30 NOTE — Progress Notes (Signed)
Daily Session Note  Patient Details  Name: Miguel Palmer MRN: [id number] Date of Birth: 11/13/1940 Referring Provider:  [name], MD  Encounter Date: 04/30/2015  Check In:     Session Check In - 04/30/15 1417    Check-In   Staff Present Lestine Box BS, ACSM EP-C, Exercise Physiologist;Carroll Enterkin RN, BSN;Laureen Janell Quiet, RRT, Respiratory Therapist   ER physicians immediately available to respond to emergencies [facility] immediately available ER MD   Medication changes reported     No   Fall or balance concerns reported    No   Warm-up and Cool-down Performed on first and last piece of equipment   VAD Patient? No   Pain Assessment   Currently in Pain? No/denies         Goals Met:  Proper associated with RPD/PD & O2 Sat Exercise tolerated well No report of cardiac concerns or symptoms Strength training completed today  Goals Unmet:  Not Applicable  Goals Comments:    Dr. Emily Filbert is Medical Director for [place] and [facility].

## 2015-05-01 NOTE — Progress Notes (Signed)
Pulmonary Individual Treatment Plan  Patient Details  Name: Miguel Palmer MRN: [id number] Date of Birth: 1941/05/15 Referring Provider:  [name], MD  Initial Encounter Date: 03/05/2015  Visit Diagnosis: OSA (obstructive sleep apnea)  Patient's Home Medications on Admission:  Current outpatient prescriptions:    aspirin 81 MG tablet, Take 81 mg by mouth every other day., Disp: , Rfl:    co-enzyme Q-10 30 MG capsule, Take 30 mg by mouth 2 (two) times daily., Disp: , Rfl:    dextroamphetamine (DEXEDRINE SPANSULE) 15 MG 24 hr capsule, Take 15 mg by mouth daily., Disp: , Rfl:    gabapentin (NEURONTIN) 400 MG capsule, Take by mouth., Disp: , Rfl:    Ginkgo Biloba 60 MG TABS, Take by mouth., Disp: , Rfl:    Glucosamine Sulfate 1000 MG CAPS, Take by mouth., Disp: , Rfl:    isosorbide mononitrate (IMDUR) 30 MG 24 hr tablet, TAKE 1 TABLET BY MOUTH EVERY DAY, Disp: , Rfl:    metFORMIN (GLUCOPHAGE) 500 MG tablet, TAKE 1 TABLET BY MOUTH DAILY., Disp: , Rfl:    metoprolol succinate (TOPROL-XL) 25 MG 24 hr tablet, TAKE 1 TABLET BY MOUTH EVERY DAY, Disp: , Rfl:    modafinil (PROVIGIL) 200 MG tablet, Take by mouth., Disp: , Rfl:    Multiple Vitamins-Minerals (MULTIVITAMIN & MINERAL PO), Take by mouth., Disp: , Rfl:    niacin (NIASPAN) 1000 MG CR tablet, Take by mouth., Disp: , Rfl:    nitroGLYCERIN (NITROSTAT) 0.4 MG SL tablet, Place under the tongue., Disp: , Rfl:    prasugrel (EFFIENT) 10 MG TABS tablet, TAKE 1 TABLET BY MOUTH EVERY DAY, Disp: , Rfl:    rosuvastatin (CRESTOR) 20 MG tablet, Take by mouth., Disp: , Rfl:    tiotropium (SPIRIVA) 18 MCG inhalation capsule, Place into inhaler and inhale., Disp: , Rfl:   Past Medical History: No past medical history on file.  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         POCT Glucose      11/03/14 1524 11/06/14 1449 11/08/14 1130       POCT Blood Glucose   Pre-Exercise 100 mg/dL 100 mg/dL      Post-Exercise 85 mg/dL 85 mg/dL      Pre-Exercise #2  102 mg/dL      Post-Exercise #2  95 mg/dL  Peanut butter crackers given for after class on the way to lunch      Pre-Exercise #3   96 mg/dL     Post-Exercise #3   99 mg/dL        ADL UCSD:     ADL UCSD      01/03/15 1130 02/16/15 1130 02/16/15 1631   ADL UCSD   ADL Phase Mid Entry Exit   SOB Score total 69 85 85   Rest _0 Walk _1 Stairs _2 Bath _3 Dress _4 Shop _5 Pulmonary Function Assessment:   Exercise Target Goals:    Exercise Program Goal: Individual exercise prescription set with THRR, safety & activity barriers. Participant demonstrates ability to understand and report RPE using BORG  scale, to self-measure pulse accurately, and to acknowledge the importance of the exercise prescription.  Exercise Prescription Goal: Starting with aerobic activity 30 plus minutes a day, 3 days per week for initial exercise prescription. Provide home exercise prescription and guidelines that participant acknowledges understanding prior to discharge.  Activity Barriers & Risk Stratification:   6 Minute Walk:     6 Minute Walk      01/03/15 1225 02/09/15 1159 02/09/15 1214   6 Minute Walk   Phase Mid Program Discharge    Distance 880 feet 600 feet    Walk Time 6 minutes 5 minutes    Resting HR 105 bpm 102 bpm (p) --  Pulse ox on 6 liters before 6 minute walk 98%.   Resting BP 124/70 mmHg 158/88 mmHg    Max Ex. HR 136 bpm 109 bpm    Max Ex. BP 182/82 mmHg 172/94 mmHg    RPE 17 14    Perceived Dyspnea  6 4 (p) --  Post 6 min pulse ox 88% on 6 liters oxgyen   Symptoms No        Initial Exercise Prescription:   Exercise Prescription Changes:     Exercise Prescription Changes       11/03/14 1500 11/06/14 1400 11/10/14 1300 11/13/14 1000 11/13/14 1400   Exercise Review   Progression    Yes Yes   Response to Exercise   Blood Pressure (Admit)    130/80 mmHg 130/80 mmHg   Blood Pressure (Exercise)    160/90 mmHg 160/90 mmHg   Blood Pressure (Exit)    142/84 mmHg 142/84 mmHg   Heart Rate (Admit)     82 bpm   Heart Rate (Exercise)     118 bpm   Heart Rate (Exit)     84 bpm   Oxygen Saturation (Admit)     92 %   Oxygen Saturation (Exercise)     88 %   Oxygen Saturation (Exit)     96 %   Rating of Perceived Exertion (Exercise)     13   Perceived Dyspnea (Exercise)     13   Intensity    THRR unchanged THRR unchanged   Progression    Continue progressive overload as per policy without signs/symptoms or physical distress. Continue progressive overload as per policy without signs/symptoms or physical distress.   Resistance Training   Training Prescription   Yes  2 lbs. Yes Yes   Weight   _0 Reps    10-12 10-12   Treadmill   MPH  1.4 1.5 1.4 1.4   Grade  0  0 0   Minutes  _1 NuStep   Level _2 Watts 50   30 30   Minutes _3 Recumbant Elliptical   Level  1  1 1.5   RPM  _4 Watts     12   Minutes  _5 12/06/14 1200 12/06/14 1500 12/08/14 1400 12/11/14 1200 01/01/15 1200   Exercise Review   Progression Yes Yes  Yes Yes   Response to Exercise   Blood Pressure (Admit) 130/80 mmHg 148/80 mmHg   118/78 mmHg   Blood Pressure (Exercise) 160/90 mmHg 142/74 mmHg   140/68 mmHg   Blood Pressure (Exit) 142/84 mmHg 140/80 mmHg   122/62 mmHg   Heart Rate (Admit)  82 bpm 95 bpm   80 bpm   Heart Rate (Exercise) 118 bpm 118 bpm   108 bpm   Heart Rate (Exit) 84 bpm 95 bpm   85 bpm   Oxygen Saturation (Admit) 92 % 94 %   91 %  6l/m continuous   Oxygen Saturation (Exercise) 88 % 90 %  6l/m continuous   86 %   Oxygen Saturation (Exit) 96 % 97 %   97 %   Rating of Perceived Exertion (Exercise) _0 Perceived Dyspnea  (Exercise) _1 Intensity THRR unchanged       Progression Continue progressive overload as per policy without signs/symptoms or physical distress.       Resistance Training   Training Prescription Yes    Yes   Weight 2    4   Reps 10-12    10-12   Treadmill   MPH 1.4  1.5 1.5 1.5   Grade 0  0 0 0   Minutes _2 NuStep   Level _3 Watts 30   45 50   Minutes 15    15   Recumbant Elliptical   Level _4 RPM 20   60 40   Watts 15       Minutes 10    12     01/15/15 1200 01/17/15 1500 01/22/15 1300 01/31/15 1200 01/31/15 1400   Exercise Review   Progression _5    Response to Exercise   Blood Pressure (Admit) 118/78 mmHg  118/78 mmHg 140/88 mmHg    Blood Pressure (Exercise) 140/68 mmHg  140/68 mmHg 156/80 mmHg    Blood Pressure (Exit) 122/62 mmHg  122/62 mmHg 118/80 mmHg    Heart Rate (Admit) 80 bpm  80 bpm 88 bpm    Heart Rate (Exercise) 108 bpm  108 bpm 122 bpm    Heart Rate (Exit) 85 bpm  85 bpm 98 bpm    Oxygen Saturation (Admit) 91 %  6l/m continuous  91 %  6l/m continuous 93 %    Oxygen Saturation (Exercise) 86 %  86 % 90 %    Oxygen Saturation (Exit) 97 %  97 % 94 %    Rating of Perceived Exertion (Exercise) _6 Perceived Dyspnea (Exercise) _7 Intensity  THRR unchanged      Progression  Continue progressive overload as per policy without signs/symptoms or physical distress.      Resistance Training   Training Prescription Yes Yes Yes Yes    Weight _8 Reps 10-12 10-12 10-12 10-12    Treadmill   MPH 1.5 1.5 1.5 1.8    Grade 0 0 0 0    Minutes _9 NuStep   Level _10 T5    Watts 50  50 80    Minutes _11 Recumbant Elliptical   Level _12 RPM 50  50 50    Minutes    15      02/12/15 1200 02/23/15 1500 03/27/15 0900 04/23/15 1100     Exercise Review   Progression Yes  Yes Yes    Response to Exercise   Blood Pressure (Admit)  134/83 mmHg 146/80 mmHg  Data 03/26/15  140/70 mmHg  Data 03/26/15    Blood Pressure (Exercise)  140/80 mmHg 150/70 mmHg 158/74 mmHg    Blood Pressure (Exit)  138/70 mmHg 148/80 mmHg 142/80 mmHg    Heart Rate (Admit)  101 bpm 84 bpm 84 bpm    Heart Rate (Exercise)  118 bpm 127 bpm 124 bpm    Heart Rate (Exit)  97 bpm 103 bpm 93 bpm    Oxygen Saturation (Admit)  91 % 97 % 92 %    Oxygen Saturation (Exercise)  89 % 88 %  8l/m C 93 %  8l/m C    Oxygen Saturation (Exit)  99 % 96 % 92 %    Rating of Perceived Exertion (Exercise)  _0 Perceived Dyspnea (Exercise)  _1 Symptoms    none    Frequency   Add 1 additional day to program exercise sessions. Add 1 additional day to program exercise sessions.    Duration   Progress to 50 minutes of aerobic without signs/symptoms of physical distress Progress to 50 minutes of aerobic without signs/symptoms of physical distress    Intensity   Rest + 30 Rest + 30    Progression   Continue progressive overload as per policy without signs/symptoms or physical distress. Continue progressive overload as per policy without signs/symptoms or physical distress.    Resistance Training   Training Prescription  Yes Yes Yes    Weight  _2 Reps  10-12 10-12 10-12    Treadmill   MPH  1.8 1.9 1.9    Grade  0 0 0    Minutes  _3 NuStep   Level 7  T5 Nustep 7  T5 Nustep 7  T5 7  T5    Watts 60 60 70 70    Minutes  _4 Recumbant Elliptical   Level  _5 RPM  50 60 60    Watts   15 15    Minutes  15      Home Exercise Plan   Plans to continue exercise at  --  Mr. Moxey plans to attend another 36 sessions per MD order.         Discharge Exercise Prescription (Final Exercise Prescription Changes):     Exercise Prescription Changes - 04/23/15 1100    Exercise Review   Progression Yes   Response to Exercise   Blood Pressure (Admit) 140/70 mmHg  Data 03/26/15   Blood Pressure (Exercise) 158/74 mmHg   Blood Pressure (Exit) 142/80 mmHg   Heart Rate (Admit)  84 bpm   Heart Rate (Exercise) 124 bpm   Heart Rate (Exit) 93 bpm   Oxygen Saturation (Admit) 92 %   Oxygen Saturation (Exercise) 93 %  8l/m C   Oxygen Saturation (Exit) 92 %   Rating of Perceived Exertion (Exercise) 13   Perceived Dyspnea (Exercise) 4   Symptoms none   Frequency Add 1 additional day to program exercise sessions.   Duration Progress to 50 minutes of aerobic without signs/symptoms of physical distress   Intensity Rest + 30   Progression Continue progressive overload as per policy without signs/symptoms or physical distress.   Resistance Training   Training Prescription Yes   Weight 5   Reps 10-12   Treadmill   MPH 1.9   Grade 0  Minutes 15   NuStep   Level 7  T5   Watts 70   Minutes 15   Recumbant Elliptical   Level 7   RPM 60   Watts 15       Nutrition:  Target Goals: Understanding of nutrition guidelines, daily intake of sodium <1549m, cholesterol <2087m calories 30% from fat and 7% or less from saturated fats, daily to have 5 or more servings of fruits and vegetables.  Biometrics:    Nutrition Therapy Plan and Nutrition Goals:     Nutrition Therapy & Goals - 12/21/14 1200    Nutrition Therapy   Diet Instructed patient on a 2000 calorie vegan meal plan, DASH diet principles.  He states that he is following Dr. EsMaurine MinisterReverse Heart Disease" diet which is vegan and eliminates all oils but admits he is having difficulty following it. Discussed the benefits of a vegan diet but stresssed that have to be careful to include adequate protein.   Fiber 20 grams   Whole Grain Foods 4 servings   Protein 8 ounces/day   Saturated Fats 13 max. grams   Fruits and Vegetables 5 servings/day   Personal Nutrition Goals   Personal Goal #1 To space meals 4-6 hours apart.   Personal Goal #2 Include a protein source at every meal. Refer to list given on meat alternatives. Goal of 75 gms protein/day.   Personal Goal #3 Use my Fitness Pal to record  food/beverage intake   Personal Goal #4 In addtions to strategies discussed to help with weight loss, refer to "National Weight Loss Registry" for numerous other healthy strategies that participants have used to help lose and maintain weight loss.        Nutrition Discharge: Rate Your Plate Scores:     Rate Your Plate - 0717/49/4429675  Rate Your Plate Scores   Pre Score 55   Pre Score % 79 %  Due to vegan diet, many of the questions were  N/A      Psychosocial: Target Goals: Acknowledge presence or absence of depression, maximize coping skills, provide positive support system. Participant is able to verbalize types and ability to use techniques and skills needed for reducing stress and depression.  Initial Review & Psychosocial Screening:   Quality of Life Scores:     Quality of Life - 02/19/15 1130    Quality of Life Scores   Health/Function Post 9.63 %   Socioeconomic Post 14.75 %   Psych/Spiritual Post 12.64 %   Family Post 18.6 %   GLOBAL Post 12.69 %      PHQ-9:     Recent Review Flowsheet Data    Depression screen [facility] 02/19/2015 02/12/2015 10/31/2014   Decreased Interest _0 Down, Depressed, Hopeless _1 PHQ - 2 Score _2 Altered sleeping 0 0 0   Tired, decreased energy _3 Change in appetite _4 Feeling bad or failure about yourself  _5 Trouble concentrating _6 Moving slowly or fidgety/restless _7 Suicidal thoughts _8 PHQ-9 Score _9 Difficult doing work/chores Somewhat difficult - Somewhat difficult      Psychosocial Evaluation and Intervention:     Psychosocial Evaluation - 11/08/14 1627    Psychosocial Evaluation & Interventions   Interventions Stress management education;Encouraged to exercise with the program  and follow exercise prescription;Relaxation education   Comments Counselor met with Mr. Pettie today for initial psychosocial evaluation.  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  He lives with a spouse of 9 years and several friends whom he refers to as part of his support system.  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 Jenetta Downer reporting he doesn't use his CPAP to sleep as it is not user friendly.  Counselor reviewed the PHQ-9 with a score of "17" with Mr. Jenetta Downer confirming all his answers are current and accurate.  When assessed for depression, he denies a history and states any current symptoms are "situational" due to his health issues primarily.  Mr. Jenetta Downer also reports current stressors are health, financial, conflict with his daughter, a close cousin with pancreatic cancer and his inability to do the things that used to bring him pleasure, such as playing the guitar/banjo with friends and playing his harmonica.  Mr. Colman Cater goals for this program are to breathe better, lose some weight, increase his physical strength and be stronger and more able to walk/go shopping without being "tied to this big oxygen tank."  Counselor discussed Mr. Jenetta Downer mentioning to his Dr. for a medication evaluation for his depressive symptoms and possibly seeing a therpist for support currently.  He preferred to wait to see if this program helps him feel better/stronger first.  Counselor will re-assess over the next few weeks and make further recommendations at that time.      Continued Psychosocial Services Needed Yes  Mr. O will benefit from all the educational components of this program, and participated in Relaxation education on this date.  He will benefit from meeting with a dietician, and attending the depression and stress management educational components.      Psychosocial Re-Evaluation:  Education: Education Goals: Education classes will be provided on a weekly basis, covering required topics. Participant will state understanding/return demonstration of topics presented.  Learning  Barriers/Preferences:   Education Topics: Initial Evaluation Education: - Verbal, written and demonstration of respiratory meds, RPE/PD scales, oximetry and breathing techniques. Instruction on use of nebulizers and MDIs: cleaning and proper use, rinsing mouth with steroid doses and importance of monitoring MDI activations.          Pulmonary Rehab from 04/30/2015 in [place]   Date  10/31/14   Educator  LB   Instruction Review Code  2- meets goals/outcomes      General Nutrition Guidelines/Fats and Fiber: -Group instruction provided by verbal, written material, models and posters to present the general guidelines for heart healthy nutrition. Gives an explanation and review of dietary fats and fiber.      Pulmonary Rehab from 04/30/2015 in [place]   Date  02/05/15   Educator  C. Joneen Caraway, RD   Instruction Review Code  2- meets goals/outcomes      Controlling Sodium/Reading Food Labels: -Group verbal and written material supporting the discussion of sodium use in heart healthy nutrition. Review and explanation with models, verbal and written materials for utilization of the food label.      Pulmonary Rehab from 04/30/2015 in [place]   Date  02/12/15   Educator  C. Rusell, RD   Instruction Review Code  2- meets goals/outcomes      Exercise Physiology & Risk  Factors: - Group verbal and written instruction with models to review the exercise physiology of the cardiovascular system and associated critical values. Details cardiovascular disease risk factors and the goals associated with each risk factor.   Aerobic Exercise & Resistance Training: - Gives group verbal and written discussion on the health impact of inactivity. On the components of aerobic and resistive training programs and the benefits of this training and how to safely progress through these programs.       Pulmonary Rehab from 04/30/2015 in [place]   Date  12/27/14   Educator  SW   Instruction Review Code  2- meets goals/outcomes      Flexibility, Balance, General Exercise Guidelines: - Provides group verbal and written instruction on the benefits of flexibility and balance training programs. Provides general exercise guidelines with specific guidelines to those with heart or lung disease. Demonstration and skill practice provided.      Pulmonary Rehab from 04/30/2015 in [place]   Date  01/24/15   Educator  SW   Instruction Review Code  2- meets goals/outcomes      Stress Management: - Provides group verbal and written instruction about the health risks of elevated stress, cause of high stress, and healthy ways to reduce stress.      Pulmonary Rehab from 04/30/2015 in [place]   Date  01/17/15   Educator  [facility]   Instruction Review Code  2- meets goals/outcomes      Depression: - Provides group verbal and written instruction on the correlation between heart/lung disease and depressed mood, treatment options, and the stigmas associated with seeking treatment.      Pulmonary Rehab from 04/30/2015 in [place]   Date  03/14/15   Educator  Berle Mull, MSW   Instruction Review Code  2- meets goals/outcomes      Exercise & Equipment Safety: - Individual verbal instruction and demonstration of equipment use and safety with use of the equipment.      Pulmonary Rehab from 04/30/2015 in [place]   Date  01/15/15   Educator  L. Owens Shark, RRT [facility] [place]]   Instruction Review Code  2- meets goals/outcomes      Infection Prevention: - Provides verbal and written material to individual with discussion of infection control including proper hand washing and proper equipment cleaning during  exercise session.      Pulmonary Rehab from 04/30/2015 in [place]   Date  11/03/14   Educator  Jacqulyn Ducking   Instruction Review Code  2- meets goals/outcomes      Falls Prevention: - Provides verbal and written material to individual with discussion of falls prevention and safety.   Diabetes: - Individual verbal and written instruction to review signs/symptoms of diabetes, desired ranges of glucose level fasting, after meals and with exercise. Advice that pre and post exercise glucose checks will be done for 3 sessions at entry of program.      Pulmonary Rehab from 04/30/2015 in [place]   Date  02/23/15 [facility] your numbers]   Educator  C. Enterkin   Instruction Review Code  2- meets goals/outcomes      Chronic Lung Diseases: - Group verbal and written instruction to review new updates, new respiratory medications, new advancements in procedures and treatments. Provide informative websites and "800" numbers of self-education.  Pulmonary Rehab from 04/30/2015 in [place]   Date  04/30/15   Educator  LB   Instruction Review Code  2- meets goals/outcomes      Lung Procedures: - Group verbal and written instruction to describe testing methods done to diagnose lung disease. Review the outcome of test results. Describe the treatment choices: Pulmonary Function Tests, ABGs and oximetry.      Pulmonary Rehab from 04/30/2015 in [place]   Date  01/26/15   Educator  [place]   Instruction Review Code  2- meets goals/outcomes      Energy Conservation: - Provide group verbal and written instruction for methods to conserve energy, plan and organize activities. Instruct on pacing techniques, use of adaptive equipment and posture/positioning to relieve shortness of breath.      Pulmonary Rehab from 04/30/2015 in [place]   Date  02/07/15   Educator  SW   Instruction Review Code  2- meets goals/outcomes      Triggers: - Group verbal and written instruction to review types of environmental controls: home humidity, furnaces, filters, dust mite/pet prevention, HEPA vacuums. To discuss weather changes, air quality and the benefits of nasal washing.      Pulmonary Rehab from 04/30/2015 in [place]   Date  01/01/15   Educator  LB   Instruction Review Code  2- meets goals/outcomes      Exacerbations: - Group verbal and written instruction to provide: warning signs, infection symptoms, calling MD promptly, preventive modes, and value of vaccinations. Review: effective airway clearance, coughing and/or vibration techniques. Create an Sports administrator.   Oxygen: - Individual and group verbal and written instruction on oxygen therapy. Includes supplement oxygen, available portable oxygen systems, continuous and intermittent flow rates, oxygen safety, concentrators, and Medicare reimbursement for oxygen.      Pulmonary Rehab from 04/30/2015 in [place]   Date  10/31/14   Educator  LB   Instruction Review Code  2- meets goals/outcomes      Respiratory Medications: - Group verbal and written instruction to review medications for lung disease. Drug class, frequency, complications, importance of spacers, rinsing mouth after steroid MDI's, and proper cleaning methods for nebulizers.      Pulmonary Rehab from 04/30/2015 in [place]   Date  10/31/14   Educator  LB   Instruction Review Code  2- meets goals/outcomes      AED/CPR: - Group verbal and written instruction with the use of models to demonstrate the basic use of the AED with the basic ABC's of resuscitation.      Pulmonary Rehab from 04/30/2015 in [place]   Date  02/02/15    Educator  CE   Instruction Review Code  2- meets goals/outcomes      Breathing Retraining: - Provides individuals verbal and written instruction on purpose, frequency, and proper technique of diaphragmatic breathing and pursed-lipped breathing. Applies individual practice skills.      Pulmonary Rehab from 04/30/2015 in [place]   Date  10/31/14   Educator  LB   Instruction Review Code  R- Review/reinforce      Anatomy and Physiology of the Lungs: - Group verbal and written instruction with the use of models to provide basic lung anatomy and physiology related to function, structure and complications of lung disease.  Heart Failure: - Group verbal and written instruction on the basics of heart failure: signs/symptoms, treatments, explanation of ejection fraction, enlarged heart and cardiomyopathy.      Pulmonary Rehab from 04/30/2015 in [place]   Date  01/12/15   Educator  Frederich Cha, RRT   Instruction Review Code  2- meets goals/outcomes      Sleep Apnea: - Individual verbal and written instruction to review Obstructive Sleep Apnea. Review of risk factors, methods for diagnosing and types of masks and machines for OSA.   Anxiety: - Provides group, verbal and written instruction on the correlation between heart/lung disease and anxiety, treatment options, and management of anxiety.      Pulmonary Rehab from 04/30/2015 in [place]   Date  02/14/15   Educator  Berle Mull, MSW   Instruction Review Code  2- Meets goals/outcomes      Relaxation: - Provides group, verbal and written instruction about the benefits of relaxation for patients with heart/lung disease. Also provides patients with examples of relaxation techniques.      Pulmonary Rehab from 04/30/2015 in [place]   Date  11/08/14   Educator  Lucianne Lei  LCSW   Instruction Review Code  2- Meets goals/outcomes      Knowledge Questionnaire Score:     Knowledge Questionnaire Score - 02/20/15 1018    Knowledge Questionnaire Score   Post Score -1      Personal Goals and Risk Factors at Admission:     Personal Goals and Risk Factors at Admission - 03/02/15 1130    Personal Goals and Risk Factors on Admission    Weight Management Yes      Personal Goals and Risk Factors Review:      Goals and Risk Factor Review      11/15/14 1130 11/29/14 1130 12/06/14 1130 12/20/14 1248 12/20/14 1548   Weight Management   Goals Progress/Improvement seen Yes       Comments Mr Crescenzo has filled out the dietitian's questionnaire; it was faxed to her; and he will meet with her after the holiday.       Increase Aerobic Exercise and Physical Activity   Goals Progress/Improvement seen    Yes     Comments   Mr Mathieson has had increases with his exercise goals on the TM, T5, and TM.     Improve shortness of breath with ADL's   Goals Progress/Improvement seen    Yes     Comments   In working with Mr Kauffmann on his oxygen and O2Sat's, he has been able to increase his exercise goals which help improve his shortness of breath, especially with the correct oxygen flow rates.     Breathing Techniques   Goals Progress/Improvement seen    Yes  Yes   Comments   Mr Krysiak works very hard to incorporate PLB with his exercise.  Encouraging Mr Borgerding to use PLB with his exercise in [place]. With his low O2Sat's on the TM today, he improved his readings with the PLB. He states he really has to make himself breath deep. Encouraged him to use PLB  when walking into [place] and at his apartment in the long hallway to elevator.   Increase knowledge of respiratory medications   Goals Progress/Improvement seen   Yes      Comments  Worked with Mr Denardo and portable oxygen; I had him use the Eclipse which is a  demo and used in our gym; He was not able to maintain O2Sat's on the 6l/m intermittent  and we switched him to the 6l/m gas portable with continuous flow.  I also gave him a copy of a Pulmonary PPaper which had a great article on portable concentrators.      Other Goal   Goals Progress/Improvement seen     Yes    Comments    Discussed with Jasean about a piece of exercise equipment he has avaiable for home use and the benefits of it.      01/01/15 1234 01/08/15 1547 01/15/15 1130 01/19/15 1130 01/24/15 1130   Weight Management   Goals Progress/Improvement seen Yes  No     Comments ongoing, would like to lose 150 lbs.  Asked  Mr Sperbeck if he had an appointment with the dietitian yet; he said he did meet with her but continues to struggle with his diet - large portion sizes and snacking late at night; his weight is averaging  340lbs.  He has been on a [employer]  since 2006 and has helped his cardiac health.     Increase Aerobic Exercise and Physical Activity   Goals Progress/Improvement seen  Yes   Yes    Comments continue to make gains on the treadmill   Mr Turrell states his endurance is increasing. 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 He still needs to find a oxygen portable concentrator that will work for this that would cover the time involved.    Improve shortness of breath with ADL's   Goals Progress/Improvement seen  Yes Yes   Yes   Comments  Mr Ritacco is managing his shortness of breath in [facility] by managing  his oxygen and adjusting the flow rate to 6l/m with exercise and with some activites at home activities   Mr Ryder brought his harmonica in to class and played it for the class. We talked about the benefits of playing the harmonica on inspiratory and expiratory muscle strength and PLB technique.   Breathing Techniques   Goals Progress/Improvement seen  Yes       Comments wants to improve, also incorporate meditation       Increase knowledge of respiratory medications   Goals Progress/Improvement seen   Yes       Comments  Mr Boissonneault has a good understanding of his Spiriva; he states that he is not sure "it works", but explained the importance of Spiriva, especially with activity.      Stress   Goal To meet with psychosocial counselor for stress and relaxation information and guidance. To state understanding of performing relaxation techniques and or identifying personal stressors.         01/24/15 1330 01/29/15 1550 03/19/15 1130 03/21/15 1130 03/26/15 1130   Weight Management   Goals Progress/Improvement seen   No     Comments   Mr Quinto has maintained his weight at 340-343. He continues with his Seama for his heart disease.     Understand more about Heart/Pulmonary Disease   Goals Progress/Improvement seen  Yes  Yes Yes    Comments Has enjoyed all the education classes, has trouble retaining information or recalling information, but he still enjoys the sessions and feels like they are helping him better manage his disease.   Mr Artman brought in his PFT and CT results from recent testing. We discussed his Fev1 and how it related  to COPD. I plan to continue reviewing his other results. Mr Gagen is very open for  new knowledge about lung disease. I continued to educate Mr Pastorino on his recent PFT . I explained the FEV1, RV, and DLCO. He is being referred to a pulmonologist which he is very positive about for answers about his lung condition.    Improve shortness of breath with ADL's   Goals Progress/Improvement seen  Yes    Yes   Comments Has not seen much improvement in SOB since last month, but is still happy with his progress in the program.     Mr Lierman has good days and bad days with his breathing. He is adjusting his oxygen with exercise and pacing himself with activites. He is looking forward to consulting with a pulmonologist for more detail about his shortness of breath and breathing.   Breathing Techniques   Goals Progress/Improvement seen  Yes    Yes   Comments Uses PLB when his O2 saturation  is low or when he feels short of breath. He especially utilizes this during class while exercising, but also uses it around the house and when he is out.     Mr Gates uses PLB, especially on the TM and has maintined acceptable O2Sat with this exercise.   Increase knowledge of respiratory medications   Goals Progress/Improvement seen   Yes   Yes   Comments  Discussed with Mr Herrod about use an oral device with his sleep apnea, since he can not tolerate the CPAP masks, he was open to it and gave him a list of dentists in the area who are certiified in the oral devices.   Mr Kistler is taking his Spiriva daily. So far, the "E" cylinders are his portal system for oxygen.   Diabetes   Goal     --  No problems with his diabete in [facility]   Hypertension   Goal     --  Maintaining acceptable BP at rest wnd with exercise in [facility].   Abnormal Lipids   Goal     --  No problems with cholesterol reading in [facility].   Stress   Goal     --  Mr Maalouf has met with the counselor in [place]. I have educated him on his  pulmonary test results and on portable concentrators.     04/02/15 0853 04/04/15 1130 04/11/15 1603 04/18/15 1200 04/18/15 1244   Weight Management   Goals Progress/Improvement seen    Yes --  Prediabetes on metformin. Instructed what HBA!c blood work means since [place] said no one told him to check his blood sugars at home since they do blood work.    Comments    maintaining current weight well.    Increase Aerobic Exercise and Physical Activity   Goals Progress/Improvement seen  No  Yes Yes    Comments Has not seen much improvement, however, has been remaining stable which is key for him at the juncture.  Mr Drumwright walked on the TM at 2.0 for the entire 62mns and maintained acceptable O2Sats. maintaing current levels with acceptable responses    Improve shortness of breath with ADL's   Goals Progress/Improvement seen     Yes    Comments    maintaining SOB symptoms.    Breathing  Techniques   Goals Progress/Improvement seen     Yes    Comments    continuing to practice PLB with exertion    Increase knowledge of respiratory  medications   Goals Progress/Improvement seen   Yes      Comments  Mr Shackleton has been approved for a portable concentrator, and [facility] has order the oxygen system.      Hypertension   Goal    Participant will see blood pressure controlled within the values of 140/64m/Hg or within value directed by their physician.  still receiving acceptable responses --  Blood pressure is stable today and usually.      04/18/15 1544 04/24/15 0727         Understand more about Heart/Pulmonary Disease   Goals Progress/Improvement seen  Yes       Comments Mr OAltergotthas an appointment with a pulmonologist on 05/24/2015. He is disappointed that the appointment is not sooner, but hopeful he will learn more about his lung disease.       Diabetes   Goal Blood glucose control identified by blood glucose values, HgbA1C. Participant verbalizes understanding of the signs/symptoms of hyper/hypo glycemia, proper foot care and importance of medication and nutrition plan for blood glucose control.       Abnormal Lipids   Goal  Cholesterol controlled with medications as prescribed, with individualized exercise RX and with personalized nutrition plan. Value goals: LDL < 72m HDL > 4055mParticipant states understanding of desired cholesterol values and following prescriptions.      Stress   Goal  --  Mr OlsPowley much more relaxed when he comes to class. He has accomplished many positive changes - more understanding of his lung disease, plus has an appointment with a pulmonologist, he has a better portable oxygen system order .            Personal Goals Discharge (Final Personal Goals and Risk Factors Review):      Goals and Risk Factor Review - 04/24/15 0727    Abnormal Lipids   Goal Cholesterol controlled with medications as prescribed, with individualized exercise RX and  with personalized nutrition plan. Value goals: LDL < 69m78mDL > 40mg31mrticipant states understanding of desired cholesterol values and following prescriptions.   Stress   Goal --  Mr OlsonTrudeluch more relaxed when he comes to class. He has accomplished many positive changes - more understanding of his lung disease, plus has an appointment with a pulmonologist, he has a better portable oxygen system order .         Comments: 30 day note review

## 2015-05-07 ENCOUNTER — Encounter: Payer: Medicare Other | Admitting: *Deleted

## 2015-05-07 DIAGNOSIS — G4730 Sleep apnea, unspecified: Secondary | ICD-10-CM

## 2015-05-07 DIAGNOSIS — J449 Chronic obstructive pulmonary disease, unspecified: Secondary | ICD-10-CM

## 2015-05-07 DIAGNOSIS — J42 Unspecified chronic bronchitis: Secondary | ICD-10-CM | POA: Diagnosis not present

## 2015-05-07 NOTE — Progress Notes (Signed)
Daily Session Note  Patient Details  Name: Miguel Palmer MRN: [id number] Date of Birth: Oct 08, 1940 Referring Provider:  [name], MD  Encounter Date: 05/07/2015  Check In:     Session Check In - 05/07/15 1247    Check-In   Staff Present Carson Myrtle BS, RRT, Respiratory Therapist;Carroll Enterkin RN, BSN;Steven Way BS, ACSM EP-C, Exercise Physiologist   ER physicians immediately available to respond to emergencies [facility] immediately available ER MD   Physician(s)  Drs. Lord and [employer]   Medication changes reported     No   Warm-up and Cool-down Performed on first and last piece of equipment   Pain Assessment   Currently in Pain? No/denies         Goals Met:  Exercise tolerated well Strength training completed today  Goals Unmet:  Not Applicable  Goals Comments: Goals anticipated to be met in 19  more sessions. Patient completed exercise prescription and all exercise goals during rehab sessions.The exercise was tolerated well, and the patient is progressing in the program.     Dr. Emily Filbert is Medical Director for [place] and [facility].

## 2015-05-09 ENCOUNTER — Encounter: Payer: Medicare Other | Admitting: *Deleted

## 2015-05-09 ENCOUNTER — Encounter: Payer: Self-pay | Admitting: Respiratory Therapy

## 2015-05-09 DIAGNOSIS — J42 Unspecified chronic bronchitis: Secondary | ICD-10-CM | POA: Diagnosis not present

## 2015-05-09 DIAGNOSIS — J449 Chronic obstructive pulmonary disease, unspecified: Secondary | ICD-10-CM

## 2015-05-09 NOTE — Progress Notes (Signed)
Daily Session Note  Patient Details  Name: Miguel Palmer MRN: [id number] Date of Birth: 12-15-1940 Referring Provider:  [name], MD  Encounter Date: 05/09/2015  Check In:     Session Check In - 05/09/15 1156    Check-In   Staff Present Carson Myrtle BS, RRT, Respiratory Therapist;Analiya Porco Dillard Essex MS, ACSM CEP Exercise Physiologist;Steven Way BS, ACSM EP-C, Exercise Physiologist   ER physicians immediately available to respond to emergencies [facility] immediately available ER MD   Physician(s) Jimmye Norman and Joni Fears   Medication changes reported     No   Fall or balance concerns reported    No   Warm-up and Cool-down Performed on first and last piece of equipment   VAD Patient? No   Pain Assessment   Currently in Pain? No/denies   Multiple Pain Sites No           Exercise Prescription Changes - 05/09/15 1200    Exercise Review   Progression Yes   Response to Exercise   Symptoms None   Comments Completed the mid program 6MW test and did very well. He improived over 300 feet from when he did his discharge walk in the program previously. These results were reviewed with him and he was very encouraged to hear this.    Duration Progress to 50 minutes of aerobic without signs/symptoms of physical distress   Intensity Rest + 30   Progression Continue progressive overload as per policy without signs/symptoms or physical distress.   Resistance Training   Training Prescription Yes   Weight 5   Reps 10-12   Interval Training   Interval Training No   Treadmill   MPH 1.9   Grade 0   Minutes 15   NuStep   Level 7  T5   Watts 70   Minutes 15   Recumbant Elliptical   Level 7   RPM 60   Watts 15      Goals Met:  Proper associated with RPD/PD & O2 Sat Independence with exercise equipment Using PLB without cueing & demonstrates good technique Exercise tolerated well Personal goals reviewed Strength training completed today  Goals Unmet:  Not  Applicable  Goals Comments: Patient completed exercise prescription and all exercise goals during rehab session. The exercise was tolerated well and the patient is progressing in the program.  Personal and exercise goals expected to be met in 18 more sessions. Progress on specific individualized goals will be charted in patient's ITP. Upon completion of the program the patient will be comfortable managing exercise goals and progression on their own.    Dr. Emily Filbert is Medical Director for [place] and [facility].

## 2015-05-11 ENCOUNTER — Encounter: Payer: Medicare Other | Admitting: *Deleted

## 2015-05-11 DIAGNOSIS — J449 Chronic obstructive pulmonary disease, unspecified: Secondary | ICD-10-CM

## 2015-05-11 DIAGNOSIS — J42 Unspecified chronic bronchitis: Secondary | ICD-10-CM | POA: Diagnosis not present

## 2015-05-11 NOTE — Progress Notes (Signed)
Daily Session Note  Patient Details  Name: Miguel Palmer MRN: [id number] Date of Birth: [birth year] Referring Provider:  [name], MD  Encounter Date: 05/11/2015  Check In:     Session Check In - 05/11/15 1157    Check-In   Staff Present Frederich Cha RRT, RCP Respiratory Therapist;Carroll Enterkin RN, Drusilla Kanner MS, ACSM CEP Exercise Physiologist   ER physicians immediately available to respond to emergencies [facility] immediately available ER MD   Physician(s) Burlene Arnt and Quale   Medication changes reported     No   Fall or balance concerns reported    No   Warm-up and Cool-down Performed on first and last piece of equipment   VAD Patient? No   Pain Assessment   Currently in Pain? No/denies   Multiple Pain Sites No         Goals Met:  Proper associated with RPD/PD & O2 Sat Independence with exercise equipment Using PLB without cueing & demonstrates good technique Exercise tolerated well Strength training completed today  Goals Unmet:  Not Applicable  Goals Comments: Patient completed exercise prescription and all exercise goals during rehab session. The exercise was tolerated well and the patient is progressing in the program.  Personal and exercise goals expected to be met in 17 more sessions. Progress on specific individualized goals will be charted in patient's ITP. Upon completion of the program the patient will be comfortable managing exercise goals and progression on their own.    Dr. Emily Filbert is Medical Director for [place] and [facility].

## 2015-05-14 DIAGNOSIS — J449 Chronic obstructive pulmonary disease, unspecified: Secondary | ICD-10-CM

## 2015-05-14 DIAGNOSIS — J42 Unspecified chronic bronchitis: Secondary | ICD-10-CM | POA: Diagnosis not present

## 2015-05-14 DIAGNOSIS — G4733 Obstructive sleep apnea (adult) (pediatric): Secondary | ICD-10-CM

## 2015-05-14 NOTE — Progress Notes (Signed)
Daily Session Note  Patient Details  Name: Forrester Blando MRN: [id number] Date of Birth: 1941/03/29 Referring Provider:  [name], MD  Encounter Date: 05/14/2015  Check In:     Session Check In - 05/14/15 1222    Check-In   Staff Present Heath Lark RN, BSN, CCRP;Gwynn Crossley BS, ACSM EP-C, Exercise Physiologist;Carroll Radio producer, BSN   ER physicians immediately available to respond to emergencies [facility] immediately available ER MD   Physician(s) kinner and williams   Medication changes reported     No   Fall or balance concerns reported    No   Warm-up and Cool-down Performed on first and last piece of equipment   VAD Patient? No   Pain Assessment   Currently in Pain? No/denies         Goals Met:  Proper associated with RPD/PD & O2 Sat Exercise tolerated well No report of cardiac concerns or symptoms Strength training completed today  Goals Unmet:  Not Applicable  Goals Comments:   Dr. Emily Filbert is Medical Director for [place] and [facility].

## 2015-05-21 ENCOUNTER — Encounter: Payer: Medicare Other | Admitting: *Deleted

## 2015-05-21 ENCOUNTER — Encounter: Payer: Self-pay | Admitting: Respiratory Therapy

## 2015-05-21 DIAGNOSIS — G4730 Sleep apnea, unspecified: Secondary | ICD-10-CM

## 2015-05-21 DIAGNOSIS — J449 Chronic obstructive pulmonary disease, unspecified: Secondary | ICD-10-CM

## 2015-05-21 DIAGNOSIS — J42 Unspecified chronic bronchitis: Secondary | ICD-10-CM | POA: Diagnosis not present

## 2015-05-21 NOTE — Progress Notes (Signed)
Daily Session Note  Patient Details  Name: Graydon Fofana MRN: [id number] Date of Birth: 1941/04/29 Referring Provider:  [name], MD  Encounter Date: 05/21/2015  Check In:     Session Check In - 05/21/15 1231    Check-In   Staff Present Carson Myrtle, BS, RRT, Respiratory Therapist;Ambry Dix, RN, BSN;Steven Way, BS, ACSM EP-C, Exercise Physiologist   ER physicians immediately available to respond to emergencies See telemetry face sheet for immediately available ER MD   Physician(s) Dr. Jimmye Norman, and Dr. Claudette Head   Medication changes reported     No   Fall or balance concerns reported    No   Warm-up and Cool-down Performed on first and last piece of equipment   VAD Patient? No   Pain Assessment   Currently in Pain? Yes   Pain Score 4    Pain Location Back   Pain Type Chronic pain   Pain Onset More than a month ago         Goals Met:  Proper associated with RPD/PD & O2 Sat Exercise tolerated well  Goals Unmet:  Not Applicable  Goals Comments:    Dr. Emily Filbert is Medical Director for [place] and [facility].

## 2015-05-21 NOTE — Progress Notes (Signed)
Cardiac Individual Treatment Plan  Patient Details  Name: Miguel Palmer MRN: [id number] Date of Birth: Aug 10, 1940 Referring Provider:  [name], MD  Initial Encounter Date:    Visit Diagnosis: COPD, mild ([place])  Sleep apnea  Patient's Home Medications on Admission:  Current outpatient prescriptions:  .  aspirin 81 MG tablet, Take 81 mg by mouth every other day., Disp: , Rfl:  .  co-enzyme Q-10 30 MG capsule, Take 30 mg by mouth 2 (two) times daily., Disp: , Rfl:  .  dextroamphetamine (DEXEDRINE SPANSULE) 15 MG 24 hr capsule, Take 15 mg by mouth daily., Disp: , Rfl:  .  gabapentin (NEURONTIN) 400 MG capsule, Take by mouth., Disp: , Rfl:  .  Ginkgo Biloba 60 MG TABS, Take by mouth., Disp: , Rfl:  .  Glucosamine Sulfate 1000 MG CAPS, Take by mouth., Disp: , Rfl:  .  isosorbide mononitrate (IMDUR) 30 MG 24 hr tablet, TAKE 1 TABLET BY MOUTH EVERY DAY, Disp: , Rfl:  .  metFORMIN (GLUCOPHAGE) 500 MG tablet, TAKE 1 TABLET BY MOUTH DAILY., Disp: , Rfl:  .  metoprolol succinate (TOPROL-XL) 25 MG 24 hr tablet, TAKE 1 TABLET BY MOUTH EVERY DAY, Disp: , Rfl:  .  modafinil (PROVIGIL) 200 MG tablet, Take by mouth., Disp: , Rfl:  .  Multiple Vitamins-Minerals (MULTIVITAMIN & MINERAL PO), Take by mouth., Disp: , Rfl:  .  niacin (NIASPAN) 1000 MG CR tablet, Take by mouth., Disp: , Rfl:  .  nitroGLYCERIN (NITROSTAT) 0.4 MG SL tablet, Place under the tongue., Disp: , Rfl:  .  prasugrel (EFFIENT) 10 MG TABS tablet, TAKE 1 TABLET BY MOUTH EVERY DAY, Disp: , Rfl:  .  rosuvastatin (CRESTOR) 20 MG tablet, Take by mouth., Disp: , Rfl:  .  tiotropium (SPIRIVA) 18 MCG inhalation capsule, Place into inhaler and inhale., Disp: , Rfl:   Past Medical History: No past medical history on file.  Tobacco Use: History  Smoking status  . Former Smoker -- 2.00 packs/day for 22 years  . Types: Cigarettes  Smokeless tobacco  . 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       Exercise Target Goals:    Exercise Program Goal: Individual exercise prescription set with THRR, safety & activity barriers. Participant demonstrates ability to understand and report RPE using BORG scale, to self-measure pulse accurately, and to acknowledge the importance of the exercise prescription.  Exercise Prescription Goal: Starting with aerobic activity 30 plus minutes a day, 3 days per week for initial exercise prescription. Provide home exercise prescription and guidelines that participant acknowledges understanding prior to discharge.  Activity Barriers & Risk Stratification:   6 Minute Walk:     6 Minute Walk      01/03/15 1225 02/09/15 1159 02/09/15 1214   6 Minute Walk   Phase Mid Program Discharge    Distance 880 feet 600 feet    Walk Time 6 minutes 5 minutes    Resting HR 105 bpm 102 bpm (p) --  Pulse ox on 6 liters before 6 minute walk 98%.   Resting BP 124/70 mmHg 158/88 mmHg    Max Ex. HR 136 bpm 109 bpm    Max Ex. BP 182/82 mmHg 172/94 mmHg    RPE 17 14    Perceived Dyspnea  6 4 (p) --  Post 6 min pulse ox 88% on 6 liters oxgyen  Symptoms No       05/09/15 1157       6 Minute Walk   Phase Mid Program     Distance 915 feet     Walk Time 6 minutes     Resting HR 88 bpm     Resting BP 148/80 mmHg     Max Ex. HR 143 bpm     Max Ex. BP 164/86 mmHg     RPE 17     Perceived Dyspnea  5     Symptoms No        Initial Exercise Prescription:   Exercise Prescription Changes:     Exercise Prescription Changes      12/06/14 1200 12/06/14 1500 12/08/14 1400 12/11/14 1200 01/01/15 1200   Exercise Review   Progression Yes Yes  Yes Yes   Response to Exercise   Blood Pressure (Admit) 130/80 mmHg 148/80 mmHg   118/78 mmHg   Blood Pressure (Exercise) 160/90 mmHg 142/74 mmHg   140/68 mmHg   Blood Pressure (Exit) 142/84 mmHg 140/80 mmHg   122/62 mmHg   Heart Rate (Admit) 82 bpm 95 bpm   80 bpm   Heart Rate  (Exercise) 118 bpm 118 bpm   108 bpm   Heart Rate (Exit) 84 bpm 95 bpm   85 bpm   Oxygen Saturation (Admit) 92 % 94 %   91 %  6l/m continuous   Oxygen Saturation (Exercise) 88 % 90 %  6l/m continuous   86 %   Oxygen Saturation (Exit) 96 % 97 %   97 %   Rating of Perceived Exertion (Exercise) 13 13   13    Perceived Dyspnea (Exercise) 13 4   4    Intensity THRR unchanged       Progression Continue progressive overload as per policy without signs/symptoms or physical distress.       Resistance Training   Training Prescription Yes    Yes   Weight 2    4   Reps 10-12    10-12   Treadmill   MPH 1.4  1.5 1.5 1.5   Grade 0  0 0 0   Minutes 12  12 12 12    NuStep   Level 3   5 5    Watts 30   45 50   Minutes 15    15   Recumbant Elliptical   Level 2   3 5    RPM 20   60 40   Watts 15       Minutes 10    12     01/15/15 1200 01/17/15 1500 01/22/15 1300 01/31/15 1200 01/31/15 1400   Exercise Review   Progression Yes Yes Yes Yes Yes   Response to Exercise   Blood Pressure (Admit) 118/78 mmHg  118/78 mmHg 140/88 mmHg    Blood Pressure (Exercise) 140/68 mmHg  140/68 mmHg 156/80 mmHg    Blood Pressure (Exit) 122/62 mmHg  122/62 mmHg 118/80 mmHg    Heart Rate (Admit) 80 bpm  80 bpm 88 bpm    Heart Rate (Exercise) 108 bpm  108 bpm 122 bpm    Heart Rate (Exit) 85 bpm  85 bpm 98 bpm    Oxygen Saturation (Admit) 91 %  6l/m continuous  91 %  6l/m continuous 93 %    Oxygen Saturation (Exercise) 86 %  86 % 90 %    Oxygen Saturation (Exit) 97 %  97 % 94 %    Rating of Perceived Exertion (Exercise)  13  13 12     Perceived Dyspnea (Exercise) 4  4 3     Intensity  THRR unchanged      Progression  Continue progressive overload as per policy without signs/symptoms or physical distress.      Resistance Training   Training Prescription Yes Yes Yes Yes    Weight 4 5 4 5     Reps 10-12 10-12 10-12 10-12    Treadmill   MPH 1.5 1.5 1.5 1.8    Grade 0 0 0 0    Minutes 12 15 12 15     NuStep   Level 5  6  6   T5    Watts 50  50 80    Minutes 15  15 15     Recumbant Elliptical   Level 6  6 6     RPM 50  50 50    Minutes    15      02/12/15 1200 02/23/15 1500 03/27/15 0900 04/23/15 1100 05/09/15 1200   Exercise Review   Progression Yes  Yes Yes Yes   Response to Exercise   Blood Pressure (Admit)  134/83 mmHg 146/80 mmHg  Data 03/26/15 140/70 mmHg  Data 03/26/15    Blood Pressure (Exercise)  140/80 mmHg 150/70 mmHg 158/74 mmHg    Blood Pressure (Exit)  138/70 mmHg 148/80 mmHg 142/80 mmHg    Heart Rate (Admit)  101 bpm 84 bpm 84 bpm    Heart Rate (Exercise)  118 bpm 127 bpm 124 bpm    Heart Rate (Exit)  97 bpm 103 bpm 93 bpm    Oxygen Saturation (Admit)  91 % 97 % 92 %    Oxygen Saturation (Exercise)  89 % 88 %  8l/m C 93 %  8l/m C    Oxygen Saturation (Exit)  99 % 96 % 92 %    Rating of Perceived Exertion (Exercise)  13 13 13     Perceived Dyspnea (Exercise)  4 4 4     Symptoms    none None   Comments     Completed the mid program 6MW test and did very well. He improived over 300 feet from when he did his discharge walk in the program previously. These results were reviewed with him and he was very encouraged to hear this.    Frequency   Add 1 additional day to program exercise sessions. Add 1 additional day to program exercise sessions.    Duration   Progress to 50 minutes of aerobic without signs/symptoms of physical distress Progress to 50 minutes of aerobic without signs/symptoms of physical distress Progress to 50 minutes of aerobic without signs/symptoms of physical distress   Intensity   Rest + 30 Rest + 30 Rest + 30   Progression   Continue progressive overload as per policy without signs/symptoms or physical distress. Continue progressive overload as per policy without signs/symptoms or physical distress. Continue progressive overload as per policy without signs/symptoms or physical distress.   Resistance Training   Training Prescription  Yes Yes Yes Yes   Weight  5 5 5 5    Reps   10-12 10-12 10-12 10-12   Interval Training   Interval Training     No   Treadmill   MPH  1.8 1.9 1.9 1.9   Grade  0 0 0 0   Minutes  15 15 15 15    NuStep   Level 7  T5 Nustep 7  T5 Nustep 7  T5 7  T5 7  T5  Watts 60 60 70 70 70   Minutes  15 15 15 15    Recumbant Elliptical   Level  6 7 7 7    RPM  50 60 60 60   Watts   15 15 15    Minutes  15      Home Exercise Plan   Plans to continue exercise at  --  Mr. Feighner plans to attend another 36 sessions per MD order.         Discharge Exercise Prescription (Final Exercise Prescription Changes):     Exercise Prescription Changes - 05/09/15 1200    Exercise Review   Progression Yes   Response to Exercise   Symptoms None   Comments Completed the mid program 6MW test and did very well. He improived over 300 feet from when he did his discharge walk in the program previously. These results were reviewed with him and he was very encouraged to hear this.    Duration Progress to 50 minutes of aerobic without signs/symptoms of physical distress   Intensity Rest + 30   Progression Continue progressive overload as per policy without signs/symptoms or physical distress.   Resistance Training   Training Prescription Yes   Weight 5   Reps 10-12   Interval Training   Interval Training No   Treadmill   MPH 1.9   Grade 0   Minutes 15   NuStep   Level 7  T5   Watts 70   Minutes 15   Recumbant Elliptical   Level 7   RPM 60   Watts 15      Nutrition:  Target Goals: Understanding of nutrition guidelines, daily intake of sodium <154m, cholesterol <2062m calories 30% from fat and 7% or less from saturated fats, daily to have 5 or more servings of fruits and vegetables.  Biometrics:    Nutrition Therapy Plan and Nutrition Goals:     Nutrition Therapy & Goals - 12/21/14 1200    Nutrition Therapy   Diet Instructed patient on a 2000 calorie vegan meal plan, DASH diet principles.  He states that he is following Dr.  EsMaurine MinisterReverse Heart Disease" diet which is vegan and eliminates all oils but admits he is having difficulty following it. Discussed the benefits of a vegan diet but stresssed that have to be careful to include adequate protein.   Fiber 20 grams   Whole Grain Foods 4 servings   Protein 8 ounces/day   Saturated Fats 13 max. grams   Fruits and Vegetables 5 servings/day   Personal Nutrition Goals   Personal Goal #1 To space meals 4-6 hours apart.   Personal Goal #2 Include a protein source at every meal. Refer to list given on meat alternatives. Goal of 75 gms protein/day.   Personal Goal #3 Use my Fitness Pal to record food/beverage intake   Personal Goal #4 In addtions to strategies discussed to help with weight loss, refer to "National Weight Loss Registry" for numerous other healthy strategies that participants have used to help lose and maintain weight loss.        Nutrition Discharge: Rate Your Plate Scores:     Rate Your Plate - 0796/28/3626294  Rate Your Plate Scores   Pre Score 55   Pre Score % 79 %  Due to vegan diet, many of the questions were  N/A      Nutrition Goals Re-Evaluation:   Psychosocial: Target Goals: Acknowledge presence or absence of depression, maximize coping skills, provide  positive support system. Participant is able to verbalize types and ability to use techniques and skills needed for reducing stress and depression.  Initial Review & Psychosocial Screening:   Quality of Life Scores:     Quality of Life - 02/19/15 1130    Quality of Life Scores   Health/Function Post 9.63 %   Socioeconomic Post 14.75 %   Psych/Spiritual Post 12.64 %   Family Post 18.6 %   GLOBAL Post 12.69 %      PHQ-9:     Recent Review Flowsheet Data    Depression screen [facility] 2/9 02/19/2015 02/12/2015 10/31/2014   Decreased Interest 1 1 1    Down, Depressed, Hopeless 1 1 1    PHQ - 2 Score 2 2 2    Altered sleeping 0 0 0   Tired, decreased energy 2 2 3    Change in  appetite 3 3 3    Feeling bad or failure about yourself  2 2 3    Trouble concentrating 1 1 1    Moving slowly or fidgety/restless 2 2 2    Suicidal thoughts 1 1 1    PHQ-9 Score 13 13 15    Difficult doing work/chores Somewhat difficult - Somewhat difficult      Psychosocial Evaluation and Intervention:   Psychosocial Re-Evaluation:   Vocational Rehabilitation: Provide vocational rehab assistance to qualifying candidates.   Vocational Rehab Evaluation & Intervention:   Education: Education Goals: Education classes will be provided on a weekly basis, covering required topics. Participant will state understanding/return demonstration of topics presented.  Learning Barriers/Preferences:   Education Topics: General Nutrition Guidelines/Fats and Fiber: -Group instruction provided by verbal, written material, models and posters to present the general guidelines for heart healthy nutrition. Gives an explanation and review of dietary fats and fiber.      Pulmonary Rehab from 04/30/2015 in [place]   Date  02/05/15   Educator  C. Joneen Caraway, RD   Instruction Review Code  2- meets goals/outcomes      Controlling Sodium/Reading Food Labels: -Group verbal and written material supporting the discussion of sodium use in heart healthy nutrition. Review and explanation with models, verbal and written materials for utilization of the food label.      Pulmonary Rehab from 04/30/2015 in [place]   Date  02/12/15   Educator  C. Rusell, RD   Instruction Review Code  2- meets goals/outcomes      Exercise Physiology & Risk Factors: - Group verbal and written instruction with models to review the exercise physiology of the cardiovascular system and associated critical values. Details cardiovascular disease risk factors and the goals associated with each risk factor.   Aerobic Exercise & Resistance Training: - Gives  group verbal and written discussion on the health impact of inactivity. On the components of aerobic and resistive training programs and the benefits of this training and how to safely progress through these programs.      Pulmonary Rehab from 04/30/2015 in [place]   Date  12/27/14   Educator  SW   Instruction Review Code  2- meets goals/outcomes      Flexibility, Balance, General Exercise Guidelines: - Provides group verbal and written instruction on the benefits of flexibility and balance training programs. Provides general exercise guidelines with specific guidelines to those with heart or lung disease. Demonstration and skill practice provided.      Pulmonary Rehab from 04/30/2015 in [place]  Date  01/24/15   Educator  SW   Instruction Review Code  2- meets goals/outcomes      Stress Management: - Provides group verbal and written instruction about the health risks of elevated stress, cause of high stress, and healthy ways to reduce stress.      Pulmonary Rehab from 04/30/2015 in [place]   Date  01/17/15   Educator  [facility]   Instruction Review Code  2- meets goals/outcomes      Depression: - Provides group verbal and written instruction on the correlation between heart/lung disease and depressed mood, treatment options, and the stigmas associated with seeking treatment.      Pulmonary Rehab from 04/30/2015 in [place]   Date  03/14/15   Educator  Berle Mull, MSW   Instruction Review Code  2- meets goals/outcomes      Anatomy & Physiology of the Heart: - Group verbal and written instruction and models provide basic cardiac anatomy and physiology, with the coronary electrical and arterial systems. Review of: AMI, Angina, Valve disease, Heart Failure, Cardiac Arrhythmia, Pacemakers, and the ICD.   Cardiac Procedures: -  Group verbal and written instruction and models to describe the testing methods done to diagnose heart disease. Reviews the outcomes of the test results. Describes the treatment choices: Medical Management, Angioplasty, or Coronary Bypass Surgery.   Cardiac Medications: - Group verbal and written instruction to review commonly prescribed medications for heart disease. Reviews the medication, class of the drug, and side effects. Includes the steps to properly store meds and maintain the prescription regimen.   Go Sex-Intimacy & Heart Disease, Get SMART - Goal Setting: - Group verbal and written instruction through game format to discuss heart disease and the return to sexual intimacy. Provides group verbal and written material to discuss and apply goal setting through the application of the S.M.A.R.T. Method.   Other Matters of the Heart: - Provides group verbal, written materials and models to describe Heart Failure, Angina, Valve Disease, and Diabetes in the realm of heart disease. Includes description of the disease process and treatment options available to the cardiac patient.   Exercise & Equipment Safety: - Individual verbal instruction and demonstration of equipment use and safety with use of the equipment.      Pulmonary Rehab from 04/30/2015 in [place]   Date  01/15/15   Educator  L. Owens Shark, RRT [facility]]   Instruction Review Code  2- meets goals/outcomes      Infection Prevention: - Provides verbal and written material to individual with discussion of infection control including proper hand washing and proper equipment cleaning during exercise session.      Pulmonary Rehab from 04/30/2015 in [place]   Date  11/03/14   Educator  Jacqulyn Ducking   Instruction Review Code  2- meets goals/outcomes      Falls Prevention: - Provides verbal and written material to individual with discussion of falls  prevention and safety.   Diabetes: - Individual verbal and written instruction to review signs/symptoms of diabetes, desired ranges of glucose level fasting, after meals and with exercise. Advice that pre and post exercise glucose checks will be done for 3 sessions at entry of program.      Pulmonary Rehab from 04/30/2015 in [place]   Date  02/23/15 [facility] your numbers]   Educator  C. Areil Ottey   Instruction Review  Code  2- meets goals/outcomes       Knowledge Questionnaire Score:     Knowledge Questionnaire Score - 02/20/15 1018    Knowledge Questionnaire Score   Post Score -1      Personal Goals and Risk Factors at Admission:     Personal Goals and Risk Factors at Admission - 03/02/15 1130    Personal Goals and Risk Factors on Admission    Weight Management Yes      Personal Goals and Risk Factors Review:      Goals and Risk Factor Review      11/29/14 1130 12/06/14 1130 12/20/14 1248 12/20/14 1548 01/01/15 1234   Weight Management   Goals Progress/Improvement seen     Yes   Comments     ongoing, would like to lose 150 lbs.   Increase Aerobic Exercise and Physical Activity   Goals Progress/Improvement seen   Yes   Yes   Comments  Mr Mckibben has had increases with his exercise goals on the TM, T5, and TM.   continue to make gains on the treadmill   Improve shortness of breath with ADL's   Goals Progress/Improvement seen   Yes   Yes   Comments  In working with Mr Hazelip on his oxygen and O2Sat's, he has been able to increase his exercise goals which help improve his shortness of breath, especially with the correct oxygen flow rates.      Breathing Techniques   Goals Progress/Improvement seen   Yes  Yes Yes   Comments  Mr Yapp works very hard to incorporate PLB with his exercise.  Encouraging Mr Lamar to use PLB with his exercise in [place]. With his low O2Sat's on the TM today, he improved his readings with the PLB. He states he really has  to make himself breath deep. Encouraged him to use PLB  when walking into [place] and at his apartment in the long hallway to elevator. wants to improve, also incorporate meditation   Increase knowledge of respiratory medications   Goals Progress/Improvement seen  Yes       Comments Worked with Mr Wessels and portable oxygen; I had him use the Eclipse which is a Systems developer and used in our gym; He was not able to maintain O2Sat's on the 6l/m intermittent and we switched him to the 6l/m gas portable with continuous flow.  I also gave him a copy of a Pulmonary PPaper which had a great article on portable concentrators.       Stress   Goal     To meet with psychosocial counselor for stress and relaxation information and guidance. To state understanding of performing relaxation techniques and or identifying personal stressors.   Other Goal   Goals Progress/Improvement seen    Yes     Comments   Discussed with Blakeley about a piece of exercise equipment he has avaiable for home use and the benefits of it.       01/08/15 1547 01/15/15 1130 01/19/15 1130 01/24/15 1130 01/24/15 1330   Weight Management   Goals Progress/Improvement seen  No      Comments  Asked  Mr Sellick if he had an appointment with the dietitian yet; he said he did meet with her but continues to struggle with his diet - large portion sizes and snacking late at night; his weight is averaging  340lbs.  He has been on a [employer]  since 2006 and has helped his cardiac health.  Increase Aerobic Exercise and Physical Activity   Goals Progress/Improvement seen    Yes     Comments   Mr Klumpp states his endurance is increasing. 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 He still needs to find a oxygen portable concentrator that will work for this that would cover the time involved.     Understand more about Heart/Pulmonary Disease   Goals Progress/Improvement seen      Yes   Comments     Has  enjoyed all the education classes, has trouble retaining information or recalling information, but he still enjoys the sessions and feels like they are helping him better manage his disease.    Improve shortness of breath with ADL's   Goals Progress/Improvement seen  Yes   Yes Yes   Comments Mr Mahan is managing his shortness of breath in [facility] by managing  his oxygen and adjusting the flow rate to 6l/m with exercise and with some activites at home activities   Mr Chancellor brought his harmonica in to class and played it for the class. We talked about the benefits of playing the harmonica on inspiratory and expiratory muscle strength and PLB technique. Has not seen much improvement in SOB since last month, but is still happy with his progress in the program.    Breathing Techniques   Goals Progress/Improvement seen      Yes   Comments     Uses PLB when his O2 saturation is low or when he feels short of breath. He especially utilizes this during class while exercising, but also uses it around the house and when he is out.    Increase knowledge of respiratory medications   Goals Progress/Improvement seen  Yes       Comments Mr Banwart has a good understanding of his Spiriva; he states that he is not sure "it works", but explained the importance of Spiriva, especially with activity.         01/29/15 1550 03/19/15 1130 03/21/15 1130 03/26/15 1130 04/02/15 0853   Weight Management   Goals Progress/Improvement seen  No      Comments  Mr Hillhouse has maintained his weight at 340-343. He continues with his [place] for his heart disease.      Increase Aerobic Exercise and Physical Activity   Goals Progress/Improvement seen      No   Comments     Has not seen much improvement, however, has been remaining stable which is key for him at the juncture.   Understand more about Heart/Pulmonary Disease   Goals Progress/Improvement seen   Yes Yes     Comments  Mr Canada brought in his PFT and CT results from recent  testing. We discussed his Fev1 and how it related to COPD. I plan to continue reviewing his other results. Mr Mohamed is very open for  new knowledge about lung disease. I continued to educate Mr Heggie on his recent PFT . I explained the FEV1, RV, and DLCO. He is being referred to a pulmonologist which he is very positive about for answers about his lung condition.     Improve shortness of breath with ADL's   Goals Progress/Improvement seen     Yes    Comments    Mr Grimsley has good days and bad days with his breathing. He is adjusting his oxygen with exercise and pacing himself with activites. He is looking forward to consulting with  a pulmonologist for more detail about his shortness of breath and breathing.    Breathing Techniques   Goals Progress/Improvement seen     Yes    Comments    Mr Goodwyn uses PLB, especially on the TM and has maintined acceptable O2Sat with this exercise.    Increase knowledge of respiratory medications   Goals Progress/Improvement seen  Yes   Yes    Comments Discussed with Mr Feagans about use an oral device with his sleep apnea, since he can not tolerate the CPAP masks, he was open to it and gave him a list of dentists in the area who are certiified in the oral devices.   Mr Rokosz is taking his Spiriva daily. So far, the "E" cylinders are his portal system for oxygen.    Diabetes   Goal    --  No problems with his diabete in [facility]    Hypertension   Goal    --  Maintaining acceptable BP at rest wnd with exercise in [facility].    Abnormal Lipids   Goal    --  No problems with cholesterol reading in [facility].    Stress   Goal    --  Mr Syme has met with the counselor in [place]. I have educated him on his  pulmonary test results and on portable concentrators.      04/04/15 1130 04/11/15 1603 04/18/15 1200 04/18/15 1244 04/18/15 1544   Weight Management   Goals Progress/Improvement seen   Yes --  Prediabetes on metformin. Instructed what HBA!c blood work means  since [place] said no one told him to check his blood sugars at home since they do blood work.     Comments   maintaining current weight well.     Increase Aerobic Exercise and Physical Activity   Goals Progress/Improvement seen   Yes Yes     Comments  Mr Bartolo walked on the TM at 2.0 for the entire 77mns and maintained acceptable O2Sats. maintaing current levels with acceptable responses     Understand more about Heart/Pulmonary Disease   Goals Progress/Improvement seen      Yes   Comments     Mr OMastersonhas an appointment with a pulmonologist on 05/24/2015. He is disappointed that the appointment is not sooner, but hopeful he will learn more about his lung disease.   Improve shortness of breath with ADL's   Goals Progress/Improvement seen    Yes     Comments   maintaining SOB symptoms.     Breathing Techniques   Goals Progress/Improvement seen    Yes     Comments   continuing to practice PLB with exertion     Increase knowledge of respiratory medications   Goals Progress/Improvement seen  Yes       Comments Mr OGulashas been approved for a portable concentrator, and [facility] has order the oxygen system.       Diabetes   Goal     Blood glucose control identified by blood glucose values, HgbA1C. Participant verbalizes understanding of the signs/symptoms of hyper/hypo glycemia, proper foot care and importance of medication and nutrition plan for blood glucose control.   Hypertension   Goal   Participant will see blood pressure controlled within the values of 140/950mHg or within value directed by their physician.  still receiving acceptable responses --  Blood pressure is stable today and usually.       04/24/15 0727 05/08/15 0844 05/09/15 1130 05/21/15 1302  Weight Management   Goals Progress/Improvement seen    Yes    Comments    Micheil "I walk by a brownie and gain weight". I told Chick I was impressed after Thanksgiving his weight was down a couple of lbs".     Increase Aerobic  Exercise and Physical Activity   Goals Progress/Improvement seen    Yes Yes    Comments   Mr Mangual improved 123f with his mid 66m. He is now walking on the TM 2.0 with improved O2Sat's on the 8l/m oxygen. He has progressed to L8 on the REL and L7 on the T5 - great progression. " I am able to do a little more and breath easier when I am walking".     Understand more about Heart/Pulmonary Disease   Goals Progress/Improvement seen   Yes  Yes    Comments  Mr OlOchrought in his harmonica and instructed me on the breathing techniques for different sounds. He watched a video on the pulmonary harmonica and recommended the regular harmonica for  Chronic Lung patients.      Increase knowledge of respiratory medications   Goals Progress/Improvement seen   Yes      Comments  Mr OlBankheadcquired a better O2 Concentrator with the increased intermittent flow to 5l/m which works better for him.  He is thinking about going to a musical jam session locally now that he has this portability with his oxygen.       Hypertension   Goal    --  Blood pressure is 140/60 and is stable today.     Abnormal Lipids   Goal Cholesterol controlled with medications as prescribed, with individualized exercise RX and with personalized nutrition plan. Value goals: LDL < 7030mHDL > 49m69marticipant states understanding of desired cholesterol values and following prescriptions.       Stress   Goal --  Mr OlsoCozzolinomuch more relaxed when he comes to class. He has accomplished many positive changes - more understanding of his lung disease, plus has an appointment with a pulmonologist, he has a better portable oxygen system order .             Personal Goals Discharge (Final Personal Goals and Risk Factors Review):      Goals and Risk Factor Review - 05/21/15 1302    Weight Management   Goals Progress/Improvement seen Yes   Comments Kael "I walk by a brownie and gain weight". I told RichInfantas impressed after Thanksgiving  his weight was down a couple of lbs".    Increase Aerobic Exercise and Physical Activity   Goals Progress/Improvement seen  Yes   Comments " I am able to do a little more and breath easier when I am walking".    Understand more about Heart/Pulmonary Disease   Goals Progress/Improvement seen  Yes   Hypertension   Goal --  Blood pressure is 140/60 and is stable today.       ITP Comments:   Comments: Mr. OlseIrish Eldersorts that weight has always been an issue for him "I walk past a brownie and I gain weight but I do like to eat them." I told him I was impressed that his weight was down after Thanksgiving. Blood pressure is stable at 140/60. RichKingsleytaking all his medicines and he reports he went to his heart doctor last week who reported "he said all my numbers look good".

## 2015-05-23 ENCOUNTER — Encounter: Payer: Medicare Other | Admitting: *Deleted

## 2015-05-23 DIAGNOSIS — J42 Unspecified chronic bronchitis: Secondary | ICD-10-CM | POA: Diagnosis not present

## 2015-05-23 DIAGNOSIS — J449 Chronic obstructive pulmonary disease, unspecified: Secondary | ICD-10-CM

## 2015-05-23 NOTE — Progress Notes (Signed)
Daily Session Note  Patient Details  Name: Miguel Palmer MRN: [id number] Date of Birth: Nov 11, 1940 Referring Provider:  [name], MD  Encounter Date: 05/23/2015  Check In:     Session Check In - 05/23/15 1205    Check-In   Staff Present Carson Myrtle, BS, RRT, Respiratory Therapist;Steven Way, BS, ACSM EP-C, Exercise Physiologist;Keondrick Dilks Dillard Essex, MS, ACSM CEP, Exercise Physiologist   ER physicians immediately available to respond to emergencies [facility] immediately available ER MD   Physician(s) Jimmye Norman and Clearnce Hasten   Medication changes reported     No   Fall or balance concerns reported    No   Warm-up and Cool-down Performed on first and last piece of equipment   VAD Patient? No   Pain Assessment   Currently in Pain? No/denies   Multiple Pain Sites No         Goals Met:  Proper associated with RPD/PD & O2 Sat Independence with exercise equipment Using PLB without cueing & demonstrates good technique Exercise tolerated well Strength training completed today  Goals Unmet:  Not Applicable  Goals Comments: Patient completed exercise prescription and all exercise goals during rehab session. The exercise was tolerated well and the patient is progressing in the program. Personal and exercise goals expected to be met in 15 more sessions. Progress on specific individualized goals will be charted in patient's ITP. Upon completion of the program the patient will be comfortable managing exercise goals and progression on their own.    Dr. Emily Filbert is Medical Director for [facility] and [facility].

## 2015-05-25 ENCOUNTER — Encounter: Payer: Medicare Other | Attending: Family Medicine | Admitting: *Deleted

## 2015-05-25 DIAGNOSIS — J449 Chronic obstructive pulmonary disease, unspecified: Secondary | ICD-10-CM | POA: Insufficient documentation

## 2015-05-25 DIAGNOSIS — J42 Unspecified chronic bronchitis: Secondary | ICD-10-CM | POA: Diagnosis present

## 2015-05-25 NOTE — Progress Notes (Signed)
Daily Session Note  Patient Details  Name: Miguel Palmer MRN: [id number] Date of Birth: 02-01-1941 Referring Provider:  [name], MD  Encounter Date: 05/25/2015  Check In:     Session Check In - 05/25/15 1239    Check-In   Staff Present Frederich Cha, RRT, RCP, Respiratory Therapist;Virna Livengood Dillard Essex, MS, ACSM CEP, Exercise Physiologist;Carroll Enterkin, RN, BSN   ER physicians immediately available to respond to emergencies [facility] immediately available ER MD   Physician(s) Clearnce Hasten and Corky Downs   Medication changes reported     No   Fall or balance concerns reported    No   Warm-up and Cool-down Performed on first and last piece of equipment   VAD Patient? No   Pain Assessment   Currently in Pain? No/denies   Multiple Pain Sites No         Goals Met:  Proper associated with RPD/PD & O2 Sat Independence with exercise equipment Using PLB without cueing & demonstrates good technique Exercise tolerated well Strength training completed today  Goals Unmet:  Not Applicable  Goals Comments: Patient completed exercise prescription and all exercise goals during rehab session. The exercise was tolerated well and the patient is progressing in the program. Personal and exercise goals expected to be met in  13 more sessions. Progress on specific individualized goals will be charted in patient's ITP. Upon completion of the program the patient will be comfortable managing exercise goals and progression on their own.    Dr. Emily Filbert is Medical Director for [facility] and [facility].

## 2015-05-28 ENCOUNTER — Encounter: Payer: Medicare Other | Admitting: *Deleted

## 2015-05-28 ENCOUNTER — Encounter: Payer: Self-pay | Admitting: Respiratory Therapy

## 2015-05-28 DIAGNOSIS — G4733 Obstructive sleep apnea (adult) (pediatric): Secondary | ICD-10-CM

## 2015-05-28 DIAGNOSIS — J449 Chronic obstructive pulmonary disease, unspecified: Secondary | ICD-10-CM

## 2015-05-28 DIAGNOSIS — G4730 Sleep apnea, unspecified: Secondary | ICD-10-CM

## 2015-05-28 DIAGNOSIS — J42 Unspecified chronic bronchitis: Secondary | ICD-10-CM | POA: Diagnosis not present

## 2015-05-28 NOTE — Progress Notes (Signed)
Pulmonary Individual Treatment Plan  Patient Details  Name: Miguel Palmer MRN: [id number] Date of Birth: August 02, 1940 Referring Provider:  Dr [name]  Initial Encounter Date: 03/02/2015  Visit Diagnosis: COPD, mild (HCC)  OSA (obstructive sleep apnea)  Patient's Home Medications on Admission:  Current outpatient prescriptions:    aspirin 81 MG tablet, Take 81 mg by mouth every other day., Disp: , Rfl:    co-enzyme Q-10 30 MG capsule, Take 30 mg by mouth 2 (two) times daily., Disp: , Rfl:    dextroamphetamine (DEXEDRINE SPANSULE) 15 MG 24 hr capsule, Take 15 mg by mouth daily., Disp: , Rfl:    gabapentin (NEURONTIN) 400 MG capsule, Take by mouth., Disp: , Rfl:    Ginkgo Biloba 60 MG TABS, Take by mouth., Disp: , Rfl:    Glucosamine Sulfate 1000 MG CAPS, Take by mouth., Disp: , Rfl:    isosorbide mononitrate (IMDUR) 30 MG 24 hr tablet, TAKE 1 TABLET BY MOUTH EVERY DAY, Disp: , Rfl:    metFORMIN (GLUCOPHAGE) 500 MG tablet, TAKE 1 TABLET BY MOUTH DAILY., Disp: , Rfl:    metoprolol succinate (TOPROL-XL) 25 MG 24 hr tablet, TAKE 1 TABLET BY MOUTH EVERY DAY, Disp: , Rfl:    modafinil (PROVIGIL) 200 MG tablet, Take by mouth., Disp: , Rfl:    Multiple Vitamins-Minerals (MULTIVITAMIN & MINERAL PO), Take by mouth., Disp: , Rfl:    niacin (NIASPAN) 1000 MG CR tablet, Take by mouth., Disp: , Rfl:    nitroGLYCERIN (NITROSTAT) 0.4 MG SL tablet, Place under the tongue., Disp: , Rfl:    prasugrel (EFFIENT) 10 MG TABS tablet, TAKE 1 TABLET BY MOUTH EVERY DAY, Disp: , Rfl:    rosuvastatin (CRESTOR) 20 MG tablet, Take by mouth., Disp: , Rfl:    tiotropium (SPIRIVA) 18 MCG inhalation capsule, Place into inhaler and inhale., Disp: , Rfl:   Past Medical History: No past medical history on file.  Tobacco Use: History  Smoking status   Former Smoker -- 2.00 packs/day for 22 years   Types: Cigarettes  Smokeless tobacco   Former Systems developer   Quit date: 09/03/1980     Labs: Recent Review Flowsheet Data    There is no flowsheet data to display.       ADL UCSD:     ADL UCSD      01/03/15 1130 02/16/15 1130 02/16/15 1631   ADL UCSD   ADL Phase Mid Entry Exit   SOB Score total 69 85 85   Rest 1 1 1    Walk 3 3 2    Stairs 4 4 4    Bath 3 3 3    Dress 3 3 3    Shop 5 5 5      05/09/15 1130       ADL UCSD   ADL Phase Mid     SOB Score total 68     Rest 0     Walk 3     Stairs 4     Bath 4     Dress 3     Shop 4         Pulmonary Function Assessment:   Exercise Target Goals:    Exercise Program Goal: Individual exercise prescription set with THRR, safety & activity barriers. Participant demonstrates ability to understand and report RPE using BORG scale, to self-measure pulse accurately, and to acknowledge the importance of the exercise prescription.  Exercise Prescription Goal: Starting with aerobic activity 30 plus minutes a day, 3 days per week for initial  exercise prescription. Provide home exercise prescription and guidelines that participant acknowledges understanding prior to discharge.  Activity Barriers & Risk Stratification:   6 Minute Walk:     6 Minute Walk      01/03/15 1225 02/09/15 1159 02/09/15 1214   6 Minute Walk   Phase Mid Program Discharge    Distance 880 feet 600 feet    Walk Time 6 minutes 5 minutes    Resting HR 105 bpm 102 bpm (p) --  Pulse ox on 6 liters before 6 minute walk 98%.   Resting BP 124/70 mmHg 158/88 mmHg    Max Ex. HR 136 bpm 109 bpm    Max Ex. BP 182/82 mmHg 172/94 mmHg    RPE 17 14    Perceived Dyspnea  6 4 (p) --  Post 6 min pulse ox 88% on 6 liters oxgyen   Symptoms No       05/09/15 1157       6 Minute Walk   Phase Mid Program     Distance 915 feet     Walk Time 6 minutes     Resting HR 88 bpm     Resting BP 148/80 mmHg     Max Ex. HR 143 bpm     Max Ex. BP 164/86 mmHg     RPE 17     Perceived Dyspnea  5     Symptoms No        Initial Exercise  Prescription:   Exercise Prescription Changes:     Exercise Prescription Changes      12/06/14 1200 12/06/14 1500 12/08/14 1400 12/11/14 1200 01/01/15 1200   Exercise Review   Progression Yes Yes  Yes Yes   Response to Exercise   Blood Pressure (Admit) 130/80 mmHg 148/80 mmHg   118/78 mmHg   Blood Pressure (Exercise) 160/90 mmHg 142/74 mmHg   140/68 mmHg   Blood Pressure (Exit) 142/84 mmHg 140/80 mmHg   122/62 mmHg   Heart Rate (Admit) 82 bpm 95 bpm   80 bpm   Heart Rate (Exercise) 118 bpm 118 bpm   108 bpm   Heart Rate (Exit) 84 bpm 95 bpm   85 bpm   Oxygen Saturation (Admit) 92 % 94 %   91 %  6l/m continuous   Oxygen Saturation (Exercise) 88 % 90 %  6l/m continuous   86 %   Oxygen Saturation (Exit) 96 % 97 %   97 %   Rating of Perceived Exertion (Exercise) 13 13   13    Perceived Dyspnea (Exercise) 13 4   4    Intensity THRR unchanged       Progression Continue progressive overload as per policy without signs/symptoms or physical distress.       Resistance Training   Training Prescription Yes    Yes   Weight 2    4   Reps 10-12    10-12   Treadmill   MPH 1.4  1.5 1.5 1.5   Grade 0  0 0 0   Minutes 12  12 12 12    NuStep   Level 3   5 5    Watts 30   45 50   Minutes 15    15   Recumbant Elliptical   Level 2   3 5    RPM 20   60 40   Watts 15       Minutes 10    12     01/15/15 1200 01/17/15 1500 01/22/15 1300  01/31/15 1200 01/31/15 1400   Exercise Review   Progression Yes Yes Yes Yes Yes   Response to Exercise   Blood Pressure (Admit) 118/78 mmHg  118/78 mmHg 140/88 mmHg    Blood Pressure (Exercise) 140/68 mmHg  140/68 mmHg 156/80 mmHg    Blood Pressure (Exit) 122/62 mmHg  122/62 mmHg 118/80 mmHg    Heart Rate (Admit) 80 bpm  80 bpm 88 bpm    Heart Rate (Exercise) 108 bpm  108 bpm 122 bpm    Heart Rate (Exit) 85 bpm  85 bpm 98 bpm    Oxygen Saturation (Admit) 91 %  6l/m continuous  91 %  6l/m continuous 93 %    Oxygen Saturation (Exercise) 86 %  86 % 90 %     Oxygen Saturation (Exit) 97 %  97 % 94 %    Rating of Perceived Exertion (Exercise) 13  13 12     Perceived Dyspnea (Exercise) 4  4 3     Intensity  THRR unchanged      Progression  Continue progressive overload as per policy without signs/symptoms or physical distress.      Resistance Training   Training Prescription Yes Yes Yes Yes    Weight 4 5 4 5     Reps 10-12 10-12 10-12 10-12    Treadmill   MPH 1.5 1.5 1.5 1.8    Grade 0 0 0 0    Minutes 12 15 12 15     NuStep   Level 5  6 6   T5    Watts 50  50 80    Minutes 15  15 15     Recumbant Elliptical   Level 6  6 6     RPM 50  50 50    Minutes    15      02/12/15 1200 02/23/15 1500 03/27/15 0900 04/23/15 1100 05/09/15 1200   Exercise Review   Progression Yes  Yes Yes Yes   Response to Exercise   Blood Pressure (Admit)  134/83 mmHg 146/80 mmHg  Data 03/26/15 140/70 mmHg  Data 03/26/15    Blood Pressure (Exercise)  140/80 mmHg 150/70 mmHg 158/74 mmHg    Blood Pressure (Exit)  138/70 mmHg 148/80 mmHg 142/80 mmHg    Heart Rate (Admit)  101 bpm 84 bpm 84 bpm    Heart Rate (Exercise)  118 bpm 127 bpm 124 bpm    Heart Rate (Exit)  97 bpm 103 bpm 93 bpm    Oxygen Saturation (Admit)  91 % 97 % 92 %    Oxygen Saturation (Exercise)  89 % 88 %  8l/m C 93 %  8l/m C    Oxygen Saturation (Exit)  99 % 96 % 92 %    Rating of Perceived Exertion (Exercise)  13 13 13     Perceived Dyspnea (Exercise)  4 4 4     Symptoms    none None   Comments     Completed the mid program 6MW test and did very well. He improived over 300 feet from when he did his discharge walk in the program previously. These results were reviewed with him and he was very encouraged to hear this.    Frequency   Add 1 additional day to program exercise sessions. Add 1 additional day to program exercise sessions.    Duration   Progress to 50 minutes of aerobic without signs/symptoms of physical distress Progress to 50 minutes of aerobic without signs/symptoms of physical distress  Progress to 50 minutes of  aerobic without signs/symptoms of physical distress   Intensity   Rest + 30 Rest + 30 Rest + 30   Progression   Continue progressive overload as per policy without signs/symptoms or physical distress. Continue progressive overload as per policy without signs/symptoms or physical distress. Continue progressive overload as per policy without signs/symptoms or physical distress.   Resistance Training   Training Prescription  Yes Yes Yes Yes   Weight  5 5 5 5    Reps  10-12 10-12 10-12 10-12   Interval Training   Interval Training     No   Treadmill   MPH  1.8 1.9 1.9 1.9   Grade  0 0 0 0   Minutes  15 15 15 15    NuStep   Level 7  T5 Nustep 7  T5 Nustep 7  T5 7  T5 7  T5   Watts 60 60 70 70 70   Minutes  15 15 15 15    Recumbant Elliptical   Level  6 7 7 7    RPM  50 60 60 60   Watts   15 15 15    Minutes  15      Home Exercise Plan   Plans to continue exercise at  --  Miguel Palmer plans to attend another 36 sessions per MD order.        05/21/15 1621           Exercise Review   Progression Yes       Response to Exercise   Blood Pressure (Admit) 142/76 mmHg       Blood Pressure (Exercise) 154/70 mmHg       Blood Pressure (Exit) 148/80 mmHg       Heart Rate (Admit) 106 bpm       Heart Rate (Exercise) 115 bpm       Heart Rate (Exit) 93 bpm       Oxygen Saturation (Admit) 95 %       Oxygen Saturation (Exercise) 95 %       Oxygen Saturation (Exit) 96 %       Rating of Perceived Exertion (Exercise) 13       Perceived Dyspnea (Exercise) 4       Symptoms None       Comments Completed the mid program 6MW test and did very well. He improived over 300 feet from when he did his discharge walk in the program previously. These results were reviewed with him and he was very encouraged to hear this.        Duration Progress to 50 minutes of aerobic without signs/symptoms of physical distress       Intensity Rest + 30       Progression Continue progressive overload  as per policy without signs/symptoms or physical distress.       Resistance Training   Training Prescription Yes       Weight 5       Reps 10-12       Interval Training   Interval Training No       Treadmill   MPH 1.9       Grade 0       Minutes 15       NuStep   Level 7  T5       Watts 70       Minutes 15       Recumbant Elliptical   Level 7       RPM 60  Watts 15          Discharge Exercise Prescription (Final Exercise Prescription Changes):     Exercise Prescription Changes - 05/21/15 1621    Exercise Review   Progression Yes   Response to Exercise   Blood Pressure (Admit) 142/76 mmHg   Blood Pressure (Exercise) 154/70 mmHg   Blood Pressure (Exit) 148/80 mmHg   Heart Rate (Admit) 106 bpm   Heart Rate (Exercise) 115 bpm   Heart Rate (Exit) 93 bpm   Oxygen Saturation (Admit) 95 %   Oxygen Saturation (Exercise) 95 %   Oxygen Saturation (Exit) 96 %   Rating of Perceived Exertion (Exercise) 13   Perceived Dyspnea (Exercise) 4   Symptoms None   Comments Completed the mid program 6MW test and did very well. He improived over 300 feet from when he did his discharge walk in the program previously. These results were reviewed with him and he was very encouraged to hear this.    Duration Progress to 50 minutes of aerobic without signs/symptoms of physical distress   Intensity Rest + 30   Progression Continue progressive overload as per policy without signs/symptoms or physical distress.   Resistance Training   Training Prescription Yes   Weight 5   Reps 10-12   Interval Training   Interval Training No   Treadmill   MPH 1.9   Grade 0   Minutes 15   NuStep   Level 7  T5   Watts 70   Minutes 15   Recumbant Elliptical   Level 7   RPM 60   Watts 15       Nutrition:  Target Goals: Understanding of nutrition guidelines, daily intake of sodium <1512m, cholesterol <2063m calories 30% from fat and 7% or less from saturated fats, daily to have 5 or more  servings of fruits and vegetables.  Biometrics:    Nutrition Therapy Plan and Nutrition Goals:     Nutrition Therapy & Goals - 12/21/14 1200    Nutrition Therapy   Diet Instructed patient on a 2000 calorie vegan meal plan, DASH diet principles.  He states that he is following Dr. EsMaurine MinisterReverse Heart Disease" diet which is vegan and eliminates all oils but admits he is having difficulty following it. Discussed the benefits of a vegan diet but stresssed that have to be careful to include adequate protein.   Fiber 20 grams   Whole Grain Foods 4 servings   Protein 8 ounces/day   Saturated Fats 13 max. grams   Fruits and Vegetables 5 servings/day   Personal Nutrition Goals   Personal Goal #1 To space meals 4-6 hours apart.   Personal Goal #2 Include a protein source at every meal. Refer to list given on meat alternatives. Goal of 75 gms protein/day.   Personal Goal #3 Use my Fitness Pal to record food/beverage intake   Personal Goal #4 In addtions to strategies discussed to help with weight loss, refer to "National Weight Loss Registry" for numerous other healthy strategies that participants have used to help lose and maintain weight loss.        Nutrition Discharge: Rate Your Plate Scores:     Rate Your Plate - 0731/51/7621607  Rate Your Plate Scores   Pre Score 55   Pre Score % 79 %  Due to vegan diet, many of the questions were  N/A      Psychosocial: Target Goals: Acknowledge presence or absence of depression, maximize coping skills, provide  positive support system. Participant is able to verbalize types and ability to use techniques and skills needed for reducing stress and depression.  Initial Review & Psychosocial Screening:   Quality of Life Scores:     Quality of Life - 02/19/15 1130    Quality of Life Scores   Health/Function Post 9.63 %   Socioeconomic Post 14.75 %   Psych/Spiritual Post 12.64 %   Family Post 18.6 %   GLOBAL Post 12.69 %       PHQ-9:     Recent Review Flowsheet Data    Depression screen [facility] 2/9 02/19/2015 02/12/2015 10/31/2014   Decreased Interest 1 1 1    Down, Depressed, Hopeless 1 1 1    PHQ - 2 Score 2 2 2    Altered sleeping 0 0 0   Tired, decreased energy 2 2 3    Change in appetite 3 3 3    Feeling bad or failure about yourself  2 2 3    Trouble concentrating 1 1 1    Moving slowly or fidgety/restless 2 2 2    Suicidal thoughts 1 1 1    PHQ-9 Score 13 13 15    Difficult doing work/chores Somewhat difficult - Somewhat difficult      Psychosocial Evaluation and Intervention:   Psychosocial Re-Evaluation:  Education: Education Goals: Education classes will be provided on a weekly basis, covering required topics. Participant will state understanding/return demonstration of topics presented.  Learning Barriers/Preferences:   Education Topics: Initial Evaluation Education: - Verbal, written and demonstration of respiratory meds, RPE/PD scales, oximetry and breathing techniques. Instruction on use of nebulizers and MDIs: cleaning and proper use, rinsing mouth with steroid doses and importance of monitoring MDI activations.      Pulmonary Rehab from 05/23/2015 in [place]   Date  10/31/14   Educator  LB   Instruction Review Code  2- meets goals/outcomes      General Nutrition Guidelines/Fats and Fiber: -Group instruction provided by verbal, written material, models and posters to present the general guidelines for heart healthy nutrition. Gives an explanation and review of dietary fats and fiber.      Pulmonary Rehab from 05/23/2015 in [place]   Date  02/05/15   Educator  C. Joneen Caraway, RD   Instruction Review Code  2- meets goals/outcomes      Controlling Sodium/Reading Food Labels: -Group verbal and written material supporting the discussion of sodium use in heart healthy nutrition. Review and explanation with models,  verbal and written materials for utilization of the food label.      Pulmonary Rehab from 05/23/2015 in [place]   Date  02/12/15   Educator  C. Rusell, RD   Instruction Review Code  2- meets goals/outcomes      Exercise Physiology & Risk Factors: - Group verbal and written instruction with models to review the exercise physiology of the cardiovascular system and associated critical values. Details cardiovascular disease risk factors and the goals associated with each risk factor.   Aerobic Exercise & Resistance Training: - Gives group verbal and written discussion on the health impact of inactivity. On the components of aerobic and resistive training programs and the benefits of this training and how to safely progress through these programs.      Pulmonary Rehab from 05/23/2015 in [place]   Date  12/27/14   Educator  SW   Instruction Review Code  2- meets goals/outcomes  Flexibility, Balance, General Exercise Guidelines: - Provides group verbal and written instruction on the benefits of flexibility and balance training programs. Provides general exercise guidelines with specific guidelines to those with heart or lung disease. Demonstration and skill practice provided.      Pulmonary Rehab from 05/23/2015 in [place]   Date  01/24/15   Educator  SW   Instruction Review Code  2- meets goals/outcomes      Stress Management: - Provides group verbal and written instruction about the health risks of elevated stress, cause of high stress, and healthy ways to reduce stress.      Pulmonary Rehab from 05/23/2015 in [place]   Date  01/17/15   Educator  [facility]   Instruction Review Code  2- meets goals/outcomes      Depression: - Provides group verbal and written instruction on the correlation between heart/lung disease and  depressed mood, treatment options, and the stigmas associated with seeking treatment.      Pulmonary Rehab from 05/23/2015 in [place]   Date  03/14/15   Educator  Berle Mull, MSW   Instruction Review Code  2- meets goals/outcomes      Exercise & Equipment Safety: - Individual verbal instruction and demonstration of equipment use and safety with use of the equipment.      Pulmonary Rehab from 05/23/2015 in [place]   Date  01/15/15   Educator  L. Owens Shark, RRT [facility] [place]]   Instruction Review Code  2- meets goals/outcomes      Infection Prevention: - Provides verbal and written material to individual with discussion of infection control including proper hand washing and proper equipment cleaning during exercise session.      Pulmonary Rehab from 05/23/2015 in [place]   Date  11/03/14   Educator  Jacqulyn Ducking   Instruction Review Code  2- meets goals/outcomes      Falls Prevention: - Provides verbal and written material to individual with discussion of falls prevention and safety.   Diabetes: - Individual verbal and written instruction to review signs/symptoms of diabetes, desired ranges of glucose level fasting, after meals and with exercise. Advice that pre and post exercise glucose checks will be done for 3 sessions at entry of program.      Pulmonary Rehab from 05/23/2015 in [place]   Date  02/23/15 [facility] your numbers]   Educator  C. Enterkin   Instruction Review Code  2- meets goals/outcomes      Chronic Lung Diseases: - Group verbal and written instruction to review new updates, new respiratory medications, new advancements in procedures and treatments. Provide informative websites and "800" numbers of self-education.      Pulmonary Rehab from 05/23/2015 in [place]   Date   04/30/15   Educator  LB   Instruction Review Code  2- meets goals/outcomes      Lung Procedures: - Group verbal and written instruction to describe testing methods done to diagnose lung disease. Review the outcome of test results. Describe the treatment choices: Pulmonary Function Tests, ABGs and oximetry.      Pulmonary Rehab from 05/23/2015 in [place]   Date  01/26/15   Educator  [place]   Instruction Review Code  2- meets goals/outcomes      Energy Conservation: - Provide group verbal  and written instruction for methods to conserve energy, plan and organize activities. Instruct on pacing techniques, use of adaptive equipment and posture/positioning to relieve shortness of breath.      Pulmonary Rehab from 05/23/2015 in [place]   Date  02/07/15   Educator  SW   Instruction Review Code  2- meets goals/outcomes      Triggers: - Group verbal and written instruction to review types of environmental controls: home humidity, furnaces, filters, dust mite/pet prevention, HEPA vacuums. To discuss weather changes, air quality and the benefits of nasal washing.      Pulmonary Rehab from 05/23/2015 in [place]   Date  01/01/15   Educator  LB   Instruction Review Code  2- meets goals/outcomes      Exacerbations: - Group verbal and written instruction to provide: warning signs, infection symptoms, calling MD promptly, preventive modes, and value of vaccinations. Review: effective airway clearance, coughing and/or vibration techniques. Create an Sports administrator.   Oxygen: - Individual and group verbal and written instruction on oxygen therapy. Includes supplement oxygen, available portable oxygen systems, continuous and intermittent flow rates, oxygen safety, concentrators, and Medicare reimbursement for oxygen.      Pulmonary Rehab from 05/23/2015 in [place]   Date  10/31/14   Educator  LB   Instruction Review Code  2- meets goals/outcomes      Respiratory Medications: - Group verbal and written instruction to review medications for lung disease. Drug class, frequency, complications, importance of spacers, rinsing mouth after steroid MDI's, and proper cleaning methods for nebulizers.      Pulmonary Rehab from 05/23/2015 in [place]   Date  10/31/14   Educator  LB   Instruction Review Code  2- meets goals/outcomes      AED/CPR: - Group verbal and written instruction with the use of models to demonstrate the basic use of the AED with the basic ABC's of resuscitation.      Pulmonary Rehab from 05/23/2015 in [place]   Date  02/02/15   Educator  CE   Instruction Review Code  2- meets goals/outcomes      Breathing Retraining: - Provides individuals verbal and written instruction on purpose, frequency, and proper technique of diaphragmatic breathing and pursed-lipped breathing. Applies individual practice skills.      Pulmonary Rehab from 05/23/2015 in [place]   Date  10/31/14   Educator  LB   Instruction Review Code  R- Review/reinforce      Anatomy and Physiology of the Lungs: - Group verbal and written instruction with the use of models to provide basic lung anatomy and physiology related to function, structure and complications of lung disease.   Heart Failure: - Group verbal and written instruction on the basics of heart failure: signs/symptoms, treatments, explanation of ejection fraction, enlarged heart and cardiomyopathy.      Pulmonary Rehab from 05/23/2015 in [place]   Date  01/12/15   Educator  Frederich Cha, RRT   Instruction Review Code  2- meets goals/outcomes      Sleep Apnea: - Individual verbal and written instruction to review  Obstructive Sleep Apnea. Review of risk factors, methods for diagnosing and types of masks and machines for OSA.   Anxiety: - Provides group, verbal and written instruction on the correlation between heart/lung disease and anxiety,  treatment options, and management of anxiety.      Pulmonary Rehab from 05/23/2015 in [place]   Date  02/14/15   Educator  Berle Mull, MSW   Instruction Review Code  2- Meets goals/outcomes      Relaxation: - Provides group, verbal and written instruction about the benefits of relaxation for patients with heart/lung disease. Also provides patients with examples of relaxation techniques.      Pulmonary Rehab from 05/23/2015 in [place]   Date  05/23/15   Educator  Lucianne Lei LCSW   Instruction Review Code  2- Meets goals/outcomes      Knowledge Questionnaire Score:     Knowledge Questionnaire Score - 02/20/15 1018    Knowledge Questionnaire Score   Post Score -1      Personal Goals and Risk Factors at Admission:     Personal Goals and Risk Factors at Admission - 03/02/15 1130    Personal Goals and Risk Factors on Admission    Weight Management Yes      Personal Goals and Risk Factors Review:      Goals and Risk Factor Review      12/06/14 1130 12/20/14 1248 12/20/14 1548 01/01/15 1234 01/08/15 1547   Weight Management   Goals Progress/Improvement seen    Yes    Comments    ongoing, would like to lose 150 lbs.    Increase Aerobic Exercise and Physical Activity   Goals Progress/Improvement seen  Yes   Yes    Comments Miguel Palmer has had increases with his exercise goals on the TM, T5, and TM.   continue to make gains on the treadmill    Improve shortness of breath with ADL's   Goals Progress/Improvement seen  Yes   Yes Yes   Comments In working with Miguel Palmer on his oxygen and O2Sat's, he has been able to increase his exercise goals which help improve his  shortness of breath, especially with the correct oxygen flow rates.    Miguel Palmer is managing his shortness of breath in [facility] by managing  his oxygen and adjusting the flow rate to 6l/m with exercise and with some activites at home activities   Breathing Techniques   Goals Progress/Improvement seen  Yes  Yes Yes    Comments Miguel Palmer works very hard to incorporate PLB with his exercise.  Encouraging Miguel Palmer to use PLB with his exercise in [facility]. With his low O2Sat's on the TM today, he improved his readings with the PLB. He states he really has to make himself breath deep. Encouraged him to use PLB  when walking into [place] and at his apartment in the long hallway to elevator. wants to improve, also incorporate meditation    Increase knowledge of respiratory medications   Goals Progress/Improvement seen      Yes   Comments     Miguel Balsam has a good understanding of his Spiriva; he states that he is not sure "it works", but explained the importance of Spiriva, especially with activity.   Stress   Goal    To meet with psychosocial counselor for stress and relaxation information and guidance. To state understanding of performing relaxation techniques and or identifying personal stressors.    Other Goal   Goals Progress/Improvement seen   Yes      Comments  Discussed with [employer] about a piece of exercise equipment he has avaiable for home use and the benefits of  it.        01/15/15 1130 01/19/15 1130 01/24/15 1130 01/24/15 1330 01/29/15 1550   Weight Management   Goals Progress/Improvement seen No       Comments Asked  Miguel Palmer if he had an appointment with the dietitian yet; he said he did meet with her but continues to struggle with his diet - large portion sizes and snacking late at night; his weight is averaging  340lbs.  He has been on a [employer]  since 2006 and has helped his cardiac health.       Increase Aerobic Exercise and Physical Activity   Goals Progress/Improvement seen   Yes      Comments   Miguel Palmer states his endurance is increasing. 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 He still needs to find a oxygen portable concentrator that will work for this that would cover the time involved.      Understand more about Heart/Pulmonary Disease   Goals Progress/Improvement seen     Yes    Comments    Has enjoyed all the education classes, has trouble retaining information or recalling information, but he still enjoys the sessions and feels like they are helping him better manage his disease.     Improve shortness of breath with ADL's   Goals Progress/Improvement seen    Yes Yes    Comments   Miguel Palmer brought his harmonica in to class and played it for the class. We talked about the benefits of playing the harmonica on inspiratory and expiratory muscle strength and PLB technique. Has not seen much improvement in SOB since last month, but is still happy with his progress in the program.     Breathing Techniques   Goals Progress/Improvement seen     Yes    Comments    Uses PLB when his O2 saturation is low or when he feels short of breath. He especially utilizes this during class while exercising, but also uses it around the house and when he is out.     Increase knowledge of respiratory medications   Goals Progress/Improvement seen      Yes   Comments     Discussed with Miguel Palmer about use an oral device with his sleep apnea, since he can not tolerate the CPAP masks, he was open to it and gave him a list of dentists in the area who are certiified in the oral devices.     03/19/15 1130 03/21/15 1130 03/26/15 1130 04/02/15 0853 04/04/15 1130   Weight Management   Goals Progress/Improvement seen No       Comments Miguel Palmer has maintained his weight at 340-343. He continues with his Powers for his heart disease.       Increase Aerobic Exercise and Physical Activity   Goals Progress/Improvement seen     No    Comments    Has not seen  much improvement, however, has been remaining stable which is key for him at the juncture.    Understand more about Heart/Pulmonary Disease   Goals Progress/Improvement seen  Yes Yes      Comments Miguel Palmer brought in his PFT and CT results from recent testing. We discussed his Fev1 and how it related to COPD. I plan to continue reviewing his other results. Miguel Palmer is very open for  new knowledge about lung disease. I continued to educate Miguel Palmer on his recent  PFT . I explained the FEV1, RV, and DLCO. He is being referred to a pulmonologist which he is very positive about for answers about his lung condition.      Improve shortness of breath with ADL's   Goals Progress/Improvement seen    Yes     Comments   Miguel Palmer has good days and bad days with his breathing. He is adjusting his oxygen with exercise and pacing himself with activites. He is looking forward to consulting with a pulmonologist for more detail about his shortness of breath and breathing.     Breathing Techniques   Goals Progress/Improvement seen    Yes     Comments   Miguel Palmer uses PLB, especially on the TM and has maintined acceptable O2Sat with this exercise.     Increase knowledge of respiratory medications   Goals Progress/Improvement seen    Yes  Yes   Comments   Miguel Palmer is taking his Spiriva daily. So far, the "E" cylinders are his portal system for oxygen.  Miguel Palmer has been approved for a portable concentrator, and [facility] has order the oxygen system.   Diabetes   Goal   --  No problems with his diabete in [facility]     Hypertension   Goal   --  Maintaining acceptable BP at rest wnd with exercise in [facility].     Abnormal Lipids   Goal   --  No problems with cholesterol reading in [facility].     Stress   Goal   --  Miguel Palmer has met with the counselor in [place]. I have educated him on his  pulmonary test results and on portable concentrators.       04/11/15 1603 04/18/15 1200 04/18/15 1244 04/18/15 1544 04/24/15  0727   Weight Management   Goals Progress/Improvement seen  Yes --  Prediabetes on metformin. Instructed what HBA!c blood work means since [facility] said no one told him to check his blood sugars at home since they do blood work.      Comments  maintaining current weight well.      Increase Aerobic Exercise and Physical Activity   Goals Progress/Improvement seen  Yes Yes      Comments Miguel Palmer walked on the TM at 2.0 for the entire 57mns and maintained acceptable O2Sats. maintaing current levels with acceptable responses      Understand more about Heart/Pulmonary Disease   Goals Progress/Improvement seen     Yes    Comments    Miguel OFieldenhas an appointment with a pulmonologist on 05/24/2015. He is disappointed that the appointment is not sooner, but hopeful he will learn more about his lung disease.    Improve shortness of breath with ADL's   Goals Progress/Improvement seen   Yes      Comments  maintaining SOB symptoms.      Breathing Techniques   Goals Progress/Improvement seen   Yes      Comments  continuing to practice PLB with exertion      Diabetes   Goal    Blood glucose control identified by blood glucose values, HgbA1C. Participant verbalizes understanding of the signs/symptoms of hyper/hypo glycemia, proper foot care and importance of medication and nutrition plan for blood glucose control.    Hypertension   Goal  Participant will see blood pressure controlled within the values of 140/962mHg or within value directed by their physician.  still receiving acceptable responses --  Blood pressure is stable today and usually.  Abnormal Lipids   Goal     Cholesterol controlled with medications as prescribed, with individualized exercise RX and with personalized nutrition plan. Value goals: LDL < 12m, HDL > 469m Participant states understanding of desired cholesterol values and following prescriptions.   Stress   Goal     --  Miguel OlWinsors much more relaxed when he comes to class. He has  accomplished many positive changes - more understanding of his lung disease, plus has an appointment with a pulmonologist, he has a better portable oxygen system order .        05/08/15 0844 05/09/15 1130 05/21/15 1302 05/21/15 1538     Weight Management   Goals Progress/Improvement seen   Yes     Comments   Rushil "I walk by a brownie and gain weight". I told RiIlya was impressed after Thanksgiving his weight was down a couple of lbs".      Increase Aerobic Exercise and Physical Activity   Goals Progress/Improvement seen   Yes Yes     Comments  Miguel OlWeissbergmproved [id number] his mid [id number] is now walking on the TM 2.0 with improved O2Sat's on the 8l/m oxygen. He has progressed to L8 on the REL and L7 on the T5 - great progression. " I am able to do a little more and breath easier when I am walking".      Understand more about Heart/Pulmonary Disease   Goals Progress/Improvement seen  Yes  Yes     Comments Miguel OlsoArakiught in his harmonica and instructed me on the breathing techniques for different sounds. He watched a video on the pulmonary harmonica and recommended the regular harmonica for  Chronic Lung patients.       Improve shortness of breath with ADL's   Goals Progress/Improvement seen     Yes    Comments    Miguel Pontillo's SOB Questionnaire improved by 7 points - Minimal difference is 5 points. Miguel OlsoCentrellamore aware of his shortness of breath and how it correlates to O2Sat's. He adjusts his oxygen flow to 8l/m with his exercise goals.    Breathing Techniques   Goals Progress/Improvement seen     Yes    Comments    Miguel OlsoMcfarlane good technique with his PLB and is very aware when to use this techniqe with his shortness of breath.    Increase knowledge of respiratory medications   Goals Progress/Improvement seen  Yes       Comments Miguel OlsoWhildenuired a better O2 Concentrator with the increased intermittent flow to 5l/m which works better for him.  He is thinking about going to a musical jam  session locally now that he has this portability with his oxygen.        Diabetes   Goal    --  Miguel OlsoEngram not had any problems with his glucose levels in [facility].    Hypertension   Goal   --  Blood pressure is 140/60 and is stable today.         Personal Goals Discharge (Final Personal Goals and Risk Factors Review):      Goals and Risk Factor Review - 05/21/15 1538    Improve shortness of breath with ADL's   Goals Progress/Improvement seen  Yes   Comments Miguel Ceballos's SOB Questionnaire improved by 7 points - Minimal difference is 5 points. Miguel OlsoBoscheemore aware of his shortness of breath and how it correlates to O2Sat's.  He adjusts his oxygen flow to 8l/m with his exercise goals.   Breathing Techniques   Goals Progress/Improvement seen  Yes   Comments Miguel Lieurance has good technique with his PLB and is very aware when to use this techniqe with his shortness of breath.   Diabetes   Goal --  Miguel Sowash has not had any problems with his glucose levels in [facility].      ITP Comments:   Comments: 30 day note review

## 2015-05-28 NOTE — Progress Notes (Signed)
Daily Session Note  Patient Details  Name: Jodey Burbano MRN: [id number] Date of Birth: [birth year] Referring Provider:  [name], MD  Encounter Date: 05/28/2015  Check In:     Session Check In - 05/28/15 1204    Check-In   Staff Present Carson Myrtle, BS, RRT, Respiratory Therapist;Nolyn Swab, RN, BSN;Steven Way, BS, ACSM EP-C, Exercise Physiologist   ER physicians immediately available to respond to emergencies [facility] immediately available ER MD   Physician(s) Dr. Jimmye Norman and Dr. Corky Downs   Medication changes reported     No   Fall or balance concerns reported    No   Warm-up and Cool-down Performed on first and last piece of equipment   VAD Patient? No   Pain Assessment   Currently in Pain? Yes   Pain Score 4    Pain Location Back   Pain Orientation Lower   Pain Type Chronic pain   Pain Onset More than a month ago   Pain Frequency Intermittent         Goals Met:  Proper associated with RPD/PD & O2 Sat Exercise tolerated well  Goals Unmet:  Not Applicable  Goals Comments:    Dr. Emily Filbert is Medical Director for [facility] and [facility].

## 2015-06-01 ENCOUNTER — Encounter: Payer: Medicare Other | Admitting: *Deleted

## 2015-06-01 DIAGNOSIS — J42 Unspecified chronic bronchitis: Secondary | ICD-10-CM | POA: Diagnosis not present

## 2015-06-01 DIAGNOSIS — J449 Chronic obstructive pulmonary disease, unspecified: Secondary | ICD-10-CM

## 2015-06-01 NOTE — Progress Notes (Signed)
Daily Session Note  Patient Details  Name: Miguel Palmer MRN: [id number] Date of Birth: Apr 16, 1941 Referring Provider:  [name], MD  Encounter Date: 06/01/2015  Check In:     Session Check In - 06/01/15 1212    Check-In   Staff Present Gerlene Burdock, RN, Drusilla Kanner, MS, ACSM CEP, Exercise Physiologist;Stacey Blanch Media, RRT, RCP, Respiratory Therapist   ER physicians immediately available to respond to emergencies [facility] immediately available ER MD   Physician(s) Marcelene Butte and Burlene Arnt   Medication changes reported     No   Fall or balance concerns reported    No   Warm-up and Cool-down Performed on first and last piece of equipment   VAD Patient? No   Pain Assessment   Currently in Pain? No/denies   Multiple Pain Sites No         Goals Met:  Proper associated with RPD/PD & O2 Sat Independence with exercise equipment Using PLB without cueing & demonstrates good technique Exercise tolerated well Strength training completed today  Goals Unmet:  Not Applicable  Goals Comments: Patient completed exercise prescription and all exercise goals during rehab session. The exercise was tolerated well and the patient is progressing in the program. Personal and exercise goals expected to be met in 11 more sessions. Progress on specific individualized goals will be charted in patient's ITP. Upon completion of the program the patient will be comfortable managing exercise goals and progression on their own.    Dr. Emily Filbert is Medical Director for [facility] and [facility].

## 2015-06-04 ENCOUNTER — Encounter: Payer: Medicare Other | Admitting: *Deleted

## 2015-06-04 DIAGNOSIS — J42 Unspecified chronic bronchitis: Secondary | ICD-10-CM | POA: Diagnosis not present

## 2015-06-04 DIAGNOSIS — G4730 Sleep apnea, unspecified: Secondary | ICD-10-CM

## 2015-06-04 DIAGNOSIS — J449 Chronic obstructive pulmonary disease, unspecified: Secondary | ICD-10-CM

## 2015-06-04 NOTE — Progress Notes (Signed)
Pulmonary Individual Treatment Plan  Patient Details  Name: Miguel Palmer MRN: [id number] Date of Birth: February 04, 1941 Referring Provider:  [name], MD  Initial Encounter Date:    Visit Diagnosis: COPD, mild ([place])  Sleep apnea  Patient's Home Medications on Admission:  Current outpatient prescriptions:  .  aspirin 81 MG tablet, Take 81 mg by mouth every other day., Disp: , Rfl:  .  co-enzyme Q-10 30 MG capsule, Take 30 mg by mouth 2 (two) times daily., Disp: , Rfl:  .  dextroamphetamine (DEXEDRINE SPANSULE) 15 MG 24 hr capsule, Take 15 mg by mouth daily., Disp: , Rfl:  .  gabapentin (NEURONTIN) 400 MG capsule, Take by mouth., Disp: , Rfl:  .  Ginkgo Biloba 60 MG TABS, Take by mouth., Disp: , Rfl:  .  Glucosamine Sulfate 1000 MG CAPS, Take by mouth., Disp: , Rfl:  .  isosorbide mononitrate (IMDUR) 30 MG 24 hr tablet, TAKE 1 TABLET BY MOUTH EVERY DAY, Disp: , Rfl:  .  metFORMIN (GLUCOPHAGE) 500 MG tablet, TAKE 1 TABLET BY MOUTH DAILY., Disp: , Rfl:  .  metoprolol succinate (TOPROL-XL) 25 MG 24 hr tablet, TAKE 1 TABLET BY MOUTH EVERY DAY, Disp: , Rfl:  .  modafinil (PROVIGIL) 200 MG tablet, Take by mouth., Disp: , Rfl:  .  Multiple Vitamins-Minerals (MULTIVITAMIN & MINERAL PO), Take by mouth., Disp: , Rfl:  .  niacin (NIASPAN) 1000 MG CR tablet, Take by mouth., Disp: , Rfl:  .  nitroGLYCERIN (NITROSTAT) 0.4 MG SL tablet, Place under the tongue., Disp: , Rfl:  .  prasugrel (EFFIENT) 10 MG TABS tablet, TAKE 1 TABLET BY MOUTH EVERY DAY, Disp: , Rfl:  .  rosuvastatin (CRESTOR) 20 MG tablet, Take by mouth., Disp: , Rfl:  .  tiotropium (SPIRIVA) 18 MCG inhalation capsule, Place into inhaler and inhale., Disp: , Rfl:   Past Medical History: No past medical history on file.  Tobacco Use: History  Smoking status  . Former Smoker -- 2.00 packs/day for 22 years  . Types: Cigarettes  Smokeless tobacco  . 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       ADL UCSD:     ADL UCSD      01/03/15 1130 02/16/15 1130 02/16/15 1631   ADL UCSD   ADL Phase Mid Entry Exit   SOB Score total 69 85 85   Rest _0 Walk _1 Stairs _2 Bath _3 Dress _4 Shop _5 05/09/15 1130       ADL UCSD   ADL Phase Mid     SOB Score total 68     Rest 0     Walk 3     Stairs 4     Bath 4     Dress 3     Shop 4         Pulmonary Function Assessment:   Exercise Target Goals:    Exercise Program Goal: Individual exercise prescription set with THRR, safety & activity barriers. Participant demonstrates ability to understand and report RPE using BORG scale, to self-measure pulse accurately, and to acknowledge the importance of the exercise prescription.  Exercise Prescription Goal: Starting with aerobic activity 30 plus minutes a day, 3 days per week for initial exercise  prescription. Provide home exercise prescription and guidelines that participant acknowledges understanding prior to discharge.  Activity Barriers & Risk Stratification:   6 Minute Walk:     6 Minute Walk      01/03/15 1225 02/09/15 1159 02/09/15 1214   6 Minute Walk   Phase Mid Program Discharge    Distance 880 feet 600 feet    Walk Time 6 minutes 5 minutes    Resting HR 105 bpm 102 bpm (p) --  Pulse ox on 6 liters before 6 minute walk 98%.   Resting BP 124/70 mmHg 158/88 mmHg    Max Ex. HR 136 bpm 109 bpm    Max Ex. BP 182/82 mmHg 172/94 mmHg    RPE 17 14    Perceived Dyspnea  6 4 (p) --  Post 6 min pulse ox 88% on 6 liters oxgyen   Symptoms No       05/09/15 1157       6 Minute Walk   Phase Mid Program     Distance 915 feet     Walk Time 6 minutes     Resting HR 88 bpm     Resting BP 148/80 mmHg     Max Ex. HR 143 bpm     Max Ex. BP 164/86 mmHg     RPE 17     Perceived Dyspnea  5     Symptoms No        Initial Exercise Prescription:   Exercise Prescription Changes:      Exercise Prescription Changes      12/06/14 1500 12/08/14 1400 12/11/14 1200 01/01/15 1200 01/15/15 1200   Exercise Review   Progression Yes  Yes Yes Yes   Response to Exercise   Blood Pressure (Admit) 148/80 mmHg   118/78 mmHg 118/78 mmHg   Blood Pressure (Exercise) 142/74 mmHg   140/68 mmHg 140/68 mmHg   Blood Pressure (Exit) 140/80 mmHg   122/62 mmHg 122/62 mmHg   Heart Rate (Admit) 95 bpm   80 bpm 80 bpm   Heart Rate (Exercise) 118 bpm   108 bpm 108 bpm   Heart Rate (Exit) 95 bpm   85 bpm 85 bpm   Oxygen Saturation (Admit) 94 %   91 %  6l/m continuous 91 %  6l/m continuous   Oxygen Saturation (Exercise) 90 %  6l/m continuous   86 % 86 %   Oxygen Saturation (Exit) 97 %   97 % 97 %   Rating of Perceived Exertion (Exercise) _0 Perceived Dyspnea (Exercise) _1 Resistance Training   Training Prescription    Yes Yes   Weight    4 4   Reps    10-12 10-12   Treadmill   MPH  1.5 1.5 1.5 1.5   Grade  0 0 0 0   Minutes  _2 NuStep   Level   _3 Watts   45 50 50   Minutes    15 15   Recumbant Elliptical   Level   _4 RPM   60 40 50   Minutes    12      01/17/15 1500 01/22/15 1300 01/31/15 1200 01/31/15 1400 02/12/15 1200   Exercise Review   Progression _5    Response to Exercise   Blood Pressure (Admit)  118/78 mmHg 140/88 mmHg  Blood Pressure (Exercise)  140/68 mmHg 156/80 mmHg     Blood Pressure (Exit)  122/62 mmHg 118/80 mmHg     Heart Rate (Admit)  80 bpm 88 bpm     Heart Rate (Exercise)  108 bpm 122 bpm     Heart Rate (Exit)  85 bpm 98 bpm     Oxygen Saturation (Admit)  91 %  6l/m continuous 93 %     Oxygen Saturation (Exercise)  86 % 90 %     Oxygen Saturation (Exit)  97 % 94 %     Rating of Perceived Exertion (Exercise)  13 12     Perceived Dyspnea (Exercise)  4 3     Intensity THRR unchanged       Progression Continue progressive overload as per policy without signs/symptoms or physical distress.        Resistance Training   Training Prescription Yes Yes Yes     Weight _0 Reps 10-12 10-12 10-12     Treadmill   MPH 1.5 1.5 1.8     Grade 0 0 0     Minutes _1 NuStep   Level  6 6  T5  7  T5 Nustep   Watts  50 80  60   Minutes  15 15     Recumbant Elliptical   Level  6 6     RPM  50 50     Minutes   15       02/23/15 1500 03/27/15 0900 04/23/15 1100 05/09/15 1200 05/21/15 1621   Exercise Review   Progression  Yes Yes Yes Yes   Response to Exercise   Blood Pressure (Admit) 134/83 mmHg 146/80 mmHg  Data 03/26/15 140/70 mmHg  Data 03/26/15  142/76 mmHg   Blood Pressure (Exercise) 140/80 mmHg 150/70 mmHg 158/74 mmHg  154/70 mmHg   Blood Pressure (Exit) 138/70 mmHg 148/80 mmHg 142/80 mmHg  148/80 mmHg   Heart Rate (Admit) 101 bpm 84 bpm 84 bpm  106 bpm   Heart Rate (Exercise) 118 bpm 127 bpm 124 bpm  115 bpm   Heart Rate (Exit) 97 bpm 103 bpm 93 bpm  93 bpm   Oxygen Saturation (Admit) 91 % 97 % 92 %  95 %   Oxygen Saturation (Exercise) 89 % 88 %  8l/m C 93 %  8l/m C  95 %   Oxygen Saturation (Exit) 99 % 96 % 92 %  96 %   Rating of Perceived Exertion (Exercise) _2 Perceived Dyspnea (Exercise) _3 Symptoms   none None None   Comments    Completed the mid program 6MW test and did very well. He improived over 300 feet from when he did his discharge walk in the program previously. These results were reviewed with him and he was very encouraged to hear this.  Completed the mid program 6MW test and did very well. He improived over 300 feet from when he did his discharge walk in the program previously. These results were reviewed with him and he was very encouraged to hear this.    Frequency  Add 1 additional day to program exercise sessions. Add 1 additional day to program exercise sessions.     Duration  Progress to 50 minutes of aerobic without signs/symptoms of physical distress Progress to 50 minutes of aerobic without signs/symptoms of physical distress  Progress  to 50 minutes of aerobic without signs/symptoms of physical distress Progress to 50 minutes of aerobic without signs/symptoms of physical distress   Intensity  Rest + 30 Rest + 30 Rest + 30 Rest + 30   Progression  Continue progressive overload as per policy without signs/symptoms or physical distress. Continue progressive overload as per policy without signs/symptoms or physical distress. Continue progressive overload as per policy without signs/symptoms or physical distress. Continue progressive overload as per policy without signs/symptoms or physical distress.   Resistance Training   Training Prescription _0    Weight _1 Reps 10-12 10-12 10-12 10-12 10-12   Interval Training   Interval Training    No No   Treadmill   MPH 1.8 1.9 1.9 1.9 1.9   Grade 0 0 0 0 0   Minutes _2 NuStep   Level 7  T5 Nustep 7  T5 7  T5 7  T5 7  T5   Watts 60 70 70 70 70   Minutes _3 Recumbant Elliptical   Level _4 RPM 50 60 60 60 60   Watts  _5 Minutes 15       Home Exercise Plan   Plans to continue exercise at --  Mr. Coven plans to attend another 36 sessions per MD order.          Discharge Exercise Prescription (Final Exercise Prescription Changes):     Exercise Prescription Changes - 05/21/15 1621    Exercise Review   Progression Yes   Response to Exercise   Blood Pressure (Admit) 142/76 mmHg   Blood Pressure (Exercise) 154/70 mmHg   Blood Pressure (Exit) 148/80 mmHg   Heart Rate (Admit) 106 bpm   Heart Rate (Exercise) 115 bpm   Heart Rate (Exit) 93 bpm   Oxygen Saturation (Admit) 95 %   Oxygen Saturation (Exercise) 95 %   Oxygen Saturation (Exit) 96 %   Rating of Perceived Exertion (Exercise) 13   Perceived Dyspnea (Exercise) 4   Symptoms None   Comments Completed the mid program 6MW test and did very well. He improived over 300 feet from when he did his discharge walk in the program previously. These  results were reviewed with him and he was very encouraged to hear this.    Duration Progress to 50 minutes of aerobic without signs/symptoms of physical distress   Intensity Rest + 30   Progression Continue progressive overload as per policy without signs/symptoms or physical distress.   Resistance Training   Training Prescription Yes   Weight 5   Reps 10-12   Interval Training   Interval Training No   Treadmill   MPH 1.9   Grade 0   Minutes 15   NuStep   Level 7  T5   Watts 70   Minutes 15   Recumbant Elliptical   Level 7   RPM 60   Watts 15       Nutrition:  Target Goals: Understanding of nutrition guidelines, daily intake of sodium <1557m, cholesterol <2049m calories 30% from fat and 7% or less from saturated fats, daily to have 5 or more servings of fruits and vegetables.  Biometrics:    Nutrition Therapy Plan and Nutrition Goals:     Nutrition Therapy & Goals - 12/21/14 1200    Nutrition Therapy   Diet  Instructed patient on a 2000 calorie vegan meal plan, DASH diet principles.  He states that he is following Dr. Maurine Minister "Reverse Heart Disease" diet which is vegan and eliminates all oils but admits he is having difficulty following it. Discussed the benefits of a vegan diet but stresssed that have to be careful to include adequate protein.   Fiber 20 grams   Whole Grain Foods 4 servings   Protein 8 ounces/day   Saturated Fats 13 max. grams   Fruits and Vegetables 5 servings/day   Personal Nutrition Goals   Personal Goal #1 To space meals 4-6 hours apart.   Personal Goal #2 Include a protein source at every meal. Refer to list given on meat alternatives. Goal of 75 gms protein/day.   Personal Goal #3 Use my Fitness Pal to record food/beverage intake   Personal Goal #4 In addtions to strategies discussed to help with weight loss, refer to "National Weight Loss Registry" for numerous other healthy strategies that participants have used to help lose and maintain  weight loss.        Nutrition Discharge: Rate Your Plate Scores:     Rate Your Plate - 36/62/94 7654    Rate Your Plate Scores   Pre Score 55   Pre Score % 79 %  Due to vegan diet, many of the questions were  N/A      Psychosocial: Target Goals: Acknowledge presence or absence of depression, maximize coping skills, provide positive support system. Participant is able to verbalize types and ability to use techniques and skills needed for reducing stress and depression.  Initial Review & Psychosocial Screening:   Quality of Life Scores:     Quality of Life - 02/19/15 1130    Quality of Life Scores   Health/Function Post 9.63 %   Socioeconomic Post 14.75 %   Psych/Spiritual Post 12.64 %   Family Post 18.6 %   GLOBAL Post 12.69 %      PHQ-9:     Recent Review Flowsheet Data    Depression screen [facility] 2/9 02/19/2015 02/12/2015 10/31/2014   Decreased Interest _0 Down, Depressed, Hopeless _1 PHQ - 2 Score _2 Altered sleeping 0 0 0   Tired, decreased energy _3 Change in appetite _4 Feeling bad or failure about yourself  _5 Trouble concentrating _6 Moving slowly or fidgety/restless _7 Suicidal thoughts _8 PHQ-9 Score _9 Difficult doing work/chores Somewhat difficult - Somewhat difficult      Psychosocial Evaluation and Intervention:   Psychosocial Re-Evaluation:  Education: Education Goals: Education classes will be provided on a weekly basis, covering required topics. Participant will state understanding/return demonstration of topics presented.  Learning Barriers/Preferences:   Education Topics: Initial Evaluation Education: - Verbal, written and demonstration of respiratory meds, RPE/PD scales, oximetry and breathing techniques. Instruction on use of nebulizers and MDIs: cleaning and proper use, rinsing mouth with steroid doses and importance of monitoring MDI activations.      Pulmonary Rehab from  05/23/2015 in [place]   Date  10/31/14   Educator  LB   Instruction Review Code  2- meets goals/outcomes      General Nutrition Guidelines/Fats and Fiber: -Group instruction provided by verbal, written material, models and posters to present the general guidelines  for heart healthy nutrition. Gives an explanation and review of dietary fats and fiber.      Pulmonary Rehab from 05/23/2015 in [place]   Date  02/05/15   Educator  C. Joneen Caraway, RD   Instruction Review Code  2- meets goals/outcomes      Controlling Sodium/Reading Food Labels: -Group verbal and written material supporting the discussion of sodium use in heart healthy nutrition. Review and explanation with models, verbal and written materials for utilization of the food label.      Pulmonary Rehab from 05/23/2015 in [place]   Date  02/12/15   Educator  C. Rusell, RD   Instruction Review Code  2- meets goals/outcomes      Exercise Physiology & Risk Factors: - Group verbal and written instruction with models to review the exercise physiology of the cardiovascular system and associated critical values. Details cardiovascular disease risk factors and the goals associated with each risk factor.   Aerobic Exercise & Resistance Training: - Gives group verbal and written discussion on the health impact of inactivity. On the components of aerobic and resistive training programs and the benefits of this training and how to safely progress through these programs.      Pulmonary Rehab from 05/23/2015 in [place]   Date  12/27/14   Educator  SW   Instruction Review Code  2- meets goals/outcomes      Flexibility, Balance, General Exercise Guidelines: - Provides group verbal and written instruction on the benefits of flexibility and balance training programs. Provides general  exercise guidelines with specific guidelines to those with heart or lung disease. Demonstration and skill practice provided.      Pulmonary Rehab from 05/23/2015 in [place]   Date  01/24/15   Educator  SW   Instruction Review Code  2- meets goals/outcomes      Stress Management: - Provides group verbal and written instruction about the health risks of elevated stress, cause of high stress, and healthy ways to reduce stress.      Pulmonary Rehab from 05/23/2015 in [place]   Date  01/17/15   Educator  [facility]   Instruction Review Code  2- meets goals/outcomes      Depression: - Provides group verbal and written instruction on the correlation between heart/lung disease and depressed mood, treatment options, and the stigmas associated with seeking treatment.      Pulmonary Rehab from 05/23/2015 in [place]   Date  03/14/15   Educator  Berle Mull, MSW   Instruction Review Code  2- meets goals/outcomes      Exercise & Equipment Safety: - Individual verbal instruction and demonstration of equipment use and safety with use of the equipment.      Pulmonary Rehab from 05/23/2015 in [place]   Date  01/15/15   Educator  L. Owens Shark, RRT [facility]]   Instruction Review Code  2- meets goals/outcomes      Infection Prevention: - Provides verbal and written material to individual with discussion of infection control including proper hand washing and proper equipment cleaning during exercise session.      Pulmonary Rehab from 05/23/2015 in [place]   Date  11/03/14   Educator  Jacqulyn Ducking   Instruction Review Code  2- meets goals/outcomes  Falls Prevention: - Provides verbal and written material to individual with discussion of falls prevention and safety.   Diabetes: - Individual  verbal and written instruction to review signs/symptoms of diabetes, desired ranges of glucose level fasting, after meals and with exercise. Advice that pre and post exercise glucose checks will be done for 3 sessions at entry of program.      Pulmonary Rehab from 05/23/2015 in [place]   Date  02/23/15 [facility] your numbers]   Educator  C. Maritssa Haughton   Instruction Review Code  2- meets goals/outcomes      Chronic Lung Diseases: - Group verbal and written instruction to review new updates, new respiratory medications, new advancements in procedures and treatments. Provide informative websites and "800" numbers of self-education.      Pulmonary Rehab from 05/23/2015 in [place]   Date  04/30/15   Educator  LB   Instruction Review Code  2- meets goals/outcomes      Lung Procedures: - Group verbal and written instruction to describe testing methods done to diagnose lung disease. Review the outcome of test results. Describe the treatment choices: Pulmonary Function Tests, ABGs and oximetry.      Pulmonary Rehab from 05/23/2015 in [place]   Date  01/26/15   Educator  [place]   Instruction Review Code  2- meets goals/outcomes      Energy Conservation: - Provide group verbal and written instruction for methods to conserve energy, plan and organize activities. Instruct on pacing techniques, use of adaptive equipment and posture/positioning to relieve shortness of breath.      Pulmonary Rehab from 05/23/2015 in [place]   Date  02/07/15   Educator  SW   Instruction Review Code  2- meets goals/outcomes      Triggers: - Group verbal and written instruction to review types of environmental controls: home humidity, furnaces, filters, dust mite/pet prevention, HEPA vacuums. To discuss weather changes, air quality and the benefits of nasal  washing.      Pulmonary Rehab from 05/23/2015 in [place]   Date  01/01/15   Educator  LB   Instruction Review Code  2- meets goals/outcomes      Exacerbations: - Group verbal and written instruction to provide: warning signs, infection symptoms, calling MD promptly, preventive modes, and value of vaccinations. Review: effective airway clearance, coughing and/or vibration techniques. Create an Sports administrator.   Oxygen: - Individual and group verbal and written instruction on oxygen therapy. Includes supplement oxygen, available portable oxygen systems, continuous and intermittent flow rates, oxygen safety, concentrators, and Medicare reimbursement for oxygen.      Pulmonary Rehab from 05/23/2015 in [place]   Date  10/31/14   Educator  LB   Instruction Review Code  2- meets goals/outcomes      Respiratory Medications: - Group verbal and written instruction to review medications for lung disease. Drug class, frequency, complications, importance of spacers, rinsing mouth after steroid MDI's, and proper cleaning methods for nebulizers.      Pulmonary Rehab from 05/23/2015 in [place]   Date  10/31/14   Educator  LB   Instruction Review Code  2- meets goals/outcomes      AED/CPR: - Group verbal and written instruction with the use of models to demonstrate the basic use of the AED with the basic ABC's  of resuscitation.      Pulmonary Rehab from 05/23/2015 in [place]   Date  02/02/15   Educator  CE   Instruction Review Code  2- meets goals/outcomes      Breathing Retraining: - Provides individuals verbal and written instruction on purpose, frequency, and proper technique of diaphragmatic breathing and pursed-lipped breathing. Applies individual practice skills.      Pulmonary Rehab from 05/23/2015 in [place]   Date  10/31/14   Educator  LB   Instruction Review Code  R- Review/reinforce      Anatomy and Physiology of the Lungs: - Group verbal and written instruction with the use of models to provide basic lung anatomy and physiology related to function, structure and complications of lung disease.   Heart Failure: - Group verbal and written instruction on the basics of heart failure: signs/symptoms, treatments, explanation of ejection fraction, enlarged heart and cardiomyopathy.      Pulmonary Rehab from 05/23/2015 in [place]   Date  01/12/15   Educator  Frederich Cha, RRT   Instruction Review Code  2- meets goals/outcomes      Sleep Apnea: - Individual verbal and written instruction to review Obstructive Sleep Apnea. Review of risk factors, methods for diagnosing and types of masks and machines for OSA.   Anxiety: - Provides group, verbal and written instruction on the correlation between heart/lung disease and anxiety, treatment options, and management of anxiety.      Pulmonary Rehab from 05/23/2015 in [place]   Date  02/14/15   Educator  Berle Mull, MSW   Instruction Review Code  2- Meets goals/outcomes      Relaxation: - Provides group, verbal and written instruction about the benefits of relaxation for patients with heart/lung disease. Also provides patients with examples of relaxation techniques.      Pulmonary Rehab from 05/23/2015 in [place]   Date  05/23/15   Educator  Lucianne Lei LCSW   Instruction Review Code  2- Meets goals/outcomes      Knowledge Questionnaire Score:     Knowledge Questionnaire Score - 02/20/15 1018    Knowledge Questionnaire Score   Post Score -1      Personal Goals and Risk Factors at Admission:     Personal Goals and Risk Factors at Admission - 03/02/15 1130    Personal Goals and Risk  Factors on Admission    Weight Management Yes      Personal Goals and Risk Factors Review:      Goals and Risk Factor Review      12/20/14 1248 12/20/14 1548 01/01/15 1234 01/08/15 1547 01/15/15 1130   Weight Management   Goals Progress/Improvement seen   Yes  No   Comments   ongoing, would like to lose 150 lbs.  Asked  Mr Kuan if he had an appointment with the dietitian yet; he said he did meet with her but continues to struggle with his diet - large portion sizes and snacking late at night; his weight is averaging  340lbs.  He has been on a [employer]  since 2006 and has helped his cardiac health.   Increase Aerobic Exercise and Physical Activity   Goals Progress/Improvement seen    Yes     Comments   continue to make gains on the treadmill     Improve shortness of breath with ADL's  Goals Progress/Improvement seen    Yes Yes    Comments    Mr Barca is managing his shortness of breath in [facility] by managing  his oxygen and adjusting the flow rate to 6l/m with exercise and with some activites at home activities    Breathing Techniques   Goals Progress/Improvement seen   Yes Yes     Comments  Encouraging Mr Sedore to use PLB with his exercise in [place]. With his low O2Sat's on the TM today, he improved his readings with the PLB. He states he really has to make himself breath deep. Encouraged him to use PLB  when walking into [place] and at his apartment in the long hallway to elevator. wants to improve, also incorporate meditation     Increase knowledge of respiratory medications   Goals Progress/Improvement seen     Yes    Comments    Mr Thain has a good understanding of his Spiriva; he states that he is not sure "it works", but explained the importance of Spiriva, especially with activity.    Stress   Goal   To meet with psychosocial counselor for stress and relaxation information and guidance. To state understanding of performing relaxation techniques and or identifying personal stressors.      Other Goal   Goals Progress/Improvement seen  Yes       Comments Discussed with Mechel about a piece of exercise equipment he has avaiable for home use and the benefits of it.         01/19/15 1130 01/24/15 1130 01/24/15 1330 01/29/15 1550 03/19/15 1130   Weight Management   Goals Progress/Improvement seen     No   Comments     Mr Furtick has maintained his weight at [place]. He continues with his [place] for his heart disease.   Increase Aerobic Exercise and Physical Activity   Goals Progress/Improvement seen  Yes       Comments Mr Mullaly states his endurance is increasing. 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 He still needs to find a oxygen portable concentrator that will work for this that would cover the time involved.       Understand more about Heart/Pulmonary Disease   Goals Progress/Improvement seen    Yes  Yes   Comments   Has enjoyed all the education classes, has trouble retaining information or recalling information, but he still enjoys the sessions and feels like they are helping him better manage his disease.   Mr Furtick brought in his PFT and CT results from recent testing. We discussed his Fev1 and how it related to COPD. I plan to continue reviewing his other results. Mr Penrose is very open for  new knowledge about lung disease.   Improve shortness of breath with ADL's   Goals Progress/Improvement seen   Yes Yes     Comments  Mr Mannan brought his harmonica in to class and played it for the class. We talked about the benefits of playing the harmonica on inspiratory and expiratory muscle strength and PLB technique. Has not seen much improvement in SOB since last month, but is still happy with his progress in the program.      Breathing Techniques   Goals Progress/Improvement seen    Yes     Comments   Uses PLB when his O2 saturation is low or when he feels short of breath. He especially utilizes this during class while  exercising, but also uses it around the house and when he is out.      Increase knowledge of respiratory medications   Goals Progress/Improvement seen     Yes    Comments    Discussed with Mr Sproule about use an oral device with his sleep apnea, since he can not tolerate the CPAP masks, he was open to it and gave him a list of dentists in the area who are certiified in the oral devices.      03/21/15 1130 03/26/15 1130 04/02/15 0853 04/04/15 1130 04/11/15 1603   Increase Aerobic Exercise and Physical Activity   Goals Progress/Improvement seen    No  Yes   Comments   Has not seen much improvement, however, has been remaining stable which is key for him at the juncture.  Mr Lefeber walked on the TM at 2.0 for the entire 42mns and maintained acceptable O2Sats.   Understand more about Heart/Pulmonary Disease   Goals Progress/Improvement seen  Yes       Comments I continued to educate Mr OWieckon his recent PFT . I explained the FEV1, RV, and DLCO. He is being referred to a pulmonologist which he is very positive about for answers about his lung condition.       Improve shortness of breath with ADL's   Goals Progress/Improvement seen   Yes      Comments  Mr OPotvinhas good days and bad days with his breathing. He is adjusting his oxygen with exercise and pacing himself with activites. He is looking forward to consulting with a pulmonologist for more detail about his shortness of breath and breathing.      Breathing Techniques   Goals Progress/Improvement seen   Yes      Comments  Mr OWesbyuses PLB, especially on the TM and has maintined acceptable O2Sat with this exercise.      Increase knowledge of respiratory medications   Goals Progress/Improvement seen   Yes  Yes    Comments  Mr OPurdyis taking his Spiriva daily. So far, the "E" cylinders are his portal system for oxygen.  Mr OSplawnhas been approved for a portable concentrator, and [facility] has order the oxygen system.    Diabetes   Goal  --  No  problems with his diabete in [facility]      Hypertension   Goal  --  Maintaining acceptable BP at rest wnd with exercise in [facility].      Abnormal Lipids   Goal  --  No problems with cholesterol reading in [facility].      Stress   Goal  --  Mr OBoakyehas met with the counselor [place] I have educated him on his  pulmonary test results and on portable concentrators.        04/18/15 1200 04/18/15 1244 04/18/15 1544 04/24/15 0727 05/08/15 0844   Weight Management   Goals Progress/Improvement seen Yes --  Prediabetes on metformin. Instructed what HBA!c blood work means since RF[place]said no one told him to check his blood sugars at home since they do blood work.       Comments maintaining current weight well.       Increase Aerobic Exercise and Physical Activity   Goals Progress/Improvement seen  Yes       Comments maintaing current levels with acceptable responses       Understand more about Heart/Pulmonary Disease   Goals Progress/Improvement seen    Yes  Yes  Comments   Mr Simson has an appointment with a pulmonologist on 05/24/2015. He is disappointed that the appointment is not sooner, but hopeful he will learn more about his lung disease.  Mr Manuele brought in his harmonica and instructed me on the breathing techniques for different sounds. He watched a video on the pulmonary harmonica and recommended the regular harmonica for  Chronic Lung patients.   Improve shortness of breath with ADL's   Goals Progress/Improvement seen  Yes       Comments maintaining SOB symptoms.       Breathing Techniques   Goals Progress/Improvement seen  Yes       Comments continuing to practice PLB with exertion       Increase knowledge of respiratory medications   Goals Progress/Improvement seen      Yes   Comments     Mr Hendriks acquired a better O2 Concentrator with the increased intermittent flow to 5l/m which works better for him.  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    Diabetes   Goal   Blood glucose control identified by blood glucose values, HgbA1C. Participant verbalizes understanding of the signs/symptoms of hyper/hypo glycemia, proper foot care and importance of medication and nutrition plan for blood glucose control.     Hypertension   Goal Participant will see blood pressure controlled within the values of 140/70m/Hg or within value directed by their physician.  still receiving acceptable responses --  Blood pressure is stable today and usually.       Abnormal Lipids   Goal    Cholesterol controlled with medications as prescribed, with individualized exercise RX and with personalized nutrition plan. Value goals: LDL < 78m HDL > 4072mParticipant states understanding of desired cholesterol values and following prescriptions.    Stress   Goal    --  Mr OlsDolinsky much more relaxed when he comes to class. He has accomplished many positive changes - more understanding of his lung disease, plus has an appointment with a pulmonologist, he has a better portable oxygen system order .         05/09/15 1130 05/21/15 1302 05/21/15 1538 06/01/15 1516 06/04/15 1236   Weight Management   Goals Progress/Improvement seen  Yes      Comments  Arash "I walk by a brownie and gain weight". I told RicMakailwas impressed after Thanksgiving his weight was down a couple of lbs".       Increase Aerobic Exercise and Physical Activity   Goals Progress/Improvement seen  Yes Yes      Comments Mr OlsBonebrakeproved [id number] his mid [id number] is now walking on the TM 2.0 with improved O2Sat's on the 8l/m oxygen. He has progressed to L8 on the REL and L7 on the T5 - great progression. " I am able to do a little more and breath easier when I am walking".       Understand more about Heart/Pulmonary Disease   Goals Progress/Improvement seen   Yes   Yes   Comments     RichaCandaceoing for a second opinion with a Pulmonary MD since he wants to  find out more details about an infection the other MD mentioned. Earnest is not running a fever but is "coughing up stuff all the time between "butterscotch color and dark chocolate". Grayden doesn't think he has had a bronchoscopy lately.  Improve shortness of breath with ADL's   Goals Progress/Improvement seen    Yes     Comments   Mr Kaminski's SOB Questionnaire improved by 7 points - Minimal difference is 5 points. Mr Gueye is more aware of his shortness of breath and how it correlates to O2Sat's. He adjusts his oxygen flow to 8l/m with his exercise goals.     Breathing Techniques   Goals Progress/Improvement seen    Yes Yes    Comments   Mr Acton has good technique with his PLB and is very aware when to use this techniqe with his shortness of breath. Mr. Irish Elders is using good technique with PLB.  He is having to use it more often due to increases in workload, but his O2 sats are WNL.    Diabetes   Goal   --  Mr Dorfman has not had any problems with his glucose levels in [facility].     Hypertension   Goal  --  Blood pressure is 140/60 and is stable today.          Personal Goals Discharge (Final Personal Goals and Risk Factors Review):      Goals and Risk Factor Review - 06/04/15 1236    Understand more about Heart/Pulmonary Disease   Goals Progress/Improvement seen  Yes   Comments Darris is going for a second opinion with a Pulmonary MD since he wants to find out more details about an infection the other MD mentioned. Ernest is not running a fever but is "coughing up stuff all the time between "butterscotch color and dark chocolate". Ladarren doesn't think he has had a bronchoscopy lately.       ITP Comments:   Comments: Dillen is going for a second opinion with a Pulmonary MD since he wants to find out more details about an infection the other MD mentioned. Aldridge is not running a fever but is "coughing up stuff all the time between "butterscotch color and dark chocolate". Marcelino  doesn't think he has had a bronchoscopy lately.

## 2015-06-04 NOTE — Progress Notes (Signed)
Pulmonary Individual Treatment Plan  Patient Details  Name: Miguel Palmer MRN: [id number] Date of Birth: February 04, 1941 Referring Provider:  [name], MD  Initial Encounter Date:    Visit Diagnosis: COPD, mild ([place])  Sleep apnea  Patient's Home Medications on Admission:  Current outpatient prescriptions:  .  aspirin 81 MG tablet, Take 81 mg by mouth every other day., Disp: , Rfl:  .  co-enzyme Q-10 30 MG capsule, Take 30 mg by mouth 2 (two) times daily., Disp: , Rfl:  .  dextroamphetamine (DEXEDRINE SPANSULE) 15 MG 24 hr capsule, Take 15 mg by mouth daily., Disp: , Rfl:  .  gabapentin (NEURONTIN) 400 MG capsule, Take by mouth., Disp: , Rfl:  .  Ginkgo Biloba 60 MG TABS, Take by mouth., Disp: , Rfl:  .  Glucosamine Sulfate 1000 MG CAPS, Take by mouth., Disp: , Rfl:  .  isosorbide mononitrate (IMDUR) 30 MG 24 hr tablet, TAKE 1 TABLET BY MOUTH EVERY DAY, Disp: , Rfl:  .  metFORMIN (GLUCOPHAGE) 500 MG tablet, TAKE 1 TABLET BY MOUTH DAILY., Disp: , Rfl:  .  metoprolol succinate (TOPROL-XL) 25 MG 24 hr tablet, TAKE 1 TABLET BY MOUTH EVERY DAY, Disp: , Rfl:  .  modafinil (PROVIGIL) 200 MG tablet, Take by mouth., Disp: , Rfl:  .  Multiple Vitamins-Minerals (MULTIVITAMIN & MINERAL PO), Take by mouth., Disp: , Rfl:  .  niacin (NIASPAN) 1000 MG CR tablet, Take by mouth., Disp: , Rfl:  .  nitroGLYCERIN (NITROSTAT) 0.4 MG SL tablet, Place under the tongue., Disp: , Rfl:  .  prasugrel (EFFIENT) 10 MG TABS tablet, TAKE 1 TABLET BY MOUTH EVERY DAY, Disp: , Rfl:  .  rosuvastatin (CRESTOR) 20 MG tablet, Take by mouth., Disp: , Rfl:  .  tiotropium (SPIRIVA) 18 MCG inhalation capsule, Place into inhaler and inhale., Disp: , Rfl:   Past Medical History: No past medical history on file.  Tobacco Use: History  Smoking status  . Former Smoker -- 2.00 packs/day for 22 years  . Types: Cigarettes  Smokeless tobacco  . 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       ADL UCSD:     ADL UCSD      01/03/15 1130 02/16/15 1130 02/16/15 1631   ADL UCSD   ADL Phase Mid Entry Exit   SOB Score total 69 85 85   Rest _0 Walk _1 Stairs _2 Bath _3 Dress _4 Shop _5 05/09/15 1130       ADL UCSD   ADL Phase Mid     SOB Score total 68     Rest 0     Walk 3     Stairs 4     Bath 4     Dress 3     Shop 4         Pulmonary Function Assessment:   Exercise Target Goals:    Exercise Program Goal: Individual exercise prescription set with THRR, safety & activity barriers. Participant demonstrates ability to understand and report RPE using BORG scale, to self-measure pulse accurately, and to acknowledge the importance of the exercise prescription.  Exercise Prescription Goal: Starting with aerobic activity 30 plus minutes a day, 3 days per week for initial exercise  prescription. Provide home exercise prescription and guidelines that participant acknowledges understanding prior to discharge.  Activity Barriers & Risk Stratification:   6 Minute Walk:     6 Minute Walk      01/03/15 1225 02/09/15 1159 02/09/15 1214   6 Minute Walk   Phase Mid Program Discharge    Distance 880 feet 600 feet    Walk Time 6 minutes 5 minutes    Resting HR 105 bpm 102 bpm (p) --  Pulse ox on 6 liters before 6 minute walk 98%.   Resting BP 124/70 mmHg 158/88 mmHg    Max Ex. HR 136 bpm 109 bpm    Max Ex. BP 182/82 mmHg 172/94 mmHg    RPE 17 14    Perceived Dyspnea  6 4 (p) --  Post 6 min pulse ox 88% on 6 liters oxgyen   Symptoms No       05/09/15 1157       6 Minute Walk   Phase Mid Program     Distance 915 feet     Walk Time 6 minutes     Resting HR 88 bpm     Resting BP 148/80 mmHg     Max Ex. HR 143 bpm     Max Ex. BP 164/86 mmHg     RPE 17     Perceived Dyspnea  5     Symptoms No        Initial Exercise Prescription:   Exercise Prescription Changes:      Exercise Prescription Changes      12/06/14 1500 12/08/14 1400 12/11/14 1200 01/01/15 1200 01/15/15 1200   Exercise Review   Progression Yes  Yes Yes Yes   Response to Exercise   Blood Pressure (Admit) 148/80 mmHg   118/78 mmHg 118/78 mmHg   Blood Pressure (Exercise) 142/74 mmHg   140/68 mmHg 140/68 mmHg   Blood Pressure (Exit) 140/80 mmHg   122/62 mmHg 122/62 mmHg   Heart Rate (Admit) 95 bpm   80 bpm 80 bpm   Heart Rate (Exercise) 118 bpm   108 bpm 108 bpm   Heart Rate (Exit) 95 bpm   85 bpm 85 bpm   Oxygen Saturation (Admit) 94 %   91 %  6l/m continuous 91 %  6l/m continuous   Oxygen Saturation (Exercise) 90 %  6l/m continuous   86 % 86 %   Oxygen Saturation (Exit) 97 %   97 % 97 %   Rating of Perceived Exertion (Exercise) _0 Perceived Dyspnea (Exercise) _1 Resistance Training   Training Prescription    Yes Yes   Weight    4 4   Reps    10-12 10-12   Treadmill   MPH  1.5 1.5 1.5 1.5   Grade  0 0 0 0   Minutes  _2 NuStep   Level   _3 Watts   45 50 50   Minutes    15 15   Recumbant Elliptical   Level   _4 RPM   60 40 50   Minutes    12      01/17/15 1500 01/22/15 1300 01/31/15 1200 01/31/15 1400 02/12/15 1200   Exercise Review   Progression _5    Response to Exercise   Blood Pressure (Admit)  118/78 mmHg 140/88 mmHg  Blood Pressure (Exercise)  140/68 mmHg 156/80 mmHg     Blood Pressure (Exit)  122/62 mmHg 118/80 mmHg     Heart Rate (Admit)  80 bpm 88 bpm     Heart Rate (Exercise)  108 bpm 122 bpm     Heart Rate (Exit)  85 bpm 98 bpm     Oxygen Saturation (Admit)  91 %  6l/m continuous 93 %     Oxygen Saturation (Exercise)  86 % 90 %     Oxygen Saturation (Exit)  97 % 94 %     Rating of Perceived Exertion (Exercise)  13 12     Perceived Dyspnea (Exercise)  4 3     Intensity THRR unchanged       Progression Continue progressive overload as per policy without signs/symptoms or physical distress.        Resistance Training   Training Prescription Yes Yes Yes     Weight _0 Reps 10-12 10-12 10-12     Treadmill   MPH 1.5 1.5 1.8     Grade 0 0 0     Minutes _1 NuStep   Level  6 6  T5  7  T5 Nustep   Watts  50 80  60   Minutes  15 15     Recumbant Elliptical   Level  6 6     RPM  50 50     Minutes   15       02/23/15 1500 03/27/15 0900 04/23/15 1100 05/09/15 1200 05/21/15 1621   Exercise Review   Progression  Yes Yes Yes Yes   Response to Exercise   Blood Pressure (Admit) 134/83 mmHg 146/80 mmHg  Data 03/26/15 140/70 mmHg  Data 03/26/15  142/76 mmHg   Blood Pressure (Exercise) 140/80 mmHg 150/70 mmHg 158/74 mmHg  154/70 mmHg   Blood Pressure (Exit) 138/70 mmHg 148/80 mmHg 142/80 mmHg  148/80 mmHg   Heart Rate (Admit) 101 bpm 84 bpm 84 bpm  106 bpm   Heart Rate (Exercise) 118 bpm 127 bpm 124 bpm  115 bpm   Heart Rate (Exit) 97 bpm 103 bpm 93 bpm  93 bpm   Oxygen Saturation (Admit) 91 % 97 % 92 %  95 %   Oxygen Saturation (Exercise) 89 % 88 %  8l/m C 93 %  8l/m C  95 %   Oxygen Saturation (Exit) 99 % 96 % 92 %  96 %   Rating of Perceived Exertion (Exercise) _2 Perceived Dyspnea (Exercise) _3 Symptoms   none None None   Comments    Completed the mid program 6MW test and did very well. He improived over 300 feet from when he did his discharge walk in the program previously. These results were reviewed with him and he was very encouraged to hear this.  Completed the mid program 6MW test and did very well. He improived over 300 feet from when he did his discharge walk in the program previously. These results were reviewed with him and he was very encouraged to hear this.    Frequency  Add 1 additional day to program exercise sessions. Add 1 additional day to program exercise sessions.     Duration  Progress to 50 minutes of aerobic without signs/symptoms of physical distress Progress to 50 minutes of aerobic without signs/symptoms of physical distress  Progress  to 50 minutes of aerobic without signs/symptoms of physical distress Progress to 50 minutes of aerobic without signs/symptoms of physical distress   Intensity  Rest + 30 Rest + 30 Rest + 30 Rest + 30   Progression  Continue progressive overload as per policy without signs/symptoms or physical distress. Continue progressive overload as per policy without signs/symptoms or physical distress. Continue progressive overload as per policy without signs/symptoms or physical distress. Continue progressive overload as per policy without signs/symptoms or physical distress.   Resistance Training   Training Prescription _0    Weight _1 Reps 10-12 10-12 10-12 10-12 10-12   Interval Training   Interval Training    No No   Treadmill   MPH 1.8 1.9 1.9 1.9 1.9   Grade 0 0 0 0 0   Minutes _2 NuStep   Level 7  T5 Nustep 7  T5 7  T5 7  T5 7  T5   Watts 60 70 70 70 70   Minutes _3 Recumbant Elliptical   Level _4 RPM 50 60 60 60 60   Watts  _5 Minutes 15       Home Exercise Plan   Plans to continue exercise at --  Miguel. Coven plans to attend another 36 sessions per MD order.          Discharge Exercise Prescription (Final Exercise Prescription Changes):     Exercise Prescription Changes - 05/21/15 1621    Exercise Review   Progression Yes   Response to Exercise   Blood Pressure (Admit) 142/76 mmHg   Blood Pressure (Exercise) 154/70 mmHg   Blood Pressure (Exit) 148/80 mmHg   Heart Rate (Admit) 106 bpm   Heart Rate (Exercise) 115 bpm   Heart Rate (Exit) 93 bpm   Oxygen Saturation (Admit) 95 %   Oxygen Saturation (Exercise) 95 %   Oxygen Saturation (Exit) 96 %   Rating of Perceived Exertion (Exercise) 13   Perceived Dyspnea (Exercise) 4   Symptoms None   Comments Completed the mid program 6MW test and did very well. He improived over 300 feet from when he did his discharge walk in the program previously. These  results were reviewed with him and he was very encouraged to hear this.    Duration Progress to 50 minutes of aerobic without signs/symptoms of physical distress   Intensity Rest + 30   Progression Continue progressive overload as per policy without signs/symptoms or physical distress.   Resistance Training   Training Prescription Yes   Weight 5   Reps 10-12   Interval Training   Interval Training No   Treadmill   MPH 1.9   Grade 0   Minutes 15   NuStep   Level 7  T5   Watts 70   Minutes 15   Recumbant Elliptical   Level 7   RPM 60   Watts 15       Nutrition:  Target Goals: Understanding of nutrition guidelines, daily intake of sodium <1557m, cholesterol <2049m calories 30% from fat and 7% or less from saturated fats, daily to have 5 or more servings of fruits and vegetables.  Biometrics:    Nutrition Therapy Plan and Nutrition Goals:     Nutrition Therapy & Goals - 12/21/14 1200    Nutrition Therapy   Diet  Instructed patient on a 2000 calorie vegan meal plan, DASH diet principles.  He states that he is following Dr. Maurine Minister "Reverse Heart Disease" diet which is vegan and eliminates all oils but admits he is having difficulty following it. Discussed the benefits of a vegan diet but stresssed that have to be careful to include adequate protein.   Fiber 20 grams   Whole Grain Foods 4 servings   Protein 8 ounces/day   Saturated Fats 13 max. grams   Fruits and Vegetables 5 servings/day   Personal Nutrition Goals   Personal Goal #1 To space meals 4-6 hours apart.   Personal Goal #2 Include a protein source at every meal. Refer to list given on meat alternatives. Goal of 75 gms protein/day.   Personal Goal #3 Use my Fitness Pal to record food/beverage intake   Personal Goal #4 In addtions to strategies discussed to help with weight loss, refer to "National Weight Loss Registry" for numerous other healthy strategies that participants have used to help lose and maintain  weight loss.        Nutrition Discharge: Rate Your Plate Scores:     Rate Your Plate - 36/62/94 7654    Rate Your Plate Scores   Pre Score 55   Pre Score % 79 %  Due to vegan diet, many of the questions were  N/A      Psychosocial: Target Goals: Acknowledge presence or absence of depression, maximize coping skills, provide positive support system. Participant is able to verbalize types and ability to use techniques and skills needed for reducing stress and depression.  Initial Review & Psychosocial Screening:   Quality of Life Scores:     Quality of Life - 02/19/15 1130    Quality of Life Scores   Health/Function Post 9.63 %   Socioeconomic Post 14.75 %   Psych/Spiritual Post 12.64 %   Family Post 18.6 %   GLOBAL Post 12.69 %      PHQ-9:     Recent Review Flowsheet Data    Depression screen [facility] 2/9 02/19/2015 02/12/2015 10/31/2014   Decreased Interest _0 Down, Depressed, Hopeless _1 PHQ - 2 Score _2 Altered sleeping 0 0 0   Tired, decreased energy _3 Change in appetite _4 Feeling bad or failure about yourself  _5 Trouble concentrating _6 Moving slowly or fidgety/restless _7 Suicidal thoughts _8 PHQ-9 Score _9 Difficult doing work/chores Somewhat difficult - Somewhat difficult      Psychosocial Evaluation and Intervention:   Psychosocial Re-Evaluation:  Education: Education Goals: Education classes will be provided on a weekly basis, covering required topics. Participant will state understanding/return demonstration of topics presented.  Learning Barriers/Preferences:   Education Topics: Initial Evaluation Education: - Verbal, written and demonstration of respiratory meds, RPE/PD scales, oximetry and breathing techniques. Instruction on use of nebulizers and MDIs: cleaning and proper use, rinsing mouth with steroid doses and importance of monitoring MDI activations.      Pulmonary Rehab from  05/23/2015 in [place]   Date  10/31/14   Educator  LB   Instruction Review Code  2- meets goals/outcomes      General Nutrition Guidelines/Fats and Fiber: -Group instruction provided by verbal, written material, models and posters to present the general guidelines  for heart healthy nutrition. Gives an explanation and review of dietary fats and fiber.      Pulmonary Rehab from 05/23/2015 in [place]   Date  02/05/15   Educator  C. Joneen Caraway, RD   Instruction Review Code  2- meets goals/outcomes      Controlling Sodium/Reading Food Labels: -Group verbal and written material supporting the discussion of sodium use in heart healthy nutrition. Review and explanation with models, verbal and written materials for utilization of the food label.      Pulmonary Rehab from 05/23/2015 in [place]   Date  02/12/15   Educator  C. Rusell, RD   Instruction Review Code  2- meets goals/outcomes      Exercise Physiology & Risk Factors: - Group verbal and written instruction with models to review the exercise physiology of the cardiovascular system and associated critical values. Details cardiovascular disease risk factors and the goals associated with each risk factor.   Aerobic Exercise & Resistance Training: - Gives group verbal and written discussion on the health impact of inactivity. On the components of aerobic and resistive training programs and the benefits of this training and how to safely progress through these programs.      Pulmonary Rehab from 05/23/2015 in [place]   Date  12/27/14   Educator  SW   Instruction Review Code  2- meets goals/outcomes      Flexibility, Balance, General Exercise Guidelines: - Provides group verbal and written instruction on the benefits of flexibility and balance training programs. Provides general  exercise guidelines with specific guidelines to those with heart or lung disease. Demonstration and skill practice provided.      Pulmonary Rehab from 05/23/2015 in [place]   Date  01/24/15   Educator  SW   Instruction Review Code  2- meets goals/outcomes      Stress Management: - Provides group verbal and written instruction about the health risks of elevated stress, cause of high stress, and healthy ways to reduce stress.      Pulmonary Rehab from 05/23/2015 in [place]   Date  01/17/15   Educator  [facility]   Instruction Review Code  2- meets goals/outcomes      Depression: - Provides group verbal and written instruction on the correlation between heart/lung disease and depressed mood, treatment options, and the stigmas associated with seeking treatment.      Pulmonary Rehab from 05/23/2015 in [place]   Date  03/14/15   Educator  Berle Mull, MSW   Instruction Review Code  2- meets goals/outcomes      Exercise & Equipment Safety: - Individual verbal instruction and demonstration of equipment use and safety with use of the equipment.      Pulmonary Rehab from 05/23/2015 in [place]   Date  01/15/15   Educator  L. Owens Shark, RRT [facility]]   Instruction Review Code  2- meets goals/outcomes      Infection Prevention: - Provides verbal and written material to individual with discussion of infection control including proper hand washing and proper equipment cleaning during exercise session.      Pulmonary Rehab from 05/23/2015 in [place]   Date  11/03/14   Educator  Jacqulyn Ducking   Instruction Review Code  2- meets goals/outcomes  Falls Prevention: - Provides verbal and written material to individual with discussion of falls prevention and safety.   Diabetes: - Individual  verbal and written instruction to review signs/symptoms of diabetes, desired ranges of glucose level fasting, after meals and with exercise. Advice that pre and post exercise glucose checks will be done for 3 sessions at entry of program.      Pulmonary Rehab from 05/23/2015 in [place]   Date  02/23/15 [facility] your numbers]   Educator  C. Kaylor Simenson   Instruction Review Code  2- meets goals/outcomes      Chronic Lung Diseases: - Group verbal and written instruction to review new updates, new respiratory medications, new advancements in procedures and treatments. Provide informative websites and "800" numbers of self-education.      Pulmonary Rehab from 05/23/2015 in [place]   Date  04/30/15   Educator  LB   Instruction Review Code  2- meets goals/outcomes      Lung Procedures: - Group verbal and written instruction to describe testing methods done to diagnose lung disease. Review the outcome of test results. Describe the treatment choices: Pulmonary Function Tests, ABGs and oximetry.      Pulmonary Rehab from 05/23/2015 in [place]   Date  01/26/15   Educator  [place]   Instruction Review Code  2- meets goals/outcomes      Energy Conservation: - Provide group verbal and written instruction for methods to conserve energy, plan and organize activities. Instruct on pacing techniques, use of adaptive equipment and posture/positioning to relieve shortness of breath.      Pulmonary Rehab from 05/23/2015 in [place]   Date  02/07/15   Educator  SW   Instruction Review Code  2- meets goals/outcomes      Triggers: - Group verbal and written instruction to review types of environmental controls: home humidity, furnaces, filters, dust mite/pet prevention, HEPA vacuums. To discuss weather changes, air quality and the benefits of nasal  washing.      Pulmonary Rehab from 05/23/2015 in [place]   Date  01/01/15   Educator  LB   Instruction Review Code  2- meets goals/outcomes      Exacerbations: - Group verbal and written instruction to provide: warning signs, infection symptoms, calling MD promptly, preventive modes, and value of vaccinations. Review: effective airway clearance, coughing and/or vibration techniques. Create an Sports administrator.   Oxygen: - Individual and group verbal and written instruction on oxygen therapy. Includes supplement oxygen, available portable oxygen systems, continuous and intermittent flow rates, oxygen safety, concentrators, and Medicare reimbursement for oxygen.      Pulmonary Rehab from 05/23/2015 in [place]   Date  10/31/14   Educator  LB   Instruction Review Code  2- meets goals/outcomes      Respiratory Medications: - Group verbal and written instruction to review medications for lung disease. Drug class, frequency, complications, importance of spacers, rinsing mouth after steroid MDI's, and proper cleaning methods for nebulizers.      Pulmonary Rehab from 05/23/2015 in [place]   Date  10/31/14   Educator  LB   Instruction Review Code  2- meets goals/outcomes      AED/CPR: - Group verbal and written instruction with the use of models to demonstrate the basic use of the AED with the basic ABC's  of resuscitation.      Pulmonary Rehab from 05/23/2015 in [place]   Date  02/02/15   Educator  CE   Instruction Review Code  2- meets goals/outcomes      Breathing Retraining: - Provides individuals verbal and written instruction on purpose, frequency, and proper technique of diaphragmatic breathing and pursed-lipped breathing. Applies individual practice skills.      Pulmonary Rehab from 05/23/2015 in [place]   Date  10/31/14   Educator  LB   Instruction Review Code  R- Review/reinforce      Anatomy and Physiology of the Lungs: - Group verbal and written instruction with the use of models to provide basic lung anatomy and physiology related to function, structure and complications of lung disease.   Heart Failure: - Group verbal and written instruction on the basics of heart failure: signs/symptoms, treatments, explanation of ejection fraction, enlarged heart and cardiomyopathy.      Pulmonary Rehab from 05/23/2015 in [place]   Date  01/12/15   Educator  Frederich Cha, RRT   Instruction Review Code  2- meets goals/outcomes      Sleep Apnea: - Individual verbal and written instruction to review Obstructive Sleep Apnea. Review of risk factors, methods for diagnosing and types of masks and machines for OSA.   Anxiety: - Provides group, verbal and written instruction on the correlation between heart/lung disease and anxiety, treatment options, and management of anxiety.      Pulmonary Rehab from 05/23/2015 in [place]   Date  02/14/15   Educator  Berle Mull, MSW   Instruction Review Code  2- Meets goals/outcomes      Relaxation: - Provides group, verbal and written instruction about the benefits of relaxation for patients with heart/lung disease. Also provides patients with examples of relaxation techniques.      Pulmonary Rehab from 05/23/2015 in [place]   Date  05/23/15   Educator  Lucianne Lei LCSW   Instruction Review Code  2- Meets goals/outcomes      Knowledge Questionnaire Score:     Knowledge Questionnaire Score - 02/20/15 1018    Knowledge Questionnaire Score   Post Score -1      Personal Goals and Risk Factors at Admission:     Personal Goals and Risk Factors at Admission - 03/02/15 1130    Personal Goals and Risk  Factors on Admission    Weight Management Yes      Personal Goals and Risk Factors Review:      Goals and Risk Factor Review      12/20/14 1248 12/20/14 1548 01/01/15 1234 01/08/15 1547 01/15/15 1130   Weight Management   Goals Progress/Improvement seen   Yes  No   Comments   ongoing, would like to lose 150 lbs.  Asked  Miguel Palmer if he had an appointment with the dietitian yet; he said he did meet with her but continues to struggle with his diet - large portion sizes and snacking late at night; his weight is averaging  340lbs.  He has been on a [employer]  since 2006 and has helped his cardiac health.   Increase Aerobic Exercise and Physical Activity   Goals Progress/Improvement seen    Yes     Comments   continue to make gains on the treadmill     Improve shortness of breath with ADL's  Goals Progress/Improvement seen    Yes Yes    Comments    Miguel Palmer is managing his shortness of breath in [facility] by managing  his oxygen and adjusting the flow rate to 6l/m with exercise and with some activites at home activities    Breathing Techniques   Goals Progress/Improvement seen   Yes Yes     Comments  Encouraging Miguel Palmer to use PLB with his exercise in [place]. With his low O2Sat's on the TM today, he improved his readings with the PLB. He states he really has to make himself breath deep. Encouraged him to use PLB  when walking into [place] and at his apartment in the long hallway to elevator. wants to improve, also incorporate meditation     Increase knowledge of respiratory medications   Goals Progress/Improvement seen     Yes    Comments    Miguel Palmer has a good understanding of his Spiriva; he states that he is not sure "it works", but explained the importance of Spiriva, especially with activity.    Stress   Goal   To meet with psychosocial counselor for stress and relaxation information and guidance. To state understanding of performing relaxation techniques and or identifying personal stressors.      Other Goal   Goals Progress/Improvement seen  Yes       Comments Discussed with Miguel Palmer about a piece of exercise equipment he has avaiable for home use and the benefits of it.         01/19/15 1130 01/24/15 1130 01/24/15 1330 01/29/15 1550 03/19/15 1130   Weight Management   Goals Progress/Improvement seen     No   Comments     Miguel Palmer has maintained his weight at [place]. He continues with his Amado for his heart disease.   Increase Aerobic Exercise and Physical Activity   Goals Progress/Improvement seen  Yes       Comments Miguel Palmer states his endurance is increasing. 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 He still needs to find a oxygen portable concentrator that will work for this that would cover the time involved.       Understand more about Heart/Pulmonary Disease   Goals Progress/Improvement seen    Yes  Yes   Comments   Has enjoyed all the education classes, has trouble retaining information or recalling information, but he still enjoys the sessions and feels like they are helping him better manage his disease.   Miguel Palmer brought in his PFT and CT results from recent testing. We discussed his Fev1 and how it related to COPD. I plan to continue reviewing his other results. Miguel Palmer is very open for  new knowledge about lung disease.   Improve shortness of breath with ADL's   Goals Progress/Improvement seen   Yes Yes     Comments  Miguel Palmer brought his harmonica in to class and played it for the class. We talked about the benefits of playing the harmonica on inspiratory and expiratory muscle strength and PLB technique. Has not seen much improvement in SOB since last month, but is still happy with his progress in the program.      Breathing Techniques   Goals Progress/Improvement seen    Yes     Comments   Uses PLB when his O2 saturation is low or when he feels short of breath. He especially utilizes this during class while  exercising, but also uses it around the house and when he is out.      Increase knowledge of respiratory medications   Goals Progress/Improvement seen     Yes    Comments    Discussed with Miguel Palmer about use an oral device with his sleep apnea, since he can not tolerate the CPAP masks, he was open to it and gave him a list of dentists in the area who are certiified in the oral devices.      03/21/15 1130 03/26/15 1130 04/02/15 0853 04/04/15 1130 04/11/15 1603   Increase Aerobic Exercise and Physical Activity   Goals Progress/Improvement seen    No  Yes   Comments   Has not seen much improvement, however, has been remaining stable which is key for him at the juncture.  Miguel Palmer walked on the TM at 2.0 for the entire 42mns and maintained acceptable O2Sats.   Understand more about Heart/Pulmonary Disease   Goals Progress/Improvement seen  Yes       Comments I continued to educate Miguel Palmer his recent PFT . I explained the FEV1, RV, and DLCO. He is being referred to a pulmonologist which he is very positive about for answers about his lung condition.       Improve shortness of breath with ADL's   Goals Progress/Improvement seen   Yes      Comments  Miguel Palmer good days and bad days with his breathing. He is adjusting his oxygen with exercise and pacing himself with activites. He is looking forward to consulting with a pulmonologist for more detail about his shortness of breath and breathing.      Breathing Techniques   Goals Progress/Improvement seen   Yes      Comments  Miguel OWesbyuses PLB, especially on the TM and has maintined acceptable O2Sat with this exercise.      Increase knowledge of respiratory medications   Goals Progress/Improvement seen   Yes  Yes    Comments  Miguel Palmer taking his Spiriva daily. So far, the "E" cylinders are his portal system for oxygen.  Miguel Palmer been approved for a portable concentrator, and [facility] has order the oxygen system.    Diabetes   Goal  --  No  problems with his diabete in [facility]      Hypertension   Goal  --  Maintaining acceptable BP at rest wnd with exercise in [facility].      Abnormal Lipids   Goal  --  No problems with cholesterol reading in [facility].      Stress   Goal  --  Miguel OBoakyehas [place] counselor [place] have educated him on his  pulmonary test results and on portable concentrators.        04/18/15 1200 04/18/15 1244 04/18/15 1544 04/24/15 0727 05/08/15 0844   Weight Management   Goals Progress/Improvement seen Yes --  Prediabetes on metformin. Instructed what HBA!c blood work means since [place] no one told him to check his blood sugars at home since they do blood work.       Comments maintaining current weight well.       Increase Aerobic Exercise and Physical Activity   Goals Progress/Improvement seen  Yes       Comments maintaing current levels with acceptable responses       Understand more about Heart/Pulmonary Disease   Goals Progress/Improvement seen    Yes  Yes  Comments   Miguel Urwin has an appointment with a pulmonologist on 05/24/2015. He is disappointed that the appointment is not sooner, but hopeful he will learn more about his lung disease.  Miguel Hoffmann brought in his harmonica and instructed me on the breathing techniques for different sounds. He watched a video on the pulmonary harmonica and recommended the regular harmonica for  Chronic Lung patients.   Improve shortness of breath with ADL's   Goals Progress/Improvement seen  Yes       Comments maintaining SOB symptoms.       Breathing Techniques   Goals Progress/Improvement seen  Yes       Comments continuing to practice PLB with exertion       Increase knowledge of respiratory medications   Goals Progress/Improvement seen      Yes   Comments     Miguel Villella acquired a better O2 Concentrator with the increased intermittent flow to 5l/m which works better for him.  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    Diabetes   Goal   Blood glucose control identified by blood glucose values, HgbA1C. Participant verbalizes understanding of the signs/symptoms of hyper/hypo glycemia, proper foot care and importance of medication and nutrition plan for blood glucose control.     Hypertension   Goal Participant will see blood pressure controlled within the values of 140/66m/Hg or within value directed by their physician.  still receiving acceptable responses --  Blood pressure is stable today and usually.       Abnormal Lipids   Goal    Cholesterol controlled with medications as prescribed, with individualized exercise RX and with personalized nutrition plan. Value goals: LDL < 778m HDL > 4064mParticipant states understanding of desired cholesterol values and following prescriptions.    Stress   Goal    --  Miguel OlsSchaab much more relaxed when he comes to class. He has accomplished many positive changes - more understanding of his lung disease, plus has an appointment with a pulmonologist, he has a better portable oxygen system order .         05/09/15 1130 05/21/15 1302 05/21/15 1538 06/01/15 1516     Weight Management   Goals Progress/Improvement seen  Yes      Comments  Aryn "I walk by a brownie and gain weight". I told RicKaedonwas impressed after Thanksgiving his weight was down a couple of lbs".       Increase Aerobic Exercise and Physical Activity   Goals Progress/Improvement seen  Yes Yes      Comments Miguel OlsBlessproved [id number] his mid [id number] is now walking on the TM 2.0 with improved O2Sat's on the 8l/m oxygen. He has progressed to L8 on the REL and L7 on the T5 - great progression. " I am able to do a little more and breath easier when I am walking".       Understand more about Heart/Pulmonary Disease   Goals Progress/Improvement seen   Yes      Improve shortness of breath with ADL's   Goals Progress/Improvement seen    Yes     Comments   Miguel  Blakeney's SOB Questionnaire improved by 7 points - Minimal difference is 5 points. Miguel OlsonWoodinore aware of his shortness of breath and how it correlates to O2Sat's. He adjusts his oxygen flow to 8l/m with his exercise goals.  Breathing Techniques   Goals Progress/Improvement seen    Yes Yes    Comments   Miguel Tomko has good technique with his PLB and is very aware when to use this techniqe with his shortness of breath. Miguel. Irish Elders is using good technique with PLB.  He is having to use it more often due to increases in workload, but his O2 sats are WNL.    Diabetes   Goal   --  Miguel Fray has not had any problems with his glucose levels in [facility].     Hypertension   Goal  --  Blood pressure is 140/60 and is stable today.          Personal Goals Discharge (Final Personal Goals and Risk Factors Review):      Goals and Risk Factor Review - 06/01/15 1516    Breathing Techniques   Goals Progress/Improvement seen  Yes   Comments Miguel. Irish Elders is using good technique with PLB.  He is having to use it more often due to increases in workload, but his O2 sats are WNL.      ITP Comments:   Comments: Miguel Palmer is going for a second opinion with a Pulmonary MD since he wants to find out more details about an infection the other MD mentioned. Miguel Palmer is not running a fever but is "coughing up stuff all the time between "butterscotch color and dark chocolate". Miguel Palmer doesn't think he has had a bronchoscopy lately.

## 2015-06-04 NOTE — Progress Notes (Signed)
Pulmonary Individual Treatment Plan  Patient Details  Name: Miguel Palmer MRN: [id number] Date of Birth: February 04, 1941 Referring Provider:  [name], MD  Initial Encounter Date:    Visit Diagnosis: COPD, mild ([place])  Sleep apnea  Patient's Home Medications on Admission:  Current outpatient prescriptions:  .  aspirin 81 MG tablet, Take 81 mg by mouth every other day., Disp: , Rfl:  .  co-enzyme Q-10 30 MG capsule, Take 30 mg by mouth 2 (two) times daily., Disp: , Rfl:  .  dextroamphetamine (DEXEDRINE SPANSULE) 15 MG 24 hr capsule, Take 15 mg by mouth daily., Disp: , Rfl:  .  gabapentin (NEURONTIN) 400 MG capsule, Take by mouth., Disp: , Rfl:  .  Ginkgo Biloba 60 MG TABS, Take by mouth., Disp: , Rfl:  .  Glucosamine Sulfate 1000 MG CAPS, Take by mouth., Disp: , Rfl:  .  isosorbide mononitrate (IMDUR) 30 MG 24 hr tablet, TAKE 1 TABLET BY MOUTH EVERY DAY, Disp: , Rfl:  .  metFORMIN (GLUCOPHAGE) 500 MG tablet, TAKE 1 TABLET BY MOUTH DAILY., Disp: , Rfl:  .  metoprolol succinate (TOPROL-XL) 25 MG 24 hr tablet, TAKE 1 TABLET BY MOUTH EVERY DAY, Disp: , Rfl:  .  modafinil (PROVIGIL) 200 MG tablet, Take by mouth., Disp: , Rfl:  .  Multiple Vitamins-Minerals (MULTIVITAMIN & MINERAL PO), Take by mouth., Disp: , Rfl:  .  niacin (NIASPAN) 1000 MG CR tablet, Take by mouth., Disp: , Rfl:  .  nitroGLYCERIN (NITROSTAT) 0.4 MG SL tablet, Place under the tongue., Disp: , Rfl:  .  prasugrel (EFFIENT) 10 MG TABS tablet, TAKE 1 TABLET BY MOUTH EVERY DAY, Disp: , Rfl:  .  rosuvastatin (CRESTOR) 20 MG tablet, Take by mouth., Disp: , Rfl:  .  tiotropium (SPIRIVA) 18 MCG inhalation capsule, Place into inhaler and inhale., Disp: , Rfl:   Past Medical History: No past medical history on file.  Tobacco Use: History  Smoking status  . Former Smoker -- 2.00 packs/day for 22 years  . Types: Cigarettes  Smokeless tobacco  . 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       ADL UCSD:     ADL UCSD      01/03/15 1130 02/16/15 1130 02/16/15 1631   ADL UCSD   ADL Phase Mid Entry Exit   SOB Score total 69 85 85   Rest _0 Walk _1 Stairs _2 Bath _3 Dress _4 Shop _5 05/09/15 1130       ADL UCSD   ADL Phase Mid     SOB Score total 68     Rest 0     Walk 3     Stairs 4     Bath 4     Dress 3     Shop 4         Pulmonary Function Assessment:   Exercise Target Goals:    Exercise Program Goal: Individual exercise prescription set with THRR, safety & activity barriers. Participant demonstrates ability to understand and report RPE using BORG scale, to self-measure pulse accurately, and to acknowledge the importance of the exercise prescription.  Exercise Prescription Goal: Starting with aerobic activity 30 plus minutes a day, 3 days per week for initial exercise  prescription. Provide home exercise prescription and guidelines that participant acknowledges understanding prior to discharge.  Activity Barriers & Risk Stratification:   6 Minute Walk:     6 Minute Walk      01/03/15 1225 02/09/15 1159 02/09/15 1214   6 Minute Walk   Phase Mid Program Discharge    Distance 880 feet 600 feet    Walk Time 6 minutes 5 minutes    Resting HR 105 bpm 102 bpm (p) --  Pulse ox on 6 liters before 6 minute walk 98%.   Resting BP 124/70 mmHg 158/88 mmHg    Max Ex. HR 136 bpm 109 bpm    Max Ex. BP 182/82 mmHg 172/94 mmHg    RPE 17 14    Perceived Dyspnea  6 4 (p) --  Post 6 min pulse ox 88% on 6 liters oxgyen   Symptoms No       05/09/15 1157       6 Minute Walk   Phase Mid Program     Distance 915 feet     Walk Time 6 minutes     Resting HR 88 bpm     Resting BP 148/80 mmHg     Max Ex. HR 143 bpm     Max Ex. BP 164/86 mmHg     RPE 17     Perceived Dyspnea  5     Symptoms No        Initial Exercise Prescription:   Exercise Prescription Changes:      Exercise Prescription Changes      12/06/14 1500 12/08/14 1400 12/11/14 1200 01/01/15 1200 01/15/15 1200   Exercise Review   Progression Yes  Yes Yes Yes   Response to Exercise   Blood Pressure (Admit) 148/80 mmHg   118/78 mmHg 118/78 mmHg   Blood Pressure (Exercise) 142/74 mmHg   140/68 mmHg 140/68 mmHg   Blood Pressure (Exit) 140/80 mmHg   122/62 mmHg 122/62 mmHg   Heart Rate (Admit) 95 bpm   80 bpm 80 bpm   Heart Rate (Exercise) 118 bpm   108 bpm 108 bpm   Heart Rate (Exit) 95 bpm   85 bpm 85 bpm   Oxygen Saturation (Admit) 94 %   91 %  6l/m continuous 91 %  6l/m continuous   Oxygen Saturation (Exercise) 90 %  6l/m continuous   86 % 86 %   Oxygen Saturation (Exit) 97 %   97 % 97 %   Rating of Perceived Exertion (Exercise) _0 Perceived Dyspnea (Exercise) _1 Resistance Training   Training Prescription    Yes Yes   Weight    4 4   Reps    10-12 10-12   Treadmill   MPH  1.5 1.5 1.5 1.5   Grade  0 0 0 0   Minutes  _2 NuStep   Level   _3 Watts   45 50 50   Minutes    15 15   Recumbant Elliptical   Level   _4 RPM   60 40 50   Minutes    12      01/17/15 1500 01/22/15 1300 01/31/15 1200 01/31/15 1400 02/12/15 1200   Exercise Review   Progression _5    Response to Exercise   Blood Pressure (Admit)  118/78 mmHg 140/88 mmHg  Blood Pressure (Exercise)  140/68 mmHg 156/80 mmHg     Blood Pressure (Exit)  122/62 mmHg 118/80 mmHg     Heart Rate (Admit)  80 bpm 88 bpm     Heart Rate (Exercise)  108 bpm 122 bpm     Heart Rate (Exit)  85 bpm 98 bpm     Oxygen Saturation (Admit)  91 %  6l/m continuous 93 %     Oxygen Saturation (Exercise)  86 % 90 %     Oxygen Saturation (Exit)  97 % 94 %     Rating of Perceived Exertion (Exercise)  13 12     Perceived Dyspnea (Exercise)  4 3     Intensity THRR unchanged       Progression Continue progressive overload as per policy without signs/symptoms or physical distress.        Resistance Training   Training Prescription Yes Yes Yes     Weight _0 Reps 10-12 10-12 10-12     Treadmill   MPH 1.5 1.5 1.8     Grade 0 0 0     Minutes _1 NuStep   Level  6 6  T5  7  T5 Nustep   Watts  50 80  60   Minutes  15 15     Recumbant Elliptical   Level  6 6     RPM  50 50     Minutes   15       02/23/15 1500 03/27/15 0900 04/23/15 1100 05/09/15 1200 05/21/15 1621   Exercise Review   Progression  Yes Yes Yes Yes   Response to Exercise   Blood Pressure (Admit) 134/83 mmHg 146/80 mmHg  Data 03/26/15 140/70 mmHg  Data 03/26/15  142/76 mmHg   Blood Pressure (Exercise) 140/80 mmHg 150/70 mmHg 158/74 mmHg  154/70 mmHg   Blood Pressure (Exit) 138/70 mmHg 148/80 mmHg 142/80 mmHg  148/80 mmHg   Heart Rate (Admit) 101 bpm 84 bpm 84 bpm  106 bpm   Heart Rate (Exercise) 118 bpm 127 bpm 124 bpm  115 bpm   Heart Rate (Exit) 97 bpm 103 bpm 93 bpm  93 bpm   Oxygen Saturation (Admit) 91 % 97 % 92 %  95 %   Oxygen Saturation (Exercise) 89 % 88 %  8l/m C 93 %  8l/m C  95 %   Oxygen Saturation (Exit) 99 % 96 % 92 %  96 %   Rating of Perceived Exertion (Exercise) _2 Perceived Dyspnea (Exercise) _3 Symptoms   none None None   Comments    Completed the mid program 6MW test and did very well. He improived over 300 feet from when he did his discharge walk in the program previously. These results were reviewed with him and he was very encouraged to hear this.  Completed the mid program 6MW test and did very well. He improived over 300 feet from when he did his discharge walk in the program previously. These results were reviewed with him and he was very encouraged to hear this.    Frequency  Add 1 additional day to program exercise sessions. Add 1 additional day to program exercise sessions.     Duration  Progress to 50 minutes of aerobic without signs/symptoms of physical distress Progress to 50 minutes of aerobic without signs/symptoms of physical distress  Progress  to 50 minutes of aerobic without signs/symptoms of physical distress Progress to 50 minutes of aerobic without signs/symptoms of physical distress   Intensity  Rest + 30 Rest + 30 Rest + 30 Rest + 30   Progression  Continue progressive overload as per policy without signs/symptoms or physical distress. Continue progressive overload as per policy without signs/symptoms or physical distress. Continue progressive overload as per policy without signs/symptoms or physical distress. Continue progressive overload as per policy without signs/symptoms or physical distress.   Resistance Training   Training Prescription _0    Weight _1 Reps 10-12 10-12 10-12 10-12 10-12   Interval Training   Interval Training    No No   Treadmill   MPH 1.8 1.9 1.9 1.9 1.9   Grade 0 0 0 0 0   Minutes _2 NuStep   Level 7  T5 Nustep 7  T5 7  T5 7  T5 7  T5   Watts 60 70 70 70 70   Minutes _3 Recumbant Elliptical   Level _4 RPM 50 60 60 60 60   Watts  _5 Minutes 15       Home Exercise Plan   Plans to continue exercise at --  Mr. Coven plans to attend another 36 sessions per MD order.          Discharge Exercise Prescription (Final Exercise Prescription Changes):     Exercise Prescription Changes - 05/21/15 1621    Exercise Review   Progression Yes   Response to Exercise   Blood Pressure (Admit) 142/76 mmHg   Blood Pressure (Exercise) 154/70 mmHg   Blood Pressure (Exit) 148/80 mmHg   Heart Rate (Admit) 106 bpm   Heart Rate (Exercise) 115 bpm   Heart Rate (Exit) 93 bpm   Oxygen Saturation (Admit) 95 %   Oxygen Saturation (Exercise) 95 %   Oxygen Saturation (Exit) 96 %   Rating of Perceived Exertion (Exercise) 13   Perceived Dyspnea (Exercise) 4   Symptoms None   Comments Completed the mid program 6MW test and did very well. He improived over 300 feet from when he did his discharge walk in the program previously. These  results were reviewed with him and he was very encouraged to hear this.    Duration Progress to 50 minutes of aerobic without signs/symptoms of physical distress   Intensity Rest + 30   Progression Continue progressive overload as per policy without signs/symptoms or physical distress.   Resistance Training   Training Prescription Yes   Weight 5   Reps 10-12   Interval Training   Interval Training No   Treadmill   MPH 1.9   Grade 0   Minutes 15   NuStep   Level 7  T5   Watts 70   Minutes 15   Recumbant Elliptical   Level 7   RPM 60   Watts 15       Nutrition:  Target Goals: Understanding of nutrition guidelines, daily intake of sodium <1557m, cholesterol <2049m calories 30% from fat and 7% or less from saturated fats, daily to have 5 or more servings of fruits and vegetables.  Biometrics:    Nutrition Therapy Plan and Nutrition Goals:     Nutrition Therapy & Goals - 12/21/14 1200    Nutrition Therapy   Diet  Instructed patient on a 2000 calorie vegan meal plan, DASH diet principles.  He states that he is following Dr. Maurine Minister "Reverse Heart Disease" diet which is vegan and eliminates all oils but admits he is having difficulty following it. Discussed the benefits of a vegan diet but stresssed that have to be careful to include adequate protein.   Fiber 20 grams   Whole Grain Foods 4 servings   Protein 8 ounces/day   Saturated Fats 13 max. grams   Fruits and Vegetables 5 servings/day   Personal Nutrition Goals   Personal Goal #1 To space meals 4-6 hours apart.   Personal Goal #2 Include a protein source at every meal. Refer to list given on meat alternatives. Goal of 75 gms protein/day.   Personal Goal #3 Use my Fitness Pal to record food/beverage intake   Personal Goal #4 In addtions to strategies discussed to help with weight loss, refer to "National Weight Loss Registry" for numerous other healthy strategies that participants have used to help lose and maintain  weight loss.        Nutrition Discharge: Rate Your Plate Scores:     Rate Your Plate - 36/62/94 7654    Rate Your Plate Scores   Pre Score 55   Pre Score % 79 %  Due to vegan diet, many of the questions were  N/A      Psychosocial: Target Goals: Acknowledge presence or absence of depression, maximize coping skills, provide positive support system. Participant is able to verbalize types and ability to use techniques and skills needed for reducing stress and depression.  Initial Review & Psychosocial Screening:   Quality of Life Scores:     Quality of Life - 02/19/15 1130    Quality of Life Scores   Health/Function Post 9.63 %   Socioeconomic Post 14.75 %   Psych/Spiritual Post 12.64 %   Family Post 18.6 %   GLOBAL Post 12.69 %      PHQ-9:     Recent Review Flowsheet Data    Depression screen [facility] 2/9 02/19/2015 02/12/2015 10/31/2014   Decreased Interest _0 Down, Depressed, Hopeless _1 PHQ - 2 Score _2 Altered sleeping 0 0 0   Tired, decreased energy _3 Change in appetite _4 Feeling bad or failure about yourself  _5 Trouble concentrating _6 Moving slowly or fidgety/restless _7 Suicidal thoughts _8 PHQ-9 Score _9 Difficult doing work/chores Somewhat difficult - Somewhat difficult      Psychosocial Evaluation and Intervention:   Psychosocial Re-Evaluation:  Education: Education Goals: Education classes will be provided on a weekly basis, covering required topics. Participant will state understanding/return demonstration of topics presented.  Learning Barriers/Preferences:   Education Topics: Initial Evaluation Education: - Verbal, written and demonstration of respiratory meds, RPE/PD scales, oximetry and breathing techniques. Instruction on use of nebulizers and MDIs: cleaning and proper use, rinsing mouth with steroid doses and importance of monitoring MDI activations.      Pulmonary Rehab from  05/23/2015 in [place]   Date  10/31/14   Educator  LB   Instruction Review Code  2- meets goals/outcomes      General Nutrition Guidelines/Fats and Fiber: -Group instruction provided by verbal, written material, models and posters to present the general guidelines  for heart healthy nutrition. Gives an explanation and review of dietary fats and fiber.      Pulmonary Rehab from 05/23/2015 in [place]   Date  02/05/15   Educator  C. Joneen Caraway, RD   Instruction Review Code  2- meets goals/outcomes      Controlling Sodium/Reading Food Labels: -Group verbal and written material supporting the discussion of sodium use in heart healthy nutrition. Review and explanation with models, verbal and written materials for utilization of the food label.      Pulmonary Rehab from 05/23/2015 in [place]   Date  02/12/15   Educator  C. Rusell, RD   Instruction Review Code  2- meets goals/outcomes      Exercise Physiology & Risk Factors: - Group verbal and written instruction with models to review the exercise physiology of the cardiovascular system and associated critical values. Details cardiovascular disease risk factors and the goals associated with each risk factor.   Aerobic Exercise & Resistance Training: - Gives group verbal and written discussion on the health impact of inactivity. On the components of aerobic and resistive training programs and the benefits of this training and how to safely progress through these programs.      Pulmonary Rehab from 05/23/2015 in [place]   Date  12/27/14   Educator  SW   Instruction Review Code  2- meets goals/outcomes      Flexibility, Balance, General Exercise Guidelines: - Provides group verbal and written instruction on the benefits of flexibility and balance training programs. Provides general  exercise guidelines with specific guidelines to those with heart or lung disease. Demonstration and skill practice provided.      Pulmonary Rehab from 05/23/2015 in [place]   Date  01/24/15   Educator  SW   Instruction Review Code  2- meets goals/outcomes      Stress Management: - Provides group verbal and written instruction about the health risks of elevated stress, cause of high stress, and healthy ways to reduce stress.      Pulmonary Rehab from 05/23/2015 in [place]   Date  01/17/15   Educator  [facility]   Instruction Review Code  2- meets goals/outcomes      Depression: - Provides group verbal and written instruction on the correlation between heart/lung disease and depressed mood, treatment options, and the stigmas associated with seeking treatment.      Pulmonary Rehab from 05/23/2015 in [place]   Date  03/14/15   Educator  Berle Mull, MSW   Instruction Review Code  2- meets goals/outcomes      Exercise & Equipment Safety: - Individual verbal instruction and demonstration of equipment use and safety with use of the equipment.      Pulmonary Rehab from 05/23/2015 in [place]   Date  01/15/15   Educator  L. Owens Shark, RRT [facility]]   Instruction Review Code  2- meets goals/outcomes      Infection Prevention: - Provides verbal and written material to individual with discussion of infection control including proper hand washing and proper equipment cleaning during exercise session.      Pulmonary Rehab from 05/23/2015 in [place]   Date  11/03/14   Educator  Jacqulyn Ducking   Instruction Review Code  2- meets goals/outcomes  Falls Prevention: - Provides verbal and written material to individual with discussion of falls prevention and safety.   Diabetes: - Individual  verbal and written instruction to review signs/symptoms of diabetes, desired ranges of glucose level fasting, after meals and with exercise. Advice that pre and post exercise glucose checks will be done for 3 sessions at entry of program.      Pulmonary Rehab from 05/23/2015 in [place]   Date  02/23/15 [facility] your numbers]   Educator  C. Latha Staunton   Instruction Review Code  2- meets goals/outcomes      Chronic Lung Diseases: - Group verbal and written instruction to review new updates, new respiratory medications, new advancements in procedures and treatments. Provide informative websites and "800" numbers of self-education.      Pulmonary Rehab from 05/23/2015 in [place]   Date  04/30/15   Educator  LB   Instruction Review Code  2- meets goals/outcomes      Lung Procedures: - Group verbal and written instruction to describe testing methods done to diagnose lung disease. Review the outcome of test results. Describe the treatment choices: Pulmonary Function Tests, ABGs and oximetry.      Pulmonary Rehab from 05/23/2015 in [place]   Date  01/26/15   Educator  [place]   Instruction Review Code  2- meets goals/outcomes      Energy Conservation: - Provide group verbal and written instruction for methods to conserve energy, plan and organize activities. Instruct on pacing techniques, use of adaptive equipment and posture/positioning to relieve shortness of breath.      Pulmonary Rehab from 05/23/2015 in [place]   Date  02/07/15   Educator  SW   Instruction Review Code  2- meets goals/outcomes      Triggers: - Group verbal and written instruction to review types of environmental controls: home humidity, furnaces, filters, dust mite/pet prevention, HEPA vacuums. To discuss weather changes, air quality and the benefits of nasal  washing.      Pulmonary Rehab from 05/23/2015 in [place]   Date  01/01/15   Educator  LB   Instruction Review Code  2- meets goals/outcomes      Exacerbations: - Group verbal and written instruction to provide: warning signs, infection symptoms, calling MD promptly, preventive modes, and value of vaccinations. Review: effective airway clearance, coughing and/or vibration techniques. Create an Sports administrator.   Oxygen: - Individual and group verbal and written instruction on oxygen therapy. Includes supplement oxygen, available portable oxygen systems, continuous and intermittent flow rates, oxygen safety, concentrators, and Medicare reimbursement for oxygen.      Pulmonary Rehab from 05/23/2015 in [place]   Date  10/31/14   Educator  LB   Instruction Review Code  2- meets goals/outcomes      Respiratory Medications: - Group verbal and written instruction to review medications for lung disease. Drug class, frequency, complications, importance of spacers, rinsing mouth after steroid MDI's, and proper cleaning methods for nebulizers.      Pulmonary Rehab from 05/23/2015 in [place]   Date  10/31/14   Educator  LB   Instruction Review Code  2- meets goals/outcomes      AED/CPR: - Group verbal and written instruction with the use of models to demonstrate the basic use of the AED with the basic ABC's  of resuscitation.      Pulmonary Rehab from 05/23/2015 in [place]   Date  02/02/15   Educator  CE   Instruction Review Code  2- meets goals/outcomes      Breathing Retraining: - Provides individuals verbal and written instruction on purpose, frequency, and proper technique of diaphragmatic breathing and pursed-lipped breathing. Applies individual practice skills.      Pulmonary Rehab from 05/23/2015 in [place]   Date  10/31/14   Educator  LB   Instruction Review Code  R- Review/reinforce      Anatomy and Physiology of the Lungs: - Group verbal and written instruction with the use of models to provide basic lung anatomy and physiology related to function, structure and complications of lung disease.   Heart Failure: - Group verbal and written instruction on the basics of heart failure: signs/symptoms, treatments, explanation of ejection fraction, enlarged heart and cardiomyopathy.      Pulmonary Rehab from 05/23/2015 in [place]   Date  01/12/15   Educator  Frederich Cha, RRT   Instruction Review Code  2- meets goals/outcomes      Sleep Apnea: - Individual verbal and written instruction to review Obstructive Sleep Apnea. Review of risk factors, methods for diagnosing and types of masks and machines for OSA.   Anxiety: - Provides group, verbal and written instruction on the correlation between heart/lung disease and anxiety, treatment options, and management of anxiety.      Pulmonary Rehab from 05/23/2015 in [place]   Date  02/14/15   Educator  Berle Mull, MSW   Instruction Review Code  2- Meets goals/outcomes      Relaxation: - Provides group, verbal and written instruction about the benefits of relaxation for patients with heart/lung disease. Also provides patients with examples of relaxation techniques.      Pulmonary Rehab from 05/23/2015 in [place]   Date  05/23/15   Educator  Lucianne Lei LCSW   Instruction Review Code  2- Meets goals/outcomes      Knowledge Questionnaire Score:     Knowledge Questionnaire Score - 02/20/15 1018    Knowledge Questionnaire Score   Post Score -1      Personal Goals and Risk Factors at Admission:     Personal Goals and Risk Factors at Admission - 03/02/15 1130    Personal Goals and Risk  Factors on Admission    Weight Management Yes      Personal Goals and Risk Factors Review:      Goals and Risk Factor Review      12/20/14 1248 12/20/14 1548 01/01/15 1234 01/08/15 1547 01/15/15 1130   Weight Management   Goals Progress/Improvement seen   Yes  No   Comments   ongoing, would like to lose 150 lbs.  Asked  Mr Kuan if he had an appointment with the dietitian yet; he said he did meet with her but continues to struggle with his diet - large portion sizes and snacking late at night; his weight is averaging  340lbs.  He has been on a [employer]  since 2006 and has helped his cardiac health.   Increase Aerobic Exercise and Physical Activity   Goals Progress/Improvement seen    Yes     Comments   continue to make gains on the treadmill     Improve shortness of breath with ADL's  Goals Progress/Improvement seen    Yes Yes    Comments    Mr Barca is managing his shortness of breath in [facility] by managing  his oxygen and adjusting the flow rate to 6l/m with exercise and with some activites at home activities    Breathing Techniques   Goals Progress/Improvement seen   Yes Yes     Comments  Encouraging Mr Sedore to use PLB with his exercise in [place]. With his low O2Sat's on the TM today, he improved his readings with the PLB. He states he really has to make himself breath deep. Encouraged him to use PLB  when walking into [place] and at his apartment in the long hallway to elevator. wants to improve, also incorporate meditation     Increase knowledge of respiratory medications   Goals Progress/Improvement seen     Yes    Comments    Mr Thain has a good understanding of his Spiriva; he states that he is not sure "it works", but explained the importance of Spiriva, especially with activity.    Stress   Goal   To meet with psychosocial counselor for stress and relaxation information and guidance. To state understanding of performing relaxation techniques and or identifying personal stressors.      Other Goal   Goals Progress/Improvement seen  Yes       Comments Discussed with Mechel about a piece of exercise equipment he has avaiable for home use and the benefits of it.         01/19/15 1130 01/24/15 1130 01/24/15 1330 01/29/15 1550 03/19/15 1130   Weight Management   Goals Progress/Improvement seen     No   Comments     Mr Furtick has maintained his weight at [place]. He continues with his [place] for his heart disease.   Increase Aerobic Exercise and Physical Activity   Goals Progress/Improvement seen  Yes       Comments Mr Mullaly states his endurance is increasing. 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 He still needs to find a oxygen portable concentrator that will work for this that would cover the time involved.       Understand more about Heart/Pulmonary Disease   Goals Progress/Improvement seen    Yes  Yes   Comments   Has enjoyed all the education classes, has trouble retaining information or recalling information, but he still enjoys the sessions and feels like they are helping him better manage his disease.   Mr Furtick brought in his PFT and CT results from recent testing. We discussed his Fev1 and how it related to COPD. I plan to continue reviewing his other results. Mr Penrose is very open for  new knowledge about lung disease.   Improve shortness of breath with ADL's   Goals Progress/Improvement seen   Yes Yes     Comments  Mr Mannan brought his harmonica in to class and played it for the class. We talked about the benefits of playing the harmonica on inspiratory and expiratory muscle strength and PLB technique. Has not seen much improvement in SOB since last month, but is still happy with his progress in the program.      Breathing Techniques   Goals Progress/Improvement seen    Yes     Comments   Uses PLB when his O2 saturation is low or when he feels short of breath. He especially utilizes this during class while  exercising, but also uses it around the house and when he is out.      Increase knowledge of respiratory medications   Goals Progress/Improvement seen     Yes    Comments    Discussed with Mr Sproule about use an oral device with his sleep apnea, since he can not tolerate the CPAP masks, he was open to it and gave him a list of dentists in the area who are certiified in the oral devices.      03/21/15 1130 03/26/15 1130 04/02/15 0853 04/04/15 1130 04/11/15 1603   Increase Aerobic Exercise and Physical Activity   Goals Progress/Improvement seen    No  Yes   Comments   Has not seen much improvement, however, has been remaining stable which is key for him at the juncture.  Mr Lefeber walked on the TM at 2.0 for the entire 42mns and maintained acceptable O2Sats.   Understand more about Heart/Pulmonary Disease   Goals Progress/Improvement seen  Yes       Comments I continued to educate Mr OWieckon his recent PFT . I explained the FEV1, RV, and DLCO. He is being referred to a pulmonologist which he is very positive about for answers about his lung condition.       Improve shortness of breath with ADL's   Goals Progress/Improvement seen   Yes      Comments  Mr OPotvinhas good days and bad days with his breathing. He is adjusting his oxygen with exercise and pacing himself with activites. He is looking forward to consulting with a pulmonologist for more detail about his shortness of breath and breathing.      Breathing Techniques   Goals Progress/Improvement seen   Yes      Comments  Mr OWesbyuses PLB, especially on the TM and has maintined acceptable O2Sat with this exercise.      Increase knowledge of respiratory medications   Goals Progress/Improvement seen   Yes  Yes    Comments  Mr OPurdyis taking his Spiriva daily. So far, the "E" cylinders are his portal system for oxygen.  Mr OSplawnhas been approved for a portable concentrator, and [facility] has order the oxygen system.    Diabetes   Goal  --  No  problems with his diabete in [facility]      Hypertension   Goal  --  Maintaining acceptable BP at rest wnd with exercise in [facility].      Abnormal Lipids   Goal  --  No problems with cholesterol reading in [facility].      Stress   Goal  --  Mr OBoakyehas met [place] the counselor [place] I have educated him on his  pulmonary test results and on portable concentrators.        04/18/15 1200 04/18/15 1244 04/18/15 1544 04/24/15 0727 05/08/15 0844   Weight Management   Goals Progress/Improvement seen Yes --  Prediabetes on metformin. Instructed what HBA!c blood work means since RF[place]said no one told him to check his blood sugars at home since they do blood work.       Comments maintaining current weight well.       Increase Aerobic Exercise and Physical Activity   Goals Progress/Improvement seen  Yes       Comments maintaing current levels with acceptable responses       Understand more about Heart/Pulmonary Disease   Goals Progress/Improvement seen    Yes  Yes  Comments   Mr Simson has an appointment with a pulmonologist on 05/24/2015. He is disappointed that the appointment is not sooner, but hopeful he will learn more about his lung disease.  Mr Manuele brought in his harmonica and instructed me on the breathing techniques for different sounds. He watched a video on the pulmonary harmonica and recommended the regular harmonica for  Chronic Lung patients.   Improve shortness of breath with ADL's   Goals Progress/Improvement seen  Yes       Comments maintaining SOB symptoms.       Breathing Techniques   Goals Progress/Improvement seen  Yes       Comments continuing to practice PLB with exertion       Increase knowledge of respiratory medications   Goals Progress/Improvement seen      Yes   Comments     Mr Hendriks acquired a better O2 Concentrator with the increased intermittent flow to 5l/m which works better for him.  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    Diabetes   Goal   Blood glucose control identified by blood glucose values, HgbA1C. Participant verbalizes understanding of the signs/symptoms of hyper/hypo glycemia, proper foot care and importance of medication and nutrition plan for blood glucose control.     Hypertension   Goal Participant will see blood pressure controlled within the values of 140/70m/Hg or within value directed by their physician.  still receiving acceptable responses --  Blood pressure is stable today and usually.       Abnormal Lipids   Goal    Cholesterol controlled with medications as prescribed, with individualized exercise RX and with personalized nutrition plan. Value goals: LDL < 78m HDL > 4072mParticipant states understanding of desired cholesterol values and following prescriptions.    Stress   Goal    --  Mr OlsDolinsky much more relaxed when he comes to class. He has accomplished many positive changes - more understanding of his lung disease, plus has an appointment with a pulmonologist, he has a better portable oxygen system order .         05/09/15 1130 05/21/15 1302 05/21/15 1538 06/01/15 1516 06/04/15 1236   Weight Management   Goals Progress/Improvement seen  Yes      Comments  Arash "I walk by a brownie and gain weight". I told RicMakailwas impressed after Thanksgiving his weight was down a couple of lbs".       Increase Aerobic Exercise and Physical Activity   Goals Progress/Improvement seen  Yes Yes      Comments Mr OlsBonebrakeproved [id number] his mid [id number] is now walking on the TM 2.0 with improved O2Sat's on the 8l/m oxygen. He has progressed to L8 on the REL and L7 on the T5 - great progression. " I am able to do a little more and breath easier when I am walking".       Understand more about Heart/Pulmonary Disease   Goals Progress/Improvement seen   Yes   Yes   Comments     RichaCandaceoing for a second opinion with a Pulmonary MD since he wants to  find out more details about an infection the other MD mentioned. Earnest is not running a fever but is "coughing up stuff all the time between "butterscotch color and dark chocolate". Grayden doesn't think he has had a bronchoscopy lately.  Improve shortness of breath with ADL's   Goals Progress/Improvement seen    Yes     Comments   Mr Witucki's SOB Questionnaire improved by 7 points - Minimal difference is 5 points. Mr Engelmann is more aware of his shortness of breath and how it correlates to O2Sat's. He adjusts his oxygen flow to 8l/m with his exercise goals.     Breathing Techniques   Goals Progress/Improvement seen    Yes Yes    Comments   Mr Putzier has good technique with his PLB and is very aware when to use this techniqe with his shortness of breath. Mr. Irish Elders is using good technique with PLB.  He is having to use it more often due to increases in workload, but his O2 sats are WNL.    Diabetes   Goal   --  Mr Szczepanik has not had any problems with his glucose levels in [facility].     Hypertension   Goal  --  Blood pressure is 140/60 and is stable today.          Personal Goals Discharge (Final Personal Goals and Risk Factors Review):      Goals and Risk Factor Review - 06/04/15 1236    Understand more about Heart/Pulmonary Disease   Goals Progress/Improvement seen  Yes   Comments Ura is going for a second opinion with a Pulmonary MD since he wants to find out more details about an infection the other MD mentioned. Andra is not running a fever but is "coughing up stuff all the time between "butterscotch color and dark chocolate". Jatinder doesn't think he has had a bronchoscopy lately.       ITP Comments:   Comments: Will go to MD tomorrow.

## 2015-06-04 NOTE — Progress Notes (Signed)
Daily Session Note  Patient Details  Name: Miguel Palmer MRN: [id number] Date of Birth: 1941-05-12 Referring Provider:  [name], MD  Encounter Date: 06/04/2015  Check In:     Session Check In - 06/04/15 1205    Check-In   Staff Present Carson Myrtle, BS, RRT, Respiratory Therapist;Ersilia Brawley, RN, BSN;Steven Way, BS, ACSM EP-C, Exercise Physiologist   ER physicians immediately available to respond to emergencies [facility] immediately available ER MD   Physician(s) Dr. Clemencia Course and Dr. Clearnce Hasten   Medication changes reported     No   Fall or balance concerns reported    No   Warm-up and Cool-down Performed on first and last piece of equipment   VAD Patient? No   Pain Assessment   Currently in Pain? No/denies         Goals Met:  Proper associated with RPD/PD & O2 Sat Exercise tolerated well  Goals Unmet:  Not Applicable  Goals Comments:    Dr. Emily Filbert is Medical Director for [facility] and [facility].

## 2015-06-11 ENCOUNTER — Encounter: Payer: Medicare Other | Admitting: *Deleted

## 2015-06-11 DIAGNOSIS — J449 Chronic obstructive pulmonary disease, unspecified: Secondary | ICD-10-CM

## 2015-06-11 DIAGNOSIS — G4730 Sleep apnea, unspecified: Secondary | ICD-10-CM

## 2015-06-11 DIAGNOSIS — J42 Unspecified chronic bronchitis: Secondary | ICD-10-CM | POA: Diagnosis not present

## 2015-06-11 NOTE — Progress Notes (Addendum)
Daily Session Note  Patient Details  Name: Miguel Palmer MRN: [id number] Date of Birth: 08-Nov-1940 Referring Provider:  [name], MD  Encounter Date: 06/11/2015  Check In:     Session Check In - 06/11/15 1240    Check-In   Staff Present Carson Myrtle, BS, RRT, Respiratory Therapist;Alexah Kivett, RN, BSN;Steven Way, BS, ACSM EP-C, Exercise Physiologist   ER physicians immediately available to respond to emergencies [facility] immediately available ER MD   Medication changes reported     No   Fall or balance concerns reported    No   Warm-up and Cool-down Performed on first and last piece of equipment   VAD Patient? No   Pain Assessment   Currently in Pain? No/denies         Goals Met:  Proper associated with RPD/PD & O2 Sat Exercise tolerated well  Goals Unmet:  Not Applicable  Goals Comments: Patient completed exercise prescription and all exercise goals during rehab sessions.The exercise was tolerated well, and the patient is progressing in the program.    Dr. Emily Filbert is Medical Director for [facility] and [facility].

## 2015-06-13 ENCOUNTER — Encounter: Payer: Medicare Other | Admitting: *Deleted

## 2015-06-13 DIAGNOSIS — J449 Chronic obstructive pulmonary disease, unspecified: Secondary | ICD-10-CM

## 2015-06-13 DIAGNOSIS — J42 Unspecified chronic bronchitis: Secondary | ICD-10-CM | POA: Diagnosis not present

## 2015-06-13 NOTE — Progress Notes (Signed)
Daily Session Note  Patient Details  Name: Miguel Palmer MRN: [id number] Date of Birth: Jul 21, 1940 Referring Provider:  [name], MD  Encounter Date: 06/13/2015  Check In:     Session Check In - 06/13/15 1217    Check-In   Staff Present Carson Myrtle, BS, RRT, Respiratory Therapist;Trentin Knappenberger Dillard Essex, MS, ACSM CEP, Exercise Physiologist;Steven Way, BS, ACSM EP-C, Exercise Physiologist   ER physicians immediately available to respond to emergencies [facility] immediately available ER MD   Physician(s) Edd Fabian and Jimmye Norman   Medication changes reported     No   Fall or balance concerns reported    No   Warm-up and Cool-down Performed on first and last piece of equipment   VAD Patient? No   Pain Assessment   Currently in Pain? No/denies   Multiple Pain Sites No         Goals Met:  Proper associated with RPD/PD & O2 Sat Independence with exercise equipment Using PLB without cueing & demonstrates good technique Exercise tolerated well Strength training completed today  Goals Unmet:  Not Applicable  Goals Comments: Patient completed exercise prescription and all exercise goals during rehab session. The exercise was tolerated well and the patient is progressing in the program.    Dr. Emily Filbert is Medical Director for [facility] and [facility].

## 2015-06-19 ENCOUNTER — Encounter: Payer: Self-pay | Admitting: Respiratory Therapy

## 2015-06-19 DIAGNOSIS — G4730 Sleep apnea, unspecified: Secondary | ICD-10-CM

## 2015-06-19 DIAGNOSIS — J449 Chronic obstructive pulmonary disease, unspecified: Secondary | ICD-10-CM

## 2015-06-19 NOTE — Progress Notes (Signed)
Pulmonary Individual Treatment Plan  Patient Details  Name: Miguel Palmer MRN: [id number] Date of Birth: March 07, 1941 Referring Provider:  Dr [name]  Initial Encounter Date: 03/02/2015  Visit Diagnosis: COPD, mild ([place])  Sleep apnea  Patient's Home Medications on Admission:  Current outpatient prescriptions:    aspirin 81 MG tablet, Take 81 mg by mouth every other day., Disp: , Rfl:    co-enzyme Q-10 30 MG capsule, Take 30 mg by mouth 2 (two) times daily., Disp: , Rfl:    dextroamphetamine (DEXEDRINE SPANSULE) 15 MG 24 hr capsule, Take 15 mg by mouth daily., Disp: , Rfl:    gabapentin (NEURONTIN) 400 MG capsule, Take by mouth., Disp: , Rfl:    Ginkgo Biloba 60 MG TABS, Take by mouth., Disp: , Rfl:    Glucosamine Sulfate 1000 MG CAPS, Take by mouth., Disp: , Rfl:    isosorbide mononitrate (IMDUR) 30 MG 24 hr tablet, TAKE 1 TABLET BY MOUTH EVERY DAY, Disp: , Rfl:    metFORMIN (GLUCOPHAGE) 500 MG tablet, TAKE 1 TABLET BY MOUTH DAILY., Disp: , Rfl:    metoprolol succinate (TOPROL-XL) 25 MG 24 hr tablet, TAKE 1 TABLET BY MOUTH EVERY DAY, Disp: , Rfl:    modafinil (PROVIGIL) 200 MG tablet, Take by mouth., Disp: , Rfl:    Multiple Vitamins-Minerals (MULTIVITAMIN & MINERAL PO), Take by mouth., Disp: , Rfl:    niacin (NIASPAN) 1000 MG CR tablet, Take by mouth., Disp: , Rfl:    nitroGLYCERIN (NITROSTAT) 0.4 MG SL tablet, Place under the tongue., Disp: , Rfl:    prasugrel (EFFIENT) 10 MG TABS tablet, TAKE 1 TABLET BY MOUTH EVERY DAY, Disp: , Rfl:    rosuvastatin (CRESTOR) 20 MG tablet, Take by mouth., Disp: , Rfl:    tiotropium (SPIRIVA) 18 MCG inhalation capsule, Place into inhaler and inhale., Disp: , Rfl:   Past Medical History: No past medical history on file.  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       ADL UCSD:     ADL UCSD      01/03/15 1130 02/16/15 1130 02/16/15 1631   ADL UCSD   ADL Phase Mid Entry Exit   SOB Score total 69 85 85   Rest _0 Walk _1 Stairs _2 Bath _3 Dress _4 Shop _5 05/09/15 1130       ADL UCSD   ADL Phase Mid     SOB Score total 68     Rest 0     Walk 3     Stairs 4     Bath 4     Dress 3     Shop 4         Pulmonary Function Assessment:   Exercise Target Goals:    Exercise Program Goal: Individual exercise prescription set with THRR, safety & activity barriers. Participant demonstrates ability to understand and report RPE using BORG scale, to self-measure pulse accurately, and to acknowledge the importance of the exercise prescription.  Exercise Prescription Goal: Starting with aerobic activity 30 plus minutes a day, 3 days per week for initial exercise prescription.  Provide home exercise prescription and guidelines that participant acknowledges understanding prior to discharge.  Activity Barriers & Risk Stratification:   6 Minute Walk:     6 Minute Walk      01/03/15 1225 02/09/15 1159 02/09/15 1214   6 Minute Walk   Phase Mid Program Discharge    Distance 880 feet 600 feet    Walk Time 6 minutes 5 minutes    Resting HR 105 bpm 102 bpm (p) --  Pulse ox on 6 liters before 6 minute walk 98%.   Resting BP 124/70 mmHg 158/88 mmHg    Max Ex. HR 136 bpm 109 bpm    Max Ex. BP 182/82 mmHg 172/94 mmHg    RPE 17 14    Perceived Dyspnea  6 4 (p) --  Post 6 min pulse ox 88% on 6 liters oxgyen   Symptoms No       05/09/15 1157       6 Minute Walk   Phase Mid Program     Distance 915 feet     Walk Time 6 minutes     Resting HR 88 bpm     Resting BP 148/80 mmHg     Max Ex. HR 143 bpm     Max Ex. BP 164/86 mmHg     RPE 17     Perceived Dyspnea  5     Symptoms No        Initial Exercise Prescription:   Exercise Prescription Changes:      Exercise Prescription Changes      01/01/15 1200 01/15/15 1200 01/17/15 1500 01/22/15 1300 01/31/15 1200   Exercise Review   Progression _0    Response to Exercise   Blood Pressure (Admit) 118/78 mmHg 118/78 mmHg  118/78 mmHg 140/88 mmHg   Blood Pressure (Exercise) 140/68 mmHg 140/68 mmHg  140/68 mmHg 156/80 mmHg   Blood Pressure (Exit) 122/62 mmHg 122/62 mmHg  122/62 mmHg 118/80 mmHg   Heart Rate (Admit) 80 bpm 80 bpm  80 bpm 88 bpm   Heart Rate (Exercise) 108 bpm 108 bpm  108 bpm 122 bpm   Heart Rate (Exit) 85 bpm 85 bpm  85 bpm 98 bpm   Oxygen Saturation (Admit) 91 %  6l/m continuous 91 %  6l/m continuous  91 %  6l/m continuous 93 %   Oxygen Saturation (Exercise) 86 % 86 %  86 % 90 %   Oxygen Saturation (Exit) 97 % 97 %  97 % 94 %   Rating of Perceived Exertion (Exercise) _1 Perceived Dyspnea (Exercise) _2 Intensity   THRR unchanged     Progression   Continue progressive overload as per policy without signs/symptoms or physical distress.     Resistance Training   Training Prescription _3    Weight _4 Reps 10-12 10-12 10-12 10-12 10-12   Treadmill   MPH 1.5 1.5 1.5 1.5 1.8   Grade 0 0 0 0 0   Minutes _5 NuStep   Level _6 T5   Watts 50 50  50 80   Minutes _7 Recumbant Elliptical   Level _8 RPM 40 50  50 50   Minutes 12    15     01/31/15 1400  02/12/15 1200 02/23/15 1500 03/27/15 0900 04/23/15 1100   Exercise Review   Progression Yes Yes  Yes Yes   Response to Exercise   Blood Pressure (Admit)   134/83 mmHg 146/80 mmHg  Data 03/26/15 140/70 mmHg  Data 03/26/15   Blood Pressure (Exercise)   140/80 mmHg 150/70 mmHg 158/74 mmHg   Blood Pressure (Exit)   138/70 mmHg 148/80 mmHg 142/80 mmHg   Heart Rate (Admit)   101 bpm 84 bpm 84 bpm   Heart Rate (Exercise)   118 bpm 127 bpm 124 bpm   Heart Rate (Exit)   97 bpm 103 bpm 93 bpm   Oxygen Saturation (Admit)   91 % 97 % 92  %   Oxygen Saturation (Exercise)   89 % 88 %  8l/m C 93 %  8l/m C   Oxygen Saturation (Exit)   99 % 96 % 92 %   Rating of Perceived Exertion (Exercise)   _0 Perceived Dyspnea (Exercise)   _1 Symptoms     none   Frequency    Add 1 additional day to program exercise sessions. Add 1 additional day to program exercise sessions.   Duration    Progress to 50 minutes of aerobic without signs/symptoms of physical distress Progress to 50 minutes of aerobic without signs/symptoms of physical distress   Intensity    Rest + 30 Rest + 30   Progression    Continue progressive overload as per policy without signs/symptoms or physical distress. Continue progressive overload as per policy without signs/symptoms or physical distress.   Resistance Training   Training Prescription   Yes Yes Yes   Weight   _2 Reps   10-12 10-12 10-12   Treadmill   MPH   1.8 1.9 1.9   Grade   0 0 0   Minutes   _3 NuStep   Level  7  T5 Nustep 7  T5 Nustep 7  T5 7  T5   Watts  60 60 70 70   Minutes   _4 Recumbant Elliptical   Level   _5 RPM   50 60 60   Watts    15 15   Minutes   15     Home Exercise Plan   Plans to continue exercise at   --  Mr. Evilsizer plans to attend another 36 sessions per MD order.       05/09/15 1200 05/21/15 1621 06/11/15 1600       Exercise Review   Progression Yes Yes Yes     Response to Exercise   Blood Pressure (Admit)  142/76 mmHg 124/60 mmHg     Blood Pressure (Exercise)  154/70 mmHg 168/88 mmHg     Blood Pressure (Exit)  148/80 mmHg      Heart Rate (Admit)  106 bpm      Heart Rate (Exercise)  115 bpm 121 bpm     Heart Rate (Exit)  93 bpm 86 bpm     Oxygen Saturation (Admit)  95 % 91 %     Oxygen Saturation (Exercise)  95 % 89 %     Oxygen Saturation (Exit)  96 % 88 %  Room Air     Rating of Perceived Exertion (Exercise)  13      Perceived Dyspnea (Exercise)  4      Symptoms None None  None     Comments Completed the mid program 6MW test  and did very well. He improived over 300 feet from when he did his discharge walk in the program previously. These results were reviewed with him and he was very encouraged to hear this.  Completed the mid program 6MW test and did very well. He improived over 300 feet from when he did his discharge walk in the program previously. These results were reviewed with him and he was very encouraged to hear this.       Frequency   Add 2 additional days to program exercise sessions.     Duration Progress to 50 minutes of aerobic without signs/symptoms of physical distress Progress to 50 minutes of aerobic without signs/symptoms of physical distress Progress to 50 minutes of aerobic without signs/symptoms of physical distress     Intensity Rest + 30 Rest + 30 Rest + 30     Progression Continue progressive overload as per policy without signs/symptoms or physical distress. Continue progressive overload as per policy without signs/symptoms or physical distress. Continue progressive overload as per policy without signs/symptoms or physical distress.     Resistance Training   Training Prescription Yes Yes Yes     Weight _0 Reps 10-12 10-12 10-12     Interval Training   Interval Training No No No     Treadmill   MPH 1.9 1.9 2     Grade 0 0 0     Minutes _1 NuStep   Level 7  T5 7  T5 7  T5     Watts 70 70 70     Minutes _2 Recumbant Elliptical   Level _3 RPM 60 60 65     Watts _4 Discharge Exercise Prescription (Final Exercise Prescription Changes):     Exercise Prescription Changes - 06/11/15 1600    Exercise Review   Progression Yes   Response to Exercise   Blood Pressure (Admit) 124/60 mmHg   Blood Pressure (Exercise) 168/88 mmHg   Heart Rate (Exercise) 121 bpm   Heart Rate (Exit) 86 bpm   Oxygen Saturation (Admit) 91 %   Oxygen Saturation (Exercise) 89 %   Oxygen Saturation (Exit) 88 %  Room Air   Symptoms None   Frequency Add 2  additional days to program exercise sessions.   Duration Progress to 50 minutes of aerobic without signs/symptoms of physical distress   Intensity Rest + 30   Progression Continue progressive overload as per policy without signs/symptoms or physical distress.   Resistance Training   Training Prescription Yes   Weight 5   Reps 10-12   Interval Training   Interval Training No   Treadmill   MPH 2   Grade 0   Minutes 15   NuStep   Level 7  T5   Watts 70   Minutes 15   Recumbant Elliptical   Level 8   RPM 65   Watts 15       Nutrition:  Target Goals: Understanding of nutrition guidelines, daily intake of sodium <1579m, cholesterol <2079m calories 30% from fat and 7% or less from saturated fats, daily to have 5 or more servings of fruits and vegetables.  Biometrics:    Nutrition Therapy Plan and Nutrition Goals:     Nutrition Therapy &  Goals - 12/21/14 1200    Nutrition Therapy   Diet Instructed patient on a 2000 calorie vegan meal plan, DASH diet principles.  He states that he is following Dr. Maurine Minister "Reverse Heart Disease" diet which is vegan and eliminates all oils but admits he is having difficulty following it. Discussed the benefits of a vegan diet but stresssed that have to be careful to include adequate protein.   Fiber 20 grams   Whole Grain Foods 4 servings   Protein 8 ounces/day   Saturated Fats 13 max. grams   Fruits and Vegetables 5 servings/day   Personal Nutrition Goals   Personal Goal #1 To space meals 4-6 hours apart.   Personal Goal #2 Include a protein source at every meal. Refer to list given on meat alternatives. Goal of 75 gms protein/day.   Personal Goal #3 Use my Fitness Pal to record food/beverage intake   Personal Goal #4 In addtions to strategies discussed to help with weight loss, refer to "National Weight Loss Registry" for numerous other healthy strategies that participants have used to help lose and maintain weight loss.         Nutrition Discharge: Rate Your Plate Scores:     Rate Your Plate - 44/73/95 8441    Rate Your Plate Scores   Pre Score 55   Pre Score % 79 %  Due to vegan diet, many of the questions were  N/A      Psychosocial: Target Goals: Acknowledge presence or absence of depression, maximize coping skills, provide positive support system. Participant is able to verbalize types and ability to use techniques and skills needed for reducing stress and depression.  Initial Review & Psychosocial Screening:   Quality of Life Scores:     Quality of Life - 02/19/15 1130    Quality of Life Scores   Health/Function Post 9.63 %   Socioeconomic Post 14.75 %   Psych/Spiritual Post 12.64 %   Family Post 18.6 %   GLOBAL Post 12.69 %      PHQ-9:     Recent Review Flowsheet Data    Depression screen [facility] 2/9 02/19/2015 02/12/2015 10/31/2014   Decreased Interest _0 Down, Depressed, Hopeless _1 PHQ - 2 Score _2 Altered sleeping 0 0 0   Tired, decreased energy _3 Change in appetite _4 Feeling bad or failure about yourself  _5 Trouble concentrating _6 Moving slowly or fidgety/restless _7 Suicidal thoughts _8 PHQ-9 Score _9 Difficult doing work/chores Somewhat difficult - Somewhat difficult      Psychosocial Evaluation and Intervention:   Psychosocial Re-Evaluation:  Education: Education Goals: Education classes will be provided on a weekly basis, covering required topics. Participant will state understanding/return demonstration of topics presented.  Learning Barriers/Preferences:   Education Topics: Initial Evaluation Education: - Verbal, written and demonstration of respiratory meds, RPE/PD scales, oximetry and breathing techniques. Instruction on use of nebulizers and MDIs: cleaning and proper use, rinsing mouth with steroid doses and importance of monitoring MDI activations.      Pulmonary Rehab from 05/23/2015 in [place]   Date  10/31/14   Educator  LB   Instruction Review Code  2- meets goals/outcomes      General Nutrition Guidelines/Fats and Fiber: -Group instruction provided  by verbal, written material, models and posters to present the general guidelines for heart healthy nutrition. Gives an explanation and review of dietary fats and fiber.      Pulmonary Rehab from 05/23/2015 in [place]   Date  02/05/15   Educator  C. Joneen Caraway, RD   Instruction Review Code  2- meets goals/outcomes      Controlling Sodium/Reading Food Labels: -Group verbal and written material supporting the discussion of sodium use in heart healthy nutrition. Review and explanation with models, verbal and written materials for utilization of the food label.      Pulmonary Rehab from 05/23/2015 in [place]   Date  02/12/15   Educator  C. Rusell, RD   Instruction Review Code  2- meets goals/outcomes      Exercise Physiology & Risk Factors: - Group verbal and written instruction with models to review the exercise physiology of the cardiovascular system and associated critical values. Details cardiovascular disease risk factors and the goals associated with each risk factor.   Aerobic Exercise & Resistance Training: - Gives group verbal and written discussion on the health impact of inactivity. On the components of aerobic and resistive training programs and the benefits of this training and how to safely progress through these programs.      Pulmonary Rehab from 05/23/2015 in [place]   Date  12/27/14   Educator  SW   Instruction Review Code  2- meets goals/outcomes      Flexibility, Balance, General Exercise Guidelines: - Provides group verbal and written instruction on the benefits of flexibility and balance training programs. Provides general exercise guidelines  with specific guidelines to those with heart or lung disease. Demonstration and skill practice provided.      Pulmonary Rehab from 05/23/2015 in [place]   Date  01/24/15   Educator  SW   Instruction Review Code  2- meets goals/outcomes      Stress Management: - Provides group verbal and written instruction about the health risks of elevated stress, cause of high stress, and healthy ways to reduce stress.      Pulmonary Rehab from 05/23/2015 in [place]   Date  01/17/15   Educator  [facility]   Instruction Review Code  2- meets goals/outcomes      Depression: - Provides group verbal and written instruction on the correlation between heart/lung disease and depressed mood, treatment options, and the stigmas associated with seeking treatment.      Pulmonary Rehab from 05/23/2015 in [place]   Date  03/14/15   Educator  Berle Mull, MSW   Instruction Review Code  2- meets goals/outcomes      Exercise & Equipment Safety: - Individual verbal instruction and demonstration of equipment use and safety with use of the equipment.      Pulmonary Rehab from 05/23/2015 in [place]   Date  01/15/15   Educator  L. Owens Shark, RRT [facility] [place]]   Instruction Review Code  2- meets goals/outcomes      Infection Prevention: - Provides verbal and written material to individual with discussion of infection control including proper hand washing and proper equipment cleaning during exercise session.      Pulmonary Rehab from 05/23/2015 in [place]   Date  11/03/14   Educator  Jacqulyn Ducking  Instruction Review Code  2- meets goals/outcomes      Falls Prevention: - Provides verbal and written material to individual with discussion of falls prevention and safety.   Diabetes: - Individual verbal and written  instruction to review signs/symptoms of diabetes, desired ranges of glucose level fasting, after meals and with exercise. Advice that pre and post exercise glucose checks will be done for 3 sessions at entry of program.      Pulmonary Rehab from 05/23/2015 in [place]   Date  02/23/15 [facility] your numbers]   Educator  C. Enterkin   Instruction Review Code  2- meets goals/outcomes      Chronic Lung Diseases: - Group verbal and written instruction to review new updates, new respiratory medications, new advancements in procedures and treatments. Provide informative websites and "800" numbers of self-education.      Pulmonary Rehab from 05/23/2015 in [place]   Date  04/30/15   Educator  LB   Instruction Review Code  2- meets goals/outcomes      Lung Procedures: - Group verbal and written instruction to describe testing methods done to diagnose lung disease. Review the outcome of test results. Describe the treatment choices: Pulmonary Function Tests, ABGs and oximetry.      Pulmonary Rehab from 05/23/2015 in [place]   Date  01/26/15   Educator  [place]   Instruction Review Code  2- meets goals/outcomes      Energy Conservation: - Provide group verbal and written instruction for methods to conserve energy, plan and organize activities. Instruct on pacing techniques, use of adaptive equipment and posture/positioning to relieve shortness of breath.      Pulmonary Rehab from 05/23/2015 in [place]   Date  02/07/15   Educator  SW   Instruction Review Code  2- meets goals/outcomes      Triggers: - Group verbal and written instruction to review types of environmental controls: home humidity, furnaces, filters, dust mite/pet prevention, HEPA vacuums. To discuss weather changes, air quality and the benefits of nasal washing.      Pulmonary  Rehab from 05/23/2015 in [place]   Date  01/01/15   Educator  LB   Instruction Review Code  2- meets goals/outcomes      Exacerbations: - Group verbal and written instruction to provide: warning signs, infection symptoms, calling MD promptly, preventive modes, and value of vaccinations. Review: effective airway clearance, coughing and/or vibration techniques. Create an Sports administrator.   Oxygen: - Individual and group verbal and written instruction on oxygen therapy. Includes supplement oxygen, available portable oxygen systems, continuous and intermittent flow rates, oxygen safety, concentrators, and Medicare reimbursement for oxygen.      Pulmonary Rehab from 05/23/2015 in [place]   Date  10/31/14   Educator  LB   Instruction Review Code  2- meets goals/outcomes      Respiratory Medications: - Group verbal and written instruction to review medications for lung disease. Drug class, frequency, complications, importance of spacers, rinsing mouth after steroid MDI's, and proper cleaning methods for nebulizers.      Pulmonary Rehab from 05/23/2015 in [place]   Date  10/31/14   Educator  LB   Instruction Review Code  2- meets goals/outcomes      AED/CPR: - Group verbal and written instruction with the use of models  to demonstrate the basic use of the AED with the basic ABC's of resuscitation.      Pulmonary Rehab from 05/23/2015 in [place]   Date  02/02/15   Educator  CE   Instruction Review Code  2- meets goals/outcomes      Breathing Retraining: - Provides individuals verbal and written instruction on purpose, frequency, and proper technique of diaphragmatic breathing and pursed-lipped breathing. Applies individual practice skills.      Pulmonary Rehab from 05/23/2015 in [place]    Date  10/31/14   Educator  LB   Instruction Review Code  R- Review/reinforce      Anatomy and Physiology of the Lungs: - Group verbal and written instruction with the use of models to provide basic lung anatomy and physiology related to function, structure and complications of lung disease.   Heart Failure: - Group verbal and written instruction on the basics of heart failure: signs/symptoms, treatments, explanation of ejection fraction, enlarged heart and cardiomyopathy.      Pulmonary Rehab from 05/23/2015 in [place]   Date  01/12/15   Educator  Frederich Cha, RRT   Instruction Review Code  2- meets goals/outcomes      Sleep Apnea: - Individual verbal and written instruction to review Obstructive Sleep Apnea. Review of risk factors, methods for diagnosing and types of masks and machines for OSA.   Anxiety: - Provides group, verbal and written instruction on the correlation between heart/lung disease and anxiety, treatment options, and management of anxiety.      Pulmonary Rehab from 05/23/2015 in [place]   Date  02/14/15   Educator  Berle Mull, MSW   Instruction Review Code  2- Meets goals/outcomes      Relaxation: - Provides group, verbal and written instruction about the benefits of relaxation for patients with heart/lung disease. Also provides patients with examples of relaxation techniques.      Pulmonary Rehab from 05/23/2015 in [place]   Date  05/23/15   Educator  Lucianne Lei LCSW   Instruction Review Code  2- Meets goals/outcomes      Knowledge Questionnaire Score:     Knowledge Questionnaire Score - 02/20/15 1018    Knowledge Questionnaire Score   Post Score -1      Personal Goals and Risk Factors at Admission:     Personal Goals and Risk Factors at Admission - 03/02/15 1130    Personal Goals and Risk Factors on Admission     Weight Management Yes      Personal Goals and Risk Factors Review:      Goals and Risk Factor Review      01/01/15 1234 01/08/15 1547 01/15/15 1130 01/19/15 1130 01/24/15 1130   Weight Management   Goals Progress/Improvement seen Yes  No     Comments ongoing, would like to lose 150 lbs.  Asked  Mr Sprung if he had an appointment with the dietitian yet; he said he did meet with her but continues to struggle with his diet - large portion sizes and snacking late at night; his weight is averaging  340lbs.  He has been on a [employer]  since 2006 and has helped his cardiac health.     Increase Aerobic Exercise and Physical Activity   Goals Progress/Improvement seen  Yes   Yes    Comments continue to make gains on the treadmill  Mr Aleman states his endurance is increasing. 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 He still needs to find a oxygen portable concentrator that will work for this that would cover the time involved.    Improve shortness of breath with ADL's   Goals Progress/Improvement seen  Yes Yes   Yes   Comments  Mr Cipollone is managing his shortness of breath in [facility] by managing  his oxygen and adjusting the flow rate to 6l/m with exercise and with some activites at home activities   Mr Jindra brought his harmonica in to class and played it for the class. We talked about the benefits of playing the harmonica on inspiratory and expiratory muscle strength and PLB technique.   Breathing Techniques   Goals Progress/Improvement seen  Yes       Comments wants to improve, also incorporate meditation       Increase knowledge of respiratory medications   Goals Progress/Improvement seen   Yes      Comments  Mr Worthing has a good understanding of his Spiriva; he states that he is not sure "it works", but explained the importance of Spiriva, especially with activity.      Stress   Goal To meet with psychosocial counselor for stress and  relaxation information and guidance. To state understanding of performing relaxation techniques and or identifying personal stressors.         01/24/15 1330 01/29/15 1550 03/19/15 1130 03/21/15 1130 03/26/15 1130   Weight Management   Goals Progress/Improvement seen   No     Comments   Mr Howatt has maintained his weight at 340-343. He continues with his Kathryn for his heart disease.     Understand more about Heart/Pulmonary Disease   Goals Progress/Improvement seen  Yes  Yes Yes    Comments Has enjoyed all the education classes, has trouble retaining information or recalling information, but he still enjoys the sessions and feels like they are helping him better manage his disease.   Mr Cuttino brought in his PFT and CT results from recent testing. We discussed his Fev1 and how it related to COPD. I plan to continue reviewing his other results. Mr Mizrahi is very open for  new knowledge about lung disease. I continued to educate Mr Blazejewski on his recent PFT . I explained the FEV1, RV, and DLCO. He is being referred to a pulmonologist which he is very positive about for answers about his lung condition.    Improve shortness of breath with ADL's   Goals Progress/Improvement seen  Yes    Yes   Comments Has not seen much improvement in SOB since last month, but is still happy with his progress in the program.     Mr Theiler has good days and bad days with his breathing. He is adjusting his oxygen with exercise and pacing himself with activites. He is looking forward to consulting with a pulmonologist for more detail about his shortness of breath and breathing.   Breathing Techniques   Goals Progress/Improvement seen  Yes    Yes   Comments Uses PLB when his O2 saturation is low or when he feels short of breath. He especially utilizes this during class while exercising, but also uses it around the house and when he is out.     Mr Yankee uses PLB, especially on the TM and has maintined acceptable O2Sat with this  exercise.   Increase  knowledge of respiratory medications   Goals Progress/Improvement seen   Yes   Yes   Comments  Discussed with Mr Studnicka about use an oral device with his sleep apnea, since he can not tolerate the CPAP masks, he was open to it and gave him a list of dentists in the area who are certiified in the oral devices.   Mr Eriksson is taking his Spiriva daily. So far, the "E" cylinders are his portal system for oxygen.   Diabetes   Goal     --  No problems with his diabete in [facility]   Hypertension   Goal     --  Maintaining acceptable BP at rest wnd with exercise in [facility].   Abnormal Lipids   Goal     --  No problems with cholesterol reading in [facility].   Stress   Goal     --  Mr Molstad has met with the counselor in [place]. I have educated him on his  pulmonary test results and on portable concentrators.     04/02/15 0853 04/04/15 1130 04/11/15 1603 04/18/15 1200 04/18/15 1244   Weight Management   Goals Progress/Improvement seen    Yes --  Prediabetes on metformin. Instructed what HBA!c blood work means since [place] said no one told him to check his blood sugars at home since they do blood work.    Comments    maintaining current weight well.    Increase Aerobic Exercise and Physical Activity   Goals Progress/Improvement seen  No  Yes Yes    Comments Has not seen much improvement, however, has been remaining stable which is key for him at the juncture.  Mr Gueye walked on the TM at 2.0 for the entire 53mns and maintained acceptable O2Sats. maintaing current levels with acceptable responses    Improve shortness of breath with ADL's   Goals Progress/Improvement seen     Yes    Comments    maintaining SOB symptoms.    Breathing Techniques   Goals Progress/Improvement seen     Yes    Comments    continuing to practice PLB with exertion    Increase knowledge of respiratory medications   Goals Progress/Improvement seen   Yes      Comments  Mr OMayorgahas been approved  for a portable concentrator, and [facility] has order the oxygen system.      Hypertension   Goal    Participant will see blood pressure controlled within the values of 140/997mHg or within value directed by their physician.  still receiving acceptable responses --  Blood pressure is stable today and usually.      04/18/15 1544 04/24/15 0727 05/08/15 0844 05/09/15 1130 05/21/15 1302   Weight Management   Goals Progress/Improvement seen     Yes   Comments     Delwin "I walk by a brownie and gain weight". I told RiMumin was impressed after Thanksgiving his weight was down a couple of lbs".    Increase Aerobic Exercise and Physical Activity   Goals Progress/Improvement seen     Yes Yes   Comments    Mr OlGovanmproved [id number] his mid [id number] is now walking on the TM 2.0 with improved O2Sat's on the 8l/m oxygen. He has progressed to L8 on the REL and L7 on the T5 - great progression. " I am able to do a little more and breath easier when I am walking".    Understand more about Heart/Pulmonary  Disease   Goals Progress/Improvement seen  Yes  Yes  Yes   Comments Mr Hasting has an appointment with a pulmonologist on 05/24/2015. He is disappointed that the appointment is not sooner, but hopeful he will learn more about his lung disease.  Mr Rueth brought in his harmonica and instructed me on the breathing techniques for different sounds. He watched a video on the pulmonary harmonica and recommended the regular harmonica for  Chronic Lung patients.     Increase knowledge of respiratory medications   Goals Progress/Improvement seen    Yes     Comments   Mr Vanbenschoten acquired a better O2 Concentrator with the increased intermittent flow to 5l/m which works better for him.  He is thinking about going to a musical jam session locally now that he has this portability with his oxygen.      Diabetes   Goal Blood glucose control identified by blood glucose values, HgbA1C. Participant verbalizes understanding of the  signs/symptoms of hyper/hypo glycemia, proper foot care and importance of medication and nutrition plan for blood glucose control.       Hypertension   Goal     --  Blood pressure is 140/60 and is stable today.    Abnormal Lipids   Goal  Cholesterol controlled with medications as prescribed, with individualized exercise RX and with personalized nutrition plan. Value goals: LDL < 59m, HDL > 414m Participant states understanding of desired cholesterol values and following prescriptions.      Stress   Goal  --  Mr OlMatrangas much more relaxed when he comes to class. He has accomplished many positive changes - more understanding of his lung disease, plus has an appointment with a pulmonologist, he has a better portable oxygen system order .           05/21/15 1538 06/01/15 1516 06/04/15 1130 06/04/15 1236 06/11/15 1217   Weight Management   Goals Progress/Improvement seen     No   Comments     Weight is staying steady, with no loss reported currently   Increase Aerobic Exercise and Physical Activity   Goals Progress/Improvement seen    Yes  Yes   Comments   Mr OlVeselkaas no reached to point that he is looking to exercise more independently in  our FoDillard'shen he graduates from [employer] is very proficent in setting up his oxygen and the exercise equipment.  Maintaining where he is at with exercise currently. Voices that he is ok with this trend currently   Understand more about Heart/Pulmonary Disease   Goals Progress/Improvement seen     Yes    Comments    RiHarels going for a second opinion with a Pulmonary MD since he wants to find out more details about an infection the other MD mentioned. Hrithik is not running a fever but is "coughing up stuff all the time between "butterscotch color and dark chocolate". Taiven doesn't think he has had a bronchoscopy lately.     Improve shortness of breath with ADL's   Goals Progress/Improvement seen  Yes    No   Comments Mr Milham's SOB  Questionnaire improved by 7 points - Minimal difference is 5 points. Mr OlCindrics more aware of his shortness of breath and how it correlates to O2Sat's. He adjusts his oxygen flow to 8l/m with his exercise goals.    SOB seems to be staying consisitent, Not getting    Breathing Techniques   Goals Progress/Improvement seen  Yes Yes      Comments Mr Brancato has good technique with his PLB and is very aware when to use this techniqe with his shortness of breath. Mr. Irish Elders is using good technique with PLB.  He is having to use it more often due to increases in workload, but his O2 sats are WNL.      Diabetes   Goal --  Mr Canipe has not had any problems with his glucose levels in [facility].       Hypertension   Goal     Participant will see blood pressure controlled within the values of 140/33m/Hg or within value directed by their physician.   Stress   Goal     To meet with psychosocial counselor for stress and relaxation information and guidance. To state understanding of performing relaxation techniques and or identifying personal stressors.     06/11/15 1624 06/13/15 1539 06/19/15 1024       Increase Aerobic Exercise and Physical Activity   Goals Progress/Improvement seen  Yes Yes      Comments I showed Mr OSkowthe [facility] gym. He was very interested and will exercise on the XR on Wednesday, so he can use the XR in the FF gym. Set Mr ORantaon the XR which will be a new exercise goal for him and can be carried over to his exercise goals in the FF.      Increase knowledge of respiratory medications   Goals Progress/Improvement seen  Yes       Comments Mr OVallanceis adjusting to his new concentrator, but he is aware that 5l/m intermittent does not maintain acceptable O2Sat's with exercise. His new pulmonologist did put him on a new inhaler and did want a sputum sample.       Diabetes   Goal   --  No issues with glucose levels in [facility]; Mr ORasnicis compliant with his medication.     Abnormal  Lipids   Goal   --  No issues with Cholesterol; Mr OWierzbais compliant with his medication.        Personal Goals Discharge (Final Personal Goals and Risk Factors Review):      Goals and Risk Factor Review - 06/19/15 1024    Diabetes   Goal --  No issues with glucose levels in [facility]; Mr OGastineauis compliant with his medication.   Abnormal Lipids   Goal --  No issues with Cholesterol; Mr OBultmanis compliant with his medication.      ITP Comments:     ITP Comments      06/13/15 1218           ITP Comments Personal and exercise goals expected to be met in 9  more sessions. Progress on specific individualized goals will be charted in patient's ITP. Upon completion of the program the patient will be comfortable managing exercise goals and progression on their own.           Comments: 30 day review note

## 2015-06-20 DIAGNOSIS — J984 Other disorders of lung: Secondary | ICD-10-CM | POA: Insufficient documentation

## 2015-06-27 ENCOUNTER — Encounter: Payer: Medicare Other | Attending: Family Medicine

## 2015-06-27 DIAGNOSIS — J449 Chronic obstructive pulmonary disease, unspecified: Secondary | ICD-10-CM | POA: Insufficient documentation

## 2015-06-27 DIAGNOSIS — J42 Unspecified chronic bronchitis: Secondary | ICD-10-CM | POA: Insufficient documentation

## 2015-07-06 ENCOUNTER — Encounter: Payer: Medicare Other | Admitting: *Deleted

## 2015-07-06 DIAGNOSIS — J449 Chronic obstructive pulmonary disease, unspecified: Secondary | ICD-10-CM | POA: Diagnosis not present

## 2015-07-06 DIAGNOSIS — G4730 Sleep apnea, unspecified: Secondary | ICD-10-CM

## 2015-07-06 DIAGNOSIS — J42 Unspecified chronic bronchitis: Secondary | ICD-10-CM | POA: Diagnosis present

## 2015-07-06 NOTE — Progress Notes (Signed)
Daily Session Note  Patient Details  Name: Miguel Palmer MRN: [id number] Date of Birth: 06-11-1941 Referring Provider:  [name], MD  Encounter Date: 07/06/2015  Check In:     Session Check In - 07/06/15 1239    Check-In   Staff Present Heath Lark, RN, BSN, CCRP;Stacey Blanch Media, RRT, RCP, Respiratory Griffin Basil, MS, ACSM CEP, Exercise Physiologist   ER physicians immediately available to respond to emergencies [facility] immediately available ER MD   Physician(s) Drs: Jacqualine Code and Joni Fears   Medication changes reported     No   Fall or balance concerns reported    No   Warm-up and Cool-down Performed on first and last piece of equipment   VAD Patient? No   Pain Assessment   Currently in Pain? No/denies         Goals Met:  Independence with exercise equipment Exercise tolerated well Strength training completed today  Goals Unmet:  Not Applicable  Goals Comments: Doing well with exercise prescription progression.    Dr. Emily Filbert is Medical Director for [facility] and [facility].

## 2015-07-09 ENCOUNTER — Encounter: Payer: Medicare Other | Admitting: *Deleted

## 2015-07-09 DIAGNOSIS — G4730 Sleep apnea, unspecified: Secondary | ICD-10-CM

## 2015-07-09 DIAGNOSIS — J42 Unspecified chronic bronchitis: Secondary | ICD-10-CM | POA: Diagnosis not present

## 2015-07-09 DIAGNOSIS — J449 Chronic obstructive pulmonary disease, unspecified: Secondary | ICD-10-CM

## 2015-07-09 NOTE — Progress Notes (Signed)
Daily Session Note  Patient Details  Name: Miguel Palmer MRN: [id number] Date of Birth: 05-23-1941 Referring Provider:  [name], MD  Encounter Date: 07/09/2015  Check In:     Session Check In - 07/09/15 1208    Check-In   Staff Present Heath Lark, RN, BSN, CCRP;Laureen Owens Shark, BS, RRT, Respiratory Therapist;Steven Way, BS, ACSM EP-C, Exercise Physiologist   ER physicians immediately available to respond to emergencies [facility] immediately available ER MD   Physician(s) Drs: Dineen Kid and Marcelene Butte   Fall or balance concerns reported    No   Warm-up and Cool-down Performed on first and last piece of equipment   VAD Patient? No   Pain Assessment   Currently in Pain? No/denies           Exercise Prescription Changes - 07/09/15 1200    Exercise Review   Progression Yes   Response to Exercise   Oxygen Saturation (Exit) --  Room Furniture conservator/restorer   Weight 5   Reps 10-12   Interval Training   Interval Training No   Treadmill   MPH 2   Grade 0   Minutes 15   NuStep   Level 7  T5   Watts 70   Minutes 15   Recumbant Elliptical   Level 8   RPM 65   Watts 15   REL-XR   Level 10   Watts 100   Minutes 15      Goals Met:  Independence with exercise equipment Exercise tolerated well Personal goals reviewed Strength training completed today  Goals Unmet:  Not Applicable  Goals Comments: Doing well with exercise prescription progression.    Dr. Emily Filbert is Medical Director for [facility] and [facility].

## 2015-07-16 ENCOUNTER — Encounter: Payer: Medicare Other | Admitting: *Deleted

## 2015-07-16 DIAGNOSIS — G4730 Sleep apnea, unspecified: Secondary | ICD-10-CM

## 2015-07-16 DIAGNOSIS — J42 Unspecified chronic bronchitis: Secondary | ICD-10-CM | POA: Diagnosis not present

## 2015-07-16 DIAGNOSIS — J449 Chronic obstructive pulmonary disease, unspecified: Secondary | ICD-10-CM

## 2015-07-16 NOTE — Progress Notes (Signed)
Daily Session Note  Patient Details  Name: Miguel Palmer MRN: [id number] Date of Birth: [birth year] Referring Provider:  [name], MD  Encounter Date: 07/16/2015  Check In:     Session Check In - 07/16/15 1229    Check-In   Staff Present Carson Myrtle, BS, RRT, Respiratory Therapist;Leveta Wahab, RN, BSN;Steven Way, BS, ACSM EP-C, Exercise Physiologist   ER physicians immediately available to respond to emergencies [facility] immediately available ER MD   Physician(s) Dr. Joni Fears and Dr. Jimmye Norman   Fall or balance concerns reported    No   Warm-up and Cool-down Performed on first and last piece of equipment   VAD Patient? No   Pain Assessment   Currently in Pain? No/denies         Goals Met:  Proper associated with RPD/PD & O2 Sat Exercise tolerated well  Goals Unmet:  Not Applicable  Goals Comments:    Dr. Emily Filbert is Medical Director for [facility] and [facility].

## 2015-07-16 NOTE — Progress Notes (Signed)
Pulmonary Individual Treatment Plan  Patient Details  Name: Miguel Palmer MRN: [id number] Date of Birth: 01/27/1941 Referring Provider:  [name], MD  Initial Encounter Date:    Visit Diagnosis: COPD, mild ([place])  Sleep apnea  Patient's Home Medications on Admission:  Current outpatient prescriptions:  .  aspirin 81 MG tablet, Take 81 mg by mouth every other day., Disp: , Rfl:  .  co-enzyme Q-10 30 MG capsule, Take 30 mg by mouth 2 (two) times daily., Disp: , Rfl:  .  dextroamphetamine (DEXEDRINE SPANSULE) 15 MG 24 hr capsule, Take 15 mg by mouth daily., Disp: , Rfl:  .  gabapentin (NEURONTIN) 400 MG capsule, Take by mouth., Disp: , Rfl:  .  Ginkgo Biloba 60 MG TABS, Take by mouth., Disp: , Rfl:  .  Glucosamine Sulfate 1000 MG CAPS, Take by mouth., Disp: , Rfl:  .  isosorbide mononitrate (IMDUR) 30 MG 24 hr tablet, TAKE 1 TABLET BY MOUTH EVERY DAY, Disp: , Rfl:  .  metFORMIN (GLUCOPHAGE) 500 MG tablet, TAKE 1 TABLET BY MOUTH DAILY., Disp: , Rfl:  .  metoprolol succinate (TOPROL-XL) 25 MG 24 hr tablet, TAKE 1 TABLET BY MOUTH EVERY DAY, Disp: , Rfl:  .  modafinil (PROVIGIL) 200 MG tablet, Take by mouth., Disp: , Rfl:  .  Multiple Vitamins-Minerals (MULTIVITAMIN & MINERAL PO), Take by mouth., Disp: , Rfl:  .  niacin (NIASPAN) 1000 MG CR tablet, Take by mouth., Disp: , Rfl:  .  nitroGLYCERIN (NITROSTAT) 0.4 MG SL tablet, Place under the tongue., Disp: , Rfl:  .  prasugrel (EFFIENT) 10 MG TABS tablet, TAKE 1 TABLET BY MOUTH EVERY DAY, Disp: , Rfl:  .  rosuvastatin (CRESTOR) 20 MG tablet, Take by mouth., Disp: , Rfl:  .  tiotropium (SPIRIVA) 18 MCG inhalation capsule, Place into inhaler and inhale., Disp: , Rfl:   Past Medical History: No past medical history on file.  Tobacco Use: History  Smoking status  . Former Smoker -- 2.00 packs/day for 22 years  . Types: Cigarettes  Smokeless tobacco  . 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       ADL UCSD:     ADL UCSD      02/16/15 1130 02/16/15 1631 05/09/15 1130   ADL UCSD   ADL Phase Entry Exit Mid   SOB Score total 85 85 68   Rest 1 1 0   Walk _0 Stairs _1 Bath _2 Dress _3 Shop _4 Pulmonary Function Assessment:   Exercise Target Goals:    Exercise Program Goal: Individual exercise prescription set with THRR, safety & activity barriers. Participant demonstrates ability to understand and report RPE using BORG scale, to self-measure pulse accurately, and to acknowledge the importance of the exercise prescription.  Exercise Prescription Goal: Starting with aerobic activity 30 plus minutes a day, 3 days per week for initial exercise prescription. Provide home exercise prescription and guidelines that participant acknowledges understanding prior to discharge.  Activity Barriers & Risk Stratification:   6 Minute Walk:     6 Minute Walk      02/09/15 1159 02/09/15 1214 05/09/15 1157   6 Minute Walk   Phase Discharge  Mid Program   Distance 600 feet  915 feet  Walk Time 5 minutes  6 minutes   Resting HR 102 bpm (p) --  Pulse ox on 6 liters before 6 minute walk 98%. 88 bpm   Resting BP 158/88 mmHg  148/80 mmHg   Max Ex. HR 109 bpm  143 bpm   Max Ex. BP 172/94 mmHg  164/86 mmHg   RPE 14  17   Perceived Dyspnea  4 (p) --  Post 6 min pulse ox 88% on 6 liters oxgyen 5   Symptoms   No      Initial Exercise Prescription:   Exercise Prescription Changes:     Exercise Prescription Changes      01/17/15 1500 01/22/15 1300 01/31/15 1200 01/31/15 1400 02/12/15 1200   Exercise Review   Progression _0    Response to Exercise   Blood Pressure (Admit)  118/78 mmHg 140/88 mmHg     Blood Pressure (Exercise)  140/68 mmHg 156/80 mmHg     Blood Pressure (Exit)  122/62 mmHg 118/80 mmHg     Heart Rate (Admit)  80 bpm 88 bpm     Heart Rate (Exercise)  108 bpm 122 bpm      Heart Rate (Exit)  85 bpm 98 bpm     Oxygen Saturation (Admit)  91 %  6l/m continuous 93 %     Oxygen Saturation (Exercise)  86 % 90 %     Oxygen Saturation (Exit)  97 % 94 %     Rating of Perceived Exertion (Exercise)  13 12     Perceived Dyspnea (Exercise)  4 3     Intensity THRR unchanged       Progression Continue progressive overload as per policy without signs/symptoms or physical distress.       Resistance Training   Training Prescription Yes Yes Yes     Weight _1 Reps 10-12 10-12 10-12     Treadmill   MPH 1.5 1.5 1.8     Grade 0 0 0     Minutes _2 NuStep   Level  6 6  T5  7  T5 Nustep   Watts  50 80  60   Minutes  15 15     Recumbant Elliptical   Level  6 6     RPM  50 50     Minutes   15       02/23/15 1500 03/27/15 0900 04/23/15 1100 05/09/15 1200 05/21/15 1621   Exercise Review   Progression  Yes Yes Yes Yes   Response to Exercise   Blood Pressure (Admit) 134/83 mmHg 146/80 mmHg  Data 03/26/15 140/70 mmHg  Data 03/26/15  142/76 mmHg   Blood Pressure (Exercise) 140/80 mmHg 150/70 mmHg 158/74 mmHg  154/70 mmHg   Blood Pressure (Exit) 138/70 mmHg 148/80 mmHg 142/80 mmHg  148/80 mmHg   Heart Rate (Admit) 101 bpm 84 bpm 84 bpm  106 bpm   Heart Rate (Exercise) 118 bpm 127 bpm 124 bpm  115 bpm   Heart Rate (Exit) 97 bpm 103 bpm 93 bpm  93 bpm   Oxygen Saturation (Admit) 91 % 97 % 92 %  95 %   Oxygen Saturation (Exercise) 89 % 88 %  8l/m C 93 %  8l/m C  95 %   Oxygen Saturation (Exit) 99 % 96 % 92 %  96 %   Rating of Perceived Exertion (Exercise) _3 13  Perceived Dyspnea (Exercise) _0 Symptoms   none None None   Comments    Completed the mid program 6MW test and did very well. He improived over 300 feet from when he did his discharge walk in the program previously. These results were reviewed with him and he was very encouraged to hear this.  Completed the mid program 6MW test and did very well. He improived over 300 feet from when  he did his discharge walk in the program previously. These results were reviewed with him and he was very encouraged to hear this.    Frequency  Add 1 additional day to program exercise sessions. Add 1 additional day to program exercise sessions.     Duration  Progress to 50 minutes of aerobic without signs/symptoms of physical distress Progress to 50 minutes of aerobic without signs/symptoms of physical distress Progress to 50 minutes of aerobic without signs/symptoms of physical distress Progress to 50 minutes of aerobic without signs/symptoms of physical distress   Intensity  Rest + 30 Rest + 30 Rest + 30 Rest + 30   Progression  Continue progressive overload as per policy without signs/symptoms or physical distress. Continue progressive overload as per policy without signs/symptoms or physical distress. Continue progressive overload as per policy without signs/symptoms or physical distress. Continue progressive overload as per policy without signs/symptoms or physical distress.   Resistance Training   Training Prescription _1    Weight _2 Reps 10-12 10-12 10-12 10-12 10-12   Interval Training   Interval Training    No No   Treadmill   MPH 1.8 1.9 1.9 1.9 1.9   Grade 0 0 0 0 0   Minutes _3 NuStep   Level 7  T5 Nustep 7  T5 7  T5 7  T5 7  T5   Watts 60 70 70 70 70   Minutes _4 Recumbant Elliptical   Level _5 RPM 50 60 60 60 60   Watts  _6 Minutes 15       Home Exercise Plan   Plans to continue exercise at --  Mr. Germer plans to attend another 36 sessions per MD order.         06/11/15 1600 07/09/15 1200         Exercise Review   Progression Yes Yes      Response to Exercise   Blood Pressure (Admit) 124/60 mmHg 130/64 mmHg      Blood Pressure (Exercise) 168/88 mmHg 178/80 mmHg      Blood Pressure (Exit)  126/74 mmHg      Heart Rate (Admit)  103 bpm      Heart Rate (Exercise) 121 bpm 120 bpm       Heart Rate (Exit) 86 bpm 80 bpm      Oxygen Saturation (Admit) 91 % 94 %      Oxygen Saturation (Exercise) 89 % 94 %      Oxygen Saturation (Exit) 88 %  Room Air 97 %  Room Air      Rating of Perceived Exertion (Exercise)  13      Perceived Dyspnea (Exercise)  4      Symptoms None none      Frequency Add 2 additional days to program exercise sessions.  Duration Progress to 50 minutes of aerobic without signs/symptoms of physical distress Progress to 50 minutes of aerobic without signs/symptoms of physical distress      Intensity Rest + 30 THRR unchanged      Progression Continue progressive overload as per policy without signs/symptoms or physical distress. Continue progressive overload as per policy without signs/symptoms or physical distress.      Resistance Training   Training Prescription Yes       Weight 5 5      Reps 10-12 10-12      Interval Training   Interval Training No No      Treadmill   MPH 2 2      Grade 0 0      Minutes 15 15      NuStep   Level 7  T5 7  T5      Watts 70 70      Minutes 15 15      Recumbant Elliptical   Level 8 8      RPM 65 65      Watts 15 15      REL-XR   Level  10      Watts  100      Minutes  15         Discharge Exercise Prescription (Final Exercise Prescription Changes):     Exercise Prescription Changes - 07/09/15 1200    Exercise Review   Progression Yes   Response to Exercise   Blood Pressure (Admit) 130/64 mmHg   Blood Pressure (Exercise) 178/80 mmHg   Blood Pressure (Exit) 126/74 mmHg   Heart Rate (Admit) 103 bpm   Heart Rate (Exercise) 120 bpm   Heart Rate (Exit) 80 bpm   Oxygen Saturation (Admit) 94 %   Oxygen Saturation (Exercise) 94 %   Oxygen Saturation (Exit) 97 %  Room Air   Rating of Perceived Exertion (Exercise) 13   Perceived Dyspnea (Exercise) 4   Symptoms none   Duration Progress to 50 minutes of aerobic without signs/symptoms of physical distress   Intensity THRR unchanged   Progression  Continue progressive overload as per policy without signs/symptoms or physical distress.   Resistance Training   Weight 5   Reps 10-12   Interval Training   Interval Training No   Treadmill   MPH 2   Grade 0   Minutes 15   NuStep   Level 7  T5   Watts 70   Minutes 15   Recumbant Elliptical   Level 8   RPM 65   Watts 15   REL-XR   Level 10   Watts 100   Minutes 15       Nutrition:  Target Goals: Understanding of nutrition guidelines, daily intake of sodium <1531m, cholesterol <2040m calories 30% from fat and 7% or less from saturated fats, daily to have 5 or more servings of fruits and vegetables.  Biometrics:    Nutrition Therapy Plan and Nutrition Goals:   Nutrition Discharge: Rate Your Plate Scores:   Psychosocial: Target Goals: Acknowledge presence or absence of depression, maximize coping skills, provide positive support system. Participant is able to verbalize types and ability to use techniques and skills needed for reducing stress and depression.  Initial Review & Psychosocial Screening:   Quality of Life Scores:     Quality of Life - 02/19/15 1130    Quality of Life Scores   Health/Function Post 9.63 %   Socioeconomic Post 14.75 %  Psych/Spiritual Post 12.64 %   Family Post 18.6 %   GLOBAL Post 12.69 %      PHQ-9:     Recent Review Flowsheet Data    Depression screen [facility] 2/9 02/19/2015 02/12/2015 10/31/2014   Decreased Interest _0 Down, Depressed, Hopeless _1 PHQ - 2 Score _2 Altered sleeping 0 0 0   Tired, decreased energy _3 Change in appetite _4 Feeling bad or failure about yourself  _5 Trouble concentrating _6 Moving slowly or fidgety/restless _7 Suicidal thoughts _8 PHQ-9 Score _9 Difficult doing work/chores Somewhat difficult - Somewhat difficult      Psychosocial Evaluation and Intervention:   Psychosocial Re-Evaluation:     Psychosocial Re-Evaluation      07/09/15  1250           Psychosocial Re-Evaluation   Comments Counselor follow up with Mr. Brew today reporting this program has been very helpful to him.  He states he has increased stamina and has improved the distance he can walk now since beginning the program.  He plans to maintain his progress by continued workout in the [employer] following discharge from this program.  He continues to have a positive outlook and states he appreciates the support he has recieved here by staff and classmates.           Education: Education Goals: Education classes will be provided on a weekly basis, covering required topics. Participant will state understanding/return demonstration of topics presented.  Learning Barriers/Preferences:   Education Topics: Initial Evaluation Education: - Verbal, written and demonstration of respiratory meds, RPE/PD scales, oximetry and breathing techniques. Instruction on use of nebulizers and MDIs: cleaning and proper use, rinsing mouth with steroid doses and importance of monitoring MDI activations.          Pulmonary Rehab from 07/09/2015 in [facility] Cardiac and Pulmonary Rehab   Date  10/31/14   Educator  LB   Instruction Review Code  2- meets goals/outcomes      General Nutrition Guidelines/Fats and Fiber: -Group instruction provided by verbal, written material, models and posters to present the general guidelines for heart healthy nutrition. Gives an explanation and review of dietary fats and fiber.      Pulmonary Rehab from 07/09/2015 in [facility] Cardiac and Pulmonary Rehab   Date  02/05/15   Educator  C. Joneen Caraway, RD   Instruction Review Code  2- meets goals/outcomes      Controlling Sodium/Reading Food Labels: -Group verbal and written material supporting the discussion of sodium use in heart healthy nutrition. Review and explanation with models, verbal and written materials for utilization of the food label.      Pulmonary Rehab from 07/09/2015 in [facility] Cardiac and  Pulmonary Rehab   Date  02/12/15   Educator  C. Rusell, RD   Instruction Review Code  2- meets goals/outcomes      Exercise Physiology & Risk Factors: - Group verbal and written instruction with models to review the exercise physiology of the cardiovascular system and associated critical values. Details cardiovascular disease risk factors and the goals associated with each risk factor.   Aerobic Exercise & Resistance Training: - Gives group verbal and written discussion on the health impact of inactivity. On the components of aerobic and resistive training programs and  the benefits of this training and how to safely progress through these programs.      Pulmonary Rehab from 07/09/2015 in [facility] Cardiac and Pulmonary Rehab   Date  12/27/14   Educator  SW   Instruction Review Code  2- meets goals/outcomes      Flexibility, Balance, General Exercise Guidelines: - Provides group verbal and written instruction on the benefits of flexibility and balance training programs. Provides general exercise guidelines with specific guidelines to those with heart or lung disease. Demonstration and skill practice provided.      Pulmonary Rehab from 07/09/2015 in [facility] Cardiac and Pulmonary Rehab   Date  01/24/15   Educator  SW   Instruction Review Code  2- meets goals/outcomes      Stress Management: - Provides group verbal and written instruction about the health risks of elevated stress, cause of high stress, and healthy ways to reduce stress.      Pulmonary Rehab from 07/09/2015 in [facility] Cardiac and Pulmonary Rehab   Date  01/17/15   Educator  [facility]   Instruction Review Code  2- meets goals/outcomes      Depression: - Provides group verbal and written instruction on the correlation between heart/lung disease and depressed mood, treatment options, and the stigmas associated with seeking treatment.      Pulmonary Rehab from 07/09/2015 in [facility] Cardiac and Pulmonary Rehab   Date  03/14/15   Educator   Berle Mull, MSW   Instruction Review Code  2- meets goals/outcomes      Exercise & Equipment Safety: - Individual verbal instruction and demonstration of equipment use and safety with use of the equipment.      Pulmonary Rehab from 07/09/2015 in [facility] Cardiac and Pulmonary Rehab   Date  01/15/15   Educator  L. Owens Shark, RRT [facility] Joyce]   Instruction Review Code  2- meets goals/outcomes      Infection Prevention: - Provides verbal and written material to individual with discussion of infection control including proper hand washing and proper equipment cleaning during exercise session.      Pulmonary Rehab from 07/09/2015 in [facility] Cardiac and Pulmonary Rehab   Date  11/03/14   Educator  Jacqulyn Ducking   Instruction Review Code  2- meets goals/outcomes      Falls Prevention: - Provides verbal and written material to individual with discussion of falls prevention and safety.   Diabetes: - Individual verbal and written instruction to review signs/symptoms of diabetes, desired ranges of glucose level fasting, after meals and with exercise. Advice that pre and post exercise glucose checks will be done for 3 sessions at entry of program.      Pulmonary Rehab from 07/09/2015 in [facility] Cardiac and Pulmonary Rehab   Date  02/23/15 [facility] your numbers]   Educator  C. Alissandra Geoffroy   Instruction Review Code  2- meets goals/outcomes      Chronic Lung Diseases: - Group verbal and written instruction to review new updates, new respiratory medications, new advancements in procedures and treatments. Provide informative websites and "800" numbers of self-education.      Pulmonary Rehab from 07/09/2015 in [facility] Cardiac and Pulmonary Rehab   Date  04/30/15   Educator  LB   Instruction Review Code  2- meets goals/outcomes      Lung Procedures: - Group verbal and written instruction to describe testing methods done to diagnose lung disease. Review the outcome of test results. Describe the treatment choices:  Pulmonary Function Tests, ABGs and oximetry.  Pulmonary Rehab from 07/09/2015 in [facility] Cardiac and Pulmonary Rehab   Date  01/26/15   Educator  [place]   Instruction Review Code  2- meets goals/outcomes      Energy Conservation: - Provide group verbal and written instruction for methods to conserve energy, plan and organize activities. Instruct on pacing techniques, use of adaptive equipment and posture/positioning to relieve shortness of breath.      Pulmonary Rehab from 07/09/2015 in [facility] Cardiac and Pulmonary Rehab   Date  02/07/15   Educator  SW   Instruction Review Code  2- meets goals/outcomes      Triggers: - Group verbal and written instruction to review types of environmental controls: home humidity, furnaces, filters, dust mite/pet prevention, HEPA vacuums. To discuss weather changes, air quality and the benefits of nasal washing.      Pulmonary Rehab from 07/09/2015 in [facility] Cardiac and Pulmonary Rehab   Date  07/09/15   Educator  LB   Instruction Review Code  2- meets goals/outcomes      Exacerbations: - Group verbal and written instruction to provide: warning signs, infection symptoms, calling MD promptly, preventive modes, and value of vaccinations. Review: effective airway clearance, coughing and/or vibration techniques. Create an Sports administrator.   Oxygen: - Individual and group verbal and written instruction on oxygen therapy. Includes supplement oxygen, available portable oxygen systems, continuous and intermittent flow rates, oxygen safety, concentrators, and Medicare reimbursement for oxygen.      Pulmonary Rehab from 07/09/2015 in [facility] Cardiac and Pulmonary Rehab   Date  10/31/14   Educator  LB   Instruction Review Code  2- meets goals/outcomes      Respiratory Medications: - Group verbal and written instruction to review medications for lung disease. Drug class, frequency, complications, importance of spacers, rinsing mouth after steroid MDI's, and proper cleaning  methods for nebulizers.      Pulmonary Rehab from 07/09/2015 in [facility] Cardiac and Pulmonary Rehab   Date  10/31/14   Educator  LB   Instruction Review Code  2- meets goals/outcomes      AED/CPR: - Group verbal and written instruction with the use of models to demonstrate the basic use of the AED with the basic ABC's of resuscitation.      Pulmonary Rehab from 07/09/2015 in [facility] Cardiac and Pulmonary Rehab   Date  02/02/15   Educator  CE   Instruction Review Code  2- meets goals/outcomes      Breathing Retraining: - Provides individuals verbal and written instruction on purpose, frequency, and proper technique of diaphragmatic breathing and pursed-lipped breathing. Applies individual practice skills.      Pulmonary Rehab from 07/09/2015 in [facility] Cardiac and Pulmonary Rehab   Date  10/31/14   Educator  LB   Instruction Review Code  R- Review/reinforce      Anatomy and Physiology of the Lungs: - Group verbal and written instruction with the use of models to provide basic lung anatomy and physiology related to function, structure and complications of lung disease.   Heart Failure: - Group verbal and written instruction on the basics of heart failure: signs/symptoms, treatments, explanation of ejection fraction, enlarged heart and cardiomyopathy.      Pulmonary Rehab from 07/09/2015 in [facility] Cardiac and Pulmonary Rehab   Date  01/12/15   Educator  Frederich Cha, RRT   Instruction Review Code  2- meets goals/outcomes      Sleep Apnea: - Individual verbal and written instruction to review Obstructive Sleep Apnea. Review  of risk factors, methods for diagnosing and types of masks and machines for OSA.   Anxiety: - Provides group, verbal and written instruction on the correlation between heart/lung disease and anxiety, treatment options, and management of anxiety.      Pulmonary Rehab from 07/09/2015 in [facility] Cardiac and Pulmonary Rehab   Date  02/14/15   Educator  Berle Mull, MSW    Instruction Review Code  2- Meets goals/outcomes      Relaxation: - Provides group, verbal and written instruction about the benefits of relaxation for patients with heart/lung disease. Also provides patients with examples of relaxation techniques.      Pulmonary Rehab from 07/09/2015 in [facility] Cardiac and Pulmonary Rehab   Date  05/23/15   Educator  Lucianne Lei LCSW   Instruction Review Code  2- Meets goals/outcomes      Knowledge Questionnaire Score:     Knowledge Questionnaire Score - 02/20/15 1018    Knowledge Questionnaire Score   Post Score -1      Personal Goals and Risk Factors at Admission:     Personal Goals and Risk Factors at Admission - 03/02/15 1130    Personal Goals and Risk Factors on Admission    Weight Management Yes      Personal Goals and Risk Factors Review:      Goals and Risk Factor Review      01/19/15 1130 01/24/15 1130 01/24/15 1330 01/29/15 1550 03/19/15 1130   Weight Management   Goals Progress/Improvement seen     No   Comments     Mr Earnhart has maintained his weight at Miguel. He continues with his [place] for his heart disease.   Increase Aerobic Exercise and Physical Activity   Goals Progress/Improvement seen  Yes       Comments Mr Townley states his endurance is increasing. 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 He still needs to find a oxygen portable concentrator that will work for this that would cover the time involved.       Understand more about Heart/Pulmonary Disease   Goals Progress/Improvement seen    Yes  Yes   Comments   Has enjoyed all the education classes, has trouble retaining information or recalling information, but he still enjoys the sessions and feels like they are helping him better manage his disease.   Mr Branscom brought in his PFT and CT results from recent testing. We discussed his Fev1 and how it related to COPD. I plan to continue reviewing his other  results. Mr Magnone is very open for  new knowledge about lung disease.   Improve shortness of breath with ADL's   Goals Progress/Improvement seen   Yes Yes     Comments  Mr Cornelio brought his harmonica in to class and played it for the class. We talked about the benefits of playing the harmonica on inspiratory and expiratory muscle strength and PLB technique. Has not seen much improvement in SOB since last month, but is still happy with his progress in the program.      Breathing Techniques   Goals Progress/Improvement seen    Yes     Comments   Uses PLB when his O2 saturation is low or when he feels short of breath. He especially utilizes this during class while exercising, but also uses it around the house and when he is out.      Increase knowledge of respiratory medications  Goals Progress/Improvement seen     Yes    Comments    Discussed with Mr Frisk about use an oral device with his sleep apnea, since he can not tolerate the CPAP masks, he was open to it and gave him a list of dentists in the area who are certiified in the oral devices.      03/21/15 1130 03/26/15 1130 04/02/15 0853 04/04/15 1130 04/11/15 1603   Increase Aerobic Exercise and Physical Activity   Goals Progress/Improvement seen    No  Yes   Comments   Has not seen much improvement, however, has been remaining stable which is key for him at the juncture.  Mr Bidinger walked on the TM at 2.0 for the entire 63mns and maintained acceptable O2Sats.   Understand more about Heart/Pulmonary Disease   Goals Progress/Improvement seen  Yes       Comments I continued to educate Mr OPrimeauon his recent PFT . I explained the FEV1, RV, and DLCO. He is being referred to a pulmonologist which he is very positive about for answers about his lung condition.       Improve shortness of breath with ADL's   Goals Progress/Improvement seen   Yes      Comments  Mr OWherryhas good days and bad days with his breathing. He is adjusting his oxygen with  exercise and pacing himself with activites. He is looking forward to consulting with a pulmonologist for more detail about his shortness of breath and breathing.      Breathing Techniques   Goals Progress/Improvement seen   Yes      Comments  Mr OCybulskiuses PLB, especially on the TM and has maintined acceptable O2Sat with this exercise.      Increase knowledge of respiratory medications   Goals Progress/Improvement seen   Yes  Yes    Comments  Mr OWeyenbergis taking his Spiriva daily. So far, the "E" cylinders are his portal system for oxygen.  Mr OTuccillohas been approved for a portable concentrator, and [facility] has order the oxygen system.    Diabetes   Goal  --  No problems with his diabete in [facility]      Hypertension   Goal  --  Maintaining acceptable BP at rest wnd with exercise in [facility].      Abnormal Lipids   Goal  --  No problems with cholesterol reading in [facility].      Stress   Goal  --  Mr OWiebelhaushas met with the counselor in [place] I have educated him on his  pulmonary test results and on portable concentrators.        04/18/15 1200 04/18/15 1244 04/18/15 1544 04/24/15 0727 05/08/15 0844   Weight Management   Goals Progress/Improvement seen Yes --  Prediabetes on metformin. Instructed what HBA!c blood work means since RButte Meadowssaid no one told him to check his blood sugars at home since they do blood work.       Comments maintaining current weight well.       Increase Aerobic Exercise and Physical Activity   Goals Progress/Improvement seen  Yes       Comments maintaing current levels with acceptable responses       Understand more about Heart/Pulmonary Disease   Goals Progress/Improvement seen    Yes  Yes   Comments   Mr OVillamorhas an appointment with a pulmonologist on 05/24/2015. He is disappointed that the appointment is not sooner, but hopeful  he will learn more about his lung disease.  Mr Bucklin brought in his harmonica and instructed me on the breathing techniques for  different sounds. He watched a video on the pulmonary harmonica and recommended the regular harmonica for  Chronic Lung patients.   Improve shortness of breath with ADL's   Goals Progress/Improvement seen  Yes       Comments maintaining SOB symptoms.       Breathing Techniques   Goals Progress/Improvement seen  Yes       Comments continuing to practice PLB with exertion       Increase knowledge of respiratory medications   Goals Progress/Improvement seen      Yes   Comments     Mr Mcclenton acquired a better O2 Concentrator with the increased intermittent flow to 5l/m which works better for him.  He is thinking about going to a musical jam session locally now that he has this portability with his oxygen.    Diabetes   Goal   Blood glucose control identified by blood glucose values, HgbA1C. Participant verbalizes understanding of the signs/symptoms of hyper/hypo glycemia, proper foot care and importance of medication and nutrition plan for blood glucose control.     Hypertension   Goal Participant will see blood pressure controlled within the values of 140/29m/Hg or within value directed by their physician.  still receiving acceptable responses --  Blood pressure is stable today and usually.       Abnormal Lipids   Goal    Cholesterol controlled with medications as prescribed, with individualized exercise RX and with personalized nutrition plan. Value goals: LDL < 714m HDL > 4072mParticipant states understanding of desired cholesterol values and following prescriptions.    Stress   Goal    --  Mr OlsLoiseau much more relaxed when he comes to class. He has accomplished many positive changes - more understanding of his lung disease, plus has an appointment with a pulmonologist, he has a better portable oxygen system order .         05/09/15 1130 05/21/15 1302 05/21/15 1538 06/01/15 1516 06/04/15 1130   Weight Management   Goals Progress/Improvement seen  Yes      Comments  Hilman "I walk by a  brownie and gain weight". I told RicDhanushwas impressed after Thanksgiving his weight was down a couple of lbs".       Increase Aerobic Exercise and Physical Activity   Goals Progress/Improvement seen  Yes Yes   Yes   Comments Mr OlsVasekproved [id number] his mid [id number] is now walking on the TM 2.0 with improved O2Sat's on the 8l/m oxygen. He has progressed to L8 on the REL and L7 on the T5 - great progression. " I am able to do a little more and breath easier when I am walking".    Mr OlsonKuhlmanno reached to point that he is looking to exercise more independently in  our [employer] he graduates from [facility]. [employer] Jr[employer] Companyis very proficent in setting up his oxygen and the exercise equipment.   Understand more about Heart/Pulmonary Disease   Goals Progress/Improvement seen   Yes      Improve shortness of breath with ADL's   Goals Progress/Improvement seen    Yes     Comments   Mr Erno's SOB Questionnaire improved by 7 points - Minimal difference is 5 points. Mr OlsonHustonore aware of his shortness of breath and how it correlates  to O2Sat's. He adjusts his oxygen flow to 8l/m with his exercise goals.     Breathing Techniques   Goals Progress/Improvement seen    Yes Yes    Comments   Mr Willert has good technique with his PLB and is very aware when to use this techniqe with his shortness of breath. Mr. Irish Elders is using good technique with PLB.  He is having to use it more often due to increases in workload, but his O2 sats are WNL.    Diabetes   Goal   --  Mr Bolle has not had any problems with his glucose levels in [facility].     Hypertension   Goal  --  Blood pressure is 140/60 and is stable today.         06/04/15 1236 06/11/15 1217 06/11/15 1624 06/13/15 1539 06/19/15 1024   Weight Management   Goals Progress/Improvement seen  No      Comments  Weight is staying steady, with no loss reported currently      Increase Aerobic Exercise and Physical Activity   Goals Progress/Improvement seen   Yes  Yes Yes    Comments  Maintaining where he is at with exercise currently. Voices that he is ok with this trend currently I showed Mr Gage the [facility]. He was very interested and will exercise on the XR on Wednesday, so he can use the XR in the [facility] gym. Set Mr Mathey on the XR which will be a new exercise goal for him and can be carried over to his exercise goals in the FF.    Understand more about Heart/Pulmonary Disease   Goals Progress/Improvement seen  Yes       Comments Donovyn is going for a second opinion with a Pulmonary MD since he wants to find out more details about an infection the other MD mentioned. Edith is not running a fever but is "coughing up stuff all the time between "butterscotch color and dark chocolate". Romani doesn't think he has had a bronchoscopy lately.        Improve shortness of breath with ADL's   Goals Progress/Improvement seen   No      Comments  SOB seems to be staying consisitent, Not getting       Increase knowledge of respiratory medications   Goals Progress/Improvement seen    Yes     Comments   Mr Kloth is adjusting to his new concentrator, but he is aware that 5l/m intermittent does not maintain acceptable O2Sat's with exercise. His new pulmonologist did put him on a new inhaler and did want a sputum sample.     Diabetes   Goal     --  No issues with glucose levels in [facility]; Mr Pott is compliant with his medication.   Hypertension   Goal  Participant will see blood pressure controlled within the values of 140/81m/Hg or within value directed by their physician.      Abnormal Lipids   Goal     --  No issues with Cholesterol; Mr OIsabellis compliant with his medication.   Stress   Goal  To meet with psychosocial counselor for stress and relaxation information and guidance. To state understanding of performing relaxation techniques and or identifying personal stressors.        07/09/15 1130           Weight Management   Goals  Progress/Improvement seen No       Comments Mr OHaffey  weight has remained in the 338 to 343 range during [facility]. He continues with his Vegar diet.       Increase Aerobic Exercise and Physical Activity   Goals Progress/Improvement seen  Yes       Comments Mr Pinheiro has increased his exercise goals, exercises independently, and adjusts his oxygen to 6-8l/m with his exercise.       Understand more about Heart/Pulmonary Disease   Goals Progress/Improvement seen  Yes       Comments Mr Honea has an appointment with another pulmonologist for a second opinion. 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 His appointment is a [facility].       Improve shortness of breath with ADL's   Goals Progress/Improvement seen  Yes       Comments Mr Kingry's shortness of breath has improved with activity. He adjusts his oxygen independently and paces himself with activity. He also uses his oximeter for home monitoring.       Breathing Techniques   Goals Progress/Improvement seen  Yes       Comments Good technique with PLB       Increase knowledge of respiratory medications   Goals Progress/Improvement seen  Yes       Comments Mr Ju has adjusted to the new portable concentrator, although he knows the difference and benefit of continuous flow which he does not have with this concentrator.       Diabetes   Goal --  Mr Blossom Hoops has no problems with his glucose levels in [facility] and is compliant with his medicine.       Hypertension   Goal --  Mr Arnett has maintained acceptable BP readings and is compliant with his BP medicine.          Personal Goals Discharge (Final Personal Goals and Risk Factors Review):      Goals and Risk Factor Review - 07/09/15 1130    Weight Management   Goals Progress/Improvement seen No   Comments Mr Patron's weight has remained in the 338 to 343 range during [facility]. He continues with his Vegar diet.   Increase Aerobic Exercise and Physical Activity   Goals  Progress/Improvement seen  Yes   Comments Mr Denn has increased his exercise goals, exercises independently, and adjusts his oxygen to 6-8l/m with his exercise.   Understand more about Heart/Pulmonary Disease   Goals Progress/Improvement seen  Yes   Comments Mr Aspinall has an appointment with another pulmonologist for a second opinion. 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 His appointment is a [facility].   Improve shortness of breath with ADL's   Goals Progress/Improvement seen  Yes   Comments Mr Proehl's shortness of breath has improved with activity. He adjusts his oxygen independently and paces himself with activity. He also uses his oximeter for home monitoring.   Breathing Techniques   Goals Progress/Improvement seen  Yes   Comments Good technique with PLB   Increase knowledge of respiratory medications   Goals Progress/Improvement seen  Yes   Comments Mr Galea has adjusted to the new portable concentrator, although he knows the difference and benefit of continuous flow which he does not have with this concentrator.   Diabetes   Goal --  Mr Blossom Hoops has no problems with his glucose levels in [facility] and is compliant with his medicine.   Hypertension   Goal --  Mr Reinhardt has maintained acceptable BP readings and is compliant with his BP  medicine.      ITP Comments:     ITP Comments      06/13/15 1218 07/06/15 1240 07/06/15 1427 07/09/15 1257     ITP Comments Personal and exercise goals expected to be met in 9  more sessions. Progress on specific individualized goals will be charted in patient's ITP. Upon completion of the program the patient will be comfortable managing exercise goals and progression on their own.  Expected to meet goals in the next 7 days. Expected to meet goals in the next 7 visits Personal and Exercsie goals expeted to be met in    more sessions. Progress on specific goals will be charted in the ITP.        Comments: 30 day review

## 2015-07-18 ENCOUNTER — Encounter: Payer: Medicare Other | Admitting: *Deleted

## 2015-07-18 DIAGNOSIS — G4730 Sleep apnea, unspecified: Secondary | ICD-10-CM

## 2015-07-18 DIAGNOSIS — J42 Unspecified chronic bronchitis: Secondary | ICD-10-CM | POA: Diagnosis not present

## 2015-07-18 DIAGNOSIS — J449 Chronic obstructive pulmonary disease, unspecified: Secondary | ICD-10-CM

## 2015-07-18 NOTE — Progress Notes (Signed)
Daily Session Note  Patient Details  Name: Miguel Palmer MRN: [id number] Date of Birth: 08/16/1940 Referring Provider:  Klein, David, MD  Encounter Date: 07/18/2015  Check In:     Session Check In - 07/18/15 1202    Check-In   Staff Present  , RN, BSN;Steven Way, BS, ACSM EP-C, Exercise Physiologist;Laureen Brown, BS, RRT, Respiratory Therapist   ER physicians immediately available to respond to emergencies [facility] immediately available ER MD   Physician(s) Dr. Schaevitz and Dr. Norman   Medication changes reported     No   Fall or balance concerns reported    No   Warm-up and Cool-down Performed on first and last piece of equipment   VAD Patient? No   Pain Assessment   Currently in Pain? No/denies         Goals Met:  Proper associated with RPD/PD & O2 Sat Exercise tolerated well  Goals Unmet:  Not Applicable  Goals Comments:  Expected to complete goals in 4 more sessions when Hermen will be at 36 sessions.    Dr. Mark Miller is Medical Director for [facility] and [facility]. 

## 2015-07-23 ENCOUNTER — Encounter: Payer: Medicare Other | Admitting: *Deleted

## 2015-07-23 DIAGNOSIS — J42 Unspecified chronic bronchitis: Secondary | ICD-10-CM | POA: Diagnosis not present

## 2015-07-23 DIAGNOSIS — G4730 Sleep apnea, unspecified: Secondary | ICD-10-CM

## 2015-07-23 DIAGNOSIS — J449 Chronic obstructive pulmonary disease, unspecified: Secondary | ICD-10-CM

## 2015-07-23 NOTE — Progress Notes (Signed)
Daily Session Note  Patient Details  Name: Miguel Palmer MRN: [id number] Date of Birth: 1940-07-16 Referring Provider:  [name], MD  Encounter Date: 07/23/2015  Check In:     Session Check In - 07/23/15 1217    Check-In   Staff Present Carson Myrtle, BS, RRT, Respiratory Therapist;Irania Durell, RN, BSN;Steven Way, BS, ACSM EP-C, Exercise Physiologist   ER physicians immediately available to respond to emergencies [facility] immediately available ER MD   Physician(s) Dr. Thomasene Lot and Archie Balboa   Medication changes reported     No   Fall or balance concerns reported    No   Warm-up and Cool-down Performed on first and last piece of equipment   VAD Patient? No   Pain Assessment   Currently in Pain? No/denies         Goals Met:  Proper associated with RPD/PD & O2 Sat Exercise tolerated well  Goals Unmet:  Not Applicable  Goals Comments:    Dr. Emily Filbert is Medical Director for [facility] and [facility].

## 2015-07-25 ENCOUNTER — Encounter: Payer: Medicare Other | Attending: Family Medicine | Admitting: *Deleted

## 2015-07-25 VITALS — Ht 74.0 in

## 2015-07-25 DIAGNOSIS — J449 Chronic obstructive pulmonary disease, unspecified: Secondary | ICD-10-CM

## 2015-07-25 DIAGNOSIS — J42 Unspecified chronic bronchitis: Secondary | ICD-10-CM | POA: Insufficient documentation

## 2015-07-25 DIAGNOSIS — G4730 Sleep apnea, unspecified: Secondary | ICD-10-CM

## 2015-07-25 NOTE — Progress Notes (Signed)
Pulmonary Individual Treatment Plan  Patient Details  Name: Miguel Palmer MRN: [id number] Date of Birth: 1940/10/25 Referring Provider:  [name], MD  Initial Encounter Date:    Visit Diagnosis: Sleep apnea  COPD, mild ([place])  Patient's Home Medications on Admission:  Current outpatient prescriptions:  .  aspirin 81 MG tablet, Take 81 mg by mouth every other day., Disp: , Rfl:  .  co-enzyme Q-10 30 MG capsule, Take 30 mg by mouth 2 (two) times daily., Disp: , Rfl:  .  dextroamphetamine (DEXEDRINE SPANSULE) 15 MG 24 hr capsule, Take 15 mg by mouth daily., Disp: , Rfl:  .  gabapentin (NEURONTIN) 400 MG capsule, Take by mouth., Disp: , Rfl:  .  Ginkgo Biloba 60 MG TABS, Take by mouth., Disp: , Rfl:  .  Glucosamine Sulfate 1000 MG CAPS, Take by mouth., Disp: , Rfl:  .  isosorbide mononitrate (IMDUR) 30 MG 24 hr tablet, TAKE 1 TABLET BY MOUTH EVERY DAY, Disp: , Rfl:  .  metFORMIN (GLUCOPHAGE) 500 MG tablet, TAKE 1 TABLET BY MOUTH DAILY., Disp: , Rfl:  .  metoprolol succinate (TOPROL-XL) 25 MG 24 hr tablet, TAKE 1 TABLET BY MOUTH EVERY DAY, Disp: , Rfl:  .  modafinil (PROVIGIL) 200 MG tablet, Take by mouth., Disp: , Rfl:  .  Multiple Vitamins-Minerals (MULTIVITAMIN & MINERAL PO), Take by mouth., Disp: , Rfl:  .  niacin (NIASPAN) 1000 MG CR tablet, Take by mouth., Disp: , Rfl:  .  nitroGLYCERIN (NITROSTAT) 0.4 MG SL tablet, Place under the tongue., Disp: , Rfl:  .  prasugrel (EFFIENT) 10 MG TABS tablet, TAKE 1 TABLET BY MOUTH EVERY DAY, Disp: , Rfl:  .  rosuvastatin (CRESTOR) 20 MG tablet, Take by mouth., Disp: , Rfl:  .  tiotropium (SPIRIVA) 18 MCG inhalation capsule, Place into inhaler and inhale., Disp: , Rfl:   Past Medical History: No past medical history on file.  Tobacco Use: History  Smoking status  . Former Smoker -- 2.00 packs/day for 22 years  . Types: Cigarettes  Smokeless tobacco  . 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       ADL UCSD:     ADL UCSD      02/16/15 1130 02/16/15 1631 05/09/15 1130   ADL UCSD   ADL Phase Entry Exit Mid   SOB Score total 85 85 68   Rest 1 1 0   Walk 3 2 3    Stairs 4 4 4    Bath 3 3 4    Dress 3 3 3    Shop 5 5 4      07/18/15 1130       ADL UCSD   ADL Phase Exit     SOB Score total 70     Rest 0     Walk 3     Stairs 4     Bath 4     Dress 3     Shop 4         Pulmonary Function Assessment:   Exercise Target Goals:    Exercise Program Goal: Individual exercise prescription set with THRR, safety & activity barriers. Participant demonstrates ability to understand and report RPE using BORG scale, to self-measure pulse accurately, and to acknowledge the importance of the exercise prescription.  Exercise Prescription Goal: Starting with aerobic activity 30 plus minutes a day, 3 days per week for initial exercise  prescription. Provide home exercise prescription and guidelines that participant acknowledges understanding prior to discharge.  Activity Barriers & Risk Stratification:   6 Minute Walk:     6 Minute Walk      02/09/15 1159 02/09/15 1214 05/09/15 1157   6 Minute Walk   Phase Discharge  Mid Program   Distance 600 feet  915 feet   Walk Time 5 minutes  6 minutes   Resting HR 102 bpm (p) --  Pulse ox on 6 liters before 6 minute walk 98%. 88 bpm   Resting BP 158/88 mmHg  148/80 mmHg   Max Ex. HR 109 bpm  143 bpm   Max Ex. BP 172/94 mmHg  164/86 mmHg   RPE 14  17   Perceived Dyspnea  4 (p) --  Post 6 min pulse ox 88% on 6 liters oxgyen 5   Symptoms   No      Initial Exercise Prescription:   Exercise Prescription Changes:     Exercise Prescription Changes      01/31/15 1200 01/31/15 1400 02/12/15 1200 02/23/15 1500 03/27/15 0900   Exercise Review   Progression Yes Yes Yes  Yes   Response to Exercise   Blood Pressure (Admit) 140/88 mmHg   134/83 mmHg 146/80 mmHg  Data 03/26/15   Blood  Pressure (Exercise) 156/80 mmHg   140/80 mmHg 150/70 mmHg   Blood Pressure (Exit) 118/80 mmHg   138/70 mmHg 148/80 mmHg   Heart Rate (Admit) 88 bpm   101 bpm 84 bpm   Heart Rate (Exercise) 122 bpm   118 bpm 127 bpm   Heart Rate (Exit) 98 bpm   97 bpm 103 bpm   Oxygen Saturation (Admit) 93 %   91 % 97 %   Oxygen Saturation (Exercise) 90 %   89 % 88 %  8l/m C   Oxygen Saturation (Exit) 94 %   99 % 96 %   Rating of Perceived Exertion (Exercise) 12   13 13    Perceived Dyspnea (Exercise) 3   4 4    Frequency     Add 1 additional day to program exercise sessions.   Duration     Progress to 50 minutes of aerobic without signs/symptoms of physical distress   Intensity     Rest + 30   Progression     Continue progressive overload as per policy without signs/symptoms or physical distress.   Resistance Training   Training Prescription Yes   Yes Yes   Weight 5   5 5    Reps 10-12   10-12 10-12   Treadmill   MPH 1.8   1.8 1.9   Grade 0   0 0   Minutes 15   15 15    NuStep   Level 6  T5  7  T5 Nustep 7  T5 Nustep 7  T5   Watts 80  60 60 70   Minutes 15   15 15    Recumbant Elliptical   Level 6   6 7    RPM 50   50 60   Watts     15   Minutes 15   15    Home Exercise Plan   Plans to continue exercise at    --  Miguel Palmer plans to attend another 36 sessions per MD order.      04/23/15 1100 05/09/15 1200 05/21/15 1621 06/11/15 1600 07/09/15 1200   Exercise Review   Progression Yes Yes Yes Yes Yes   Response  to Exercise   Blood Pressure (Admit) 140/70 mmHg  Data 03/26/15  142/76 mmHg 124/60 mmHg 130/64 mmHg   Blood Pressure (Exercise) 158/74 mmHg  154/70 mmHg 168/88 mmHg 178/80 mmHg   Blood Pressure (Exit) 142/80 mmHg  148/80 mmHg  126/74 mmHg   Heart Rate (Admit) 84 bpm  106 bpm  103 bpm   Heart Rate (Exercise) 124 bpm  115 bpm 121 bpm 120 bpm   Heart Rate (Exit) 93 bpm  93 bpm 86 bpm 80 bpm   Oxygen Saturation (Admit) 92 %  95 % 91 % 94 %   Oxygen Saturation (Exercise) 93 %  8l/m C  95 %  89 % 94 %   Oxygen Saturation (Exit) 92 %  96 % 88 %  Room Air 97 %  Room Air   Rating of Perceived Exertion (Exercise) 13  13  13    Perceived Dyspnea (Exercise) 4  4  4    Symptoms none None None None none   Comments  Completed the mid program 6MW test and did very well. He improived over 300 feet from when he did his discharge walk in the program previously. These results were reviewed with him and he was very encouraged to hear this.  Completed the mid program 6MW test and did very well. He improived over 300 feet from when he did his discharge walk in the program previously. These results were reviewed with him and he was very encouraged to hear this.      Frequency Add 1 additional day to program exercise sessions.   Add 2 additional days to program exercise sessions.    Duration Progress to 50 minutes of aerobic without signs/symptoms of physical distress Progress to 50 minutes of aerobic without signs/symptoms of physical distress Progress to 50 minutes of aerobic without signs/symptoms of physical distress Progress to 50 minutes of aerobic without signs/symptoms of physical distress Progress to 50 minutes of aerobic without signs/symptoms of physical distress   Intensity Rest + 30 Rest + 30 Rest + 30 Rest + 30 THRR unchanged   Progression Continue progressive overload as per policy without signs/symptoms or physical distress. Continue progressive overload as per policy without signs/symptoms or physical distress. Continue progressive overload as per policy without signs/symptoms or physical distress. Continue progressive overload as per policy without signs/symptoms or physical distress. Continue progressive overload as per policy without signs/symptoms or physical distress.   Resistance Training   Training Prescription Yes Yes Yes Yes    Weight 5 5 5 5 5    Reps 10-12 10-12 10-12 10-12 10-12   Interval Training   Interval Training  No No No No   Treadmill   MPH 1.9 1.9 1.9 2 2    Grade 0 0  0 0 0   Minutes 15 15 15 15 15    NuStep   Level 7  T5 7  T5 7  T5 7  T5 7  T5   Watts 70 70 70 70 70   Minutes 15 15 15 15 15    Recumbant Elliptical   Level 7 7 7 8 8    RPM 60 60 60 65 65   Watts 15 15 15 15 15    REL-XR   Level     10   Watts     100   Minutes     15     07/23/15 1200           Exercise Review   Progression Yes  Response to Exercise   Blood Pressure (Admit) 130/64 mmHg       Blood Pressure (Exercise) 178/80 mmHg       Blood Pressure (Exit) 126/74 mmHg       Heart Rate (Admit) 103 bpm       Heart Rate (Exercise) 120 bpm       Heart Rate (Exit) 80 bpm       Oxygen Saturation (Admit) 94 %       Oxygen Saturation (Exercise) 94 %       Oxygen Saturation (Exit) 97 %  Room Air       Rating of Perceived Exertion (Exercise) 13       Perceived Dyspnea (Exercise) 4       Symptoms none       Duration Progress to 50 minutes of aerobic without signs/symptoms of physical distress       Intensity THRR unchanged       Progression Continue progressive overload as per policy without signs/symptoms or physical distress.       Resistance Training   Weight 5       Reps 10-12       Interval Training   Interval Training No       Treadmill   MPH 2       Grade 0       Minutes 15       NuStep   Level 7  T5       Watts 70       Minutes 15       Recumbant Elliptical   Level 8       RPM 75       Watts 15       REL-XR   Level 10       Watts 100       Minutes 15          Discharge Exercise Prescription (Final Exercise Prescription Changes):     Exercise Prescription Changes - 07/23/15 1200    Exercise Review   Progression Yes   Response to Exercise   Blood Pressure (Admit) 130/64 mmHg   Blood Pressure (Exercise) 178/80 mmHg   Blood Pressure (Exit) 126/74 mmHg   Heart Rate (Admit) 103 bpm   Heart Rate (Exercise) 120 bpm   Heart Rate (Exit) 80 bpm   Oxygen Saturation (Admit) 94 %   Oxygen Saturation (Exercise) 94 %   Oxygen Saturation (Exit) 97 %   Room Air   Rating of Perceived Exertion (Exercise) 13   Perceived Dyspnea (Exercise) 4   Symptoms none   Duration Progress to 50 minutes of aerobic without signs/symptoms of physical distress   Intensity THRR unchanged   Progression Continue progressive overload as per policy without signs/symptoms or physical distress.   Resistance Training   Weight 5   Reps 10-12   Interval Training   Interval Training No   Treadmill   MPH 2   Grade 0   Minutes 15   NuStep   Level 7  T5   Watts 70   Minutes 15   Recumbant Elliptical   Level 8   RPM 75   Watts 15   REL-XR   Level 10   Watts 100   Minutes 15       Nutrition:  Target Goals: Understanding of nutrition guidelines, daily intake of sodium <1537m, cholesterol <2070m calories 30% from fat and 7% or less from saturated fats, daily to have 5 or more  servings of fruits and vegetables.  Biometrics:    Nutrition Therapy Plan and Nutrition Goals:   Nutrition Discharge: Rate Your Plate Scores:   Psychosocial: Target Goals: Acknowledge presence or absence of depression, maximize coping skills, provide positive support system. Participant is able to verbalize types and ability to use techniques and skills needed for reducing stress and depression.  Initial Review & Psychosocial Screening:   Quality of Life Scores:     Quality of Life - 07/18/15 1525    Quality of Life Scores   Health/Function Post 7.67 %   Health/Function % Change -2.75 %   Socioeconomic Post 17.64 %   Socioeconomic % Change 9.84 %   Psych/Spiritual Post 10.86 %   Psych/Spiritual % Change 7.8 %   Family Post 15.3 %   Family % Change -12.07 %   GLOBAL Post 11.4 %   GLOBAL % Change -2.75 %      PHQ-9:     Recent Review Flowsheet Data    Depression screen [facility] 2/9 07/18/2015 02/19/2015 02/12/2015 10/31/2014   Decreased Interest 1 1 1 1    Down, Depressed, Hopeless 1 1 1 1    PHQ - 2 Score 2 2 2 2    Altered sleeping 1 0 0 0   Tired, decreased  energy 2 2 2 3    Change in appetite 0 3 3 3    Feeling bad or failure about yourself  1 2 2 3    Trouble concentrating 1 1 1 1    Moving slowly or fidgety/restless 2 2 2 2    Suicidal thoughts 1 1 1 1    PHQ-9 Score 10 13 13 15    Difficult doing work/chores - Somewhat difficult - Somewhat difficult      Psychosocial Evaluation and Intervention:   Psychosocial Re-Evaluation:     Psychosocial Re-Evaluation      07/09/15 1250           Psychosocial Re-Evaluation   Comments Counselor follow up with Miguel. Palmer today reporting this program has been very helpful to him.  He states he has increased stamina and has improved the distance he can walk now since beginning the program.  He plans to maintain his progress by continued workout in the [employer] following discharge from this program.  He continues to have a positive outlook and states he appreciates the support he has recieved here by staff and classmates.           Education: Education Goals: Education classes will be provided on a weekly basis, covering required topics. Participant will state understanding/return demonstration of topics presented.  Learning Barriers/Preferences:   Education Topics: Initial Evaluation Education: - Verbal, written and demonstration of respiratory meds, RPE/PD scales, oximetry and breathing techniques. Instruction on use of nebulizers and MDIs: cleaning and proper use, rinsing mouth with steroid doses and importance of monitoring MDI activations.          Pulmonary Rehab from 07/18/2015 in [facility] Cardiac and Pulmonary Rehab   Date  10/31/14   Educator  LB   Instruction Review Code  2- meets goals/outcomes      General Nutrition Guidelines/Fats and Fiber: -Group instruction provided by verbal, written material, models and posters to present the general guidelines for heart healthy nutrition. Gives an explanation and review of dietary fats and fiber.      Pulmonary Rehab from 07/18/2015 in [facility]  Cardiac and Pulmonary Rehab   Date  02/05/15   Educator  C. Joneen Caraway, [place]   Instruction Review Code  2- meets goals/outcomes      Controlling Sodium/Reading Food Labels: -Group verbal and written material supporting the discussion of sodium use in heart healthy nutrition. Review and explanation with models, verbal and written materials for utilization of the food label.      Pulmonary Rehab from 07/18/2015 in [facility] Cardiac and Pulmonary Rehab   Date  02/12/15   Educator  C. Rusell, RD   Instruction Review Code  2- meets goals/outcomes      Exercise Physiology & Risk Factors: - Group verbal and written instruction with models to review the exercise physiology of the cardiovascular system and associated critical values. Details cardiovascular disease risk factors and the goals associated with each risk factor.   Aerobic Exercise & Resistance Training: - Gives group verbal and written discussion on the health impact of inactivity. On the components of aerobic and resistive training programs and the benefits of this training and how to safely progress through these programs.      Pulmonary Rehab from 07/18/2015 in [facility] Cardiac and Pulmonary Rehab   Date  07/18/15   Educator  SW   Instruction Review Code  2- meets goals/outcomes      Flexibility, Balance, General Exercise Guidelines: - Provides group verbal and written instruction on the benefits of flexibility and balance training programs. Provides general exercise guidelines with specific guidelines to those with heart or lung disease. Demonstration and skill practice provided.      Pulmonary Rehab from 07/18/2015 in [facility] Cardiac and Pulmonary Rehab   Date  01/24/15   Educator  SW   Instruction Review Code  2- meets goals/outcomes      Stress Management: - Provides group verbal and written instruction about the health risks of elevated stress, cause of high stress, and healthy ways to reduce stress.      Pulmonary Rehab from 07/18/2015  in [facility] Cardiac and Pulmonary Rehab   Date  01/17/15   Educator  [facility]   Instruction Review Code  2- meets goals/outcomes      Depression: - Provides group verbal and written instruction on the correlation between heart/lung disease and depressed mood, treatment options, and the stigmas associated with seeking treatment.      Pulmonary Rehab from 07/18/2015 in [facility] Cardiac and Pulmonary Rehab   Date  03/14/15   Educator  Berle Mull, MSW   Instruction Review Code  2- meets goals/outcomes      Exercise & Equipment Safety: - Individual verbal instruction and demonstration of equipment use and safety with use of the equipment.      Pulmonary Rehab from 07/18/2015 in [facility] Cardiac and Pulmonary Rehab   Date  01/15/15   Educator  L. Owens Shark, RRT [facility]]   Instruction Review Code  2- meets goals/outcomes      Infection Prevention: - Provides verbal and written material to individual with discussion of infection control including proper hand washing and proper equipment cleaning during exercise session.      Pulmonary Rehab from 07/18/2015 in [facility] Cardiac and Pulmonary Rehab   Date  11/03/14   Educator  Jacqulyn Ducking   Instruction Review Code  2- meets goals/outcomes      Falls Prevention: - Provides verbal and written material to individual with discussion of falls prevention and safety.   Diabetes: - Individual verbal and written instruction to review signs/symptoms of diabetes, desired ranges of glucose level fasting, after meals and with exercise. Advice that pre and post exercise glucose checks will be done for 3  sessions at entry of program.      Pulmonary Rehab from 07/18/2015 in [facility] Cardiac and Pulmonary Rehab   Date  02/23/15 [facility] your numbers]   Educator  C. Nuala Chiles   Instruction Review Code  2- meets goals/outcomes      Chronic Lung Diseases: - Group verbal and written instruction to review new updates, new respiratory medications, new advancements in procedures and  treatments. Provide informative websites and "800" numbers of self-education.      Pulmonary Rehab from 07/18/2015 in [facility] Cardiac and Pulmonary Rehab   Date  04/30/15   Educator  LB   Instruction Review Code  2- meets goals/outcomes      Lung Procedures: - Group verbal and written instruction to describe testing methods done to diagnose lung disease. Review the outcome of test results. Describe the treatment choices: Pulmonary Function Tests, ABGs and oximetry.      Pulmonary Rehab from 07/18/2015 in [facility] Cardiac and Pulmonary Rehab   Date  01/26/15   Educator  [place]   Instruction Review Code  2- meets goals/outcomes      Energy Conservation: - Provide group verbal and written instruction for methods to conserve energy, plan and organize activities. Instruct on pacing techniques, use of adaptive equipment and posture/positioning to relieve shortness of breath.      Pulmonary Rehab from 07/18/2015 in [facility] Cardiac and Pulmonary Rehab   Date  02/07/15   Educator  SW   Instruction Review Code  2- meets goals/outcomes      Triggers: - Group verbal and written instruction to review types of environmental controls: home humidity, furnaces, filters, dust mite/pet prevention, HEPA vacuums. To discuss weather changes, air quality and the benefits of nasal washing.      Pulmonary Rehab from 07/18/2015 in [facility] Cardiac and Pulmonary Rehab   Date  07/09/15   Educator  LB   Instruction Review Code  2- meets goals/outcomes      Exacerbations: - Group verbal and written instruction to provide: warning signs, infection symptoms, calling MD promptly, preventive modes, and value of vaccinations. Review: effective airway clearance, coughing and/or vibration techniques. Create an Sports administrator.   Oxygen: - Individual and group verbal and written instruction on oxygen therapy. Includes supplement oxygen, available portable oxygen systems, continuous and intermittent flow rates, oxygen safety, concentrators,  and Medicare reimbursement for oxygen.      Pulmonary Rehab from 07/18/2015 in [facility] Cardiac and Pulmonary Rehab   Date  10/31/14   Educator  LB   Instruction Review Code  2- meets goals/outcomes      Respiratory Medications: - Group verbal and written instruction to review medications for lung disease. Drug class, frequency, complications, importance of spacers, rinsing mouth after steroid MDI's, and proper cleaning methods for nebulizers.      Pulmonary Rehab from 07/18/2015 in [facility] Cardiac and Pulmonary Rehab   Date  10/31/14   Educator  LB   Instruction Review Code  2- meets goals/outcomes      AED/CPR: - Group verbal and written instruction with the use of models to demonstrate the basic use of the AED with the basic ABC's of resuscitation.      Pulmonary Rehab from 07/18/2015 in [facility] Cardiac and Pulmonary Rehab   Date  02/02/15   Educator  CE   Instruction Review Code  2- meets goals/outcomes      Breathing Retraining: - Provides individuals verbal and written instruction on purpose, frequency, and proper technique of diaphragmatic breathing and pursed-lipped breathing.  Applies individual practice skills.      Pulmonary Rehab from 07/18/2015 in [facility] Cardiac and Pulmonary Rehab   Date  10/31/14   Educator  LB   Instruction Review Code  R- Review/reinforce      Anatomy and Physiology of the Lungs: - Group verbal and written instruction with the use of models to provide basic lung anatomy and physiology related to function, structure and complications of lung disease.   Heart Failure: - Group verbal and written instruction on the basics of heart failure: signs/symptoms, treatments, explanation of ejection fraction, enlarged heart and cardiomyopathy.      Pulmonary Rehab from 07/18/2015 in [facility] Cardiac and Pulmonary Rehab   Date  01/12/15   Educator  Frederich Cha, RRT   Instruction Review Code  2- meets goals/outcomes      Sleep Apnea: - Individual verbal and written  instruction to review Obstructive Sleep Apnea. Review of risk factors, methods for diagnosing and types of masks and machines for OSA.   Anxiety: - Provides group, verbal and written instruction on the correlation between heart/lung disease and anxiety, treatment options, and management of anxiety.      Pulmonary Rehab from 07/18/2015 in [facility] Cardiac and Pulmonary Rehab   Date  02/14/15   Educator  Berle Mull, MSW   Instruction Review Code  2- Meets goals/outcomes      Relaxation: - Provides group, verbal and written instruction about the benefits of relaxation for patients with heart/lung disease. Also provides patients with examples of relaxation techniques.      Pulmonary Rehab from 07/18/2015 in [facility] Cardiac and Pulmonary Rehab   Date  05/23/15   Educator  Lucianne Lei LCSW   Instruction Review Code  2- Meets goals/outcomes      Knowledge Questionnaire Score:     Knowledge Questionnaire Score - 07/18/15 1130    Knowledge Questionnaire Score   Post Score 0      Personal Goals and Risk Factors at Admission:     Personal Goals and Risk Factors at Admission - 03/02/15 1130    Personal Goals and Risk Factors on Admission    Weight Management Yes      Personal Goals and Risk Factors Review:      Goals and Risk Factor Review      01/29/15 1550 03/19/15 1130 03/21/15 1130 03/26/15 1130 04/02/15 0853   Weight Management   Goals Progress/Improvement seen  No      Comments  Miguel Palmer has maintained his weight at 340-343. He continues with his [place] for his heart disease.      Increase Aerobic Exercise and Physical Activity   Goals Progress/Improvement seen      No   Comments     Has not seen much improvement, however, has been remaining stable which is key for him at the juncture.   Understand more about Heart/Pulmonary Disease   Goals Progress/Improvement seen   Yes Yes     Comments  Miguel Palmer brought in his PFT and CT results from recent testing. We discussed his  Fev1 and how it related to COPD. I plan to continue reviewing his other results. Miguel Palmer is very open for  new knowledge about lung disease. I continued to educate Miguel Palmer on his recent PFT . I explained the FEV1, RV, and DLCO. He is being referred to a pulmonologist which he is very positive about for answers about his lung condition.     Improve shortness of breath with ADL's  Goals Progress/Improvement seen     Yes    Comments    Miguel Palmer has good days and bad days with his breathing. He is adjusting his oxygen with exercise and pacing himself with activites. He is looking forward to consulting with a pulmonologist for more detail about his shortness of breath and breathing.    Breathing Techniques   Goals Progress/Improvement seen     Yes    Comments    Miguel Palmer uses PLB, especially on the TM and has maintined acceptable O2Sat with this exercise.    Increase knowledge of respiratory medications   Goals Progress/Improvement seen  Yes   Yes    Comments Discussed with Miguel Palmer about use an oral device with his sleep apnea, since he can not tolerate the CPAP masks, he was open to it and gave him a list of dentists in the area who are certiified in the oral devices.   Miguel Palmer is taking his Spiriva daily. So far, the "E" cylinders are his portal system for oxygen.    Diabetes   Goal    --  No problems with his diabete in [facility]    Hypertension   Goal    --  Maintaining acceptable BP at rest wnd with exercise in [facility].    Abnormal Lipids   Goal    --  No problems with cholesterol reading in [facility].    Stress   Goal    --  Miguel Palmer has met with the counselor in [place]. I have educated him on his  pulmonary test results and on portable concentrators.      04/04/15 1130 04/11/15 1603 04/18/15 1200 04/18/15 1244 04/18/15 1544   Weight Management   Goals Progress/Improvement seen   Yes --  Prediabetes on metformin. Instructed what HBA!c blood work means since [place] said no one  told him to check his blood sugars at home since they do blood work.     Comments   maintaining current weight well.     Increase Aerobic Exercise and Physical Activity   Goals Progress/Improvement seen   Yes Yes     Comments  Miguel Palmer walked on the TM at 2.0 for the entire 57mns and maintained acceptable O2Sats. maintaing current levels with acceptable responses     Understand more about Heart/Pulmonary Disease   Goals Progress/Improvement seen      Yes   Comments     Miguel Palmer an appointment with a pulmonologist on 05/24/2015. He is disappointed that the appointment is not sooner, but hopeful he will learn more about his lung disease.   Improve shortness of breath with ADL's   Goals Progress/Improvement seen    Yes     Comments   maintaining SOB symptoms.     Breathing Techniques   Goals Progress/Improvement seen    Yes     Comments   continuing to practice PLB with exertion     Increase knowledge of respiratory medications   Goals Progress/Improvement seen  Yes       Comments Miguel OAblehas been approved for a portable concentrator, and [facility] has order the oxygen system.       Diabetes   Goal     Blood glucose control identified by blood glucose values, HgbA1C. Participant verbalizes understanding of the signs/symptoms of hyper/hypo glycemia, proper foot care and importance of medication and nutrition plan for blood glucose control.   Hypertension   Goal   Participant will see blood  pressure controlled within the values of 140/67m/Hg or within value directed by their physician.  still receiving acceptable responses --  Blood pressure is stable today and usually.       04/24/15 0287611/15/16 0844 05/09/15 1130 05/21/15 1302 05/21/15 1538   Weight Management   Goals Progress/Improvement seen    Yes    Comments    Miguel Palmer "I walk by a brownie and gain weight". I told Miguel Palmer was impressed after Thanksgiving his weight was down a couple of lbs".     Increase Aerobic Exercise and  Physical Activity   Goals Progress/Improvement seen    Yes Yes    Comments   Miguel Palmer [id number] his mid 45m50m He is now walking on the TM 2.0 with improved O2Sat's on the 8l/m oxygen. He has progressed to L8 on the REL and L7 on the T5 - great progression. " I am able to do a little more and breath easier when I am walking".     Understand more about Heart/Pulmonary Disease   Goals Progress/Improvement seen   Yes  Yes    Comments  Miguel Palmer in his harmonica and instructed me on the breathing techniques for different sounds. He watched a video on the pulmonary harmonica and recommended the regular harmonica for  Chronic Lung patients.      Improve shortness of breath with ADL's   Goals Progress/Improvement seen      Yes   Comments     Miguel Palmer SOB Questionnaire improved by 7 points - Minimal difference is 5 points. Miguel Palmer more aware of his shortness of breath and how it correlates to O2Sat's. He adjusts his oxygen flow to 8l/m with his exercise goals.   Breathing Techniques   Goals Progress/Improvement seen      Yes   Comments     Miguel OlsPullaras good technique with his PLB and is very aware when to use this techniqe with his shortness of breath.   Increase knowledge of respiratory medications   Goals Progress/Improvement seen   Yes      Comments  Miguel OlsRibaudoquired a better O2 Concentrator with the increased intermittent flow to 5l/m which works better for him.  He is thinking about going to a musical jam session locally now that he has this portability with his oxygen.       Diabetes   Goal     --  Miguel OlsGleavess not had any problems with his glucose levels in [facility].   Hypertension   Goal    --  Blood pressure is 140/60 and is stable today.     Abnormal Lipids   Goal Cholesterol controlled with medications as prescribed, with individualized exercise RX and with personalized nutrition plan. Value goals: LDL < 48m745mDL > 40mg40mrticipant states understanding of desired  cholesterol values and following prescriptions.       Stress   Goal --  Miguel OlsonExleyuch more relaxed when he comes to class. He has accomplished many positive changes - more understanding of his lung disease, plus has an appointment with a pulmonologist, he has a better portable oxygen system order .            06/01/15 1516 06/04/15 1130 06/04/15 1236 06/11/15 1217 06/11/15 1624   Weight Management   Goals Progress/Improvement seen    No    Comments    Weight is staying steady, with no loss reported currently  Increase Aerobic Exercise and Physical Activity   Goals Progress/Improvement seen   Yes  Yes Yes   Comments  Miguel Carmen has no reached to point that he is looking to exercise more independently in  our [employer] when he graduates from [employer]. He is very proficent in setting up his oxygen and the exercise equipment.  Maintaining where he is at with exercise currently. Voices that he is ok with this trend currently I showed Miguel Florendo the [employer] [facility] gym. He was very interested and will exercise on the XR on Wednesday, so he can use the XR in the FF gym.   Understand more about Heart/Pulmonary Disease   Goals Progress/Improvement seen    Yes     Comments   Olsen is going for a second opinion with a Pulmonary MD since he wants to find out more details about an infection the other MD mentioned. Napolean is not running a fever but is "coughing up stuff all the time between "butterscotch color and dark chocolate". Hector doesn't think he has had a bronchoscopy lately.      Improve shortness of breath with ADL's   Goals Progress/Improvement seen     No    Comments    SOB seems to be staying consisitent, Not getting     Breathing Techniques   Goals Progress/Improvement seen  Yes       Comments Miguel. Irish Elders is using good technique with PLB.  He is having to use it more often due to increases in workload, but his O2 sats are WNL.       Increase knowledge of respiratory medications   Goals  Progress/Improvement seen      Yes   Comments     Miguel Tolliver is adjusting to his new concentrator, but he is aware that 5l/m intermittent does not maintain acceptable O2Sat's with exercise. His new pulmonologist did put him on a new inhaler and did want a sputum sample.   Hypertension   Goal    Participant will see blood pressure controlled within the values of 140/22m/Hg or within value directed by their physician.    Stress   Goal    To meet with psychosocial counselor for stress and relaxation information and guidance. To state understanding of performing relaxation techniques and or identifying personal stressors.      06/13/15 1539 06/19/15 1024 07/09/15 1130 07/23/15 1130     Weight Management   Goals Progress/Improvement seen   No     Comments   Miguel Daloia's weight has remained in the 338 to 343 range during [facility]. He continues with his Vegar diet. Miguel OCaseresdid meet with the dietitian. Through out the [facility], he has maintained a weight range of 334 to 342. [place] but he admits to o[employer]    Increase Aerobic Exercise and Physical Activity   Goals Progress/Improvement seen  Yes  Yes     Comments Set Miguel OFridayon the XR which will be a new exercise goal for him and can be carried over to his exercise goals in the FF.  Miguel ODupriesthas increased his exercise goals, exercises independently, and adjusts his oxygen to 6-8l/m with his exercise.     Understand more about Heart/Pulmonary Disease   Goals Progress/Improvement seen    Yes Yes    Comments   Miguel OZainohas an appointment with another pulmonologist for a second opinion. 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  His appointment is a [facility]. Miguel Fehnel has increased his knowledge in COPD and Sleep Apnea. He follows medical updates on line through the [facility]. On March 14th, he has an appointment with Dr Lurene Shadow , a pulmonologist , for a third opinion of his lung disease. Miguel  Vanhoesen is  very pro-active in learning more about his lung disease.     Improve shortness of breath with ADL's   Goals Progress/Improvement seen    Yes Yes    Comments   Miguel Virgin's shortness of breath has improved with activity. He adjusts his oxygen independently and paces himself with activity. He also uses his oximeter for home monitoring. Ms Riecke has a good understanding of his shortness of breath - he known his limits with his oxygen saturation, paces himself and adjusts his oxygen flows apropriately.    Breathing Techniques   Goals Progress/Improvement seen    Yes Yes    Comments   Good technique with PLB Good technique with PLB. Miguel Peerson has to remind himself to breath properly with activity.    Increase knowledge of respiratory medications   Goals Progress/Improvement seen    Yes Yes    Comments   Miguel Biela has adjusted to the new portable concentrator, although he knows the difference and benefit of continuous flow which he does not have with this concentrator. Miguel Kathan is aware that his new portable concentrator does not meet his needs for oxygen with exercise. This is a frequent problem with portable concentrators, and forces Miguel Kolodziejski to pace himself with activity at home. Fortunately he will continue to exercise at [facility] and have access to continuous flow at 8l/m.  He has a good understanding of his Spiriva and is compliant with it's use.    Diabetes   Goal  --  No issues with glucose levels in [facility]; Miguel Goethe is compliant with his medication. --  Miguel Blossom Hoops has no problems with his glucose levels in [facility] and is compliant with his medicine. --  No problems with glucose levels during exercise; conpliant with his medication    Hypertension   Goal   --  Miguel Patino has maintained acceptable BP readings and is compliant with his BP medicine. --  Acceptable BP readings in [facility]    Abnormal Lipids   Goal  --  No issues with Cholesterol; Miguel Bozzo is compliant with his medication.  --  No issues  with his cholesterol; compliant with medication       Personal Goals Discharge (Final Personal Goals and Risk Factors Review):      Goals and Risk Factor Review - 07/23/15 1130    Weight Management   Comments Miguel Whirley did meet with the dietitian. Through out the [facility], he has maintained a weight range of 334 to 342. He continues with the [place], but he admits to [employer].   Understand more about Heart/Pulmonary Disease   Goals Progress/Improvement seen  Yes   Comments Miguel Maalouf has increased his knowledge in COPD and Sleep Apnea. He follows medical updates on line through the [facility]. On March 14th, he has an appointment with Dr Lurene Shadow , a pulmonologist , for a third opinion of his lung disease. Miguel  Commons is very pro-active in learning more about his lung disease.    Improve shortness of breath with ADL's   Goals Progress/Improvement seen  Yes   Comments Ms Tiemann has a good understanding of his shortness of breath - he  known his limits with his oxygen saturation, paces himself and adjusts his oxygen flows apropriately.   Breathing Techniques   Goals Progress/Improvement seen  Yes   Comments Good technique with PLB. Miguel Brandenburg has to remind himself to breath properly with activity.   Increase knowledge of respiratory medications   Goals Progress/Improvement seen  Yes   Comments Miguel Salazar is aware that his new portable concentrator does not meet his needs for oxygen with exercise. This is a frequent problem with portable concentrators, and forces Miguel Gutridge to pace himself with activity at home. Fortunately he will continue to exercise at [facility] and have access to continuous flow at 8l/m.  He has a good understanding of his Spiriva and is compliant with it's use.   Diabetes   Goal --  No problems with glucose levels during exercise; conpliant with his medication   Hypertension   Goal --  Acceptable BP readings in [facility]   Abnormal Lipids   Goal --  No issues with his  cholesterol; compliant with medication      ITP Comments:     ITP Comments      06/13/15 1218 07/06/15 1240 07/06/15 1427 07/09/15 1257 07/23/15 1217   ITP Comments Personal and exercise goals expected to be met in 9  more sessions. Progress on specific individualized goals will be charted in patient's ITP. Upon completion of the program the patient will be comfortable managing exercise goals and progression on their own.  Expected to meet goals in the next 7 days. Expected to meet goals in the next 7 visits Personal and Exercsie goals expeted to be met in    more sessions. Progress on specific goals will be charted in the ITP.  Silvester Reierson should achieve his goals in 3 more sessions.      07/25/15 1249           ITP Comments Esperanza Sheets shoudl complete his goals including exercise goals by 1 more session.           Comments: Hermenegildo Clausen should complete his goals by one more Pulmonary Rehab sessions.

## 2015-07-25 NOTE — Progress Notes (Signed)
Daily Session Note  Patient Details  Name: Makoa Satz MRN: [id number] Date of Birth: [birth year] Referring Provider:  [name], MD  Encounter Date: 07/25/2015  Check In:     Session Check In - 07/25/15 1248    Check-In   Staff Present Carson Myrtle, BS, RRT, Respiratory Therapist;Brailey Buescher, RN, BSN;Steven Way, BS, ACSM EP-C, Exercise Physiologist;Renee Dillard Essex, MS, ACSM CEP, Exercise Physiologist   ER physicians immediately available to respond to emergencies [facility] immediately available ER MD   Physician(s) Dr. Kerman Passey and Dr. Jimmye Norman   Medication changes reported     No   Fall or balance concerns reported    No   Warm-up and Cool-down Performed on first and last piece of equipment   Pain Assessment   Currently in Pain? No/denies         Goals Met:  Proper associated with RPD/PD & O2 Sat Exercise tolerated well  Goals Unmet:  Not Applicable  Goals Comments:    Dr. Emily Filbert is Medical Director for [place] and [facility].

## 2015-07-30 ENCOUNTER — Encounter: Payer: Medicare Other | Admitting: *Deleted

## 2015-07-30 DIAGNOSIS — J449 Chronic obstructive pulmonary disease, unspecified: Secondary | ICD-10-CM

## 2015-07-30 DIAGNOSIS — G4730 Sleep apnea, unspecified: Secondary | ICD-10-CM

## 2015-07-30 DIAGNOSIS — J42 Unspecified chronic bronchitis: Secondary | ICD-10-CM | POA: Diagnosis not present

## 2015-07-30 NOTE — Progress Notes (Signed)
Pulmonary Individual Treatment Plan  Patient Details  Name: Miguel Palmer MRN: [id number] Date of Birth: [birth year] Referring Provider:  [name], MD  Initial Encounter Date: 10/31/2014  Visit Diagnosis: Sleep apnea  COPD, mild ([place])  Patient's Home Medications on Admission:  Current outpatient prescriptions:    aspirin 81 MG tablet, Take 81 mg by mouth every other day., Disp: , Rfl:    co-enzyme Q-10 30 MG capsule, Take 30 mg by mouth 2 (two) times daily., Disp: , Rfl:    dextroamphetamine (DEXEDRINE SPANSULE) 15 MG 24 hr capsule, Take 15 mg by mouth daily., Disp: , Rfl:    gabapentin (NEURONTIN) 400 MG capsule, Take by mouth., Disp: , Rfl:    Ginkgo Biloba 60 MG TABS, Take by mouth., Disp: , Rfl:    Glucosamine Sulfate 1000 MG CAPS, Take by mouth., Disp: , Rfl:    isosorbide mononitrate (IMDUR) 30 MG 24 hr tablet, TAKE 1 TABLET BY MOUTH EVERY DAY, Disp: , Rfl:    metFORMIN (GLUCOPHAGE) 500 MG tablet, TAKE 1 TABLET BY MOUTH DAILY., Disp: , Rfl:    metoprolol succinate (TOPROL-XL) 25 MG 24 hr tablet, TAKE 1 TABLET BY MOUTH EVERY DAY, Disp: , Rfl:    modafinil (PROVIGIL) 200 MG tablet, Take by mouth., Disp: , Rfl:    Multiple Vitamins-Minerals (MULTIVITAMIN & MINERAL PO), Take by mouth., Disp: , Rfl:    niacin (NIASPAN) 1000 MG CR tablet, Take by mouth., Disp: , Rfl:    nitroGLYCERIN (NITROSTAT) 0.4 MG SL tablet, Place under the tongue., Disp: , Rfl:    prasugrel (EFFIENT) 10 MG TABS tablet, TAKE 1 TABLET BY MOUTH EVERY DAY, Disp: , Rfl:    rosuvastatin (CRESTOR) 20 MG tablet, Take by mouth., Disp: , Rfl:    tiotropium (SPIRIVA) 18 MCG inhalation capsule, Place into inhaler and inhale., Disp: , Rfl:   Past Medical History: No past medical history on file.  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       ADL UCSD:     ADL UCSD      02/16/15 1130 02/16/15 1631 05/09/15 1130   ADL UCSD   ADL Phase Entry Exit Mid   SOB Score total 85 85 68   Rest 1 1 0   Walk 3 2 3    Stairs 4 4 4    Bath 3 3 4    Dress 3 3 3    Shop 5 5 4      07/18/15 1130       ADL UCSD   ADL Phase Exit     SOB Score total 70     Rest 0     Walk 3     Stairs 4     Bath 4     Dress 3     Shop 4         Pulmonary Function Assessment:   Exercise Target Goals:    Exercise Program Goal: Individual exercise prescription set with THRR, safety & activity barriers. Participant demonstrates ability to understand and report RPE using BORG scale, to self-measure pulse accurately, and to acknowledge the importance of the exercise prescription.  Exercise Prescription Goal: Starting with aerobic activity 30 plus minutes a day, 3 days per week for initial exercise prescription.  Provide home exercise prescription and guidelines that participant acknowledges understanding prior to discharge.  Activity Barriers & Risk Stratification:   6 Minute Walk:     6 Minute Walk      02/09/15 1159 02/09/15 1214 05/09/15 1157   6 Minute Walk   Phase Discharge  Mid Program   Distance 600 feet  915 feet   Walk Time 5 minutes  6 minutes   RPE 14  17   Perceived Dyspnea  4 (p) --  Post 6 min pulse ox 88% on 6 liters oxgyen 5   Symptoms   No   Resting HR 102 bpm (p) --  Pulse ox on 6 liters before 6 minute walk 98%. 88 bpm   Resting BP 158/88 mmHg  148/80 mmHg   Max Ex. HR 109 bpm  143 bpm   Max Ex. BP 172/94 mmHg  164/86 mmHg     07/25/15 1548       6 Minute Walk   Phase Discharge     Distance 800 feet     Walk Time 6 minutes     RPE 13     Perceived Dyspnea  4     Symptoms No     Resting HR 94 bpm     Resting BP 128/72 mmHg     Max Ex. HR 124 bpm     Max Ex. BP 138/74 mmHg        Initial Exercise Prescription:   Exercise Prescription  Changes:     Exercise Prescription Changes      02/12/15 1200 02/23/15 1500 03/27/15 0900 04/23/15 1100 05/09/15 1200   Exercise Review   Progression Yes  Yes Yes Yes   Response to Exercise   Blood Pressure (Admit)  134/83 mmHg 146/80 mmHg  Data 03/26/15 140/70 mmHg  Data 03/26/15    Blood Pressure (Exercise)  140/80 mmHg 150/70 mmHg 158/74 mmHg    Blood Pressure (Exit)  138/70 mmHg 148/80 mmHg 142/80 mmHg    Heart Rate (Admit)  101 bpm 84 bpm 84 bpm    Heart Rate (Exercise)  118 bpm 127 bpm 124 bpm    Heart Rate (Exit)  97 bpm 103 bpm 93 bpm    Oxygen Saturation (Admit)  91 % 97 % 92 %    Oxygen Saturation (Exercise)  89 % 88 %  8l/m C 93 %  8l/m C    Oxygen Saturation (Exit)  99 % 96 % 92 %    Rating of Perceived Exertion (Exercise)  13 13 13     Perceived Dyspnea (Exercise)  4 4 4     Symptoms    none None   Comments     Completed the mid program 6MW test and did very well. He improived over 300 feet from when he did his discharge walk in the program previously. These results were reviewed with him and he was very encouraged to hear this.    Frequency   Add 1 additional day to program exercise sessions. Add 1 additional day to program exercise sessions.    Duration   Progress to 50 minutes of aerobic without signs/symptoms of physical distress Progress to 50 minutes of aerobic without signs/symptoms of physical distress Progress to 50 minutes of aerobic without signs/symptoms of physical distress   Intensity   Rest + 30 Rest + 30 Rest + 30   Progression   Continue progressive overload as per policy without signs/symptoms or physical distress. Continue progressive overload as per policy without  signs/symptoms or physical distress. Continue progressive overload as per policy without signs/symptoms or physical distress.   Resistance Training   Training Prescription  Yes Yes Yes Yes   Weight  5 5 5 5    Reps  10-12 10-12 10-12 10-12   Interval Training   Interval Training     No    Treadmill   MPH  1.8 1.9 1.9 1.9   Grade  0 0 0 0   Minutes  15 15 15 15    NuStep   Level 7  T5 Nustep 7  T5 Nustep 7  T5 7  T5 7  T5   Watts 60 60 70 70 70   Minutes  15 15 15 15    Recumbant Elliptical   Level  6 7 7 7    RPM  50 60 60 60   Watts   15 15 15    Minutes  15      Home Exercise Plan   Plans to continue exercise at  --  Mr. Kapusta plans to attend another 36 sessions per MD order.        05/21/15 1621 06/11/15 1600 07/09/15 1200 07/23/15 1200 07/30/15 1500   Exercise Review   Progression Yes Yes Yes Yes Yes   Response to Exercise   Blood Pressure (Admit) 142/76 mmHg 124/60 mmHg 130/64 mmHg 130/64 mmHg 122/80 mmHg   Blood Pressure (Exercise) 154/70 mmHg 168/88 mmHg 178/80 mmHg 178/80 mmHg 130/78 mmHg   Blood Pressure (Exit) 148/80 mmHg  126/74 mmHg 126/74 mmHg 122/78 mmHg   Heart Rate (Admit) 106 bpm  103 bpm 103 bpm 105 bpm   Heart Rate (Exercise) 115 bpm 121 bpm 120 bpm 120 bpm 116 bpm   Heart Rate (Exit) 93 bpm 86 bpm 80 bpm 80 bpm 101 bpm   Oxygen Saturation (Admit) 95 % 91 % 94 % 94 % 90 %   Oxygen Saturation (Exercise) 95 % 89 % 94 % 94 % 90 %   Oxygen Saturation (Exit) 96 % 88 %  Room Air 97 %  Room Air 97 %  Room Air 97 %  Room Air   Rating of Perceived Exertion (Exercise) 13  13 13 12    Perceived Dyspnea (Exercise) 4  4 4 3    Symptoms None None none none none   Comments Completed the mid program 6MW test and did very well. He improived over 300 feet from when he did his discharge walk in the program previously. These results were reviewed with him and he was very encouraged to hear this.     Last session of [facility]; continue exercise in    Frequency  Add 2 additional days to program exercise sessions.      Duration Progress to 50 minutes of aerobic without signs/symptoms of physical distress Progress to 50 minutes of aerobic without signs/symptoms of physical distress Progress to 50 minutes of aerobic without signs/symptoms of physical distress Progress  to 50 minutes of aerobic without signs/symptoms of physical distress Progress to 50 minutes of aerobic without signs/symptoms of physical distress   Intensity Rest + 30 Rest + 30 THRR unchanged THRR unchanged Rest + 30   Progression Continue progressive overload as per policy without signs/symptoms or physical distress. Continue progressive overload as per policy without signs/symptoms or physical distress. Continue progressive overload as per policy without signs/symptoms or physical distress. Continue progressive overload as per policy without signs/symptoms or physical distress. Continue progressive overload as per policy without signs/symptoms or physical distress.  Resistance Training   Training Prescription Yes Yes   Yes   Weight 5 5 5 5 5    Reps 10-12 10-12 10-12 10-12 10-12   Interval Training   Interval Training No No No No    Oxygen   Oxygen     Continuous   Liters     8   Treadmill   MPH 1.9 2 2 2 2    Grade 0 0 0 0 0   Minutes 15 15 15 15 15    NuStep   Level 7  T5 7  T5 7  T5 7  T5 7  T5   Watts 70 70 70 70 70   Minutes 15 15 15 15 15    Recumbant Elliptical   Level 7 8 8 8 8    RPM 60 65 65 75 75   Watts 15 15 15 15 15    REL-XR   Level   10 10 10    Watts   100 100 100   Minutes   15 15 15    Home Exercise Plan   Plans to continue exercise at     [facility]      Discharge Exercise Prescription (Final Exercise Prescription Changes):     Exercise Prescription Changes - 07/30/15 1500    Exercise Review   Progression Yes   Response to Exercise   Blood Pressure (Admit) 122/80 mmHg   Blood Pressure (Exercise) 130/78 mmHg   Blood Pressure (Exit) 122/78 mmHg   Heart Rate (Admit) 105 bpm   Heart Rate (Exercise) 116 bpm   Heart Rate (Exit) 101 bpm   Oxygen Saturation (Admit) 90 %   Oxygen Saturation (Exercise) 90 %   Oxygen Saturation (Exit) 97 %  Room Air   Rating of Perceived Exertion (Exercise) 12   Perceived Dyspnea (Exercise) 3   Symptoms none    Comments Last session of [facility]; continue exercise in    Duration Progress to 50 minutes of aerobic without signs/symptoms of physical distress   Intensity Rest + 30   Progression Continue progressive overload as per policy without signs/symptoms or physical distress.   Resistance Training   Training Prescription Yes   Weight 5   Reps 10-12   Oxygen   Oxygen Continuous   Liters 8   Treadmill   MPH 2   Grade 0   Minutes 15   NuStep   Level 7  T5   Watts 70   Minutes 15   Recumbant Elliptical   Level 8   RPM 75   Watts 15   REL-XR   Level 10   Watts 100   Minutes 15   Home Exercise Plan   Plans to continue exercise at [facility]       Nutrition:  Target Goals: Understanding of nutrition guidelines, daily intake of sodium <1519m, cholesterol <2085m calories 30% from fat and 7% or less from saturated fats, daily to have 5 or more servings of fruits and vegetables.  Biometrics:      Post Biometrics - 07/25/15 1548     Post  Biometrics   Height 6' 2"  (1.88 m)   Waist Circumference 54 inches   Hip Circumference 54.5 inches   Waist to Hip Ratio 0.99 %      Nutrition Therapy Plan and Nutrition Goals:   Nutrition Discharge: Rate Your Plate Scores:   Psychosocial: Target Goals: Acknowledge presence or absence of depression, maximize coping skills, provide positive support system. Participant is able to verbalize  types and ability to use techniques and skills needed for reducing stress and depression.  Initial Review & Psychosocial Screening:   Quality of Life Scores:     Quality of Life - 07/18/15 1525    Quality of Life Scores   Health/Function Post 7.67 %   Health/Function % Change -2.75 %   Socioeconomic Post 17.64 %   Socioeconomic % Change 9.84 %   Psych/Spiritual Post 10.86 %   Psych/Spiritual % Change 7.8 %   Family Post 15.3 %   Family % Change -12.07 %   GLOBAL Post 11.4 %   GLOBAL % Change -2.75 %      PHQ-9:     Recent  Review Flowsheet Data    Depression screen [facility] 2/9 07/18/2015 02/19/2015 02/12/2015 10/31/2014   Decreased Interest 1 1 1 1    Down, Depressed, Hopeless 1 1 1 1    PHQ - 2 Score 2 2 2 2    Altered sleeping 1 0 0 0   Tired, decreased energy 2 2 2 3    Change in appetite 0 3 3 3    Feeling bad or failure about yourself  1 2 2 3    Trouble concentrating 1 1 1 1    Moving slowly or fidgety/restless 2 2 2 2    Suicidal thoughts 1 1 1 1    PHQ-9 Score 10 13 13 15    Difficult doing work/chores - Somewhat difficult - Somewhat difficult      Psychosocial Evaluation and Intervention:   Psychosocial Re-Evaluation:     Psychosocial Re-Evaluation      07/09/15 1250           Psychosocial Re-Evaluation   Comments Counselor follow up with Mr. Catlin today reporting this program has been very helpful to him.  He states he has increased stamina and has improved the distance he can walk now since beginning the program.  He plans to maintain his progress by continued workout in the [employer] following discharge from this program.  He continues to have a positive outlook and states he appreciates the support he has recieved here by staff and classmates.           Education: Education Goals: Education classes will be provided on a weekly basis, covering required topics. Participant will state understanding/return demonstration of topics presented.  Learning Barriers/Preferences:   Education Topics: Initial Evaluation Education: - Verbal, written and demonstration of respiratory meds, RPE/PD scales, oximetry and breathing techniques. Instruction on use of nebulizers and MDIs: cleaning and proper use, rinsing mouth with steroid doses and importance of monitoring MDI activations.          Pulmonary Rehab from 07/18/2015 in [facility] Cardiac and Pulmonary Rehab   Date  10/31/14   Educator  LB   Instruction Review Code  2- meets goals/outcomes      General Nutrition Guidelines/Fats and Fiber: -Group  instruction provided by verbal, written material, models and posters to present the general guidelines for heart healthy nutrition. Gives an explanation and review of dietary fats and fiber.      Pulmonary Rehab from 07/18/2015 in [facility] Cardiac and Pulmonary Rehab   Date  02/05/15   Educator  C. Joneen Caraway, RD   Instruction Review Code  2- meets goals/outcomes      Controlling Sodium/Reading Food Labels: -Group verbal and written material supporting the discussion of sodium use in heart healthy nutrition. Review and explanation with models, verbal and written materials for utilization of the food label.  Pulmonary Rehab from 07/18/2015 in [facility] Cardiac and Pulmonary Rehab   Date  02/12/15   Educator  C. Rusell, RD   Instruction Review Code  2- meets goals/outcomes      Exercise Physiology & Risk Factors: - Group verbal and written instruction with models to review the exercise physiology of the cardiovascular system and associated critical values. Details cardiovascular disease risk factors and the goals associated with each risk factor.   Aerobic Exercise & Resistance Training: - Gives group verbal and written discussion on the health impact of inactivity. On the components of aerobic and resistive training programs and the benefits of this training and how to safely progress through these programs.      Pulmonary Rehab from 07/18/2015 in [facility] Cardiac and Pulmonary Rehab   Date  07/18/15   Educator  SW   Instruction Review Code  2- meets goals/outcomes      Flexibility, Balance, General Exercise Guidelines: - Provides group verbal and written instruction on the benefits of flexibility and balance training programs. Provides general exercise guidelines with specific guidelines to those with heart or lung disease. Demonstration and skill practice provided.      Pulmonary Rehab from 07/18/2015 in [facility] Cardiac and Pulmonary Rehab   Date  01/24/15   Educator  SW   Instruction Review Code   2- meets goals/outcomes      Stress Management: - Provides group verbal and written instruction about the health risks of elevated stress, cause of high stress, and healthy ways to reduce stress.      Pulmonary Rehab from 07/18/2015 in [facility] Cardiac and Pulmonary Rehab   Date  01/17/15   Educator  [facility]   Instruction Review Code  2- meets goals/outcomes      Depression: - Provides group verbal and written instruction on the correlation between heart/lung disease and depressed mood, treatment options, and the stigmas associated with seeking treatment.      Pulmonary Rehab from 07/18/2015 in [facility] Cardiac and Pulmonary Rehab   Date  03/14/15   Educator  Berle Mull, MSW   Instruction Review Code  2- meets goals/outcomes      Exercise & Equipment Safety: - Individual verbal instruction and demonstration of equipment use and safety with use of the equipment.      Pulmonary Rehab from 07/18/2015 in [facility] Cardiac and Pulmonary Rehab   Date  01/15/15   Educator  L. Owens Shark, RRT [facility] [place]]   Instruction Review Code  2- meets goals/outcomes      Infection Prevention: - Provides verbal and written material to individual with discussion of infection control including proper hand washing and proper equipment cleaning during exercise session.      Pulmonary Rehab from 07/18/2015 in [facility] Cardiac and Pulmonary Rehab   Date  11/03/14   Educator  Jacqulyn Ducking   Instruction Review Code  2- meets goals/outcomes      Falls Prevention: - Provides verbal and written material to individual with discussion of falls prevention and safety.   Diabetes: - Individual verbal and written instruction to review signs/symptoms of diabetes, desired ranges of glucose level fasting, after meals and with exercise. Advice that pre and post exercise glucose checks will be done for 3 sessions at entry of program.      Pulmonary Rehab from 07/18/2015 in [facility] Cardiac and Pulmonary Rehab   Date  02/23/15 [facility] your numbers]    Educator  C. Enterkin   Instruction Review Code  2- meets goals/outcomes  Chronic Lung Diseases: - Group verbal and written instruction to review new updates, new respiratory medications, new advancements in procedures and treatments. Provide informative websites and "800" numbers of self-education.      Pulmonary Rehab from 07/18/2015 in [facility] Cardiac and Pulmonary Rehab   Date  04/30/15   Educator  LB   Instruction Review Code  2- meets goals/outcomes      Lung Procedures: - Group verbal and written instruction to describe testing methods done to diagnose lung disease. Review the outcome of test results. Describe the treatment choices: Pulmonary Function Tests, ABGs and oximetry.      Pulmonary Rehab from 07/18/2015 in [facility] Cardiac and Pulmonary Rehab   Date  01/26/15   Educator  [place]   Instruction Review Code  2- meets goals/outcomes      Energy Conservation: - Provide group verbal and written instruction for methods to conserve energy, plan and organize activities. Instruct on pacing techniques, use of adaptive equipment and posture/positioning to relieve shortness of breath.      Pulmonary Rehab from 07/18/2015 in [facility] Cardiac and Pulmonary Rehab   Date  02/07/15   Educator  SW   Instruction Review Code  2- meets goals/outcomes      Triggers: - Group verbal and written instruction to review types of environmental controls: home humidity, furnaces, filters, dust mite/pet prevention, HEPA vacuums. To discuss weather changes, air quality and the benefits of nasal washing.      Pulmonary Rehab from 07/18/2015 in [facility] Cardiac and Pulmonary Rehab   Date  07/09/15   Educator  LB   Instruction Review Code  2- meets goals/outcomes      Exacerbations: - Group verbal and written instruction to provide: warning signs, infection symptoms, calling MD promptly, preventive modes, and value of vaccinations. Review: effective airway clearance, coughing and/or vibration techniques. Create  an Sports administrator.   Oxygen: - Individual and group verbal and written instruction on oxygen therapy. Includes supplement oxygen, available portable oxygen systems, continuous and intermittent flow rates, oxygen safety, concentrators, and Medicare reimbursement for oxygen.      Pulmonary Rehab from 07/18/2015 in [facility] Cardiac and Pulmonary Rehab   Date  10/31/14   Educator  LB   Instruction Review Code  2- meets goals/outcomes      Respiratory Medications: - Group verbal and written instruction to review medications for lung disease. Drug class, frequency, complications, importance of spacers, rinsing mouth after steroid MDI's, and proper cleaning methods for nebulizers.      Pulmonary Rehab from 07/18/2015 in [facility] Cardiac and Pulmonary Rehab   Date  10/31/14   Educator  LB   Instruction Review Code  2- meets goals/outcomes      AED/CPR: - Group verbal and written instruction with the use of models to demonstrate the basic use of the AED with the basic ABC's of resuscitation.      Pulmonary Rehab from 07/18/2015 in [facility] Cardiac and Pulmonary Rehab   Date  02/02/15   Educator  CE   Instruction Review Code  2- meets goals/outcomes      Breathing Retraining: - Provides individuals verbal and written instruction on purpose, frequency, and proper technique of diaphragmatic breathing and pursed-lipped breathing. Applies individual practice skills.      Pulmonary Rehab from 07/18/2015 in [facility] Cardiac and Pulmonary Rehab   Date  10/31/14   Educator  LB   Instruction Review Code  R- Review/reinforce      Anatomy and Physiology of the Lungs: -  Group verbal and written instruction with the use of models to provide basic lung anatomy and physiology related to function, structure and complications of lung disease.   Heart Failure: - Group verbal and written instruction on the basics of heart failure: signs/symptoms, treatments, explanation of ejection fraction, enlarged heart and  cardiomyopathy.      Pulmonary Rehab from 07/18/2015 in [facility] Cardiac and Pulmonary Rehab   Date  01/12/15   Educator  Frederich Cha, RRT   Instruction Review Code  2- meets goals/outcomes      Sleep Apnea: - Individual verbal and written instruction to review Obstructive Sleep Apnea. Review of risk factors, methods for diagnosing and types of masks and machines for OSA.   Anxiety: - Provides group, verbal and written instruction on the correlation between heart/lung disease and anxiety, treatment options, and management of anxiety.      Pulmonary Rehab from 07/18/2015 in [facility] Cardiac and Pulmonary Rehab   Date  02/14/15   Educator  Berle Mull, MSW   Instruction Review Code  2- Meets goals/outcomes      Relaxation: - Provides group, verbal and written instruction about the benefits of relaxation for patients with heart/lung disease. Also provides patients with examples of relaxation techniques.      Pulmonary Rehab from 07/18/2015 in [facility] Cardiac and Pulmonary Rehab   Date  05/23/15   Educator  Lucianne Lei LCSW   Instruction Review Code  2- Meets goals/outcomes      Knowledge Questionnaire Score:     Knowledge Questionnaire Score - 07/18/15 1130    Knowledge Questionnaire Score   Post Score 0      Personal Goals and Risk Factors at Admission:     Personal Goals and Risk Factors at Admission - 03/02/15 1130    Personal Goals and Risk Factors on Admission    Weight Management Yes      Personal Goals and Risk Factors Review:      Goals and Risk Factor Review      03/19/15 1130 03/21/15 1130 03/26/15 1130 04/02/15 0853 04/04/15 1130   Weight Management   Goals Progress/Improvement seen No       Comments Mr Tibbs has maintained his weight at 340-343. He continues with his [place] for his heart disease.       Increase Aerobic Exercise and Physical Activity   Goals Progress/Improvement seen     No    Comments    Has not seen much improvement, however, has been  remaining stable which is key for him at the juncture.    Understand more about Heart/Pulmonary Disease   Goals Progress/Improvement seen  Yes Yes      Comments Mr Uttech brought in his PFT and CT results from recent testing. We discussed his Fev1 and how it related to COPD. I plan to continue reviewing his other results. Mr Puertas is very open for  new knowledge about lung disease. I continued to educate Mr Wengert on his recent PFT . I explained the FEV1, RV, and DLCO. He is being referred to a pulmonologist which he is very positive about for answers about his lung condition.      Improve shortness of breath with ADL's   Goals Progress/Improvement seen    Yes     Comments   Mr Baylock has good days and bad days with his breathing. He is adjusting his oxygen with exercise and pacing himself with activites. He is looking forward to consulting with a pulmonologist for  more detail about his shortness of breath and breathing.     Breathing Techniques   Goals Progress/Improvement seen    Yes     Comments   Mr Wortley uses PLB, especially on the TM and has maintined acceptable O2Sat with this exercise.     Increase knowledge of respiratory medications   Goals Progress/Improvement seen    Yes  Yes   Comments   Mr Wafer is taking his Spiriva daily. So far, the "E" cylinders are his portal system for oxygen.  Mr Strohmeier has been approved for a portable concentrator, and [facility] has order the oxygen system.   Diabetes   Goal   --  No problems with his diabete in [facility]     Hypertension   Goal   --  Maintaining acceptable BP at rest wnd with exercise in [facility].     Abnormal Lipids   Goal   --  No problems with cholesterol reading in [facility].     Stress   Goal   --  Mr Hausen has met with the counselor in [place]. I have educated him on his  pulmonary test results and on portable concentrators.       04/11/15 1603 04/18/15 1200 04/18/15 1244 04/18/15 1544 04/24/15 0727   Weight Management   Goals  Progress/Improvement seen  Yes --  Prediabetes on metformin. Instructed what HBA!c blood work means since Steele said no one told him to check his blood sugars at home since they do blood work.      Comments  maintaining current weight well.      Increase Aerobic Exercise and Physical Activity   Goals Progress/Improvement seen  Yes Yes      Comments Mr Luellen walked on the TM at 2.0 for the entire 89mns and maintained acceptable O2Sats. maintaing current levels with acceptable responses      Understand more about Heart/Pulmonary Disease   Goals Progress/Improvement seen     Yes    Comments    Mr OKautzmanhas an appointment with a pulmonologist on 05/24/2015. He is disappointed that the appointment is not sooner, but hopeful he will learn more about his lung disease.    Improve shortness of breath with ADL's   Goals Progress/Improvement seen   Yes      Comments  maintaining SOB symptoms.      Breathing Techniques   Goals Progress/Improvement seen   Yes      Comments  continuing to practice PLB with exertion      Diabetes   Goal    Blood glucose control identified by blood glucose values, HgbA1C. Participant verbalizes understanding of the signs/symptoms of hyper/hypo glycemia, proper foot care and importance of medication and nutrition plan for blood glucose control.    Hypertension   Goal  Participant will see blood pressure controlled within the values of 140/973mHg or within value directed by their physician.  still receiving acceptable responses --  Blood pressure is stable today and usually.      Abnormal Lipids   Goal     Cholesterol controlled with medications as prescribed, with individualized exercise RX and with personalized nutrition plan. Value goals: LDL < 7027mHDL > 60m82marticipant states understanding of desired cholesterol values and following prescriptions.   Stress   Goal     --  Mr OlsoDelphmuch more relaxed when he comes to class. He has accomplished many positive changes  - more understanding of his lung disease, plus has an appointment  with a pulmonologist, he has a better portable oxygen system order .        05/08/15 0844 05/09/15 1130 05/21/15 1302 05/21/15 1538 06/01/15 1516   Weight Management   Goals Progress/Improvement seen   Yes     Comments   Errol "I walk by a brownie and gain weight". I told Samie I was impressed after Thanksgiving his weight was down a couple of lbs".      Increase Aerobic Exercise and Physical Activity   Goals Progress/Improvement seen   Yes Yes     Comments  Mr Swaim improved 185f with his mid 637m. He is now walking on the TM 2.0 with improved O2Sat's on the 8l/m oxygen. He has progressed to L8 on the REL and L7 on the T5 - great progression. " I am able to do a little more and breath easier when I am walking".      Understand more about Heart/Pulmonary Disease   Goals Progress/Improvement seen  Yes  Yes     Comments Mr OlBurasrought in his harmonica and instructed me on the breathing techniques for different sounds. He watched a video on the pulmonary harmonica and recommended the regular harmonica for  Chronic Lung patients.       Improve shortness of breath with ADL's   Goals Progress/Improvement seen     Yes    Comments    Mr Locurto's SOB Questionnaire improved by 7 points - Minimal difference is 5 points. Mr OlRogackis more aware of his shortness of breath and how it correlates to O2Sat's. He adjusts his oxygen flow to 8l/m with his exercise goals.    Breathing Techniques   Goals Progress/Improvement seen     Yes Yes   Comments    Mr OlMcmahillas good technique with his PLB and is very aware when to use this techniqe with his shortness of breath. Mr. OlIrish Elderss using good technique with PLB.  He is having to use it more often due to increases in workload, but his O2 sats are WNL.   Increase knowledge of respiratory medications   Goals Progress/Improvement seen  Yes       Comments Mr OlWilnercquired a better O2 Concentrator with  the increased intermittent flow to 5l/m which works better for him.  He is thinking about going to a musical jam session locally now that he has this portability with his oxygen.        Diabetes   Goal    --  Mr OlRufoas not had any problems with his glucose levels in [facility].    Hypertension   Goal   --  Blood pressure is 140/60 and is stable today.        06/04/15 1130 06/04/15 1236 06/11/15 1217 06/11/15 1624 06/13/15 1539   Weight Management   Goals Progress/Improvement seen   No     Comments   Weight is staying steady, with no loss reported currently     Increase Aerobic Exercise and Physical Activity   Goals Progress/Improvement seen  Yes  Yes Yes Yes   Comments Mr OlThomasonas no reached to point that he is looking to exercise more independently in  our FoDillard'shen he graduates from [employer] is very proficent in setting up his oxygen and the exercise equipment.  Maintaining where he is at with exercise currently. Voices that he is ok with this trend currently I showed Mr OlComerhe [facility] gym. He was very  interested and will exercise on the XR on Wednesday, so he can use the XR in the [facility] gym. Set Mr Proffit on the XR which will be a new exercise goal for him and can be carried over to his exercise goals in the FF.   Understand more about Heart/Pulmonary Disease   Goals Progress/Improvement seen   Yes      Comments  Kirkland is going for a second opinion with a Pulmonary MD since he wants to find out more details about an infection the other MD mentioned. Ko is not running a fever but is "coughing up stuff all the time between "butterscotch color and dark chocolate". Jermarcus doesn't think he has had a bronchoscopy lately.       Improve shortness of breath with ADL's   Goals Progress/Improvement seen    No     Comments   SOB seems to be staying consisitent, Not getting      Increase knowledge of respiratory medications   Goals Progress/Improvement seen     Yes     Comments    Mr Broadwater is adjusting to his new concentrator, but he is aware that 5l/m intermittent does not maintain acceptable O2Sat's with exercise. His new pulmonologist did put him on a new inhaler and did want a sputum sample.    Hypertension   Goal   Participant will see blood pressure controlled within the values of 140/82m/Hg or within value directed by their physician.     Stress   Goal   To meet with psychosocial counselor for stress and relaxation information and guidance. To state understanding of performing relaxation techniques and or identifying personal stressors.       06/19/15 1024 07/09/15 1130 07/23/15 1130 07/25/15 1000     Weight Management   Goals Progress/Improvement seen  No      Comments  Mr Sestak's weight has remained in the 338 to 343 range during [facility]. He continues with his Vegar diet. Mr ORabinedid meet with the dietitian. Through out the [facility], he has maintained a weight range of 334 to 342. He continues with the VBronson but he admits to o[employer]     Increase Aerobic Exercise and Physical Activity   Goals Progress/Improvement seen   Yes  Yes    Comments  Mr OHeywardhas increased his exercise goals, exercises independently, and adjusts his oxygen to 6-8l/m with his exercise.  Mr OEidemhas made remarkable progress with his exercise goals - he has increased his levels from 3 to 7 on the T5, from L1 to L8 on the REL. He has confidence to change his oxygen to 8l/m with exercise, to check his oximetry with exercise ,and to adjust goals with lower O2Sats.  His 681m improved by [id number] is committed to exercising at the [facility] gym.    Understand more about Heart/Pulmonary Disease   Goals Progress/Improvement seen   Yes Yes     Comments  Mr OlsYohos an appointment with another pulmonologist for a second opinion. 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 His appointment is a [facility]. Mr OlsSchulenburgs increased his knowledge in COPD and Sleep  Apnea. He follows medical updates on line through the Pulmonary [facility]. On March 14th, he has an appointment with Dr HarLurene Shadowa pulmonologist , for a third opinion of his lung disease. Mr  OlsDemonte very pro-active in learning more about his lung disease.      Improve  shortness of breath with ADL's   Goals Progress/Improvement seen   Yes Yes     Comments  Mr Brester's shortness of breath has improved with activity. He adjusts his oxygen independently and paces himself with activity. He also uses his oximeter for home monitoring. Ms Clayburn has a good understanding of his shortness of breath - he known his limits with his oxygen saturation, paces himself and adjusts his oxygen flows apropriately.     Breathing Techniques   Goals Progress/Improvement seen   Yes Yes     Comments  Good technique with PLB Good technique with PLB. Mr Chiara has to remind himself to breath properly with activity.     Increase knowledge of respiratory medications   Goals Progress/Improvement seen   Yes Yes     Comments  Mr Oros has adjusted to the new portable concentrator, although he knows the difference and benefit of continuous flow which he does not have with this concentrator. Mr Alyea is aware that his new portable concentrator does not meet his needs for oxygen with exercise. This is a frequent problem with portable concentrators, and forces Mr Weissberg to pace himself with activity at home. Fortunately he will continue to exercise at [facility] and have access to continuous flow at 8l/m.  He has a good understanding of his Spiriva and is compliant with it's use.     Diabetes   Goal --  No issues with glucose levels in [facility]; Mr Fernandez is compliant with his medication. --  Mr Blossom Hoops has no problems with his glucose levels in [facility] and is compliant with his medicine. --  No problems with glucose levels during exercise; conpliant with his medication     Hypertension   Goal  --  Mr Kyllo has maintained acceptable BP  readings and is compliant with his BP medicine. --  Acceptable BP readings in [facility]     Abnormal Lipids   Goal --  No issues with Cholesterol; Mr Gadd is compliant with his medication.  --  No issues with his cholesterol; compliant with medication        Personal Goals Discharge (Final Personal Goals and Risk Factors Review):      Goals and Risk Factor Review - 07/25/15 1000    Increase Aerobic Exercise and Physical Activity   Goals Progress/Improvement seen  Yes   Comments Mr Crisafulli has made remarkable progress with his exercise goals - he has increased his levels from 3 to 7 on the T5, from L1 to L8 on the REL. He has confidence to change his oxygen to 8l/m with exercise, to check his oximetry with exercise ,and to adjust goals with lower O2Sats.  His 64md improved by 2056f He is committed to exercising at the [facility].      ITP Comments:     ITP Comments      06/13/15 1218 07/06/15 1240 07/06/15 1427 07/09/15 1257 07/23/15 1217   ITP Comments Personal and exercise goals expected to be met in 9  more sessions. Progress on specific individualized goals will be charted in patient's ITP. Upon completion of the program the patient will be comfortable managing exercise goals and progression on their own.  Expected to meet goals in the next 7 days. Expected to meet goals in the next 7 visits Personal and Exercsie goals expeted to be met in    more sessions. Progress on specific goals will be charted in the ITP.  RiFontaine Hehlhould achieve his goals in  3 more sessions.      07/25/15 1249 07/30/15 1206         ITP Comments Braydon Jeannine Kitten shoudl complete his goals including exercise goals by 1 more session.  Navraj Potts should complete his personal and exercise goals by today -his last Pulm Rehab session.         Comments: Mr Hetland has graduated from [employer] and will continue exercise in the [employer]. Thank you for the opportunity to work with your patient, Mr  Jensen Kilburg, in [place].

## 2015-07-30 NOTE — Progress Notes (Signed)
Daily Session Note  Patient Details  Name: Miguel Palmer MRN: [id number] Date of Birth: 1941/04/27 Referring Provider:  [name], MD  Encounter Date: 07/30/2015  Check In:     Session Check In - 07/30/15 1205    Check-In   Location [facility]   Staff Present Carson Myrtle, BS, RRT, Respiratory Therapist;Natosha Bou, RN, Drusilla Kanner, MS, ACSM CEP, Exercise Physiologist   Supervising physician immediately available to respond to emergencies [facility] immediately available ER MD   Physician(s) Dr. Clearnce Hasten and Dr. Kerman Passey   Medication changes reported     No   Fall or balance concerns reported    No   Warm-up and Cool-down Performed on first and last piece of equipment   Resistance Training Performed Yes   VAD Patient? No   Pain Assessment   Currently in Pain? No/denies         Goals Met:  Proper associated with RPD/PD & O2 Sat Exercise tolerated well  Goals Unmet:  Not Applicable  Goals Comments:    Dr. Emily Filbert is Medical Director for [place] and [facility].

## 2015-07-30 NOTE — Addendum Note (Signed)
Addended by: Karie Fetch on: 07/30/2015 04:04 PM   Modules accepted: Orders

## 2015-09-05 DIAGNOSIS — J8410 Pulmonary fibrosis, unspecified: Secondary | ICD-10-CM | POA: Insufficient documentation

## 2015-09-05 DIAGNOSIS — J9611 Chronic respiratory failure with hypoxia: Secondary | ICD-10-CM | POA: Insufficient documentation

## 2015-11-22 DIAGNOSIS — Z6841 Body Mass Index (BMI) 40.0 and over, adult: Secondary | ICD-10-CM

## 2015-12-27 DIAGNOSIS — R0902 Hypoxemia: Secondary | ICD-10-CM | POA: Insufficient documentation

## 2015-12-27 DIAGNOSIS — Z9981 Dependence on supplemental oxygen: Secondary | ICD-10-CM

## 2016-03-12 ENCOUNTER — Other Ambulatory Visit: Payer: Self-pay | Admitting: Physical Medicine and Rehabilitation

## 2016-03-12 DIAGNOSIS — M5416 Radiculopathy, lumbar region: Secondary | ICD-10-CM

## 2016-04-01 ENCOUNTER — Ambulatory Visit
Admission: RE | Admit: 2016-04-01 | Discharge: 2016-04-01 | Disposition: A | Discharge status: Home / Self Care | Payer: Medicare Other | Source: Ambulatory Visit | Attending: Physical Medicine and Rehabilitation | Admitting: Physical Medicine and Rehabilitation

## 2016-04-01 DIAGNOSIS — M5126 Other intervertebral disc displacement, lumbar region: Secondary | ICD-10-CM | POA: Diagnosis not present

## 2016-04-01 DIAGNOSIS — M1288 Other specific arthropathies, not elsewhere classified, other specified site: Secondary | ICD-10-CM | POA: Diagnosis not present

## 2016-04-01 DIAGNOSIS — M2578 Osteophyte, vertebrae: Secondary | ICD-10-CM | POA: Insufficient documentation

## 2016-04-01 DIAGNOSIS — M48061 Spinal stenosis, lumbar region without neurogenic claudication: Secondary | ICD-10-CM | POA: Diagnosis not present

## 2016-04-01 DIAGNOSIS — M5416 Radiculopathy, lumbar region: Secondary | ICD-10-CM | POA: Insufficient documentation

## 2016-05-01 ENCOUNTER — Ambulatory Visit: Payer: Medicare Other | Attending: Pain Medicine | Admitting: Pain Medicine

## 2016-05-01 ENCOUNTER — Encounter: Payer: Self-pay | Admitting: Pain Medicine

## 2016-05-01 VITALS — BP 120/66 | HR 75 | Resp 16 | Ht 74.0 in | Wt 345.0 lb

## 2016-05-01 DIAGNOSIS — Z6841 Body Mass Index (BMI) 40.0 and over, adult: Secondary | ICD-10-CM

## 2016-05-01 DIAGNOSIS — I214 Non-ST elevation (NSTEMI) myocardial infarction: Secondary | ICD-10-CM

## 2016-05-01 DIAGNOSIS — I252 Old myocardial infarction: Secondary | ICD-10-CM | POA: Insufficient documentation

## 2016-05-01 DIAGNOSIS — M2578 Osteophyte, vertebrae: Secondary | ICD-10-CM | POA: Diagnosis not present

## 2016-05-01 DIAGNOSIS — M48062 Spinal stenosis, lumbar region with neurogenic claudication: Secondary | ICD-10-CM

## 2016-05-01 DIAGNOSIS — G47429 Narcolepsy in conditions classified elsewhere without cataplexy: Secondary | ICD-10-CM

## 2016-05-01 DIAGNOSIS — Z9981 Dependence on supplemental oxygen: Secondary | ICD-10-CM | POA: Diagnosis not present

## 2016-05-01 DIAGNOSIS — R0902 Hypoxemia: Secondary | ICD-10-CM

## 2016-05-01 DIAGNOSIS — M4726 Other spondylosis with radiculopathy, lumbar region: Secondary | ICD-10-CM | POA: Diagnosis not present

## 2016-05-01 DIAGNOSIS — G8929 Other chronic pain: Secondary | ICD-10-CM

## 2016-05-01 DIAGNOSIS — Z955 Presence of coronary angioplasty implant and graft: Secondary | ICD-10-CM | POA: Insufficient documentation

## 2016-05-01 DIAGNOSIS — J449 Chronic obstructive pulmonary disease, unspecified: Secondary | ICD-10-CM | POA: Insufficient documentation

## 2016-05-01 DIAGNOSIS — J9611 Chronic respiratory failure with hypoxia: Secondary | ICD-10-CM

## 2016-05-01 DIAGNOSIS — E1140 Type 2 diabetes mellitus with diabetic neuropathy, unspecified: Secondary | ICD-10-CM | POA: Insufficient documentation

## 2016-05-01 DIAGNOSIS — E291 Testicular hypofunction: Secondary | ICD-10-CM

## 2016-05-01 DIAGNOSIS — F17210 Nicotine dependence, cigarettes, uncomplicated: Secondary | ICD-10-CM | POA: Diagnosis not present

## 2016-05-01 DIAGNOSIS — Z7984 Long term (current) use of oral hypoglycemic drugs: Secondary | ICD-10-CM | POA: Insufficient documentation

## 2016-05-01 DIAGNOSIS — N529 Male erectile dysfunction, unspecified: Secondary | ICD-10-CM | POA: Diagnosis not present

## 2016-05-01 DIAGNOSIS — E119 Type 2 diabetes mellitus without complications: Secondary | ICD-10-CM | POA: Insufficient documentation

## 2016-05-01 DIAGNOSIS — M48061 Spinal stenosis, lumbar region without neurogenic claudication: Secondary | ICD-10-CM | POA: Diagnosis not present

## 2016-05-01 DIAGNOSIS — G894 Chronic pain syndrome: Secondary | ICD-10-CM

## 2016-05-01 DIAGNOSIS — Z79899 Other long term (current) drug therapy: Secondary | ICD-10-CM | POA: Diagnosis not present

## 2016-05-01 DIAGNOSIS — Z7901 Long term (current) use of anticoagulants: Secondary | ICD-10-CM

## 2016-05-01 DIAGNOSIS — R29898 Other symptoms and signs involving the musculoskeletal system: Secondary | ICD-10-CM

## 2016-05-01 DIAGNOSIS — F329 Major depressive disorder, single episode, unspecified: Secondary | ICD-10-CM | POA: Diagnosis not present

## 2016-05-01 DIAGNOSIS — M4316 Spondylolisthesis, lumbar region: Secondary | ICD-10-CM

## 2016-05-01 DIAGNOSIS — M5441 Lumbago with sciatica, right side: Secondary | ICD-10-CM | POA: Diagnosis not present

## 2016-05-01 DIAGNOSIS — I2510 Atherosclerotic heart disease of native coronary artery without angina pectoris: Secondary | ICD-10-CM | POA: Diagnosis not present

## 2016-05-01 DIAGNOSIS — M79604 Pain in right leg: Secondary | ICD-10-CM

## 2016-05-01 DIAGNOSIS — M545 Low back pain: Secondary | ICD-10-CM | POA: Insufficient documentation

## 2016-05-01 DIAGNOSIS — M5116 Intervertebral disc disorders with radiculopathy, lumbar region: Secondary | ICD-10-CM | POA: Diagnosis not present

## 2016-05-01 DIAGNOSIS — J8410 Pulmonary fibrosis, unspecified: Secondary | ICD-10-CM

## 2016-05-01 NOTE — Progress Notes (Signed)
Safety precautions to be maintained throughout the outpatient stay will include: orient to surroundings, keep bed in low position, maintain call bell within reach at all times, provide assistance with transfer out of bed and ambulation.  

## 2016-05-01 NOTE — Progress Notes (Signed)
Patient's Name: Miguel Palmer  MRN: [id number]  Referring Provider: Sharlet Salina, MD  DOB: June 29, 1940  PCP: [name], MD  DOS: 05/01/2016  Note by: Kathlen Brunswick. Dossie Arbour, MD  Service setting: Ambulatory outpatient  Specialty: Interventional Pain Management  Location: [facility] (AMB) Pain Management Facility    Patient type: Fast-Track New Patient   Primary Reason(s) for Visit: Initial Patient Evaluation CC: Back Pain (lower)  HPI  Miguel Palmer is a 75 y.o. year old, male patient, who comes today for an initial evaluation. He has Allergic rhinitis; Arteriosclerosis of coronary artery; Abnormal gait; HLD (hyperlipidemia); Difficulty hearing; Heart attack; Gelineau syndrome; Neuropathy ([place]); Obstructive apnea; Male hypogonadism; Benign essential hypertension; CAD (coronary artery disease); Chronic obstructive pulmonary disease ([place]); Chronic respiratory failure with hypoxia ([place]); Diabetes mellitus without complication ([place]); Hypoxemia requiring supplemental oxygen; Lower extremity weakness; Mixed hyperlipidemia; Morbid obesity with BMI of 40.0-44.9, adult ([place]); Non-ST elevation myocardial infarction (NSTEMI), subendocardial infarction, subsequent episode of care [facility]); Prostate cancer screening; Pulmonary fibrosis, unspecified ([place]); Restrictive airway disease; Vitamin D deficiency; Chronic bilateral low back pain with right-sided sciatica; Long term current use of anticoagulant therapy; Chronic pain of right lower extremity; Spinal stenosis of lumbar region; Other spondylosis with radiculopathy, lumbar region; Spondylolisthesis of lumbar region; and Chronic pain syndrome on his problem list.. His primarily concern today is the Back Pain (lower)  Pain Assessment: Self-Reported Pain Score: 0-No pain/10            the patient indicates absolutely no pain when he is in the sitting position. Reported level is compatible with observation.       Pain Type: Chronic pain Pain Location: Back Pain  Orientation: Lower Pain Descriptors / Indicators: Constant, Dull, Penetrating, Sore, Pressure Pain Frequency: Constant  Onset and Duration: Gradual, Date of onset: 2014 and Present longer than 3 months Cause of pain: Unknown Severity: Getting worse, NAS-11 at its worse: 8/10, NAS-11 at its best: 0/10, NAS-11 now: 0/10 and NAS-11 on the average: 0/10 Timing: Not influenced by the time of the day Aggravating Factors: Prolonged standing, Walking, Walking uphill and Walking downhill Alleviating Factors: Acupuncture, Lying down, Nerve blocks, Resting and Sitting Associated Problems: Depression, Dizziness, Erectile dysfunction, Impotence, Inability to control bladder (urine), Numbness, Sadness, Spasms, Sweating, Swelling, Tingling and Weakness Quality of Pain: Aching, Agonizing, Burning, Intermittent, Deep, Disabling, Distressing, Dull, Exhausting, Getting longer, Nagging, Pressure-like, Sharp, Shooting, Tiring and Uncomfortable Previous Examinations or Tests: CT scan, MRI scan, Nerve block, X-rays and Neurological evaluation Previous Treatments: Physical Therapy, Relaxation therapy and Stretching exercises  The patient comes into the clinics today for the first time for a chronic pain management evaluation. The patient comes into the clinics today referred to [place] in consultation by Dr. Sharlet Salina. The patient is being sent as a "Fast Track" for pain management procedures. The patient's primary pain is in the lower back and right lower extremity. The patient has an MRI significant for a 2 mm anterolisthesis of L1 over L2; a 3 mm retrolisthesis of L3 over L4; a broad-based disc osteophyte complex eccentric towards the right, bilateral lateral recess stenosis, and mild bilateral facet arthropathy at L2-3; a broad-based disc osteophyte complex, bilateral mild facet arthropathy, bilateral lateral recess stenosis, moderate left and mild right foraminal narrowing, with mild spinal stenosis at L3-4; a broad-based disc  bulge, moderate bilateral facet arthropathy, mild bilateral foraminal narrowing, and left lateral recess stenosis at L4-5; a broad-based disc bulge, moderate bilateral facet arthropathy, and severe right and moderate left foraminal stenosis at L5-S1.  From the medical standpoint, he is on long-term anticoagulants, s/p 7 cardiac stents, has severe coronary artery disease with a history of myocardial infarctions, has oxygen-dependent COPD with emphysema, actively short of breath, with urinary incontinence, diabetes, and morbidly obese with an estimated body mass index of 44.3 kg/m.   Determination: Based on his medical conditions alone, this patient is not an adequate candidate for interventional therapies at this time. The risks of side effects and complications would not justify its benefits.  Disposition: Back to Dr. Sharlet Salina for long-term pain management with non-interventional options.  Historic Controlled Substance Pharmacotherapy Review  Fast-track patients are not evaluated or taken for medication management.  Meds  The patient has a current medication list which includes the following prescription(s): cinnamon, co-enzyme q-10, dextroamphetamine, fluzone high-dose, gabapentin, ginkgo biloba, glucosamine sulfate, guaifenesin, isosorbide mononitrate, metformin, metoprolol succinate, modafinil, multiple vitamins-minerals, niacin, nitroglycerin, prasugrel, rosuvastatin, and tiotropium.  Current Outpatient Prescriptions on File Prior to Visit  Medication Sig  . co-enzyme Q-10 30 MG capsule Take 200 mg by mouth 2 (two) times daily.   Marland Kitchen dextroamphetamine (DEXEDRINE SPANSULE) 15 MG 24 hr capsule Take 15 mg by mouth 4 (four) times daily.   . Ginkgo Biloba 60 MG TABS Take 60 mg by mouth as directed.   . Glucosamine Sulfate 1000 MG CAPS Take by mouth daily.   . isosorbide mononitrate (IMDUR) 30 MG 24 hr tablet TAKE 1 TABLET BY MOUTH EVERY DAY  . metFORMIN (GLUCOPHAGE) 500 MG tablet TAKE 1 TABLET BY  MOUTH DAILY.  . metoprolol succinate (TOPROL-XL) 25 MG 24 hr tablet TAKE 1 TABLET BY MOUTH EVERY DAY  . Multiple Vitamins-Minerals (MULTIVITAMIN & MINERAL PO) Take by mouth.  . niacin (NIASPAN) 1000 MG CR tablet Take 1,000 mg by mouth at bedtime.   . nitroGLYCERIN (NITROSTAT) 0.4 MG SL tablet Place under the tongue.  . prasugrel (EFFIENT) 10 MG TABS tablet TAKE 1 TABLET BY MOUTH EVERY DAY  . rosuvastatin (CRESTOR) 20 MG tablet Take 20 mg by mouth daily.   Marland Kitchen tiotropium (SPIRIVA) 18 MCG inhalation capsule Place into inhaler and inhale.   No current facility-administered medications on file prior to visit.    Imaging Review  Shoulder Imaging: Shoulder-R MR wo contrast:  Results for orders placed in visit on 05/11/14  MR Shoulder Right Wo Contrast   Narrative * PRIOR REPORT IMPORTED FROM AN EXTERNAL SYSTEM *   CLINICAL DATA:  Right shoulder pain. Limited range of motion.  Symptom duration: 2 months.   EXAM:  MRI OF THE RIGHT SHOULDER WITHOUT CONTRAST   TECHNIQUE:  Multiplanar, multisequence MR imaging of the shoulder was performed.  No intravenous contrast was administered.   COMPARISON:  None.   FINDINGS:  Rotator cuff: High-grade partial thickness articular surface tearing  of the subscapularis noted with prominent subscapularis and  supraspinatus tendinopathy, and mild infraspinatus tendinopathy.  Probable partial thickness articular surface tearing in the  supraspinatus tendon. Teres minor intact.   Muscles:  Intact   Biceps long head: Abnormally medially dislocated in the joint, with  high-grade partial tearing and irregularity of the intra-articular  segment. Synovitis or debris adjacent to the tendon in the expanded  biceps recess.   Acromioclavicular Joint: Mild degenerative AC joint arthropathy and  abnormal widening of the AC joint at 9 mm. Edema signal within this  widened AC joint.   Glenohumeral Joint: Shoulder joint effusion. Moderate degenerative   glenohumeral chondral thinning. Glenoid spurring.   Labrum:  Grossly intact   Bones: Red marrow  redistribution is present. This is nonspecific can  be associated with a variety of conditions including chronic anemia,  chronic heart failure, myelofibrosis, infiltrative marrow disease,  response to infection, and other conditions.   IMPRESSION:  1. High-grade partial thickness articular surface sub scapularis  tearing with prominent subscapularis and supraspinatus tendinopathy.  Mild infraspinatus tendinopathy.  2. High-grade partial tearing of the long head of the biceps, which  is displaced medially in the joint. Probable synovitis adjacent to  the biceps tendon in the biceps recess.  3. Mildly widened AC joint with internal edema -chronicity of the [facility]  joint widening is uncertain.  4. Considerable shoulder joint effusion. Moderate degenerative  glenohumeral chondral thinning with glenoid spurring.  5. Red marrow redistribution is present. This is nonspecific can be  associated with a variety of conditions including chronic anemia,  chronic heart failure, myelofibrosis, infiltrative marrow disease,  response to infection, and other conditions.    Electronically Signed    By: Sherryl Barters M.D.    On: 05/11/2014 11:17       Lumbosacral Imaging: Lumbar MR wo contrast:  Results for orders placed during the hospital encounter of 04/01/16  MR Lumbar Spine Wo Contrast   Narrative CLINICAL DATA:  Low back pain, no known injury, bilateral foot numbness  EXAM: MRI LUMBAR SPINE WITHOUT CONTRAST  TECHNIQUE: Multiplanar, multisequence MR imaging of the lumbar spine was performed. No intravenous contrast was administered.  COMPARISON:  None.  FINDINGS: Segmentation:  Standard.  Alignment: 2 mm anterolisthesis of L1 on L2. 3 mm retrolisthesis of L3 on L4.  Vertebrae:  No fracture, evidence of discitis, or bone lesion.  Conus medullaris: Extends to the L1 level and  appears normal.  Paraspinal and other soft tissues: Negative.  Disc levels:  Disc spaces: Degenerative disc disease with disc height loss throughout the lumbar spine.  T12-L1: No significant disc bulge. No evidence of neural foraminal stenosis. No central canal stenosis.  L1-L2: Broad-based disc bulge. Mild bilateral facet arthropathy. Right lateral recess stenosis. No evidence of neural foraminal stenosis. No central canal stenosis.  L2-L3: Broad-based disc osteophyte complex eccentric towards the right. Bilateral lateral recess stenosis. Mild bilateral facet arthropathy. No evidence of neural foraminal stenosis. No central canal stenosis.  L3-L4: Broad-based disc osteophyte complex. Bilateral mild facet arthropathy. Bilateral lateral recess stenosis. Moderate left and mild right foraminal narrowing. Mild spinal stenosis.  L4-L5: Broad-based disc bulge. Moderate bilateral facet arthropathy. Mild bilateral foraminal narrowing. Left lateral recess stenosis. No central canal stenosis.  L5-S1: Broad-based disc bulge. Moderate bilateral facet arthropathy. Severe right and moderate left foraminal stenosis. No central canal stenosis.  IMPRESSION: 1. At L2-3 there is a broad-based disc osteophyte complex eccentric towards the right. Bilateral lateral recess stenosis. Mild bilateral facet arthropathy. 2. At L3-4 there is a broad-based disc osteophyte complex. Bilateral mild facet arthropathy. Bilateral lateral recess stenosis. Moderate left and mild right foraminal narrowing. Mild spinal stenosis. 3. At L4-5 there is a broad-based disc bulge. Moderate bilateral facet arthropathy. Mild bilateral foraminal narrowing. Left lateral recess stenosis. 4. At L5-S1 there is a broad-based disc bulge. Moderate bilateral facet arthropathy. Severe right and moderate left foraminal stenosis.   Electronically Signed   By: Kathreen Devoid   On: 04/01/2016 11:10    Note: Imaging results  reviewed.  ROS  Cardiovascular History: Heart trouble, Heart attack ( Date: 2006) and Blood thinners:  Antiplatelet Pulmonary or Respiratory History: Lung problems, Emphysema, Shortness of breath, Snoring  and Bronchitis Neurological History: Spina  bifida, Scoliosis and Incontinence:  Urinary Review of Past Neurological Studies: No results found for this or any previous visit. Psychological-Psychiatric History: Anxiety Gastrointestinal History: Negative for peptic ulcer disease, hiatal hernia, GERD, IBS, hepatitis, cirrhosis or pancreatitis Genitourinary History: Nephrolithiasis and Hematuria Hematological History: Brusing easily, Bleeding easily and Positive for taking the following blood thinner: Effient Endocrine History: Non-insulin-dependent diabetes mellitus Rheumatologic History: Chronic Fatigue Syndrome Musculoskeletal History: Negative for myasthenia gravis, muscular dystrophy, multiple sclerosis or malignant hyperthermia Work History: Disabled and Quit going to work on his/her own  Allergies  Mr. Andringa is allergic to pollen extract.  Laboratory Chemistry  Inflammation Markers No results found for: ESRSEDRATE, CRP Renal Function No results found for: BUN, CREATININE, GFRAA, GFRNONAA Hepatic Function No results found for: AST, ALT, ALBUMIN Electrolytes No results found for: NA, K, CL, CALCIUM, MG Pain Modulating Vitamins No results found for: [place], [id number], [id number], [id number], 25OHVITD1, 25OHVITD2, 25OHVITD3, VITAMINB12 Coagulation Parameters No results found for: INR, LABPROT, APTT, PLT Cardiovascular No results found for: BNP, HGB, HCT Note: No results found under the [employer] electronic medical record  [facility]  Drug: Mr. Kasler  has no drug history on file. Alcohol:  has no alcohol history on file. Tobacco:  reports that he has quit smoking. His smoking use included Cigarettes. He has a 44.00 pack-year smoking history. He quit smokeless tobacco use about  35 years ago. 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 Family: family history includes Alzheimer's disease in his father; Asthma in his father; Dementia in his mother; Emphysema in his father.  Past Surgical History:  Procedure Laterality Date  . CORONARY STENT PLACEMENT    . KNEE ARTHROSCOPY Right    Active Ambulatory Problems    Diagnosis Date Noted  . Allergic rhinitis 06/25/2011  . Arteriosclerosis of coronary artery 09/04/2011  . Abnormal gait 10/09/2013  . HLD (hyperlipidemia) 10/31/2014  . Difficulty hearing 03/17/2014  . Heart attack 10/31/2014  . 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 Neuropathy ([place]) 01/17/2011  . Obstructive apnea 08/30/2012  . 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 Benign essential hypertension 01/04/2015  . CAD (coronary artery disease) 09/04/2011  . Chronic obstructive pulmonary disease ([place]) 04/01/2015  . Chronic respiratory failure with hypoxia ([place]) 09/05/2015  . Diabetes mellitus without complication ([place]) 28/76/8115  . Hypoxemia requiring supplemental oxygen 12/27/2015  . Lower extremity weakness 03/30/2011  . Mixed hyperlipidemia 01/05/2015  . Morbid obesity with BMI of 40.0-44.9, adult ([place]) 11/22/2015  . Non-ST elevation myocardial infarction (NSTEMI), subendocardial infarction, subsequent episode of care ([place]) 01/05/2015  . Prostate cancer screening 09/23/2011  . Pulmonary fibrosis, unspecified ([place]) 09/05/2015  . Restrictive airway disease 06/20/2015  . Vitamin D deficiency 09/23/2011  . Chronic bilateral low back pain with right-sided sciatica 05/01/2016  . Long term current use of anticoagulant therapy 05/01/2016  . Chronic pain of right lower extremity 05/01/2016  . Spinal  stenosis of lumbar region 05/01/2016  . Other spondylosis with radiculopathy, lumbar region 05/01/2016  . Spondylolisthesis of lumbar region 05/01/2016  . Chronic pain syndrome 05/01/2016   Resolved Ambulatory Problems    Diagnosis Date Noted  . Biceps tendinitis 05/29/2014  . Frank hematuria 04/28/2013  . Disorder of rotator cuff 05/29/2014  . Memory change 01/13/2014   Past Medical History:  Diagnosis Date  . Arteriosclerosis   . Biceps tendinitis 05/29/2014  .  COPD (chronic obstructive pulmonary disease) ([place])   . Disorder of rotator cuff 05/29/2014  . Edema   . Frank hematuria 04/28/2013  . Memory change 01/13/2014  . Obesity   . 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 Oxygen deficiency    Constitutional Exam  General appearance: alert, cooperative, oriented, in no distress, morbidly obese, well nourished and well hydrated Vitals:   05/01/16 1134  BP: 120/66  Pulse: 75  Resp: 16  SpO2: 95%  Weight: (!) 345 lb (156.5 kg)  Height: 6' 2"  (1.88 m)   BMI Assessment: Estimated body mass index is 44.3 kg/m as calculated from the following:   Height as of this encounter: 6' 2"  (1.88 m).   Weight as of this encounter: 345 lb (156.5 kg).  BMI interpretation table: BMI level Category Range association with higher incidence of chronic pain  <18 kg/m2 Underweight   18.5-24.9 kg/m2 Ideal body weight   25-29.9 kg/m2 Overweight Increased incidence by 20%  30-34.9 kg/m2 Obese (Class I) Increased incidence by 68%  35-39.9 kg/m2 Severe obesity (Class II) Increased incidence by 136%  >40 kg/m2 Extreme obesity (Class III) Increased incidence by 254%   BMI Readings from Last 4 Encounters:  05/01/16 44.30 kg/m  03/05/15 44.15 kg/m  10/31/14 43.99 kg/m   Wt Readings from Last 4 Encounters:  05/01/16 (!) 345 lb (156.5 kg)  03/05/15 (!) 343 lb 14.4 oz (156 kg)  10/31/14 (!) 342 lb 9.6 oz (155.4 kg)  Psych/Mental status: Alert, oriented x 3 (person, place, & time) Eyes:  PERLA Respiratory: Oxygen-dependent COPD. Comes in today on supplemental oxygen.  Cervical Spine Exam  Inspection: No masses, redness, or swelling Alignment: Symmetrical Functional ROM: Unrestricted ROM Stability: No instability detected Muscle strength & Tone: Functionally intact Sensory: Unimpaired Palpation: Non-contributory  Upper Extremity (UE) Exam    Side: Right upper extremity  Side: Left upper extremity  Inspection: No masses, redness, swelling, or asymmetry  Inspection: No masses, redness, swelling, or asymmetry  Functional ROM: Unrestricted ROM         Functional ROM: Unrestricted ROM          Muscle strength & Tone: Functionally intact  Muscle strength & Tone: Functionally intact  Sensory: Unimpaired  Sensory: Unimpaired  Palpation: Non-contributory  Palpation: Non-contributory   Thoracic Spine Exam  Inspection: No masses, redness, or swelling Alignment: Symmetrical Functional ROM: Unrestricted ROM Stability: No instability detected Sensory: Unimpaired Muscle strength & Tone: Functionally intact Palpation: Non-contributory  Lumbar Spine Exam  Inspection: No masses, redness, or swelling Alignment: Symmetrical Functional ROM: Unrestricted ROM Stability: No instability detected Muscle strength & Tone: Functionally intact Sensory: Unimpaired Palpation: Non-contributory Provocative Tests: Lumbar Hyperextension and rotation test: evaluation deferred today       Patrick's Maneuver: evaluation deferred today              Gait & Posture Assessment  Ambulation: Patient ambulates using a cane Gait: Modified gait pattern (slower gait speed, wider stride width, and longer stance duration) associated with morbid obesity Posture: Poor   Lower Extremity Exam    Side: Right lower extremity  Side: Left lower extremity  Inspection: No masses, redness, swelling, or asymmetry  Inspection: No masses, redness, swelling, or asymmetry  Functional ROM: Unrestricted ROM           Functional ROM: Unrestricted ROM          Muscle strength & Tone: Functionally intact  Muscle strength & Tone: Functionally intact  Sensory: Unimpaired  Sensory: Unimpaired  Palpation: Non-contributory  Palpation: Non-contributory   Assessment  Primary Diagnosis & Pertinent Problem List: The primary encounter diagnosis was Chronic bilateral low back pain with right-sided sciatica. Diagnoses of Chronic pain of right lower extremity, Spinal stenosis of lumbar region with neurogenic claudication, Weakness of right lower extremity, Other spondylosis with radiculopathy, lumbar region, Spondylolisthesis of lumbar region, Chronic pain syndrome, Morbid obesity with BMI of 40.0-44.9, adult ([place]), Long term current use of anticoagulant therapy, Coronary artery disease involving native heart, angina presence unspecified, unspecified vessel or lesion type, Non-ST elevation myocardial infarction (NSTEMI), subendocardial infarction, subsequent episode of care [facility]), Chronic respiratory failure with hypoxia ([place]), Hypoxemia requiring supplemental oxygen, Pulmonary fibrosis, unspecified ([place]), Male hypogonadism, and Narcolepsy due to underlying condition without cataplexy were also pertinent to this visit.  Visit Diagnosis: 1. Chronic bilateral low back pain with right-sided sciatica   2. Chronic pain of right lower extremity   3. Spinal stenosis of lumbar region with neurogenic claudication   4. Weakness of right lower extremity   5. Other spondylosis with radiculopathy, lumbar region   6. Spondylolisthesis of lumbar region   7. Chronic pain syndrome   8. Morbid obesity with BMI of 40.0-44.9, adult ([place])   9. Long term current use of anticoagulant therapy   10. Coronary artery disease involving native heart, angina presence unspecified, unspecified vessel or lesion type   11. Non-ST elevation myocardial infarction (NSTEMI), subendocardial infarction, subsequent episode of care ([place])   12. Chronic respiratory  failure with hypoxia ([facility])   13. Hypoxemia requiring supplemental oxygen   14. Pulmonary fibrosis, unspecified ([place])   15. Male hypogonadism   16. Narcolepsy due to underlying condition without cataplexy    Plan of Care  Back to Dr. Sharlet Salina for chronic pain management with noninterventional techniques.  Pharmacotherapy: None    Pharmacotherapy under consideration:  The patient was referred as a "Fast Track", therefore we will provide no long-term medication management.    Interventional therapies under consideration: Based on his medical conditions alone, this patient is not an adequate candidate for interventional therapies at this time. The risks of side effects and complications would not justify its benefits.   Provider-requested follow-up: Return if symptoms worsen or fail to improve, for Not a candidate for procedures..  No future appointments.  Primary Care Physician: [name], MD Location: [facility] Outpatient Pain Management Facility Note by: Kathlen Brunswick. Dossie Arbour, M.D, DABA, DABAPM, DABPM, DABIPP, FIPP  Pain Score Disclaimer: We use the NRS-11 scale. This is a self-reported, subjective measurement of pain severity with only modest accuracy. It is used primarily to identify changes within a particular patient. It must be understood that outpatient pain scales are significantly less accurate that those used for research, where they can be applied under ideal controlled circumstances with minimal exposure to variables. In reality, the score is likely to be a combination of pain intensity and pain affect, where pain affect describes the degree of emotional arousal or changes in action readiness caused by the sensory experience of pain. Factors such as social and work situation, setting, emotional state, anxiety levels, expectation, and prior pain experience may influence pain perception and show large inter-individual differences that may also be affected by time variables.  Patient  instructions provided during this appointment: There are no Patient Instructions on file for this visit.

## 2016-05-06 ENCOUNTER — Other Ambulatory Visit: Payer: Self-pay

## 2016-05-17 ENCOUNTER — Emergency Department: Payer: Medicare Other

## 2016-05-17 ENCOUNTER — Encounter: Payer: Self-pay | Admitting: Emergency Medicine

## 2016-05-17 ENCOUNTER — Observation Stay
Admission: EM | Admit: 2016-05-17 | Discharge: 2016-05-19 | Disposition: A | Discharge status: Home Health | Payer: Medicare Other | Attending: Internal Medicine | Admitting: Internal Medicine

## 2016-05-17 DIAGNOSIS — Z79899 Other long term (current) drug therapy: Secondary | ICD-10-CM | POA: Insufficient documentation

## 2016-05-17 DIAGNOSIS — J9621 Acute and chronic respiratory failure with hypoxia: Secondary | ICD-10-CM | POA: Diagnosis not present

## 2016-05-17 DIAGNOSIS — I10 Essential (primary) hypertension: Secondary | ICD-10-CM | POA: Diagnosis not present

## 2016-05-17 DIAGNOSIS — E1140 Type 2 diabetes mellitus with diabetic neuropathy, unspecified: Secondary | ICD-10-CM | POA: Diagnosis not present

## 2016-05-17 DIAGNOSIS — G894 Chronic pain syndrome: Secondary | ICD-10-CM | POA: Diagnosis not present

## 2016-05-17 DIAGNOSIS — I259 Chronic ischemic heart disease, unspecified: Secondary | ICD-10-CM

## 2016-05-17 DIAGNOSIS — E782 Mixed hyperlipidemia: Secondary | ICD-10-CM | POA: Insufficient documentation

## 2016-05-17 DIAGNOSIS — Z955 Presence of coronary angioplasty implant and graft: Secondary | ICD-10-CM | POA: Insufficient documentation

## 2016-05-17 DIAGNOSIS — Z87891 Personal history of nicotine dependence: Secondary | ICD-10-CM | POA: Diagnosis not present

## 2016-05-17 DIAGNOSIS — R262 Difficulty in walking, not elsewhere classified: Secondary | ICD-10-CM

## 2016-05-17 DIAGNOSIS — J189 Pneumonia, unspecified organism: Secondary | ICD-10-CM | POA: Diagnosis not present

## 2016-05-17 DIAGNOSIS — Z7984 Long term (current) use of oral hypoglycemic drugs: Secondary | ICD-10-CM | POA: Insufficient documentation

## 2016-05-17 DIAGNOSIS — R079 Chest pain, unspecified: Secondary | ICD-10-CM | POA: Diagnosis present

## 2016-05-17 DIAGNOSIS — Z9981 Dependence on supplemental oxygen: Secondary | ICD-10-CM | POA: Insufficient documentation

## 2016-05-17 DIAGNOSIS — R531 Weakness: Secondary | ICD-10-CM | POA: Diagnosis not present

## 2016-05-17 DIAGNOSIS — F909 Attention-deficit hyperactivity disorder, unspecified type: Secondary | ICD-10-CM | POA: Insufficient documentation

## 2016-05-17 DIAGNOSIS — Z7901 Long term (current) use of anticoagulants: Secondary | ICD-10-CM | POA: Diagnosis not present

## 2016-05-17 DIAGNOSIS — J440 Chronic obstructive pulmonary disease with acute lower respiratory infection: Secondary | ICD-10-CM | POA: Insufficient documentation

## 2016-05-17 DIAGNOSIS — G47419 Narcolepsy without cataplexy: Secondary | ICD-10-CM | POA: Insufficient documentation

## 2016-05-17 DIAGNOSIS — I25119 Atherosclerotic heart disease of native coronary artery with unspecified angina pectoris: Secondary | ICD-10-CM | POA: Insufficient documentation

## 2016-05-17 DIAGNOSIS — J42 Unspecified chronic bronchitis: Secondary | ICD-10-CM

## 2016-05-17 DIAGNOSIS — Z7982 Long term (current) use of aspirin: Secondary | ICD-10-CM | POA: Insufficient documentation

## 2016-05-17 DIAGNOSIS — Z6841 Body Mass Index (BMI) 40.0 and over, adult: Secondary | ICD-10-CM | POA: Diagnosis not present

## 2016-05-17 DIAGNOSIS — M6281 Muscle weakness (generalized): Secondary | ICD-10-CM

## 2016-05-17 HISTORY — DX: Narcolepsy without cataplexy: G47.419

## 2016-05-17 HISTORY — DX: Type 2 diabetes mellitus without complications: E11.9

## 2016-05-17 LAB — HEPATIC FUNCTION PANEL
ALBUMIN: 3.6 g/dL (ref 3.5–5.0)
ALT: 21 U/L (ref 17–63)
AST: 31 U/L (ref 15–41)
Alkaline Phosphatase: 91 U/L (ref 38–126)
BILIRUBIN INDIRECT: 0.6 mg/dL (ref 0.3–0.9)
Bilirubin, Direct: 0.1 mg/dL (ref 0.1–0.5)
TOTAL PROTEIN: 7 g/dL (ref 6.5–8.1)
Total Bilirubin: 0.7 mg/dL (ref 0.3–1.2)

## 2016-05-17 LAB — BASIC METABOLIC PANEL
ANION GAP: 5 (ref 5–15)
BUN: 17 mg/dL (ref 6–20)
CALCIUM: 9.4 mg/dL (ref 8.9–10.3)
CO2: 34 mmol/L — ABNORMAL HIGH (ref 22–32)
Chloride: 101 mmol/L (ref 101–111)
Creatinine, Ser: 0.83 mg/dL (ref 0.61–1.24)
GLUCOSE: 120 mg/dL — AB (ref 65–99)
POTASSIUM: 3.9 mmol/L (ref 3.5–5.1)
SODIUM: 140 mmol/L (ref 135–145)

## 2016-05-17 LAB — TROPONIN I: Troponin I: <0.03 ng/mL (ref <0.03)

## 2016-05-17 LAB — CBC
HCT: 41.2 % (ref 40.0–52.0)
HEMOGLOBIN: 13.6 g/dL (ref 13.0–18.0)
MCH: 31.4 pg (ref 26.0–34.0)
MCHC: 32.9 g/dL (ref 32.0–36.0)
MCV: 95.3 fL (ref 80.0–100.0)
Platelets: 129 10*3/uL — ABNORMAL LOW (ref 150–440)
RBC: 4.32 MIL/uL — AB (ref 4.40–5.90)
RDW: 14 % (ref 11.5–14.5)
WBC: 6 10*3/uL (ref 3.8–10.6)

## 2016-05-17 MED ORDER — CEFTRIAXONE SODIUM-DEXTROSE 2-2.22 GM-% IV SOLR
2.0000 g | INTRAVENOUS | Status: DC
  Administered 2016-05-18: 2 g via INTRAVENOUS
  Filled 2016-05-17: qty 50

## 2016-05-17 MED ORDER — ONDANSETRON HCL 4 MG/2ML IJ SOLN: 4.0000 mg | Freq: Four times a day (QID) | INTRAMUSCULAR | Status: DC | PRN

## 2016-05-17 MED ORDER — ASPIRIN 81 MG PO CHEW
CHEWABLE_TABLET | ORAL | Status: AC
Start: 2016-05-17 — End: 2016-05-17
  Administered 2016-05-17: 324 mg via ORAL
  Filled 2016-05-17: qty 4

## 2016-05-17 MED ORDER — AMPHETAMINE-DEXTROAMPHET ER 5 MG PO CP24
15.0000 mg | ORAL_CAPSULE | Freq: Three times a day (TID) | ORAL | Status: DC
  Administered 2016-05-17: 5 mg via ORAL
  Administered 2016-05-18 – 2016-05-19 (×2): 15 mg via ORAL
  Filled 2016-05-17 (×3): qty 3

## 2016-05-17 MED ORDER — ENOXAPARIN SODIUM 40 MG/0.4ML ~~LOC~~ SOLN
40.0000 mg | Freq: Two times a day (BID) | SUBCUTANEOUS | Status: DC
  Administered 2016-05-17 – 2016-05-18 (×2): 40 mg via SUBCUTANEOUS
  Filled 2016-05-17 (×2): qty 0.4

## 2016-05-17 MED ORDER — MORPHINE SULFATE (PF) 4 MG/ML IV SOLN
4.0000 mg | Freq: Once | INTRAVENOUS | Status: AC
  Administered 2016-05-17: 4 mg via INTRAVENOUS
  Filled 2016-05-17: qty 1

## 2016-05-17 MED ORDER — NITROGLYCERIN 0.4 MG SL SUBL
0.4000 mg | SUBLINGUAL_TABLET | SUBLINGUAL | Status: DC | PRN
  Administered 2016-05-17 – 2016-05-18 (×4): 0.4 mg via SUBLINGUAL
  Filled 2016-05-17 (×4): qty 1

## 2016-05-17 MED ORDER — SODIUM CHLORIDE 0.9% FLUSH
3.0000 mL | Freq: Two times a day (BID) | INTRAVENOUS | Status: DC
  Administered 2016-05-17 – 2016-05-19 (×5): 3 mL via INTRAVENOUS

## 2016-05-17 MED ORDER — SODIUM CHLORIDE 0.9 % IV SOLN: 250.0000 mL | INTRAVENOUS | Status: DC | PRN

## 2016-05-17 MED ORDER — CEFTRIAXONE SODIUM-DEXTROSE 1-3.74 GM-% IV SOLR
1.0000 g | Freq: Once | INTRAVENOUS | Status: AC
  Administered 2016-05-17: 1 g via INTRAVENOUS
  Filled 2016-05-17: qty 50

## 2016-05-17 MED ORDER — ASPIRIN 81 MG PO CHEW
324.0000 mg | CHEWABLE_TABLET | Freq: Once | ORAL | Status: AC
  Administered 2016-05-17: 324 mg via ORAL

## 2016-05-17 MED ORDER — ISOSORBIDE MONONITRATE ER 30 MG PO TB24
30.0000 mg | ORAL_TABLET | Freq: Every day | ORAL | Status: DC
  Administered 2016-05-17 – 2016-05-19 (×3): 30 mg via ORAL
  Filled 2016-05-17 (×3): qty 1

## 2016-05-17 MED ORDER — CEFTRIAXONE SODIUM-DEXTROSE 2-2.22 GM-% IV SOLR
2.0000 g | INTRAVENOUS | Status: DC
  Filled 2016-05-17: qty 50

## 2016-05-17 MED ORDER — COENZYME Q10 30 MG PO CAPS: 200.0000 mg | ORAL_CAPSULE | Freq: Two times a day (BID) | ORAL | Status: DC

## 2016-05-17 MED ORDER — DEXTROSE 5 % IV SOLN: 1.0000 g | INTRAVENOUS | Status: DC

## 2016-05-17 MED ORDER — GUAIFENESIN ER 600 MG PO TB12
600.0000 mg | ORAL_TABLET | Freq: Two times a day (BID) | ORAL | Status: DC
  Administered 2016-05-17 – 2016-05-19 (×4): 600 mg via ORAL
  Filled 2016-05-17 (×5): qty 1

## 2016-05-17 MED ORDER — CEFTRIAXONE SODIUM-DEXTROSE 1-3.74 GM-% IV SOLR
1.0000 g | Freq: Once | INTRAVENOUS | Status: AC
  Administered 2016-05-17: 1 g via INTRAVENOUS

## 2016-05-17 MED ORDER — PRASUGREL HCL 10 MG PO TABS
10.0000 mg | ORAL_TABLET | Freq: Every day | ORAL | Status: DC
  Administered 2016-05-17 – 2016-05-19 (×3): 10 mg via ORAL
  Filled 2016-05-17 (×3): qty 1

## 2016-05-17 MED ORDER — CEFTRIAXONE SODIUM-DEXTROSE 1-3.74 GM-% IV SOLR
INTRAVENOUS | Status: AC
  Administered 2016-05-17: 1 g via INTRAVENOUS
  Filled 2016-05-17: qty 50

## 2016-05-17 MED ORDER — GINKGO BILOBA 60 MG PO TABS: 60.0000 mg | ORAL_TABLET | ORAL | Status: DC

## 2016-05-17 MED ORDER — DEXTROSE 5 % IV SOLN
500.0000 mg | Freq: Once | INTRAVENOUS | Status: AC
  Administered 2016-05-17: 500 mg via INTRAVENOUS
  Filled 2016-05-17: qty 500

## 2016-05-17 MED ORDER — METOPROLOL SUCCINATE ER 25 MG PO TB24
25.0000 mg | ORAL_TABLET | Freq: Every day | ORAL | Status: DC
  Administered 2016-05-17 – 2016-05-19 (×3): 25 mg via ORAL
  Filled 2016-05-17 (×3): qty 1

## 2016-05-17 MED ORDER — ASPIRIN EC 81 MG PO TBEC
81.0000 mg | DELAYED_RELEASE_TABLET | Freq: Every day | ORAL | Status: DC
  Administered 2016-05-18 – 2016-05-19 (×2): 81 mg via ORAL
  Filled 2016-05-17 (×2): qty 1

## 2016-05-17 MED ORDER — ENOXAPARIN SODIUM 40 MG/0.4ML ~~LOC~~ SOLN: 40.0000 mg | SUBCUTANEOUS | Status: DC

## 2016-05-17 MED ORDER — ONDANSETRON HCL 4 MG/2ML IJ SOLN
4.0000 mg | INTRAMUSCULAR | Status: AC
  Administered 2016-05-17: 4 mg via INTRAVENOUS
  Filled 2016-05-17: qty 2

## 2016-05-17 MED ORDER — SODIUM CHLORIDE 0.9% FLUSH: 3.0000 mL | INTRAVENOUS | Status: DC | PRN

## 2016-05-17 MED ORDER — GABAPENTIN 300 MG PO CAPS
300.0000 mg | ORAL_CAPSULE | Freq: Three times a day (TID) | ORAL | Status: DC
  Administered 2016-05-17 – 2016-05-19 (×6): 300 mg via ORAL
  Filled 2016-05-17 (×6): qty 1

## 2016-05-17 MED ORDER — ROSUVASTATIN CALCIUM 20 MG PO TABS
20.0000 mg | ORAL_TABLET | Freq: Every day | ORAL | Status: DC
  Administered 2016-05-17 – 2016-05-19 (×3): 20 mg via ORAL
  Filled 2016-05-17 (×3): qty 1

## 2016-05-17 MED ORDER — DEXTROSE 5 % IV SOLN
500.0000 mg | INTRAVENOUS | Status: DC
  Administered 2016-05-17: 500 mg via INTRAVENOUS
  Filled 2016-05-17 (×2): qty 500

## 2016-05-17 MED ORDER — NIACIN ER (ANTIHYPERLIPIDEMIC) 500 MG PO TBCR
1000.0000 mg | EXTENDED_RELEASE_TABLET | Freq: Every day | ORAL | Status: DC
  Administered 2016-05-17 – 2016-05-18 (×2): 1000 mg via ORAL
  Filled 2016-05-17 (×5): qty 2

## 2016-05-17 MED ORDER — ACETAMINOPHEN 325 MG PO TABS
650.0000 mg | ORAL_TABLET | ORAL | Status: DC | PRN
Start: 2016-05-17 — End: 2016-05-19
  Administered 2016-05-17: 650 mg via ORAL
  Filled 2016-05-17: qty 2

## 2016-05-17 MED ORDER — TIOTROPIUM BROMIDE MONOHYDRATE 18 MCG IN CAPS
18.0000 ug | ORAL_CAPSULE | Freq: Every day | RESPIRATORY_TRACT | Status: DC
  Administered 2016-05-19: 18 ug via RESPIRATORY_TRACT
  Filled 2016-05-17: qty 5

## 2016-05-17 MED ORDER — MODAFINIL 100 MG PO TABS
200.0000 mg | ORAL_TABLET | Freq: Every day | ORAL | Status: DC
  Administered 2016-05-17 – 2016-05-19 (×3): 200 mg via ORAL
  Filled 2016-05-17 (×3): qty 2

## 2016-05-17 NOTE — Progress Notes (Signed)
PHARMACIST - PHYSICIAN ORDER COMMUNICATION  CONCERNING: P&T Medication Policy on Herbal Medications  DESCRIPTION:  This patient's order for: co-enzyme Q-10 capsule 210 mg and Ginko Biloba 51m  has been noted.  This product(s) is classified as an "herbal" or natural product. Due to a lack of definitive safety studies or FDA approval, nonstandard manufacturing practices, plus the potential risk of unknown drug-drug interactions while on inpatient medications, the Pharmacy and Therapeutics Committee does not permit the use of "herbal" or natural products of this type within [facility]   ACTION TAKEN: The pharmacy department is unable to verify this order at this time and your patient has been informed of this safety policy. Please reevaluate patient's clinical condition at discharge and address if the herbal or natural product(s) should be resumed at that time.  SNancy Fetter PharmD Clinical Pharmacist 05/17/2016 10:52 AM

## 2016-05-17 NOTE — Progress Notes (Signed)
Orders for enoxaparin 40 mg subcutaneously daily for DVT prophylaxis changed to 40 mg BID per anticoagulation protocol for CrCl > 30 mL/min and BMI > 40.  Darylene Price [facility]  05/17/16 10:48 AM

## 2016-05-17 NOTE — ED Notes (Signed)
Pharmacy called to advise that patient had order for Rocephin placed earlier. Pharmacist aware that patient has already received this medication. Per pharmacy, patient needs a second dose (Rocephin 1 gram) for a total of 2 grams; a second order has been entered by the pharmacist. MD made aware; Dr. Karma Greaser ok with patient receiving the pharmacy suggested dose. MD aware that patient is currently getting the ordered Azithromycin dose. Ok to start the second dose of Rocephin when the Azithromycin infusion is complete.

## 2016-05-17 NOTE — Progress Notes (Signed)
Pharmacy Antibiotic Note  Miguel Palmer is a 75 y.o. male admitted on 05/17/2016 with CAP.  Pharmacy has been consulted for ceftriaxone dosing.  Plan: Ceftriaxone 2 gm IV Q24H  Height: 6' 2"  (188 cm) Weight: (!) 343 lb (155.6 kg) IBW/kg (Calculated) : 82.2  Temp (24hrs), Avg:98.2 F (36.8 C), Min:98.2 F (36.8 C), Max:98.2 F (36.8 C)   Recent Labs Lab 05/17/16 0342  WBC 6.0  CREATININE 0.83    Estimated Creatinine Clearance: 119.5 mL/min (by C-G formula based on SCr of 0.83 mg/dL).    Allergies  Allergen Reactions  . Pollen Extract Shortness Of Breath    Post nasal drip  . Other Other (See Comments)    pepper  . Morphine And Related Itching    (+) itching and redness noted to IV insertion site following administration. See ED nurses note from 11/25 at 0449.   Thank you for allowing pharmacy to be a part of this patient's care.  Laural Benes, Pharm.D., BCPS Clinical Pharmacist 05/17/2016 6:20 AM

## 2016-05-17 NOTE — ED Triage Notes (Signed)
Pt states that he experienced some chest pain tonight with right sided jaw and arm pain. Pt has hx of stents and COPD. Pt is routinely on 3L O2. Pt is able to answer questions at this time without difficulty.

## 2016-05-17 NOTE — ED Notes (Signed)
Immediately following administration of IV Morphine dose, patient began to c/o of itching around the IV insertion site. Denies increased SOB that is different that what he presented to the ED with tonight. (+) peri-insertion site erythema noted. Symptoms quickly resolved. MD made aware.

## 2016-05-17 NOTE — ED Notes (Signed)
Dr. Benjie Karvonen called and informed of change in pt's HR. Pt allows nurse to place him on cardiac monitor at this time. HR 130's. Pt also given Nitro for chest pain.

## 2016-05-17 NOTE — H&P (Signed)
[facility] [facility] [place] NAME: Miguel Palmer    MR#:  [id number]  DATE OF BIRTH:  February 20, 1941  DATE OF ADMISSION:  05/17/2016  PRIMARY CARE PHYSICIAN: [name], MD   REQUESTING/REFERRING PHYSICIAN:   CHIEF COMPLAINT:   Chief Complaint  Patient presents with  . Chest Pain    HISTORY OF PRESENT ILLNESS: Miguel Palmer  is a 75 y.o. male with a known history of Coronary artery disease, cardiac stent, type 2 diabetes mellitus, hyperlipidemia, narcolepsy, COPD presented to the emergency room with chest pain. The chest pain started since 2 PM yesterday afternoon and located in the left side of the chest. Patient has chest pain coming on and off for the last 1 week but more severe since yesterday 2 PM. The chest pain increases whenever he takes a deep breath. Patient also complains of dry cough. No history of any fever or chills. The chest pain is aching in nature 4 out of 10 on a scale of 1-10. Patient was evaluated in the emergency room and he was found to have pneumonia and was given IV antibiotics. First set of troponin was negative. Hospitalist service was consulted for further care of the patient. No recent travel or sick contacts at home. No headache, dizziness and blurry vision. No syncope and seizure.  PAST MEDICAL HISTORY:   Past Medical History:  Diagnosis Date  . Arteriosclerosis    7 stents placed no s/s x 7 years  . Biceps tendinitis 05/29/2014  . COPD (chronic obstructive pulmonary disease) ([place])   . Diabetes mellitus without complication ([place])   . Disorder of rotator cuff 05/29/2014  . Edema    bilateral lower extremities  . Frank hematuria 04/28/2013  . Memory change 01/13/2014  . Narcolepsy   . Obesity   . 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 Oxygen deficiency     PAST SURGICAL HISTORY: Past Surgical History:  Procedure Laterality Date  . CORONARY STENT PLACEMENT    . KNEE ARTHROSCOPY Right     SOCIAL HISTORY:   Social History  Substance Use Topics  . Smoking status: Former Smoker    Packs/day: 2.00    Years: 22.00    Types: Cigarettes  . Smokeless tobacco: Former Systems developer    Quit date: 09/03/1980  . Alcohol use No    FAMILY HISTORY:  Family History  Problem Relation Age of Onset  . Dementia Mother   . Asthma Father   . Emphysema Father   . Alzheimer's disease Father     DRUG ALLERGIES:  Allergies  Allergen Reactions  . Pollen Extract Shortness Of Breath    Post nasal drip  . Other Other (See Comments)    pepper  . Morphine And Related Itching    (+) itching and redness noted to IV insertion site following administration. See ED nurses note from 11/25 at 0449.    REVIEW OF SYSTEMS:   CONSTITUTIONAL: No fever, has weakness.  EYES: No blurred or double vision.  EARS, NOSE, AND THROAT: No tinnitus or ear pain.  RESPIRATORY: Has dry cough  No shortness of breath, wheezing or hemoptysis.  CARDIOVASCULAR: Has chest pain,  No orthopnea, edema.  GASTROINTESTINAL: No nausea, vomiting, diarrhea or abdominal pain.  GENITOURINARY: No dysuria, hematuria.  ENDOCRINE: No polyuria, nocturia,  HEMATOLOGY: No anemia, easy bruising or bleeding SKIN: No rash or lesion. MUSCULOSKELETAL: No joint pain or arthritis.   NEUROLOGIC: No tingling, numbness, weakness.  PSYCHIATRY: No anxiety or depression.  MEDICATIONS AT HOME:  Prior to Admission medications   Medication Sig Start Date End Date Taking? Authorizing Provider  amphetamine-dextroamphetamine (ADDERALL XR) 15 MG 24 hr capsule Take 15 mg by mouth 3 (three) times daily.   Yes Historical Provider, MD  Cinnamon 500 MG capsule Take 500 mg by mouth daily.    Yes Historical Provider, MD  co-enzyme Q-10 30 MG capsule Take 200 mg by mouth 2 (two) times daily.    Yes Historical Provider, MD  [employer] OMEGA-3 KRILL OIL 300 MG CAPS Take 2 capsules by mouth daily.   Yes Historical Provider, MD  gabapentin (NEURONTIN) 400 MG capsule TAKE 2 CAPSULES, 1 400  mg and 1 300 mg  (700MG TOTAL) BY MOUTH 4 (FOUR) TIMES DAILY. 02/29/16  Yes Historical Provider, MD  Ginkgo Biloba 60 MG TABS Take 60 mg by mouth as directed.    Yes Historical Provider, MD  Glucosamine Sulfate 1000 MG CAPS Take by mouth daily.    Yes Historical Provider, MD  guaiFENesin (MUCINEX) 600 MG 12 hr tablet Take 600 mg by mouth 2 (two) times daily as needed.    Yes Historical Provider, MD  isosorbide mononitrate (IMDUR) 30 MG 24 hr tablet TAKE 1 TABLET BY MOUTH EVERY DAY 05/22/14  Yes Historical Provider, MD  metFORMIN (GLUCOPHAGE) 500 MG tablet TAKE 1 TABLET BY MOUTH DAILY. 10/17/14  Yes Historical Provider, MD  metoprolol succinate (TOPROL-XL) 25 MG 24 hr tablet TAKE 1 TABLET BY MOUTH EVERY DAY 08/11/14  Yes Historical Provider, MD  Misc Natural Products (TART CHERRY ADVANCED) CAPS Take 1 capsule by mouth daily.   Yes Historical Provider, MD  modafinil (PROVIGIL) 200 MG tablet three times daily 03/26/16  Yes Historical Provider, MD  Multiple Vitamins-Minerals (MULTIVITAMIN & MINERAL PO) Take by mouth.   Yes Historical Provider, MD  niacin (NIASPAN) 1000 MG CR tablet Take 1,000 mg by mouth at bedtime.  10/03/14  Yes Historical Provider, MD  nitroGLYCERIN (NITROSTAT) 0.4 MG SL tablet Place under the tongue. 10/09/13  Yes Historical Provider, MD  NON FORMULARY Take 400 mg by mouth daily.   Yes Historical Provider, MD  NON FORMULARY Take 1 Dose by mouth as needed. Takes 3 doses every week   Yes Historical Provider, MD  prasugrel (EFFIENT) 10 MG TABS tablet TAKE 1 TABLET BY MOUTH EVERY DAY 05/22/14  Yes Historical Provider, MD  rosuvastatin (CRESTOR) 20 MG tablet Take 20 mg by mouth daily.  10/02/14  Yes Historical Provider, MD  tiotropium (SPIRIVA) 18 MCG inhalation capsule Place 18 mcg into inhaler and inhale daily.   Yes Historical Provider, MD  UNABLE TO FIND Take 60 mg by mouth daily. Med Name: PHOSATIDYL SERINE  TAKE 1 TAB BY MOUTH EVERY DAY   Yes Historical Provider, MD  FLUZONE HIGH-DOSE  0.5 ML SUSY TO BE ADMINISTERED BY PHARMACIST FOR IMMUNIZATION 04/04/16   Historical Provider, MD      PHYSICAL EXAMINATION:   VITAL SIGNS: Blood pressure 98/83, pulse 62, temperature 98.2 F (36.8 C), temperature source Oral, resp. rate 18, height 6' 2"  (1.88 m), weight (!) 155.6 kg (343 lb), SpO2 91 %.  GENERAL:  75 y.o.-year-old obese patient lying in the bed with no acute distress.  EYES: Pupils equal, round, reactive to light and accommodation. No scleral icterus. Extraocular muscles intact.  HEENT: Head atraumatic, normocephalic. Oropharynx and nasopharynx clear.  NECK:  Supple, no jugular venous distention. No thyroid enlargement, no tenderness.  LUNGS: Decreased breath sounds bilaterally, bilateral rales heard in both lungs. No  use of accessory muscles of respiration.  CARDIOVASCULAR: S1, S2 normal. No murmurs, rubs, or gallops.  ABDOMEN: Soft, nontender, nondistended. Bowel sounds present. No organomegaly or mass.  EXTREMITIES: No pedal edema, cyanosis, or clubbing.  NEUROLOGIC: Cranial nerves II through XII are intact. Muscle strength 5/5 in all extremities. Sensation intact. Gait not checked.  PSYCHIATRIC: The patient is alert and oriented x 3.  SKIN: No obvious rash, lesion, or ulcer.   LABORATORY PANEL:   CBC  Recent Labs Lab 05/17/16 0342  WBC 6.0  HGB 13.6  HCT 41.2  PLT 129*  MCV 95.3  MCH 31.4  MCHC 32.9  RDW 14.0   ------------------------------------------------------------------------------------------------------------------  Chemistries   Recent Labs Lab 05/17/16 0342  NA 140  K 3.9  CL 101  CO2 34*  GLUCOSE 120*  BUN 17  CREATININE 0.83  CALCIUM 9.4  AST 31  ALT 21  ALKPHOS 91  BILITOT 0.7   ------------------------------------------------------------------------------------------------------------------ estimated creatinine clearance is 119.5 mL/min (by C-G formula based on SCr of 0.83  mg/dL). ------------------------------------------------------------------------------------------------------------------ No results for input(s): TSH, T4TOTAL, T3FREE, THYROIDAB in the last 72 hours.  Invalid input(s): FREET3   Coagulation profile No results for input(s): INR, PROTIME in the last 168 hours. ------------------------------------------------------------------------------------------------------------------- No results for input(s): DDIMER in the last 72 hours. -------------------------------------------------------------------------------------------------------------------  Cardiac Enzymes  Recent Labs Lab 05/17/16 0342  TROPONINI <0.03   ------------------------------------------------------------------------------------------------------------------ Invalid input(s): POCBNP  ---------------------------------------------------------------------------------------------------------------  Urinalysis No results found for: COLORURINE, APPEARANCEUR, LABSPEC, PHURINE, GLUCOSEU, HGBUR, BILIRUBINUR, KETONESUR, PROTEINUR, UROBILINOGEN, NITRITE, LEUKOCYTESUR   RADIOLOGY: Dg Chest 2 View  Result Date: 05/17/2016 CLINICAL DATA:  75 y/o  M; chest pain tonight. EXAM: CHEST  2 VIEW COMPARISON:  03/12/2015 CT chest.  08/06/2014 chest radiograph. FINDINGS: Stable cardiac silhouette within normal limits given projection and technique. Low lung volumes. Patchy bibasilar opacities greater on the right. Background of reticular markings greater at the lung base. No pleural effusion or pneumothorax. Bones are unremarkable. IMPRESSION: Patchy bibasilar opacities predominantly in right middle and lower lobes may represent pneumonia or atelectasis. Background of reticular markings may represent a component of fibrosis. Electronically Signed   By: Kristine Garbe M.D.   On: 05/17/2016 04:30    EKG: Orders placed or performed during the hospital encounter of 05/17/16  . EKG  12-Lead  . EKG 12-Lead  . ED EKG within 10 minutes  . ED EKG within 10 minutes    IMPRESSION AND PLAN: 75 year old male patient with history of coronary artery disease, cardiac stent, type 2 diabetes mellitus, hyperlipidemia, COPD, narcolepsy presented to the emergency room with chest pain which increases on deep breathing. Admitting diagnosis 1. Community-acquired pneumonia 2. Pleuritic chest pain 3. Coronary artery disease with history of cardiac stent 4. Hyperlipidemia 5. Type 2 diabetes mellitus Treatment plan Admit patient to telemetry observation bed Start patient on IV Rocephin and IV Zithromax antibiotics Cycle troponin to rule out ischemia Check echocardiogram Cardiogenic consultation Resume home medications Follow-up WBC count Supportive care  All the records are reviewed and case discussed with ED provider. Management plans discussed with the patient, family and they are in agreement.  CODE STATUS:FULL CODE Health Care Proxy : Raquel Sarna (spouse) Code Status History    This patient does not have a recorded code status. Please follow your organizational policy for patients in this situation.    Advance Directive Documentation   Flowsheet Row Most Recent Value  Type of Advance Directive  Healthcare Power of Attorney  Pre-existing out of facility DNR order (yellow form or  pink MOST form)  No data  "MOST" Form in Place?  No data       TOTAL TIME TAKING CARE OF THIS PATIENT: 53 minutes.    Saundra Shelling M.D on 05/17/2016 at 6:19 AM  Between 7am to 6pm - Pager - [phone]  After 6pm go to [email or web address] - password [email or web address] [facility]  [facility]  Office  [phone]  CC: Primary care physician; [name], MD

## 2016-05-17 NOTE — Consult Note (Signed)
[facility] Cardiology  CARDIOLOGY CONSULT NOTE  Patient ID: Miguel Palmer MRN: [id number] DOB/AGE: [birth year] 75 y.o.  Admit date: 05/17/2016 Referring Physician Mody Primary Physician Gaynelle Cage Cardiologist Nehemiah Massed Reason for Consultation Chest pain  HPI: 75 year old gentleman referred for evaluation of chest pain. The patient has known coronary artery disease, status post multiple coronary stents, while living in [place], last stent greater than 7 years ago. The patient reports she's been in his usual state of health until the last one to 2 weeks, when he started to develop intermittent episodes of right-sided chest discomfort. He presents to [facility] emergency room with worsening right-sided chest discomfort, pleuritic in nature, worse with breathing and cough. EKG revealed sinus rhythm, with incomplete right bundle branch block. Admission labs were notable for negative troponin. ECG revealed evidence for right middle lobe pneumonia.  Review of systems complete and found to be negative unless listed above     Past Medical History:  Diagnosis Date  . Arteriosclerosis    7 stents placed no s/s x 7 years  . Biceps tendinitis 05/29/2014  . COPD (chronic obstructive pulmonary disease) ([place])   . Diabetes mellitus without complication ([place])   . Disorder of rotator cuff 05/29/2014  . Edema    bilateral lower extremities  . Frank hematuria 04/28/2013  . Memory change 01/13/2014  . Narcolepsy   . Obesity   . 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 Oxygen deficiency     Past Surgical History:  Procedure Laterality Date  . CORONARY STENT PLACEMENT    . KNEE ARTHROSCOPY Right     Prescriptions Prior to Admission  Medication Sig Dispense Refill Last Dose  . amphetamine-dextroamphetamine (ADDERALL XR) 15 MG 24 hr capsule Take 15 mg by mouth 3 (three) times daily.   05/16/2016 at Unknown time  . Cinnamon 500 MG capsule Take 500 mg by mouth daily.    05/16/2016 at Unknown time  .  co-enzyme Q-10 30 MG capsule Take 200 mg by mouth 2 (two) times daily.    05/16/2016 at Unknown time  . CVS OMEGA-3 KRILL OIL 300 MG CAPS Take 2 capsules by mouth daily.   05/16/2016 at Unknown time  . gabapentin (NEURONTIN) 400 MG capsule TAKE 2 CAPSULES, 1 400 mg and 1 300 mg  (700MG TOTAL) BY MOUTH 4 (FOUR) TIMES DAILY.   05/16/2016 at Unknown time  . Ginkgo Biloba 60 MG TABS Take 60 mg by mouth as directed.    05/16/2016 at Unknown time  . Glucosamine Sulfate 1000 MG CAPS Take by mouth daily.    05/16/2016 at Unknown time  . guaiFENesin (MUCINEX) 600 MG 12 hr tablet Take 600 mg by mouth 2 (two) times daily as needed.    05/16/2016 at Unknown time  . isosorbide mononitrate (IMDUR) 30 MG 24 hr tablet TAKE 1 TABLET BY MOUTH EVERY DAY   05/16/2016 at Unknown time  . metFORMIN (GLUCOPHAGE) 500 MG tablet TAKE 1 TABLET BY MOUTH DAILY.   05/16/2016 at Unknown time  . metoprolol succinate (TOPROL-XL) 25 MG 24 hr tablet TAKE 1 TABLET BY MOUTH EVERY DAY   05/16/2016 at Unknown time  . Misc Natural Products (TART CHERRY ADVANCED) CAPS Take 1 capsule by mouth daily.   05/16/2016 at Unknown time  . modafinil (PROVIGIL) 200 MG tablet three times daily   05/16/2016 at Unknown time  . Multiple Vitamins-Minerals (MULTIVITAMIN & MINERAL PO) Take by mouth.   05/16/2016 at Unknown time  . niacin (NIASPAN) 1000 MG CR tablet Take 1,000 mg  by mouth at bedtime.    05/16/2016 at Unknown time  . nitroGLYCERIN (NITROSTAT) 0.4 MG SL tablet Place under the tongue.   Taking  . NON FORMULARY Take 400 mg by mouth daily.   05/16/2016 at Unknown time  . NON FORMULARY Take 1 Dose by mouth as needed. Takes 3 doses every week   Past Week at Unknown time  . prasugrel (EFFIENT) 10 MG TABS tablet TAKE 1 TABLET BY MOUTH EVERY DAY   05/16/2016 at Unknown time  . rosuvastatin (CRESTOR) 20 MG tablet Take 20 mg by mouth daily.    05/16/2016 at Unknown time  . tiotropium (SPIRIVA) 18 MCG inhalation capsule Place 18 mcg into inhaler and  inhale daily.   05/16/2016 at Unknown time  . UNABLE TO FIND Take 60 mg by mouth daily. Med Name: PHOSATIDYL SERINE  TAKE 1 TAB BY MOUTH EVERY DAY   05/16/2016 at Unknown time  . FLUZONE HIGH-DOSE 0.5 ML SUSY TO BE ADMINISTERED BY PHARMACIST FOR IMMUNIZATION  0 Completed Course at Unknown time   Social History   Social History  . Marital status: Married    Spouse name: N/A  . Number of children: N/A  . Years of education: N/A   Occupational History  . retired    Social History Main Topics  . Smoking status: Former Smoker    Packs/day: 2.00    Years: 22.00    Types: Cigarettes  . Smokeless tobacco: Former Systems developer    Quit date: 09/03/1980  . Alcohol use No  . Drug use: No  . Sexual activity: Not on file   Other Topics Concern  . Not on file   Social History Narrative  . No narrative on file    Family History  Problem Relation Age of Onset  . Dementia Mother   . Asthma Father   . Emphysema Father   . Alzheimer's disease Father       Review of systems complete and found to be negative unless listed above      PHYSICAL EXAM  General: Well developed, well nourished, in no acute distress HEENT:  Normocephalic and atramatic Neck:  No JVD.  Lungs: Clear bilaterally to auscultation and percussion. Heart: HRRR . Normal S1 and S2 without gallops or murmurs.  Abdomen: Bowel sounds are positive, abdomen soft and non-tender  Msk:  Back normal, normal gait. Normal strength and tone for age. Extremities: No clubbing, cyanosis or edema.   Neuro: Alert and oriented X 3. Psych:  Good affect, responds appropriately  Labs:   Lab Results  Component Value Date   WBC 6.0 05/17/2016   HGB 13.6 05/17/2016   HCT 41.2 05/17/2016   MCV 95.3 05/17/2016   PLT 129 (L) 05/17/2016    Recent Labs Lab 05/17/16 0342  NA 140  K 3.9  CL 101  CO2 34*  BUN 17  CREATININE 0.83  CALCIUM 9.4  PROT 7.0  BILITOT 0.7  ALKPHOS 91  ALT 21  AST 31  GLUCOSE 120*   Lab Results   Component Value Date   TROPONINI <0.03 05/17/2016   No results found for: CHOL No results found for: HDL No results found for: LDLCALC No results found for: TRIG No results found for: CHOLHDL No results found for: LDLDIRECT    Radiology: Dg Chest 2 View  Result Date: 05/17/2016 CLINICAL DATA:  75 y/o  M; chest pain tonight. EXAM: CHEST  2 VIEW COMPARISON:  03/12/2015 CT chest.  08/06/2014 chest radiograph. FINDINGS: Stable cardiac  silhouette within normal limits given projection and technique. Low lung volumes. Patchy bibasilar opacities greater on the right. Background of reticular markings greater at the lung base. No pleural effusion or pneumothorax. Bones are unremarkable. IMPRESSION: Patchy bibasilar opacities predominantly in right middle and lower lobes may represent pneumonia or atelectasis. Background of reticular markings may represent a component of fibrosis. Electronically Signed   By: Kristine Garbe M.D.   On: 05/17/2016 04:30    EKG: Sinus rhythm with incomplete right bundle branch block  ASSESSMENT AND PLAN:   1. Chest pain, pleuritic in nature, nondiagnostic ECG, negative troponin, likely due to community-acquired pneumonia 2. Community-acquired, right middle lobe pneumonia 3. Known CAD, status post multiple coronary stents, nondiagnostic ECG, negative troponin  Recommendations  1. Agree with current therapy 2. Defer full dose anticoagulation 3. Review 2-D echocardiogram   Signed: Isaias Cowman MD,PhD, [facility] 05/17/2016, 10:29 AM

## 2016-05-17 NOTE — ED Notes (Signed)
Pt  has removed telemetry and and BP cuff again and is sitting in chair in room. Wife remains at bedside. Nurse did observe a HR 103 before he removed lead. Nurse was able to inform Dr. Benjie Karvonen of change before getting off phone. New orders received for telemetry.

## 2016-05-17 NOTE — Progress Notes (Signed)
Patient briefly seen and examined this morning in the emergency room. Patient admitted for pleuritic chest pain and community-acquired pneumonia. Patient has history of CAD and cardiac stent. While in emergency room patient had tachycardia. Heart rates are better controlled now  Continue plan as outlined in H&P.

## 2016-05-17 NOTE — ED Provider Notes (Signed)
[facility] Emergency Department Provider Note  ____________________________________________   First MD Initiated Contact with Patient 05/17/16 [phone]     (approximate)  I have reviewed the triage vital signs and the nursing notes.   HISTORY  Chief Complaint Chest Pain    HPI Miguel Palmer is a 75 y.o. male with extensive medical history including coronary artery disease status post 7 stents, COPD on 3 L of oxygen at baseline, morbid obesity, and chronic pain syndrome(s) who presents for evaluation of chest pain radiating from the right side of his chest up to the right side of his jaw and his arm.  He states that he has been having episodes over the last week that do not seem to be related to exertion and can happen at rest.  It became very severe tonight and nothing makes it better nor worse.  He has severe chronic lung disease and always has a productive cough and is not sure if that is worse than usual but he states that he does become more short of breath than usual recently with even a minimal amount of exertion.  He denies fever, chills, night sweats, body aches, nausea, vomiting, abdominal pain.   Past Medical History:  Diagnosis Date  . Arteriosclerosis    7 stents placed no s/s x 7 years  . Biceps tendinitis 05/29/2014  . COPD (chronic obstructive pulmonary disease) ([place])   . Diabetes mellitus without complication ([place])   . Disorder of rotator cuff 05/29/2014  . Edema    bilateral lower extremities  . Frank hematuria 04/28/2013  . Memory change 01/13/2014  . Narcolepsy   . Obesity   . 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 Oxygen deficiency     Patient Active Problem List   Diagnosis Date Noted  . Diabetes mellitus without complication ([place]) 78/46/9629  . Chronic bilateral low back pain with right-sided sciatica 05/01/2016  . Long term current use of anticoagulant therapy 05/01/2016  . Chronic pain of right lower extremity  05/01/2016  . Spinal stenosis of lumbar region 05/01/2016  . Other spondylosis with radiculopathy, lumbar region 05/01/2016  . Spondylolisthesis of lumbar region 05/01/2016  . Chronic pain syndrome 05/01/2016  . Hypoxemia requiring supplemental oxygen 12/27/2015  . Morbid obesity with BMI of 40.0-44.9, adult ([place]) 11/22/2015  . Chronic respiratory failure with hypoxia ([place]) 09/05/2015  . Pulmonary fibrosis, unspecified ([place]) 09/05/2015  . Restrictive airway disease 06/20/2015  . Chronic obstructive pulmonary disease ([place]) 04/01/2015  . Mixed hyperlipidemia 01/05/2015  . Non-ST elevation myocardial infarction (NSTEMI), subendocardial infarction, subsequent episode of care ([place]) 01/05/2015  . Benign essential hypertension 01/04/2015  . HLD (hyperlipidemia) 10/31/2014  . Heart attack 10/31/2014  . Difficulty hearing 03/17/2014  . Abnormal gait 10/09/2013  . Obstructive apnea 08/30/2012  . 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 Prostate cancer screening 09/23/2011  . Vitamin D deficiency 09/23/2011  . Arteriosclerosis of coronary artery 09/04/2011  . CAD (coronary artery disease) 09/04/2011  . Allergic rhinitis 06/25/2011  . 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 Lower extremity weakness 03/30/2011  . Neuropathy ([place]) 01/17/2011    Past Surgical History:  Procedure Laterality Date  . CORONARY STENT PLACEMENT    . KNEE ARTHROSCOPY Right     Prior to Admission medications   Medication Sig Start Date End Date Taking? Authorizing Provider  amphetamine-dextroamphetamine (ADDERALL XR) 15 MG 24 hr capsule Take 15 mg by mouth 3 (three) times daily.   Yes Historical Provider, MD  Cinnamon 500 MG capsule Take 500  mg by mouth daily.    Yes Historical Provider, MD  co-enzyme Q-10 30 MG capsule Take 200 mg by mouth 2 (two) times daily.    Yes Historical Provider, MD  [employer] OMEGA-3 KRILL OIL 300 MG CAPS Take 2 capsules by mouth daily.   Yes Historical Provider, MD  gabapentin (NEURONTIN) 400 MG capsule TAKE  2 CAPSULES, 1 400 mg and 1 300 mg  (700MG TOTAL) BY MOUTH 4 (FOUR) TIMES DAILY. 02/29/16  Yes Historical Provider, MD  Ginkgo Biloba 60 MG TABS Take 60 mg by mouth as directed.    Yes Historical Provider, MD  Glucosamine Sulfate 1000 MG CAPS Take by mouth daily.    Yes Historical Provider, MD  guaiFENesin (MUCINEX) 600 MG 12 hr tablet Take 600 mg by mouth 2 (two) times daily as needed.    Yes Historical Provider, MD  isosorbide mononitrate (IMDUR) 30 MG 24 hr tablet TAKE 1 TABLET BY MOUTH EVERY DAY 05/22/14  Yes Historical Provider, MD  metFORMIN (GLUCOPHAGE) 500 MG tablet TAKE 1 TABLET BY MOUTH DAILY. 10/17/14  Yes Historical Provider, MD  metoprolol succinate (TOPROL-XL) 25 MG 24 hr tablet TAKE 1 TABLET BY MOUTH EVERY DAY 08/11/14  Yes Historical Provider, MD  Misc Natural Products (TART CHERRY ADVANCED) CAPS Take 1 capsule by mouth daily.   Yes Historical Provider, MD  modafinil (PROVIGIL) 200 MG tablet three times daily 03/26/16  Yes Historical Provider, MD  Multiple Vitamins-Minerals (MULTIVITAMIN & MINERAL PO) Take by mouth.   Yes Historical Provider, MD  niacin (NIASPAN) 1000 MG CR tablet Take 1,000 mg by mouth at bedtime.  10/03/14  Yes Historical Provider, MD  nitroGLYCERIN (NITROSTAT) 0.4 MG SL tablet Place under the tongue. 10/09/13  Yes Historical Provider, MD  NON FORMULARY Take 400 mg by mouth daily.   Yes Historical Provider, MD  NON FORMULARY Take 1 Dose by mouth as needed. Takes 3 doses every week   Yes Historical Provider, MD  prasugrel (EFFIENT) 10 MG TABS tablet TAKE 1 TABLET BY MOUTH EVERY DAY 05/22/14  Yes Historical Provider, MD  rosuvastatin (CRESTOR) 20 MG tablet Take 20 mg by mouth daily.  10/02/14  Yes Historical Provider, MD  tiotropium (SPIRIVA) 18 MCG inhalation capsule Place 18 mcg into inhaler and inhale daily.   Yes Historical Provider, MD  UNABLE TO FIND Take 60 mg by mouth daily. Med Name: PHOSATIDYL SERINE  TAKE 1 TAB BY MOUTH EVERY DAY   Yes Historical Provider, MD    FLUZONE HIGH-DOSE 0.5 ML SUSY TO BE ADMINISTERED BY PHARMACIST FOR IMMUNIZATION 04/04/16   Historical Provider, MD    Allergies Pollen extract; Other; and Morphine and related  Family History  Problem Relation Age of Onset  . Dementia Mother   . Asthma Father   . Emphysema Father   . Alzheimer's disease Father     Social History Social History  Substance Use Topics  . Smoking status: Former Smoker    Packs/day: 2.00    Years: 22.00    Types: Cigarettes  . Smokeless tobacco: Former Systems developer    Quit date: 09/03/1980  . Alcohol use No    Review of Systems Constitutional: No fever/chills Eyes: No visual changes. ENT: No sore throat. Cardiovascular: +chest pain. Respiratory: +shortness of breath. Gastrointestinal: No abdominal pain.  No nausea, no vomiting.  No diarrhea.  No constipation. Genitourinary: Negative for dysuria. Musculoskeletal: Negative for back pain. Skin: Negative for rash. Neurological: Negative for headaches, focal weakness or numbness.  10-point ROS otherwise negative.  ____________________________________________   PHYSICAL EXAM:  VITAL SIGNS: ED Triage Vitals  Enc Vitals Group     BP 05/17/16 0326 128/73     Pulse Rate 05/17/16 0326 76     Resp 05/17/16 0326 20     Temp 05/17/16 0326 98.2 F (36.8 C)     Temp Source 05/17/16 0326 Oral     SpO2 05/17/16 0326 94 %     Weight 05/17/16 0329 (!) 343 lb (155.6 kg)     Height 05/17/16 0329 6' 2"  (1.88 m)     Head Circumference --      Peak Flow --      Pain Score 05/17/16 0336 6     Pain Loc --      Pain Edu? --      Excl. in [place]? --     Constitutional: Alert and oriented. Appearance of chronic illness Eyes: Conjunctivae are normal. PERRL. EOMI. Head: Atraumatic. Nose: No congestion/rhinnorhea. Mouth/Throat: Mucous membranes are moist.  Oropharynx non-erythematous. Neck: No stridor.  No meningeal signs.   Cardiovascular: Normal rate, regular rhythm. Good peripheral circulation. Grossly  normal heart sounds. Respiratory: Normal respiratory effort.  No retractions. Lungs CTAB. On 3L O2 [place] Gastrointestinal: Soft and nontender. No distention.  Musculoskeletal: No lower extremity tenderness nor edema. No gross deformities of extremities. Neurologic:  Normal speech and language. No gross focal neurologic deficits are appreciated.  Skin:  Skin is warm, dry and intact. No rash noted.   ____________________________________________   LABS (all labs ordered are listed, but only abnormal results are displayed)  Labs Reviewed  BASIC METABOLIC PANEL - Abnormal; Notable for the following:       Result Value   CO2 34 (*)    Glucose, Bld 120 (*)    All other components within normal limits  CBC - Abnormal; Notable for the following:    RBC 4.32 (*)    Platelets 129 (*)    All other components within normal limits  CULTURE, BLOOD (ROUTINE X 2)  CULTURE, BLOOD (ROUTINE X 2)  TROPONIN I  HEPATIC FUNCTION PANEL   ____________________________________________  EKG  ED ECG REPORT I, Mava Suares, the attending physician, personally viewed and interpreted this ECG.  Date: 05/17/2016 EKG Time: 03:22 Rate: 79 Rhythm: normal sinus rhythm QRS Axis: normal Intervals: Left anterior fascicular block ST/T Wave abnormalities: normal Conduction Disturbances: none Narrative Interpretation: No evidence of acute ischemia  ____________________________________________  RADIOLOGY   Dg Chest 2 View  Result Date: 05/17/2016 CLINICAL DATA:  75 y/o  M; chest pain tonight. EXAM: CHEST  2 VIEW COMPARISON:  03/12/2015 CT chest.  08/06/2014 chest radiograph. FINDINGS: Stable cardiac silhouette within normal limits given projection and technique. Low lung volumes. Patchy bibasilar opacities greater on the right. Background of reticular markings greater at the lung base. No pleural effusion or pneumothorax. Bones are unremarkable. IMPRESSION: Patchy bibasilar opacities predominantly in right  middle and lower lobes may represent pneumonia or atelectasis. Background of reticular markings may represent a component of fibrosis. Electronically Signed   By: Kristine Garbe M.D.   On: 05/17/2016 04:30    ____________________________________________   PROCEDURES  Procedure(s) performed:   Procedures   Critical Care performed: No ____________________________________________   INITIAL IMPRESSION / ASSESSMENT AND PLAN / ED COURSE  Pertinent labs & imaging results that were available during my care of the patient were reviewed by me and considered in my medical decision making (see chart for details).  Probable pneumonia causing ischemic chest pain (angina) percent  baseline.  Given his high risk of ACS as well as his poor lung function at baseline and has persistent ongoing chest pain that is only gotten more severe over the last week, I will admit him for further management.  He is not hypoxemic on his usual oxygen and he is not tachycardic and I think that there are other diagnoses more likely than pulmonary embolism as described above; I will defer to the hospital as if they want to obtain a chest CT either for additional evaluation at this time or if he does not improve after coming into the hospital. Gave full dose aspirin.    ____________________________________________  FINAL CLINICAL IMPRESSION(S) / ED DIAGNOSES  Final diagnoses:  Community acquired pneumonia, unspecified laterality  Chronic bronchitis, unspecified chronic bronchitis type ([place])  Chest pain due to myocardial ischemia, unspecified ischemic chest pain type ([place])     MEDICATIONS GIVEN DURING THIS VISIT:  Medications  azithromycin (ZITHROMAX) 500 mg in dextrose 5 % 250 mL IVPB (not administered)  cefTRIAXone (ROCEPHIN) IVPB 1 g (1 g Intravenous Given 05/17/16 0530)  morphine 4 MG/ML injection 4 mg (4 mg Intravenous Given 05/17/16 0448)  ondansetron (ZOFRAN) injection 4 mg (4 mg Intravenous  Given 05/17/16 0448)  aspirin chewable tablet 324 mg (324 mg Oral Given 05/17/16 0530)     NEW OUTPATIENT MEDICATIONS STARTED DURING THIS VISIT:  New Prescriptions   No medications on file    Modified Medications   No medications on file    Discontinued Medications   DEXTROAMPHETAMINE (DEXEDRINE SPANSULE) 15 MG 24 HR CAPSULE    Take 15 mg by mouth 4 (four) times daily.    TIOTROPIUM (SPIRIVA) 18 MCG INHALATION CAPSULE    Place into inhaler and inhale.     Note:  This document was prepared using Dragon voice recognition software and may include unintentional dictation errors.    Hinda Kehr, MD 05/17/16 [phone]

## 2016-05-17 NOTE — ED Notes (Signed)
RN to bedside to obtain second set of blood cultures. Patient observed out of bed sitting on bench at bedside. Patient has removed all medical equipment with the exception of his oxygen. Patient reported that he had to go to the bathroom and "all of that shit had to go". RN asked patient to return to the bed so that monitoring equipment could be replaced; patient refused citing that he felt better sitting up. Will administer IV ABX sitting up on bench. MD aware.

## 2016-05-18 ENCOUNTER — Observation Stay
Admit: 2016-05-18 | Discharge: 2016-05-18 | Disposition: A | Discharge status: Home / Self Care | Payer: Medicare Other | Attending: Internal Medicine | Admitting: Internal Medicine

## 2016-05-18 DIAGNOSIS — J189 Pneumonia, unspecified organism: Secondary | ICD-10-CM | POA: Diagnosis not present

## 2016-05-18 LAB — BASIC METABOLIC PANEL
Anion gap: 5 (ref 5–15)
BUN: 25 mg/dL — ABNORMAL HIGH (ref 6–20)
CALCIUM: 9.1 mg/dL (ref 8.9–10.3)
CO2: 33 mmol/L — AB (ref 22–32)
CREATININE: 1.21 mg/dL (ref 0.61–1.24)
Chloride: 97 mmol/L — ABNORMAL LOW (ref 101–111)
GFR calc Af Amer: >60 mL/min (ref >60)
GFR, EST NON AFRICAN AMERICAN: 56 mL/min — AB (ref >60)
GLUCOSE: 131 mg/dL — AB (ref 65–99)
POTASSIUM: 4.5 mmol/L (ref 3.5–5.1)
SODIUM: 135 mmol/L (ref 135–145)

## 2016-05-18 LAB — CBC
HCT: 41 % (ref 40.0–52.0)
HEMOGLOBIN: 13.5 g/dL (ref 13.0–18.0)
MCH: 31.1 pg (ref 26.0–34.0)
MCHC: 33 g/dL (ref 32.0–36.0)
MCV: 94.2 fL (ref 80.0–100.0)
PLATELETS: 112 10*3/uL — AB (ref 150–440)
RBC: 4.35 MIL/uL — AB (ref 4.40–5.90)
RDW: 14.3 % (ref 11.5–14.5)
WBC: 9.1 10*3/uL (ref 3.8–10.6)

## 2016-05-18 LAB — LIPID PANEL
CHOLESTEROL: 94 mg/dL (ref 0–200)
HDL: 47 mg/dL (ref >40)
LDL Cholesterol: 35 mg/dL (ref 0–99)
TRIGLYCERIDES: 59 mg/dL (ref <150)
Total CHOL/HDL Ratio: 2 RATIO
VLDL: 12 mg/dL (ref 0–40)

## 2016-05-18 MED ORDER — CEFTRIAXONE SODIUM-DEXTROSE 1-3.74 GM-% IV SOLR
1.0000 g | Freq: Every day | INTRAVENOUS | Status: DC
  Administered 2016-05-19: 1 g via INTRAVENOUS
  Filled 2016-05-18: qty 50

## 2016-05-18 MED ORDER — IPRATROPIUM-ALBUTEROL 0.5-2.5 (3) MG/3ML IN SOLN
3.0000 mL | RESPIRATORY_TRACT | Status: DC
  Administered 2016-05-18 – 2016-05-19 (×5): 3 mL via RESPIRATORY_TRACT
  Filled 2016-05-18 (×5): qty 3

## 2016-05-18 MED ORDER — AZITHROMYCIN 250 MG PO TABS
500.0000 mg | ORAL_TABLET | Freq: Every day | ORAL | Status: DC
  Administered 2016-05-18: 500 mg via ORAL
  Filled 2016-05-18: qty 2

## 2016-05-18 NOTE — Progress Notes (Signed)
PT Cancellation Note  Patient Details Name: Miguel Palmer MRN: [id number] DOB: May 26, 1941   Cancelled Treatment:    Reason Eval/Treat Not Completed: Other (comment). Eval received and chart reviewed. Pt currently out of room at this time for testing. Not available for therapy at this time. Will re-attempt next available date.   Brit Wernette 05/18/2016, 11:15 AM  Greggory Stallion, PT, DPT [phone]

## 2016-05-18 NOTE — Progress Notes (Signed)
[place] [facility] [place] NAME: Miguel Palmer    MR#:  [id number]  DATE OF BIRTH:  10-Apr-1941  SUBJECTIVE:   Still with pleuritic chest pain and shortness of breath  REVIEW OF SYSTEMS:    Review of Systems  Constitutional: Negative.  Negative for chills, fever and malaise/fatigue.  HENT: Negative.  Negative for ear discharge, ear pain, hearing loss, nosebleeds and sore throat.   Eyes: Negative.  Negative for blurred vision and pain.  Respiratory: Positive for cough, sputum production and shortness of breath. Negative for hemoptysis and wheezing.   Cardiovascular: Positive for chest pain and leg swelling. Negative for palpitations.  Gastrointestinal: Negative.  Negative for abdominal pain, blood in stool, diarrhea, nausea and vomiting.  Genitourinary: Negative.  Negative for dysuria.  Musculoskeletal: Negative.  Negative for back pain.  Skin: Negative.   Neurological: Negative for dizziness, tremors, speech change, focal weakness, seizures and headaches.  Endo/Heme/Allergies: Negative.  Does not bruise/bleed easily.  Psychiatric/Behavioral: Negative.  Negative for depression, hallucinations and suicidal ideas.    Tolerating Diet: yes      DRUG ALLERGIES:   Allergies  Allergen Reactions  . Pollen Extract Shortness Of Breath    Post nasal drip  . Other Other (See Comments)    pepper  . Morphine And Related Itching    (+) itching and redness noted to IV insertion site following administration. See ED nurses note from 11/25 at 0449.    VITALS:  Blood pressure 98/64, pulse (!) 117, temperature 98.1 F (36.7 C), temperature source Oral, resp. rate 18, height 6' 2"  (1.88 m), weight (!) 154.4 kg (340 lb 4.8 oz), SpO2 93 %.  PHYSICAL EXAMINATION:   Physical Exam  Constitutional: He is oriented to person, place, and time and well-developed, well-nourished, and in no distress. No distress.  HENT:  Head: Normocephalic.  Eyes: No scleral  icterus.  Neck: Normal range of motion. Neck supple. No JVD present. No tracheal deviation present.  Cardiovascular: Normal rate, regular rhythm and normal heart sounds.  Exam reveals no gallop and no friction rub.   No murmur heard. Pulmonary/Chest: Effort normal and breath sounds normal. No respiratory distress. He has no wheezes. He has no rales. He exhibits no tenderness.  Abdominal: Soft. Bowel sounds are normal. He exhibits no distension and no mass. There is no tenderness. There is no rebound and no guarding.  Musculoskeletal: Normal range of motion. He exhibits edema.  Neurological: He is alert and oriented to person, place, and time.  Skin: Skin is warm. No rash noted. No erythema.  Psychiatric: Affect and judgment normal.      LABORATORY PANEL:   CBC  Recent Labs Lab 05/18/16 0605  WBC 9.1  HGB 13.5  HCT 41.0  PLT 112*   ------------------------------------------------------------------------------------------------------------------  Chemistries   Recent Labs Lab 05/17/16 0342 05/18/16 0605  NA 140 135  K 3.9 4.5  CL 101 97*  CO2 34* 33*  GLUCOSE 120* 131*  BUN 17 25*  CREATININE 0.83 1.21  CALCIUM 9.4 9.1  AST 31  --   ALT 21  --   ALKPHOS 91  --   BILITOT 0.7  --    ------------------------------------------------------------------------------------------------------------------  Cardiac Enzymes  Recent Labs Lab 05/17/16 1018 05/17/16 1602 05/17/16 2151  TROPONINI <0.03 <0.03 <0.03   ------------------------------------------------------------------------------------------------------------------  RADIOLOGY:  Dg Chest 2 View  Result Date: 05/17/2016 CLINICAL DATA:  75 y/o  M; chest pain tonight. EXAM: CHEST  2 VIEW COMPARISON:  03/12/2015  CT chest.  08/06/2014 chest radiograph. FINDINGS: Stable cardiac silhouette within normal limits given projection and technique. Low lung volumes. Patchy bibasilar opacities greater on the right.  Background of reticular markings greater at the lung base. No pleural effusion or pneumothorax. Bones are unremarkable. IMPRESSION: Patchy bibasilar opacities predominantly in right middle and lower lobes may represent pneumonia or atelectasis. Background of reticular markings may represent a component of fibrosis. Electronically Signed   By: Kristine Garbe M.D.   On: 05/17/2016 04:30     ASSESSMENT AND PLAN:   75 year old male with known history of, diabetes and COPD presenting with chest pain and found to have CAP.  1. Acute hypoxic respiratory failure in the setting f CAP: Continue to wean oxygen Continue Rocephin and azithromycin Continue ISS   2. Pleuritic chest pain due to CAP:  Patient has ruled out for ACS with negative troponins and has been evaluated by cardiology. Continue supportive therapy  3. CAP: Continue Rocephin and azithromycin Speech consult for a.m.due to location of pneumonia (RML)  4. CAD with cardiac stent: Continue Crestor, isosorbide, metoprolol. Effient and ASA  5 ADHD on Adderall and Provigil  6. COPD without exacerbation: continue inhalers   Management plans discussed with the patient and he is in agreement.  CODE STATUS: full  TOTAL TIME TAKING CARE OF THIS PATIENT: 33 minutes.     POSSIBLE D/C 1-2 days, DEPENDING ON CLINICAL CONDITION.   Willie Loy M.D on 05/18/2016 at 8:41 AM  Between 7am to 6pm - Pager - [phone] After 6pm go to [email or web address] - password [email or web address] [facility]  Office  [phone]  CC: Primary care physician; [name], MD  Note: This dictation was prepared with Dragon dictation along with smaller phrase technology. Any transcriptional errors that result from this process are unintentional.

## 2016-05-18 NOTE — Progress Notes (Signed)
Pharmacy Antibiotic Note  Miguel Palmer is a 75 y.o. male admitted on 05/17/2016 with CAP.  Pharmacy has been consulted for ceftriaxone dosing.  Plan: Decrease ceftriaxone dose to 1 g IV daily  Changed azithromycin 500 mg from IV to PO as patient meets criteria for automatic change.  Height: 6' 2"  (188 cm) Weight: (!) 340 lb 4.8 oz (154.4 kg) IBW/kg (Calculated) : 82.2  Temp (24hrs), Avg:98.4 F (36.9 C), Min:98.1 F (36.7 C), Max:98.8 F (37.1 C)   Recent Labs Lab 05/17/16 0342 05/18/16 0605  WBC 6.0 9.1  CREATININE 0.83 1.21    Estimated Creatinine Clearance: 81.6 mL/min (by C-G formula based on SCr of 1.21 mg/dL).    Allergies  Allergen Reactions  . Pollen Extract Shortness Of Breath    Post nasal drip  . Other Other (See Comments)    pepper  . Morphine And Related Itching    (+) itching and redness noted to IV insertion site following administration. See ED nurses note from 11/25 at 0449.   Thank you for allowing pharmacy to be a part of this patient's care.  Lenis Noon, Pharm.D., BCPS Clinical Pharmacist 05/18/2016 11:06 AM

## 2016-05-18 NOTE — Care Management Obs Status (Signed)
[place] NOTIFICATION   Patient Details  Name: Vipul Cafarelli MRN: [id number] Date of Birth: 1941-06-01   Medicare Observation Status Notification Given:  Yes    Ival Bible, RN 05/18/2016, 11:05 AM

## 2016-05-19 DIAGNOSIS — J189 Pneumonia, unspecified organism: Secondary | ICD-10-CM | POA: Diagnosis not present

## 2016-05-19 LAB — ECHOCARDIOGRAM COMPLETE
Height: 74 in
Weight: 5444.8 oz

## 2016-05-19 MED ORDER — IPRATROPIUM-ALBUTEROL 0.5-2.5 (3) MG/3ML IN SOLN
3.0000 mL | Freq: Three times a day (TID) | RESPIRATORY_TRACT | Status: DC
  Filled 2016-05-19: qty 3

## 2016-05-19 MED ORDER — LEVOFLOXACIN 750 MG PO TABS: 750.0000 mg | ORAL_TABLET | Freq: Every day | ORAL | 0 refills | Status: DC

## 2016-05-19 NOTE — Discharge Summary (Signed)
[facility] NAME: Miguel Palmer    MR#:  [id number]  DATE OF BIRTH:  04-24-1941  DATE OF ADMISSION:  05/17/2016 ADMITTING PHYSICIAN: Saundra Shelling, MD  DATE OF DISCHARGE: 05/19/2016  PRIMARY CARE PHYSICIAN: [name], MD    ADMISSION DIAGNOSIS:  Chronic bronchitis, unspecified chronic bronchitis type ([place]) [J42] Chest pain due to myocardial ischemia, unspecified ischemic chest pain type ([place]) [I20.9] Community acquired pneumonia, unspecified laterality [J18.9]  DISCHARGE DIAGNOSIS:  Principal Problem:   Community acquired pneumonia Active Problems:   Chest pain   SECONDARY DIAGNOSIS:   Past Medical History:  Diagnosis Date  . Arteriosclerosis    7 stents placed no s/s x 7 years  . Biceps tendinitis 05/29/2014  . COPD (chronic obstructive pulmonary disease) ([place])   . Diabetes mellitus without complication ([place])   . Disorder of rotator cuff 05/29/2014  . Edema    bilateral lower extremities  . [place] hematuria 04/28/2013  . Memory change 01/13/2014  . Narcolepsy   . Obesity   . 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 Oxygen deficiency     HOSPITAL COURSE:  75 year old male with known history of, diabetes and COPD presenting with chest pain and found to have CAP.  1. Acute hypoxic respiratory failure in the setting of CAP:He has responded well to antibiotics. He will be discharged with oral Levaquin. He will continue home oxygen.    2. Pleuritic chest pain due to CAP:  Patient has ruled out for ACS with negative troponins and has been evaluated by cardiology.   3. CAP:he will be discharged with Levaquin. He was on Rocephin and azithromycin while in the hospital. 4. CAD with cardiac stent: Continue Crestor, isosorbide, metoprolol. Effient and ASA  5 ADHD on Adderall and Provigil  6. COPD without exacerbation: he willcontinue inhalers   DISCHARGE CONDITIONS AND DIET:   Table on diabetic  diet  CONSULTS OBTAINED:  Treatment Team:  Isaias Cowman, MD  DRUG ALLERGIES:   Allergies  Allergen Reactions  . Pollen Extract Shortness Of Breath    Post nasal drip  . Other Other (See Comments)    pepper  . Morphine And Related Itching    (+) itching and redness noted to IV insertion site following administration. See ED nurses note from 11/25 at 0449.    DISCHARGE MEDICATIONS:   Current Discharge Medication List    START taking these medications   Details  levofloxacin (LEVAQUIN) 750 MG tablet Take 1 tablet (750 mg total) by mouth daily. Qty: 5 tablet, Refills: 0      CONTINUE these medications which have NOT CHANGED   Details  amphetamine-dextroamphetamine (ADDERALL XR) 15 MG 24 hr capsule Take 15 mg by mouth 3 (three) times daily.    Cinnamon 500 MG capsule Take 500 mg by mouth daily.     co-enzyme Q-10 30 MG capsule Take 200 mg by mouth 2 (two) times daily.     [employer] OMEGA-3 KRILL OIL 300 MG CAPS Take 2 capsules by mouth daily.    gabapentin (NEURONTIN) 400 MG capsule TAKE 2 CAPSULES, 1 400 mg and 1 300 mg  (700MG TOTAL) BY MOUTH 4 (FOUR) TIMES DAILY.    Ginkgo Biloba 60 MG TABS Take 60 mg by mouth as directed.     Glucosamine Sulfate 1000 MG CAPS Take by mouth daily.     guaiFENesin (MUCINEX) 600 MG 12 hr tablet Take 600 mg by mouth 2 (two) times daily as needed.  isosorbide mononitrate (IMDUR) 30 MG 24 hr tablet TAKE 1 TABLET BY MOUTH EVERY DAY    metFORMIN (GLUCOPHAGE) 500 MG tablet TAKE 1 TABLET BY MOUTH DAILY.    metoprolol succinate (TOPROL-XL) 25 MG 24 hr tablet TAKE 1 TABLET BY MOUTH EVERY DAY    Misc Natural Products (TART CHERRY ADVANCED) CAPS Take 1 capsule by mouth daily.    modafinil (PROVIGIL) 200 MG tablet three times daily    Multiple Vitamins-Minerals (MULTIVITAMIN & MINERAL PO) Take by mouth.    niacin (NIASPAN) 1000 MG CR tablet Take 1,000 mg by mouth at bedtime.     nitroGLYCERIN (NITROSTAT) 0.4 MG SL tablet Place under the  tongue.    !! NON FORMULARY Take 400 mg by mouth daily.    !! NON FORMULARY Take 1 Dose by mouth as needed. Takes 3 doses every week    prasugrel (EFFIENT) 10 MG TABS tablet TAKE 1 TABLET BY MOUTH EVERY DAY    rosuvastatin (CRESTOR) 20 MG tablet Take 20 mg by mouth daily.     tiotropium (SPIRIVA) 18 MCG inhalation capsule Place 18 mcg into inhaler and inhale daily.    UNABLE TO FIND Take 60 mg by mouth daily. Med Name: PHOSATIDYL SERINE  TAKE 1 TAB BY MOUTH EVERY DAY    FLUZONE HIGH-DOSE 0.5 ML SUSY TO BE ADMINISTERED BY PHARMACIST FOR IMMUNIZATION Refills: 0     !! - Potential duplicate medications found. Please discuss with provider.            Today   CHIEF COMPLAINT:   patien twith improved symptoms this morning.   VITAL SIGNS:  Blood pressure 109/72, pulse 92, temperature 98.3 F (36.8 C), temperature source Oral, resp. rate 18, height 6' 2"  (1.88 m), weight (!) 154.4 kg (340 lb 4.8 oz), SpO2 94 %.   REVIEW OF SYSTEMS:  Review of Systems  Constitutional: Negative.  Negative for chills, fever and malaise/fatigue.  HENT: Negative.  Negative for ear discharge, ear pain, hearing loss, nosebleeds and sore throat.   Eyes: Negative.  Negative for blurred vision and pain.  Respiratory: Positive for cough and shortness of breath. Negative for hemoptysis and wheezing.   Cardiovascular: Negative.  Negative for chest pain, palpitations and leg swelling.  Gastrointestinal: Negative.  Negative for abdominal pain, blood in stool, diarrhea, nausea and vomiting.  Genitourinary: Negative.  Negative for dysuria.  Musculoskeletal: Negative.  Negative for back pain.  Skin: Negative.   Neurological: Negative for dizziness, tremors, speech change, focal weakness, seizures and headaches.  Endo/Heme/Allergies: Negative.  Does not bruise/bleed easily.  Psychiatric/Behavioral: Negative.  Negative for depression, hallucinations and suicidal ideas.     PHYSICAL EXAMINATION:   GENERAL:  75 y.o.-year-old patient lying in the bed with no acute distress.  NECK:  Supple, no jugular venous distention. No thyroid enlargement, no tenderness.  LUNGS: crackles right lung, no wheezing, rales,rhonchi  No use of accessory muscles of respiration.  CARDIOVASCULAR: S1, S2 normal. No murmurs, rubs, or gallops.  ABDOMEN: Soft, non-tender, non-distended. Bowel sounds present. No organomegaly or mass.  EXTREMITIES: No pedal edema, cyanosis, or clubbing.  PSYCHIATRIC: The patient is alert and oriented x 3.  SKIN: No obvious rash, lesion, or ulcer.   DATA REVIEW:   CBC  Recent Labs Lab 05/18/16 0605  WBC 9.1  HGB 13.5  HCT 41.0  PLT 112*    Chemistries   Recent Labs Lab 05/17/16 0342 05/18/16 0605  NA 140 135  K 3.9 4.5  CL 101 97*  CO2  34* 33*  GLUCOSE 120* 131*  BUN 17 25*  CREATININE 0.83 1.21  CALCIUM 9.4 9.1  AST 31  --   ALT 21  --   ALKPHOS 91  --   BILITOT 0.7  --     Cardiac Enzymes  Recent Labs Lab 05/17/16 1018 05/17/16 1602 05/17/16 2151  TROPONINI <0.03 <0.03 <0.03    Microbiology Results  @[email or web address] @  RADIOLOGY:  No results found.    Management plans discussed with the patient and he is in agreement. Stable for discharge home  Patient should follow up with pcp  CODE STATUS:     Code Status Orders        Start     Ordered   05/17/16 1002  Full code  Continuous     05/17/16 1001    Code Status History    Date Active Date Inactive Code Status Order ID Comments User Context   This patient has a current code status but no historical code status.    Advance Directive Documentation   Flowsheet Row Most Recent Value  Type of Advance Directive  Healthcare Power of Attorney  Pre-existing out of facility DNR order (yellow form or pink MOST form)  No data  "MOST" Form in Place?  No data      TOTAL TIME TAKING CARE OF THIS PATIENT: 33 minutes.    Note: This dictation was prepared with Dragon dictation along with  smaller phrase technology. Any transcriptional errors that result from this process are unintentional.  Everlene Cunning M.D on 05/19/2016 at 11:06 AM  Between 7am to 6pm - Pager - [phone] After 6pm go to [email or web address] - password [email or web address] [facility]  Office  [phone]  CC: Primary care physician; [name], MD

## 2016-05-19 NOTE — Progress Notes (Signed)
[facility]  SUBJECTIVE: My chest pain is better   Vitals:   05/18/16 2021 05/19/16 0358 05/19/16 0748 05/19/16 1130  BP: 111/71 109/72  122/83  Pulse: (!) 126 92  93  Resp: 18 18    Temp: 98 F (36.7 C) 98.3 F (36.8 C)  97.2 F (36.2 C)  TempSrc: Oral Oral  Oral  SpO2: 93% 90% 94% 90%  Weight:      Height:         Intake/Output Summary (Last 24 hours) at 05/19/16 1303 Last data filed at 05/18/16 2300  Gross per 24 hour  Intake              240 ml  Output                0 ml  Net              240 ml      PHYSICAL EXAM  General: Well developed, well nourished, in no acute distress HEENT:  Normocephalic and atramatic Neck:  No JVD.  Lungs: Clear bilaterally to auscultation and percussion. Heart: HRRR . Normal S1 and S2 without gallops or murmurs.  Abdomen: Bowel sounds are positive, abdomen soft and non-tender  Msk:  Back normal, normal gait. Normal strength and tone for age. Extremities: No clubbing, cyanosis or edema.   Neuro: Alert and oriented X 3. Psych:  Good affect, responds appropriately   LABS: Basic Metabolic Panel:  Recent Labs  05/17/16 0342 05/18/16 0605  NA 140 135  K 3.9 4.5  CL 101 97*  CO2 34* 33*  GLUCOSE 120* 131*  BUN 17 25*  CREATININE 0.83 1.21  CALCIUM 9.4 9.1   Liver Function Tests:  Recent Labs  05/17/16 0342  AST 31  ALT 21  ALKPHOS 91  BILITOT 0.7  PROT 7.0  ALBUMIN 3.6   No results for input(s): LIPASE, AMYLASE in the last 72 hours. CBC:  Recent Labs  05/17/16 0342 05/18/16 0605  WBC 6.0 9.1  HGB 13.6 13.5  HCT 41.2 41.0  MCV 95.3 94.2  PLT 129* 112*   Cardiac Enzymes:  Recent Labs  05/17/16 1018 05/17/16 1602 05/17/16 2151  TROPONINI <0.03 <0.03 <0.03   BNP: Invalid input(s): POCBNP D-Dimer: No results for input(s): DDIMER in the last 72 hours. Hemoglobin A1C: No results for input(s): HGBA1C in the last 72 hours. Fasting Lipid Panel:  Recent Labs  05/18/16 0605  CHOL 94  HDL 47   LDLCALC 35  TRIG 59  CHOLHDL 2.0   Thyroid Function Tests: No results for input(s): TSH, T4TOTAL, T3FREE, THYROIDAB in the last 72 hours.  Invalid input(s): FREET3 Anemia Panel: No results for input(s): VITAMINB12, FOLATE, FERRITIN, TIBC, IRON, RETICCTPCT in the last 72 hours.  No results found.   Echo normal left ventricular function, with LVEF 55-65%  TELEMETRY: Normal sinus rhythm:  ASSESSMENT AND PLAN:  Principal Problem:   Community acquired pneumonia Active Problems:   Chest pain    1. Chest pain, pleuritic in nature, nondiagnostic ECG, negative troponin, likely secondary to pneumonia 2. Community-acquired pneumonia 3. Known CAD, status post multiple coronary stents, normal left ventricular function by 2-D echocardiogram  Recommendations  1. Agree with current therapy 2. Defer full dose anticoagulation  Signed off for now, please call if any questions   Isaias Cowman, MD, PhD, [facility] 05/19/2016 1:03 PM

## 2016-05-19 NOTE — Care Management Important Message (Signed)
Important Message  Patient Details  Name: Tasman Zapata MRN: [id number] Date of Birth: 1940/11/11   Medicare Important Message Given:  N/A - LOS <3 / Initial given by admissions    Katrina Stack, RN 05/19/2016, 12:55 PM

## 2016-05-19 NOTE — Progress Notes (Signed)
Discharge instructions and prescription given with verbalized understanding.  Patient informed wheelchair will be delivered to home tomorrow.  IV's removed per policy and procedure.  Patient taken to visitors entrance via wheelchair by nurse due to being on oxygen.  Patient placed on personal 02 concentrator in car to be taken home by wife in personal vehicle.

## 2016-05-19 NOTE — Care Management (Addendum)
Patient presents from home where he resides with his wife.  Has chronic home 02 through [place].  He has participated in an outpatient pulmonary rehab on [facility] site and participated in another exercise class with his wife.  he would like to be able to take the class with his wife.  She would not qualify for the pulmonary rehab program.  Discussed with patient to follow same process to initiate return to the exercise class with his wife as he did on prior occasion.  Patient is agreeable to home health nurse.  Agency preference for the walker and home health is [facility].  Provided [facility] with referral.  Patient will require a bariatric wheelchair.  He uses cane's at home.  His progressive shortness of breath is limiting his ambulation status.      [place] Patient suffers from COPD  which impairs their ability to perform daily activities like  prolonged ambulation, exertion while performing adls in the home. A  walker will not resolve  issue with performing activities of daily living. A wheelchair will allow patient to safely perform daily activities. Patient can safely propel the wheelchair in the home or has a caregiver who can provide assistance.

## 2016-05-19 NOTE — Evaluation (Signed)
Physical Therapy Evaluation Patient Details Name: Miguel Palmer MRN: [id number] DOB: [birth year] Today's Date: 05/19/2016   History of Present Illness  Pt admitted for complaints of chest pain on L side. Pt with history of CAD, cardiac stent, DM, COPD, and obesity. Pt now diagnosed with community acquired pneumonia.   Clinical Impression  Pt is a pleasant 75 year old male who was admitted for CAP. Pt performs transfers with mod I and ambulation with supervision without AD for short distance. Pt uses SPC for all mobility usually. HR elevated 141 pre and 136 post. Pt also on 3L of O2 (baseline) decreasing to 82% with minimal endurance. Pt reports limited ambulation secondary to back issues. He demonstrates good balance at this time. Wheelchair may be of assistance for long distances as he has to walk increased distance from his apartment to car. He has participated in [employer] program before and would like to return to improve endurance and strength. Pt demonstrates deficits with endurance/B LE strength. Pt is very close to baseline level. Safe to ambulate with RN in hallways while on O2. Would benefit from skilled PT to address above deficits and promote optimal return to PLOF during acute care stay.      Follow Up Recommendations  ([facility] pulm rehab vs OP PT)    Equipment Recommendations  Wheelchair (measurements PT) (for endurance)    Recommendations for Other Services       Precautions / Restrictions Precautions Precautions: Fall Restrictions Weight Bearing Restrictions: No      Mobility  Bed Mobility               General bed mobility comments: not performed as pt received in reclienr  Transfers Overall transfer level: Modified independent Equipment used: None             General transfer comment: safe technique performed with ability to push from seated surface. Once standing, stands with upright posture.  Ambulation/Gait Ambulation/Gait assistance:  Supervision Ambulation Distance (Feet): 40 Feet Assistive device: None Gait Pattern/deviations: Step-through pattern     General Gait Details: ambulated on 3L of O2 without AD with safe technique. Further distance limited secondary to decreased O2 sats at 82%. SOB symptoms noted. Reciprocal gait pattern performed.  Stairs            Wheelchair Mobility    Modified Rankin (Stroke Patients Only)       Balance Overall balance assessment: Modified Independent                                           Pertinent Vitals/Pain Pain Assessment: No/denies pain    Home Living Family/patient expects to be discharged to:: Private residence Living Arrangements: Spouse/significant other Available Help at Discharge: Family Type of Home: Apartment Home Access: Elevator;Level entry     Home Layout: One level Home Equipment: Cane - single point      Prior Function Level of Independence: Independent with assistive device(s)         Comments: uses SPC for all mobility and shopping cart when out in community for B support     Hand Dominance        Extremity/Trunk Assessment   Upper Extremity Assessment: Overall WFL for tasks assessed           Lower Extremity Assessment: Generalized weakness (B LE quad 4/5; hamstrings 5/5)  Communication   Communication: No difficulties  Cognition Arousal/Alertness: Awake/alert Behavior During Therapy: WFL for tasks assessed/performed Overall Cognitive Status: Within Functional Limits for tasks assessed                      General Comments      Exercises Other Exercises Other Exercises: Educated pt on HEP including seated toe raises, SLRs, and eccentric LAQ. Pt educated on frequency and duration   Assessment/Plan    PT Assessment Patient needs continued PT services  PT Problem List Decreased strength;Decreased range of motion;Decreased activity tolerance;Obesity;Cardiopulmonary status  limiting activity          PT Treatment Interventions Gait training;DME instruction;Therapeutic exercise    PT Goals (Current goals can be found in the Care Plan section)  Acute Rehab PT Goals Patient Stated Goal: to get stronger PT Goal Formulation: With patient Time For Goal Achievement: 06/02/16 Potential to Achieve Goals: Good    Frequency Min 2X/week   Barriers to discharge        Co-evaluation               End of Session Equipment Utilized During Treatment: Oxygen Activity Tolerance: Patient limited by fatigue Patient left: in chair;with chair alarm set Nurse Communication: Mobility status    Functional Assessment Tool Used: clinical judgement" Functional Limitation: Mobility: Walking and moving around Mobility: Walking and Moving Around Current Status [phone]): At least 1 percent but less than 20 percent impaired, limited or restricted Mobility: Walking and Moving Around Goal Status [phone]): 0 percent impaired, limited or restricted    Time: 7076-1518 PT Time Calculation (min) (ACUTE ONLY): 19 min   Charges:   PT Evaluation $PT Eval Low Complexity: 1 Procedure     PT G Codes:   PT G-Codes **NOT FOR INPATIENT CLASS** Functional Assessment Tool Used: clinical judgement" Functional Limitation: Mobility: Walking and moving around Mobility: Walking and Moving Around Current Status (D4373): At least 1 percent but less than 20 percent impaired, limited or restricted Mobility: Walking and Moving Around Goal Status [phone]): 0 percent impaired, limited or restricted    Tangelia Sanson 05/19/2016, 10:12 AM  Greggory Stallion, PT, DPT [phone]

## 2016-05-22 LAB — CULTURE, BLOOD (ROUTINE X 2)
CULTURE: NO GROWTH
Culture: NO GROWTH

## 2016-05-22 NOTE — Progress Notes (Signed)
Subjective:  Shortness of breath and atypical chest pain seems to feel reasonably well now  Objective:  Vital Signs in the last 24 hours:    Intake/Output from previous day: No intake/output data recorded. Intake/Output from this shift: No intake/output data recorded.  Physical Exam: General appearance: appears stated age Neck: no adenopathy, no carotid bruit, no JVD, supple, symmetrical, trachea midline and thyroid not enlarged, symmetric, no tenderness/mass/nodules Lungs: clear to auscultation bilaterally Heart: regular rate and rhythm, S1, S2 normal, no murmur, click, rub or gallop Abdomen: soft, non-tender; bowel sounds normal; no masses,  no organomegaly Extremities: extremities normal, atraumatic, no cyanosis or edema Pulses: 2+ and symmetric Skin: Skin color, texture, turgor normal. No rashes or lesions Neurologic: Alert and oriented X 3, normal strength and tone. Normal symmetric reflexes. Normal coordination and gait  Lab Results: No results for input(s): WBC, HGB, PLT in the last 72 hours. No results for input(s): NA, K, CL, CO2, GLUCOSE, BUN, CREATININE in the last 72 hours. No results for input(s): TROPONINI in the last 72 hours.  Invalid input(s): CK, MB Hepatic Function Panel No results for input(s): PROT, ALBUMIN, AST, ALT, ALKPHOS, BILITOT, BILIDIR, IBILI in the last 72 hours. No results for input(s): CHOL in the last 72 hours. No results for input(s): PROTIME in the last 72 hours.  Imaging: Imaging results have been reviewed  Cardiac Studies:  Assessment/Plan:  Chest pain Pneumonia Obstructive sleep apnea  Obesity Coronary artery disease COPD Diabetes Hypoxemia Hyperlipidemia History of myocardial infarction Restrictive airway disease Mild pulmonary fibrosis Elevated troponin . PLAN Recommend continue medical therapy Previous IV antibiotic therapy Continue pain control as necessary for pleuritic pain DJD diffusely continue conservative  care Supplemental oxygen and inhalers as necessary Coronary disease appears to be reasonably stable doubt that this is cardiac Per the patient follow-up with cardiology as an outpatient   LOS: 0 days    Avayah Raffety D Kidus Delman 05/22/2016, 2:01 PM

## 2016-06-06 ENCOUNTER — Other Ambulatory Visit: Payer: Self-pay | Admitting: Nurse Practitioner

## 2016-06-06 DIAGNOSIS — I2510 Atherosclerotic heart disease of native coronary artery without angina pectoris: Secondary | ICD-10-CM

## 2016-06-24 ENCOUNTER — Emergency Department: Payer: Medicare Other

## 2016-06-24 ENCOUNTER — Inpatient Hospital Stay
Admission: EM | Admit: 2016-06-24 | Discharge: 2016-06-27 | DRG: 871 | Disposition: A | Discharge status: Home Health | Payer: Medicare Other | Attending: Internal Medicine | Admitting: Internal Medicine

## 2016-06-24 ENCOUNTER — Encounter: Payer: Self-pay | Admitting: Emergency Medicine

## 2016-06-24 DIAGNOSIS — E1140 Type 2 diabetes mellitus with diabetic neuropathy, unspecified: Secondary | ICD-10-CM | POA: Diagnosis present

## 2016-06-24 DIAGNOSIS — Z7984 Long term (current) use of oral hypoglycemic drugs: Secondary | ICD-10-CM

## 2016-06-24 DIAGNOSIS — M6281 Muscle weakness (generalized): Secondary | ICD-10-CM

## 2016-06-24 DIAGNOSIS — Z79899 Other long term (current) drug therapy: Secondary | ICD-10-CM

## 2016-06-24 DIAGNOSIS — A419 Sepsis, unspecified organism: Principal | ICD-10-CM | POA: Diagnosis present

## 2016-06-24 DIAGNOSIS — J9620 Acute and chronic respiratory failure, unspecified whether with hypoxia or hypercapnia: Secondary | ICD-10-CM

## 2016-06-24 DIAGNOSIS — G629 Polyneuropathy, unspecified: Secondary | ICD-10-CM | POA: Diagnosis present

## 2016-06-24 DIAGNOSIS — J189 Pneumonia, unspecified organism: Secondary | ICD-10-CM | POA: Diagnosis present

## 2016-06-24 DIAGNOSIS — J9621 Acute and chronic respiratory failure with hypoxia: Secondary | ICD-10-CM | POA: Diagnosis present

## 2016-06-24 DIAGNOSIS — E785 Hyperlipidemia, unspecified: Secondary | ICD-10-CM | POA: Diagnosis present

## 2016-06-24 DIAGNOSIS — R0602 Shortness of breath: Secondary | ICD-10-CM | POA: Diagnosis not present

## 2016-06-24 DIAGNOSIS — R262 Difficulty in walking, not elsewhere classified: Secondary | ICD-10-CM

## 2016-06-24 DIAGNOSIS — J440 Chronic obstructive pulmonary disease with acute lower respiratory infection: Secondary | ICD-10-CM | POA: Diagnosis present

## 2016-06-24 DIAGNOSIS — I2510 Atherosclerotic heart disease of native coronary artery without angina pectoris: Secondary | ICD-10-CM | POA: Diagnosis present

## 2016-06-24 DIAGNOSIS — Z9981 Dependence on supplemental oxygen: Secondary | ICD-10-CM

## 2016-06-24 DIAGNOSIS — I481 Persistent atrial fibrillation: Secondary | ICD-10-CM | POA: Diagnosis present

## 2016-06-24 DIAGNOSIS — G4733 Obstructive sleep apnea (adult) (pediatric): Secondary | ICD-10-CM | POA: Diagnosis present

## 2016-06-24 DIAGNOSIS — Z825 Family history of asthma and other chronic lower respiratory diseases: Secondary | ICD-10-CM

## 2016-06-24 DIAGNOSIS — Z87891 Personal history of nicotine dependence: Secondary | ICD-10-CM

## 2016-06-24 DIAGNOSIS — G47419 Narcolepsy without cataplexy: Secondary | ICD-10-CM | POA: Diagnosis present

## 2016-06-24 DIAGNOSIS — Y95 Nosocomial condition: Secondary | ICD-10-CM | POA: Diagnosis present

## 2016-06-24 DIAGNOSIS — E1165 Type 2 diabetes mellitus with hyperglycemia: Secondary | ICD-10-CM | POA: Diagnosis present

## 2016-06-24 DIAGNOSIS — Z96651 Presence of right artificial knee joint: Secondary | ICD-10-CM | POA: Diagnosis present

## 2016-06-24 DIAGNOSIS — Z955 Presence of coronary angioplasty implant and graft: Secondary | ICD-10-CM

## 2016-06-24 DIAGNOSIS — J84112 Idiopathic pulmonary fibrosis: Secondary | ICD-10-CM | POA: Diagnosis present

## 2016-06-24 DIAGNOSIS — Z820 Family history of epilepsy and other diseases of the nervous system: Secondary | ICD-10-CM

## 2016-06-24 DIAGNOSIS — Z6841 Body Mass Index (BMI) 40.0 and over, adult: Secondary | ICD-10-CM

## 2016-06-24 LAB — CBC WITH DIFFERENTIAL/PLATELET
BASOS ABS: 0 10*3/uL (ref 0–0.1)
BASOS PCT: 1 %
EOS PCT: 0 %
Eosinophils Absolute: 0 10*3/uL (ref 0–0.7)
HCT: 35.6 % — ABNORMAL LOW (ref 40.0–52.0)
Hemoglobin: 11.9 g/dL — ABNORMAL LOW (ref 13.0–18.0)
LYMPHS PCT: 15 %
Lymphs Abs: 0.9 10*3/uL — ABNORMAL LOW (ref 1.0–3.6)
MCH: 31.3 pg (ref 26.0–34.0)
MCHC: 33.5 g/dL (ref 32.0–36.0)
MCV: 93.3 fL (ref 80.0–100.0)
MONO ABS: 0.7 10*3/uL (ref 0.2–1.0)
Monocytes Relative: 13 %
NEUTROS ABS: 4.3 10*3/uL (ref 1.4–6.5)
Neutrophils Relative %: 71 %
PLATELETS: 127 10*3/uL — AB (ref 150–440)
RBC: 3.81 MIL/uL — AB (ref 4.40–5.90)
RDW: 15.4 % — ABNORMAL HIGH (ref 11.5–14.5)
WBC: 6 10*3/uL (ref 3.8–10.6)

## 2016-06-24 LAB — COMPREHENSIVE METABOLIC PANEL
ALBUMIN: 2.9 g/dL — AB (ref 3.5–5.0)
ALT: 19 U/L (ref 17–63)
AST: 34 U/L (ref 15–41)
Alkaline Phosphatase: 71 U/L (ref 38–126)
Anion gap: 5 (ref 5–15)
BUN: 20 mg/dL (ref 6–20)
CO2: 36 mmol/L — ABNORMAL HIGH (ref 22–32)
CREATININE: 0.64 mg/dL (ref 0.61–1.24)
Calcium: 9.4 mg/dL (ref 8.9–10.3)
Chloride: 95 mmol/L — ABNORMAL LOW (ref 101–111)
GFR calc Af Amer: >60 mL/min (ref >60)
GFR calc non Af Amer: >60 mL/min (ref >60)
GLUCOSE: 146 mg/dL — AB (ref 65–99)
POTASSIUM: 4.3 mmol/L (ref 3.5–5.1)
Sodium: 136 mmol/L (ref 135–145)
Total Bilirubin: 0.8 mg/dL (ref 0.3–1.2)
Total Protein: 6.6 g/dL (ref 6.5–8.1)

## 2016-06-24 LAB — TSH: TSH: 2.45 u[IU]/mL (ref 0.350–4.500)

## 2016-06-24 LAB — TROPONIN I: Troponin I: <0.03 ng/mL (ref <0.03)

## 2016-06-24 LAB — GLUCOSE, CAPILLARY: Glucose-Capillary: 117 mg/dL — ABNORMAL HIGH (ref 65–99)

## 2016-06-24 LAB — MRSA PCR SCREENING: MRSA by PCR: NEGATIVE

## 2016-06-24 LAB — LACTIC ACID, PLASMA: Lactic Acid, Venous: 0.7 mmol/L (ref 0.5–1.9)

## 2016-06-24 MED ORDER — AMPHETAMINE-DEXTROAMPHET ER 5 MG PO CP24
15.0000 mg | ORAL_CAPSULE | Freq: Three times a day (TID) | ORAL | Status: DC
  Administered 2016-06-24 – 2016-06-27 (×10): 15 mg via ORAL
  Filled 2016-06-24 (×10): qty 3

## 2016-06-24 MED ORDER — ACETAMINOPHEN 325 MG PO TABS: 650.0000 mg | ORAL_TABLET | Freq: Four times a day (QID) | ORAL | Status: DC | PRN

## 2016-06-24 MED ORDER — NITROGLYCERIN 0.4 MG SL SUBL: 0.4000 mg | SUBLINGUAL_TABLET | SUBLINGUAL | Status: DC | PRN

## 2016-06-24 MED ORDER — ONDANSETRON HCL 4 MG/2ML IJ SOLN: 4.0000 mg | Freq: Four times a day (QID) | INTRAMUSCULAR | Status: DC | PRN

## 2016-06-24 MED ORDER — ISOSORBIDE MONONITRATE ER 60 MG PO TB24
30.0000 mg | ORAL_TABLET | Freq: Every day | ORAL | Status: DC
Start: 2016-06-24 — End: 2016-06-27
  Administered 2016-06-24 – 2016-06-27 (×4): 30 mg via ORAL
  Filled 2016-06-24 (×4): qty 1

## 2016-06-24 MED ORDER — CINNAMON 500 MG PO CAPS: 500.0000 mg | ORAL_CAPSULE | Freq: Every day | ORAL | Status: DC

## 2016-06-24 MED ORDER — METOPROLOL SUCCINATE ER 25 MG PO TB24
25.0000 mg | ORAL_TABLET | Freq: Every day | ORAL | Status: DC
  Administered 2016-06-24 – 2016-06-27 (×4): 25 mg via ORAL
  Filled 2016-06-24 (×4): qty 1

## 2016-06-24 MED ORDER — TART CHERRY ADVANCED PO CAPS: 1.0000 | ORAL_CAPSULE | Freq: Every day | ORAL | Status: DC

## 2016-06-24 MED ORDER — CLOPIDOGREL BISULFATE 75 MG PO TABS
75.0000 mg | ORAL_TABLET | Freq: Every day | ORAL | Status: DC
  Administered 2016-06-24 – 2016-06-27 (×4): 75 mg via ORAL
  Filled 2016-06-24 (×4): qty 1

## 2016-06-24 MED ORDER — ROSUVASTATIN CALCIUM 20 MG PO TABS
20.0000 mg | ORAL_TABLET | Freq: Every day | ORAL | Status: DC
  Administered 2016-06-24 – 2016-06-27 (×4): 20 mg via ORAL
  Filled 2016-06-24 (×4): qty 1

## 2016-06-24 MED ORDER — PRASUGREL HCL 10 MG PO TABS: 10.0000 mg | ORAL_TABLET | Freq: Every day | ORAL | Status: DC

## 2016-06-24 MED ORDER — ALBUTEROL SULFATE (2.5 MG/3ML) 0.083% IN NEBU
5.0000 mg | INHALATION_SOLUTION | Freq: Once | RESPIRATORY_TRACT | Status: AC
  Administered 2016-06-24: 5 mg via RESPIRATORY_TRACT
  Filled 2016-06-24: qty 6

## 2016-06-24 MED ORDER — ONDANSETRON HCL 4 MG PO TABS: 4.0000 mg | ORAL_TABLET | Freq: Four times a day (QID) | ORAL | Status: DC | PRN

## 2016-06-24 MED ORDER — IOPAMIDOL (ISOVUE-370) INJECTION 76%
100.0000 mL | Freq: Once | INTRAVENOUS | Status: AC | PRN
  Administered 2016-06-24: 100 mL via INTRAVENOUS

## 2016-06-24 MED ORDER — DOCUSATE SODIUM 100 MG PO CAPS
100.0000 mg | ORAL_CAPSULE | Freq: Two times a day (BID) | ORAL | Status: DC
  Administered 2016-06-24 – 2016-06-27 (×7): 100 mg via ORAL
  Filled 2016-06-24 (×7): qty 1

## 2016-06-24 MED ORDER — MODAFINIL 100 MG PO TABS: 200.0000 mg | ORAL_TABLET | ORAL | Status: DC | PRN

## 2016-06-24 MED ORDER — GUAIFENESIN ER 600 MG PO TB12: 600.0000 mg | ORAL_TABLET | Freq: Two times a day (BID) | ORAL | Status: DC | PRN

## 2016-06-24 MED ORDER — NIACIN ER (ANTIHYPERLIPIDEMIC) 500 MG PO TBCR
1000.0000 mg | EXTENDED_RELEASE_TABLET | Freq: Every day | ORAL | Status: DC
  Administered 2016-06-24 – 2016-06-26 (×3): 1000 mg via ORAL
  Filled 2016-06-24 (×7): qty 2

## 2016-06-24 MED ORDER — OMEGA-3-ACID ETHYL ESTERS 1 G PO CAPS
ORAL_CAPSULE | Freq: Every day | ORAL | Status: DC
  Administered 2016-06-24 – 2016-06-27 (×4): 1 g via ORAL
  Filled 2016-06-24 (×4): qty 1

## 2016-06-24 MED ORDER — COENZYME Q10 30 MG PO CAPS: 200.0000 mg | ORAL_CAPSULE | Freq: Two times a day (BID) | ORAL | Status: DC

## 2016-06-24 MED ORDER — GABAPENTIN 300 MG PO CAPS
300.0000 mg | ORAL_CAPSULE | Freq: Three times a day (TID) | ORAL | Status: DC
  Administered 2016-06-24 – 2016-06-27 (×10): 300 mg via ORAL
  Filled 2016-06-24 (×10): qty 1

## 2016-06-24 MED ORDER — INSULIN ASPART 100 UNIT/ML ~~LOC~~ SOLN
0.0000 [IU] | Freq: Three times a day (TID) | SUBCUTANEOUS | Status: DC
  Administered 2016-06-26 (×2): 2 [IU] via SUBCUTANEOUS
  Administered 2016-06-26: 3 [IU] via SUBCUTANEOUS
  Filled 2016-06-24: qty 3
  Filled 2016-06-24 (×2): qty 2

## 2016-06-24 MED ORDER — VANCOMYCIN HCL IN DEXTROSE 1-5 GM/200ML-% IV SOLN
1000.0000 mg | Freq: Once | INTRAVENOUS | Status: AC
  Administered 2016-06-24: 1000 mg via INTRAVENOUS
  Filled 2016-06-24: qty 200

## 2016-06-24 MED ORDER — TIOTROPIUM BROMIDE MONOHYDRATE 18 MCG IN CAPS
18.0000 ug | ORAL_CAPSULE | Freq: Every day | RESPIRATORY_TRACT | Status: DC
  Filled 2016-06-24: qty 5

## 2016-06-24 MED ORDER — CEFEPIME-DEXTROSE 2 GM/50ML IV SOLR
2.0000 g | Freq: Once | INTRAVENOUS | Status: AC
  Administered 2016-06-24: 2 g via INTRAVENOUS
  Filled 2016-06-24: qty 50

## 2016-06-24 MED ORDER — ACETAMINOPHEN 650 MG RE SUPP: 650.0000 mg | Freq: Four times a day (QID) | RECTAL | Status: DC | PRN

## 2016-06-24 MED ORDER — VANCOMYCIN HCL 10 G IV SOLR
1500.0000 mg | Freq: Three times a day (TID) | INTRAVENOUS | Status: DC
  Filled 2016-06-24: qty 1500

## 2016-06-24 MED ORDER — INSULIN ASPART 100 UNIT/ML ~~LOC~~ SOLN: 0.0000 [IU] | Freq: Every day | SUBCUTANEOUS | Status: DC

## 2016-06-24 MED ORDER — CEFEPIME-DEXTROSE 2 GM/50ML IV SOLR
2.0000 g | Freq: Three times a day (TID) | INTRAVENOUS | Status: DC
  Administered 2016-06-24 – 2016-06-26 (×6): 2 g via INTRAVENOUS
  Filled 2016-06-24 (×8): qty 50

## 2016-06-24 MED ORDER — GINKGO BILOBA 60 MG PO TABS: 60.0000 mg | ORAL_TABLET | ORAL | Status: DC

## 2016-06-24 NOTE — Progress Notes (Signed)
[place] [place] NAME: Miguel Palmer    MR#:  [id number]  DATE OF BIRTH:  07/17/1940  SUBJECTIVE:  CHIEF COMPLAINT:  Patient's shortness of breath is better. Reports hemoptysis and some cough   REVIEW OF SYSTEMS:  CONSTITUTIONAL: No fever, fatigue or weakness.  EYES: No blurred or double vision.  EARS, NOSE, AND THROAT: No tinnitus or ear pain.  RESPIRATORY: Some dry  cough,  denies shortness of breath, wheezing. Reporting  hemoptysis.  CARDIOVASCULAR: No chest pain, orthopnea, edema.  GASTROINTESTINAL: No nausea, vomiting, diarrhea or abdominal pain.  GENITOURINARY: No dysuria, hematuria.  ENDOCRINE: No polyuria, nocturia,  HEMATOLOGY: No anemia, easy bruising or bleeding SKIN: No rash or lesion. MUSCULOSKELETAL: No joint pain or arthritis.   NEUROLOGIC: No tingling, numbness, weakness.  PSYCHIATRY: No anxiety or depression.   DRUG ALLERGIES:   Allergies  Allergen Reactions  . Pollen Extract Shortness Of Breath    Post nasal drip  . Other Other (See Comments)    pepper  . Morphine And Related Itching    (+) itching and redness noted to IV insertion site following administration. See ED nurses note from 11/25 at 0449.    VITALS:  Blood pressure 131/79, pulse 93, temperature 98.4 F (36.9 C), temperature source Oral, resp. rate 18, height 6' 3"  (1.905 m), weight (!) 154.5 kg (340 lb 9.6 oz), SpO2 95 %.  PHYSICAL EXAMINATION:  GENERAL:  76 y.o.-year-old patient lying in the bed with no acute distress.  EYES: Pupils equal, round, reactive to light and accommodation. No scleral icterus. Extraocular muscles intact.  HEENT: Head atraumatic, normocephalic. Oropharynx and nasopharynx clear.  NECK:  Supple, no jugular venous distention. No thyroid enlargement, no tenderness.  LUNGS: Mod breath sounds bilaterally, no wheezing, rales,rhonchi or crepitation. No use of accessory muscles of respiration.  CARDIOVASCULAR: S1, S2  normal. No murmurs, rubs, or gallops.  ABDOMEN: Soft, nontender, nondistended. Bowel sounds present. No organomegaly or mass.  EXTREMITIES: No pedal edema, cyanosis, or clubbing.  NEUROLOGIC: Cranial nerves II through XII are intact. Muscle strength 5/5 in all extremities. Sensation intact. Gait not checked.  PSYCHIATRIC: The patient is alert and oriented x 3.  SKIN: No obvious rash, lesion, or ulcer.    LABORATORY PANEL:   CBC  Recent Labs Lab 06/24/16 0052  WBC 6.0  HGB 11.9*  HCT 35.6*  PLT 127*   ------------------------------------------------------------------------------------------------------------------  Chemistries   Recent Labs Lab 06/24/16 0052  NA 136  K 4.3  CL 95*  CO2 36*  GLUCOSE 146*  BUN 20  CREATININE 0.64  CALCIUM 9.4  AST 34  ALT 19  ALKPHOS 71  BILITOT 0.8   ------------------------------------------------------------------------------------------------------------------  Cardiac Enzymes  Recent Labs Lab 06/24/16 0052  TROPONINI <0.03   ------------------------------------------------------------------------------------------------------------------  RADIOLOGY:  Dg Chest 2 View  Result Date: 06/24/2016 CLINICAL DATA:  Shortness of breath, hemoptysis beginning yesterday morning. Hypoxia. Recent diagnosis of pneumonia. History of COPD, coronary artery stenting. EXAM: CHEST  2 VIEW COMPARISON:  Chest radiograph May 17, 2016 FINDINGS: Cardiac silhouette is mildly enlarged unchanged. Mediastinal silhouette is nonsuspicious. Mild diffuse interstitial prominence. Patchy RIGHT mid lung zone airspace opacity. Chronic bibasilar coarsened interstitium. No pneumothorax. Soft tissue planes and included osseous structures are nonsuspicious. IMPRESSION: Patchy RIGHT midlung zone airspace opacity concerning for pneumonia. Followup PA and lateral chest X-ray is recommended in 3-4 weeks following trial of antibiotic therapy to ensure resolution and  exclude underlying malignancy. Mild cardiomegaly, chronic interstitial changes with probable  bibasilar fibrosis. Electronically Signed   By: Elon Alas M.D.   On: 06/24/2016 01:20   Ct Angio Chest Pe W/cm &/or Wo Cm  Result Date: 06/24/2016 CLINICAL DATA:  Shortness of breath, hemoptysis beginning at 10 a.m. yesterday. Hypoxia. Recent diagnosis of pneumonia. History of COPD. EXAM: CT ANGIOGRAPHY CHEST WITH CONTRAST TECHNIQUE: Multidetector CT imaging of the chest was performed using the standard protocol during bolus administration of intravenous contrast. Multiplanar CT image reconstructions and MIPs were obtained to evaluate the vascular anatomy. CONTRAST:  100 cc Isovue 370 COMPARISON:  Chest radiograph June 24, 2016 at 0049 hours and CT chest March 12, 2015 FINDINGS: CARDIOVASCULAR: Adequate contrast opacification of the pulmonary artery's. Main pulmonary artery is enlarged at 3.8 cm. No pulmonary arterial filling defects to the level of the subsegmental branches given mild respiratory motion. Heart size is mildly enlarged, no right heart strain. No pericardial effusions. Coronary artery calcifications versus stents. Thoracic aorta is normal course and caliber, unremarkable. MEDIASTINUM/NODES: 8 mm RIGHT paratracheal lymph node, 9 mm aortopulmonary window lymph night, 12 mm carinal lymph node and 13 mm subcarinal lymph node. No mediastinal mass. LUNGS/PLEURA: Tracheobronchial tree is patent, no pneumothorax. Heterogeneous lung attenuation, scattered ground-glass opacities and nodules, RIGHT greater than LEFT dense consolidation bilateral upper lobes, RIGHT middle lobe and to lesser extent lingula. Small bilateral pleural effusions. Coarsened pulmonary interstitium in the lung bases similar to prior CT. UPPER ABDOMEN: Included view of the abdomen is unremarkable. MUSCULOSKELETAL: Visualized soft tissues and included osseous structures are nonacute. subcentimeter LEFT supraclavicular lymph node.  Review of the MIP images confirms the above findings. IMPRESSION: Multifocal pneumonia.  Small pleural effusions. Mild cardiomegaly. Severe coronary artery calcifications and/or stents. Electronically Signed   By: Elon Alas M.D.   On: 06/24/2016 02:13    EKG:   Orders placed or performed during the hospital encounter of 06/24/16  . EKG 12-Lead  . EKG 12-Lead  . ED EKG  . ED EKG    ASSESSMENT AND PLAN:   This is a 76 year old male with chronic lung disease admitted for healthcare associated pneumonia.  1. Pneumonia: Healthcare associated;   on cefepime and vancomycin. Neg MRSA PCR. DC vancomycin   Supplemental oxygen per home regimen.  2. Sepsis: The patient meets criteria via tachycardia, tachypnea and leukocytosisAt the time of admission Services healthcare associated pneumonia  hemodynamically stable.  Follow blood cultures for growth and sensitivities.  3. Coronary artery disease: Stable; continue Plavix and Imdur. Discontinue patient's home medication Effient as patient cannot afford it  4. Atrial fibrillation: New diagnosis as of a few weeks ago. Rate controlled F/u with [facility] cardiology.  5. Diabetes mellitus type II: Hold oral hyperglycemic agents. Sliding scale insulin while hospitalized. Gabapentin for neuropathy.  6. COPD: Continue Spiriva  7. Dyslipidemia: Continue statin therapy and niacin  8. Narcolepsy: Continue Provigil and Adderall  9. DVT prophylaxis: SCDs     All the records are reviewed and case discussed with Care Management/Social Workerr. Management plans discussed with the patient, family and they are in agreement.  CODE STATUS: fc   TOTAL TIME TAKING CARE OF THIS PATIENT: 36 minutes.   POSSIBLE D/C IN 2  DAYS, DEPENDING ON CLINICAL CONDITION.  Note: This dictation was prepared with Dragon dictation along with smaller phrase technology. Any transcriptional errors that result from this process are unintentional.   Nicholes Mango M.D on  06/24/2016 at 2:57 PM  Between 7am to 6pm - Pager - [phone] After 6pm go to [email or web address] - password  [facility]  [facility]  Office  [phone]  CC: Primary care physician; [name], MD

## 2016-06-24 NOTE — Progress Notes (Signed)
Pharmacy Antibiotic Note  Miguel Palmer is a 76 y.o. male admitted on 06/24/2016 with pneumonia/HCAP.  Patient has taken Levofloxacin, ceftriaxone, azithromycin and Augmentin in past two months for PNA  Pharmacy has been consulted for vancomycin and cefepime dosing. Patient received 1 dose Vancomycin IV 1gm and Cefepime 2gm IV in ED.   Plan: Ke: 0.104   T1/2: 6.7   Vd: 79.1   DW: 113Kg  Will start the patient on Vancomycin 1535m IV every 8 hours with 6 hours stack dosing. Calculated trough at Css =16. Trough level prior to 5th dose. Recommend D/C of vancomycin if MRSA PCR is negative.   Will start cefepime 2gm IV every 8 hours.   Pharmacy will monitor renal function and adjust medication doses as needed.    Height: 6' 3"  (190.5 cm) Weight: (!) 340 lb (154.2 kg) IBW/kg (Calculated) : 84.5  Temp (24hrs), Avg:98.1 F (36.7 C), Min:98.1 F (36.7 C), Max:98.1 F (36.7 C)   Recent Labs Lab 06/24/16 0052 06/24/16 0124  WBC 6.0  --   CREATININE 0.64  --   LATICACIDVEN  --  0.7    Estimated Creatinine Clearance: 124.9 mL/min (by C-G formula based on SCr of 0.64 mg/dL).    Allergies  Allergen Reactions  . Pollen Extract Shortness Of Breath    Post nasal drip  . Other Other (See Comments)    pepper  . Morphine And Related Itching    (+) itching and redness noted to IV insertion site following administration. See ED nurses note from 11/25 at 0449.    Antimicrobials this admission: 1/2 cefepime >>  1/2 vancomycin >>   Dose adjustments this admission:   Microbiology results: 1/2 BCx: pending 1/2 MRSA PCR: pending  Thank you for allowing pharmacy to be a part of this patient's care.  SPernell Dupre PharmD, BCPS Clinical Pharmacist 06/24/2016 3:03 AM

## 2016-06-24 NOTE — Care Management Obs Status (Signed)
[facility] NOTIFICATION   Patient Details  Name: Miguel Palmer MRN: [id number] Date of Birth: 1941-06-09   Medicare Observation Status Notification Given:  Yes    Shelbie Ammons, RN 06/24/2016, 3:44 PM

## 2016-06-24 NOTE — Progress Notes (Signed)
Patient refuses bed alarm, pt educated on reason for bed alarm.

## 2016-06-24 NOTE — H&P (Signed)
Miguel Palmer is an 76 y.o. male.    Chief Complaint: Shortness of breath HPI: The patient with past medical history of pulmonary fibrosis and diabetes presents emergency department complaining of shortness of breath. This is acute on chronic. The patient has had a productive cough intermittently for months. He states that the sputum ranges from "green cottage cheese" to various shades of red area most recently he had some scant hemoptysis that was bright red but he states sputum is more frequently "plum-colored". He is on chronic oxygen therapy and becomes dyspneic just walking across a room or even while showering if he is not breathing through his nose. CT of the chest in the emergency department revealed multifocal pneumonia. Sepsis protocol was initiated and the hospitalist service was contacted for further management.  Past Medical History:  Diagnosis Date  . Arteriosclerosis    7 stents placed no s/s x 7 years  . Biceps tendinitis 05/29/2014  . COPD (chronic obstructive pulmonary disease) ([place])   . Diabetes mellitus without complication ([place])   . Disorder of rotator cuff 05/29/2014  . Edema    bilateral lower extremities  . Frank hematuria 04/28/2013  . Memory change 01/13/2014  . Narcolepsy   . Obesity   . 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 Oxygen deficiency     Past Surgical History:  Procedure Laterality Date  . CORONARY STENT PLACEMENT    . KNEE ARTHROSCOPY Right     Family History  Problem Relation Age of Onset  . Dementia Mother   . Asthma Father   . Emphysema Father   . Alzheimer's disease Father    Social History:  reports that he has quit smoking. His smoking use included Cigarettes. He has a 44.00 pack-year smoking history. He has never used smokeless tobacco. He reports that he does not drink alcohol or use drugs.  Allergies:  Allergies  Allergen Reactions  . Pollen Extract Shortness Of Breath    Post nasal drip  . Other Other (See Comments)     pepper  . Morphine And Related Itching    (+) itching and redness noted to IV insertion site following administration. See ED nurses note from 11/25 at 0449.    Medications Prior to Admission  Medication Sig Dispense Refill  . amoxicillin-clavulanate (AUGMENTIN) 875-125 MG tablet Take 1 tablet by mouth 2 (two) times daily.  0  . Cinnamon 500 MG capsule Take 500 mg by mouth daily.     . clopidogrel (PLAVIX) 75 MG tablet Take 1 tablet by mouth daily.  11  . co-enzyme Q-10 30 MG capsule Take 200 mg by mouth 2 (two) times daily.     . [employer] OMEGA-3 KRILL OIL 300 MG CAPS Take 2 capsules by mouth daily.    Marland Kitchen dextroamphetamine (DEXEDRINE SPANSULE) 15 MG 24 hr capsule Take 1 capsule by mouth 4 (four) times daily.  0  . gabapentin (NEURONTIN) 300 MG capsule Take 1 capsule by mouth 3 (three) times daily.  0  . Ginkgo Biloba 60 MG TABS Take 60 mg by mouth as directed.     . Glucosamine Sulfate 1000 MG CAPS Take by mouth daily.     Marland Kitchen guaiFENesin (MUCINEX) 600 MG 12 hr tablet Take 600 mg by mouth 2 (two) times daily as needed.     . isosorbide mononitrate (IMDUR) 30 MG 24 hr tablet TAKE 1 TABLET BY MOUTH EVERY DAY    . metFORMIN (GLUCOPHAGE) 500 MG tablet TAKE 1 TABLET BY MOUTH DAILY.    Marland Kitchen  metoprolol succinate (TOPROL-XL) 25 MG 24 hr tablet TAKE 1 TABLET BY MOUTH EVERY DAY    . Misc Natural Products (TART CHERRY ADVANCED) CAPS Take 1 capsule by mouth daily.    . modafinil (PROVIGIL) 200 MG tablet three times daily    . Multiple Vitamins-Minerals (MULTIVITAMIN & MINERAL PO) Take by mouth.    . niacin (NIASPAN) 1000 MG CR tablet Take 1,000 mg by mouth at bedtime.     . nitroGLYCERIN (NITROSTAT) 0.4 MG SL tablet Place under the tongue.    . NON FORMULARY Take 400 mg by mouth daily.    . NON FORMULARY Take 1 Dose by mouth as needed. Takes 3 doses every week    . prasugrel (EFFIENT) 10 MG TABS tablet TAKE 1 TABLET BY MOUTH EVERY DAY    . PROAIR HFA 108 (90 Base) MCG/ACT inhaler Inhale 2 puffs into the lungs  4 (four) times daily as needed.  0  . rosuvastatin (CRESTOR) 20 MG tablet Take 20 mg by mouth daily.     Marland Kitchen tiotropium (SPIRIVA) 18 MCG inhalation capsule Place 18 mcg into inhaler and inhale daily.    Marland Kitchen UNABLE TO FIND Take 60 mg by mouth daily. Med Name: PHOSATIDYL SERINE  TAKE 1 TAB BY MOUTH EVERY DAY    . FLUZONE HIGH-DOSE 0.5 ML SUSY TO BE ADMINISTERED BY PHARMACIST FOR IMMUNIZATION  0    Results for orders placed or performed during the hospital encounter of 06/24/16 (from the past 48 hour(s))  CBC with Differential     Status: Abnormal   Collection Time: 06/24/16 12:52 AM  Result Value Ref Range   WBC 6.0 3.8 - 10.6 K/uL   RBC 3.81 (L) 4.40 - 5.90 MIL/uL   Hemoglobin 11.9 (L) 13.0 - 18.0 g/dL   HCT 35.6 (L) 40.0 - 52.0 %   MCV 93.3 80.0 - 100.0 fL   MCH 31.3 26.0 - 34.0 pg   MCHC 33.5 32.0 - 36.0 g/dL   RDW 15.4 (H) 11.5 - 14.5 %   Platelets 127 (L) 150 - 440 K/uL   Neutrophils Relative % 71 %   Neutro Abs 4.3 1.4 - 6.5 K/uL   Lymphocytes Relative 15 %   Lymphs Abs 0.9 (L) 1.0 - 3.6 K/uL   Monocytes Relative 13 %   Monocytes Absolute 0.7 0.2 - 1.0 K/uL   Eosinophils Relative 0 %   Eosinophils Absolute 0.0 0 - 0.7 K/uL   Basophils Relative 1 %   Basophils Absolute 0.0 0 - 0.1 K/uL  Comprehensive metabolic panel     Status: Abnormal   Collection Time: 06/24/16 12:52 AM  Result Value Ref Range   Sodium 136 135 - 145 mmol/L   Potassium 4.3 3.5 - 5.1 mmol/L   Chloride 95 (L) 101 - 111 mmol/L   CO2 36 (H) 22 - 32 mmol/L   Glucose, Bld 146 (H) 65 - 99 mg/dL   BUN 20 6 - 20 mg/dL   Creatinine, Ser 0.64 0.61 - 1.24 mg/dL   Calcium 9.4 8.9 - 10.3 mg/dL   Total Protein 6.6 6.5 - 8.1 g/dL   Albumin 2.9 (L) 3.5 - 5.0 g/dL   AST 34 15 - 41 U/L   ALT 19 17 - 63 U/L   Alkaline Phosphatase 71 38 - 126 U/L   Total Bilirubin 0.8 0.3 - 1.2 mg/dL   GFR calc non Af Amer >60 >60 mL/min   GFR calc Af Amer >60 >60 mL/min    Comment: (NOTE)  The eGFR has been calculated using the CKD EPI  equation. This calculation has not been validated in all clinical situations. eGFR's persistently <60 mL/min signify possible Chronic Kidney Disease.    Anion gap 5 5 - 15  Troponin I     Status: None   Collection Time: 06/24/16 12:52 AM  Result Value Ref Range   Troponin I <0.03 <0.03 ng/mL  TSH     Status: None   Collection Time: 06/24/16 12:52 AM  Result Value Ref Range   TSH 2.450 0.350 - 4.500 uIU/mL    Comment: Performed by a 3rd Generation assay with a functional sensitivity of <=0.01 uIU/mL.  Lactic acid, plasma     Status: None   Collection Time: 06/24/16  1:24 AM  Result Value Ref Range   Lactic Acid, Venous 0.7 0.5 - 1.9 mmol/L  Culture, blood (routine x 2)     Status: None (Preliminary result)   Collection Time: 06/24/16  2:08 AM  Result Value Ref Range   Specimen Description BLOOD RH    Special Requests      BOTTLES DRAWN AEROBIC AND ANAEROBIC AER 12ML ANA 11ML   Culture NO GROWTH < 12 HOURS    Report Status PENDING   Culture, blood (routine x 2)     Status: None (Preliminary result)   Collection Time: 06/24/16  2:08 AM  Result Value Ref Range   Specimen Description BLOOD L AC    Special Requests      BOTTLES DRAWN AEROBIC AND ANAEROBIC AER 7ML ANA 8ML   Culture NO GROWTH < 12 HOURS    Report Status PENDING   MRSA PCR Screening     Status: None   Collection Time: 06/24/16  5:08 AM  Result Value Ref Range   MRSA by PCR NEGATIVE NEGATIVE    Comment:        The GeneXpert MRSA Assay (FDA approved for NASAL specimens only), is one component of a comprehensive MRSA colonization surveillance program. It is not intended to diagnose MRSA infection nor to guide or monitor treatment for MRSA infections.    Dg Chest 2 View  Result Date: 06/24/2016 CLINICAL DATA:  Shortness of breath, hemoptysis beginning yesterday morning. Hypoxia. Recent diagnosis of pneumonia. History of COPD, coronary artery stenting. EXAM: CHEST  2 VIEW COMPARISON:  Chest radiograph May 17, 2016 FINDINGS: Cardiac silhouette is mildly enlarged unchanged. Mediastinal silhouette is nonsuspicious. Mild diffuse interstitial prominence. Patchy RIGHT mid lung zone airspace opacity. Chronic bibasilar coarsened interstitium. No pneumothorax. Soft tissue planes and included osseous structures are nonsuspicious. IMPRESSION: Patchy RIGHT midlung zone airspace opacity concerning for pneumonia. Followup PA and lateral chest X-ray is recommended in 3-4 weeks following trial of antibiotic therapy to ensure resolution and exclude underlying malignancy. Mild cardiomegaly, chronic interstitial changes with probable bibasilar fibrosis. Electronically Signed   By: Elon Alas M.D.   On: 06/24/2016 01:20   Ct Angio Chest Pe W/cm &/or Wo Cm  Result Date: 06/24/2016 CLINICAL DATA:  Shortness of breath, hemoptysis beginning at 10 a.m. yesterday. Hypoxia. Recent diagnosis of pneumonia. History of COPD. EXAM: CT ANGIOGRAPHY CHEST WITH CONTRAST TECHNIQUE: Multidetector CT imaging of the chest was performed using the standard protocol during bolus administration of intravenous contrast. Multiplanar CT image reconstructions and MIPs were obtained to evaluate the vascular anatomy. CONTRAST:  100 cc Isovue 370 COMPARISON:  Chest radiograph June 24, 2016 at 0049 hours and CT chest March 12, 2015 FINDINGS: CARDIOVASCULAR: Adequate contrast opacification of the pulmonary artery's. Main  pulmonary artery is enlarged at 3.8 cm. No pulmonary arterial filling defects to the level of the subsegmental branches given mild respiratory motion. Heart size is mildly enlarged, no right heart strain. No pericardial effusions. Coronary artery calcifications versus stents. Thoracic aorta is normal course and caliber, unremarkable. MEDIASTINUM/NODES: 8 mm RIGHT paratracheal lymph node, 9 mm aortopulmonary window lymph night, 12 mm carinal lymph node and 13 mm subcarinal lymph node. No mediastinal mass. LUNGS/PLEURA: Tracheobronchial  tree is patent, no pneumothorax. Heterogeneous lung attenuation, scattered ground-glass opacities and nodules, RIGHT greater than LEFT dense consolidation bilateral upper lobes, RIGHT middle lobe and to lesser extent lingula. Small bilateral pleural effusions. Coarsened pulmonary interstitium in the lung bases similar to prior CT. UPPER ABDOMEN: Included view of the abdomen is unremarkable. MUSCULOSKELETAL: Visualized soft tissues and included osseous structures are nonacute. subcentimeter LEFT supraclavicular lymph node. Review of the MIP images confirms the above findings. IMPRESSION: Multifocal pneumonia.  Small pleural effusions. Mild cardiomegaly. Severe coronary artery calcifications and/or stents. Electronically Signed   By: Elon Alas M.D.   On: 06/24/2016 02:13    Review of Systems  Constitutional: Negative for chills and fever.  HENT: Negative for sore throat and tinnitus.   Eyes: Negative for blurred vision and redness.  Respiratory: Positive for cough and shortness of breath (chronic).   Cardiovascular: Negative for chest pain, palpitations, orthopnea and PND.  Gastrointestinal: Negative for abdominal pain, diarrhea, nausea and vomiting.  Genitourinary: Negative for dysuria, frequency and urgency.  Musculoskeletal: Negative for joint pain and myalgias.  Skin: Negative for rash.       No lesions  Neurological: Negative for speech change, focal weakness and weakness.  Endo/Heme/Allergies: Does not bruise/bleed easily.       No temperature intolerance  Psychiatric/Behavioral: Negative for depression and suicidal ideas.    Blood pressure 138/78, pulse 89, temperature 97.4 F (36.3 C), temperature source Oral, resp. rate 20, height _0  (1.905 m), weight (!) 154.5 kg (340 lb 9.6 oz), SpO2 94 %. Physical Exam  Constitutional: He is oriented to person, place, and time. He appears well-developed and well-nourished. No distress.  HENT:  Head: Normocephalic and atraumatic.   Mouth/Throat: Oropharynx is clear and moist.  Eyes: Conjunctivae and EOM are normal. Pupils are equal, round, and reactive to light. No scleral icterus.  Neck: Normal range of motion. Neck supple. No JVD present. No tracheal deviation present. No thyromegaly present.  Cardiovascular: Normal rate, regular rhythm and normal heart sounds.  Exam reveals no gallop and no friction rub.   No murmur heard. Respiratory: Effort normal and breath sounds normal. No respiratory distress.  GI: Soft. Bowel sounds are normal. He exhibits no distension. There is no tenderness.  Genitourinary:  Genitourinary Comments: Deferred  Musculoskeletal: Normal range of motion. He exhibits edema.  Lymphadenopathy:    He has no cervical adenopathy.  Neurological: He is alert and oriented to person, place, and time. No cranial nerve deficit.  Skin: Skin is warm and dry. No rash noted. 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 His behavior is normal. Judgment and thought content normal.     Assessment/Plan This is a 76 year old male with chronic lung disease admitted for healthcare associated pneumonia. 1. Pneumonia: Healthcare associated; started on cefepime and vancomycin. Check MRSA PCR. DC vancomycin if negative. Supplemental oxygen per home regimen. 2. Sepsis: The patient meets criteria via tachycardia, tachypnea and leukocytosis. His hemodynamically stable. Follow blood cultures for growth and  sensitivities. 3. Coronary artery disease: Stable; continue Plavix and Imdur.  4. Atrial fibrillation: New diagnosis as of a few weeks ago. It is unclear why the patient is not on full anticoagulation. Check records from [facility] cardiology. 5. Diabetes mellitus type II: Hold oral hyperglycemic agents. Sliding scale insulin while hospitalized. Gabapentin for neuropathy. 6. COPD: Continue Spiriva 7. Dyslipidemia: Continue statin therapy and niacin 8.  Narcolepsy: Continue Provigil and Adderall 9. DVT prophylaxis: SCDs 10. GI prophylaxis: None and the patient is a full code. Time spent on admission orders and patient care approximately 45 minutes  Harrie Foreman, MD 06/24/2016, 7:40 AM

## 2016-06-24 NOTE — ED Notes (Signed)
Patient transported to CT 

## 2016-06-24 NOTE — ED Notes (Signed)
Had conversation with CT, this patients IV is above his wrist and they will evaluate when they come to get him.

## 2016-06-24 NOTE — ED Notes (Signed)
Patient returned from CT

## 2016-06-24 NOTE — ED Provider Notes (Signed)
[facility] Emergency Department Provider Note   ____________________________________________   First MD Initiated Contact with Patient 06/24/16 [phone]     (approximate)  I have reviewed the triage vital signs and the nursing notes.   HISTORY  Chief Complaint Shortness of Breath    HPI Miguel Palmer is a 76 y.o. male who presents to the ED from home via EMS with a chief complaint of shortness of breath. Patient has a history ofpulmonary fibrosis, reactive airways disease, COPD on continuous oxygen at 4 L who reports shortness of breath and generalized weakness since 10 AM yesterday. Patient has had 2 hospital admissions for community-acquired pneumonia within the last 6 weeks. Initially he was placed on Levaquin; last hospitalization he was discharged with Augmentin. Saw his PCP last week who extended the Augmentin course to almost 3 weeks. Complains of similar symptoms as when he had pneumonia at the previous 2 times: Fatigue, generalized weakness, decreased by mouth appetite, cough productive of bloody sputum. Denies chest pain, abdominal pain, nausea, vomiting, diarrhea. Denies recent travel trauma. Patient does not take anticoagulants.   Past Medical History:  Diagnosis Date  . Arteriosclerosis    7 stents placed no s/s x 7 years  . Biceps tendinitis 05/29/2014  . COPD (chronic obstructive pulmonary disease) ([place])   . Diabetes mellitus without complication ([place])   . Disorder of rotator cuff 05/29/2014  . Edema    bilateral lower extremities  . Frank hematuria 04/28/2013  . Memory change 01/13/2014  . Narcolepsy   . Obesity   . 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 Oxygen deficiency     Patient Active Problem List   Diagnosis Date Noted  . Community acquired pneumonia 05/17/2016  . Chest pain 05/17/2016  . Diabetes mellitus without complication ([place]) 99/24/2683  . Chronic bilateral low back pain with right-sided sciatica 05/01/2016  .  Long term current use of anticoagulant therapy 05/01/2016  . Chronic pain of right lower extremity 05/01/2016  . Spinal stenosis of lumbar region 05/01/2016  . Other spondylosis with radiculopathy, lumbar region 05/01/2016  . Spondylolisthesis of lumbar region 05/01/2016  . Chronic pain syndrome 05/01/2016  . Hypoxemia requiring supplemental oxygen 12/27/2015  . Morbid obesity with BMI of 40.0-44.9, adult ([place]) 11/22/2015  . Chronic respiratory failure with hypoxia ([place]) 09/05/2015  . Pulmonary fibrosis, unspecified ([place]) 09/05/2015  . Restrictive airway disease 06/20/2015  . Chronic obstructive pulmonary disease ([place]) 04/01/2015  . Mixed hyperlipidemia 01/05/2015  . Non-ST elevation myocardial infarction (NSTEMI), subendocardial infarction, subsequent episode of care ([place]) 01/05/2015  . Benign essential hypertension 01/04/2015  . HLD (hyperlipidemia) 10/31/2014  . Heart attack 10/31/2014  . Difficulty hearing 03/17/2014  . Abnormal gait 10/09/2013  . Obstructive apnea 08/30/2012  . 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 Prostate cancer screening 09/23/2011  . Vitamin D deficiency 09/23/2011  . Arteriosclerosis of coronary artery 09/04/2011  . CAD (coronary artery disease) 09/04/2011  . Allergic rhinitis 06/25/2011  . 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 Lower extremity weakness 03/30/2011  . Neuropathy ([place]) 01/17/2011    Past Surgical History:  Procedure Laterality Date  . CORONARY STENT PLACEMENT    . KNEE ARTHROSCOPY Right     Prior to Admission medications   Medication Sig Start Date End Date Taking? Authorizing Provider  amphetamine-dextroamphetamine (ADDERALL XR) 15 MG 24 hr capsule Take 15 mg by mouth 3 (three) times daily.    Historical Provider, MD  Cinnamon 500 MG capsule Take 500 mg by mouth daily.  Historical Provider, MD  co-enzyme Q-10 30 MG capsule Take 200 mg by mouth 2 (two) times daily.     Historical Provider, MD  [facility] OMEGA-3 KRILL OIL 300 MG CAPS Take 2 capsules  by mouth daily.    Historical Provider, MD  FLUZONE HIGH-DOSE 0.5 ML SUSY TO BE ADMINISTERED BY PHARMACIST FOR IMMUNIZATION 04/04/16   Historical Provider, MD  gabapentin (NEURONTIN) 400 MG capsule TAKE 2 CAPSULES, 1 400 mg and 1 300 mg  (700MG TOTAL) BY MOUTH 4 (FOUR) TIMES DAILY. 02/29/16   Historical Provider, MD  Ginkgo Biloba 60 MG TABS Take 60 mg by mouth as directed.     Historical Provider, MD  Glucosamine Sulfate 1000 MG CAPS Take by mouth daily.     Historical Provider, MD  guaiFENesin (MUCINEX) 600 MG 12 hr tablet Take 600 mg by mouth 2 (two) times daily as needed.     Historical Provider, MD  isosorbide mononitrate (IMDUR) 30 MG 24 hr tablet TAKE 1 TABLET BY MOUTH EVERY DAY 05/22/14   Historical Provider, MD  levofloxacin (LEVAQUIN) 750 MG tablet Take 1 tablet (750 mg total) by mouth daily. 05/19/16   Bettey Costa, MD  metFORMIN (GLUCOPHAGE) 500 MG tablet TAKE 1 TABLET BY MOUTH DAILY. 10/17/14   Historical Provider, MD  metoprolol succinate (TOPROL-XL) 25 MG 24 hr tablet TAKE 1 TABLET BY MOUTH EVERY DAY 08/11/14   Historical Provider, MD  Misc Natural Products (TART CHERRY ADVANCED) CAPS Take 1 capsule by mouth daily.    Historical Provider, MD  modafinil (PROVIGIL) 200 MG tablet three times daily 03/26/16   Historical Provider, MD  Multiple Vitamins-Minerals (MULTIVITAMIN & MINERAL PO) Take by mouth.    Historical Provider, MD  niacin (NIASPAN) 1000 MG CR tablet Take 1,000 mg by mouth at bedtime.  10/03/14   Historical Provider, MD  nitroGLYCERIN (NITROSTAT) 0.4 MG SL tablet Place under the tongue. 10/09/13   Historical Provider, MD  NON FORMULARY Take 400 mg by mouth daily.    Historical Provider, MD  NON FORMULARY Take 1 Dose by mouth as needed. Takes 3 doses every week    Historical Provider, MD  prasugrel (EFFIENT) 10 MG TABS tablet TAKE 1 TABLET BY MOUTH EVERY DAY 05/22/14   Historical Provider, MD  rosuvastatin (CRESTOR) 20 MG tablet Take 20 mg by mouth daily.  10/02/14   Historical  Provider, MD  tiotropium (SPIRIVA) 18 MCG inhalation capsule Place 18 mcg into inhaler and inhale daily.    Historical Provider, MD  UNABLE TO FIND Take 60 mg by mouth daily. Med Name: PHOSATIDYL SERINE  TAKE 1 TAB BY MOUTH EVERY DAY    Historical Provider, MD    Allergies Pollen extract; Other; and Morphine and related  Family History  Problem Relation Age of Onset  . Dementia Mother   . Asthma Father   . Emphysema Father   . Alzheimer's disease Father     Social History Social History  Substance Use Topics  . Smoking status: Former Smoker    Packs/day: 2.00    Years: 22.00    Types: Cigarettes  . Smokeless tobacco: Former Systems developer    Quit date: 09/03/1980  . Alcohol use No    Review of Systems  Constitutional: Positive for fatigue and generalized weakness. No fever/chills. Eyes: No visual changes. ENT: No sore throat. Cardiovascular: Denies chest pain. Respiratory: Positive for hemoptysis and shortness of breath. Gastrointestinal: No abdominal pain.  No nausea, no vomiting.  No diarrhea.  No constipation. Genitourinary: Negative for  dysuria. Musculoskeletal: Negative for back pain. Skin: Negative for rash. Neurological: Negative for headaches, focal weakness or numbness.  10-point ROS otherwise negative.  ____________________________________________   PHYSICAL EXAM:  VITAL SIGNS: ED Triage Vitals  Enc Vitals Group     BP 06/24/16 0036 (!) 142/95     Pulse Rate 06/24/16 0036 (!) 111     Resp 06/24/16 0036 15     Temp 06/24/16 0036 98.1 F (36.7 C)     Temp Source 06/24/16 0036 Oral     SpO2 06/24/16 0036 94 %     Weight 06/24/16 0038 (!) 340 lb (154.2 kg)     Height 06/24/16 0038 6' 3"  (1.905 m)     Head Circumference --      Peak Flow --      Pain Score --      Pain Loc --      Pain Edu? --      Excl. in [place]? --     Constitutional: Alert and oriented. Chronically ill appearing and in mild acute distress. Eyes: Conjunctivae are normal. PERRL.  EOMI. Head: Atraumatic. Nose: No congestion/rhinnorhea. Mouth/Throat: Mucous membranes are moist.  Oropharynx non-erythematous. Neck: No stridor.   Cardiovascular: Normal rate, regular rhythm. Grossly normal heart sounds.  Good peripheral circulation. Respiratory: Normal respiratory effort.  No retractions. Lungs diminished bilaterally; scattered rhonchi right lung. Gastrointestinal: Soft and nontender. No distention. No abdominal bruits. No CVA tenderness. Musculoskeletal: No lower extremity tenderness. 1+ pitting BLE edema.  No joint effusions. Neurologic:  Normal speech and language. No gross focal neurologic deficits are appreciated.  Skin:  Skin is warm, dry and intact. No rash noted. Psychiatric: Mood and affect are normal. Speech and behavior are normal.  ____________________________________________   LABS (all labs ordered are listed, but only abnormal results are displayed)  Labs Reviewed  CBC WITH DIFFERENTIAL/PLATELET - Abnormal; Notable for the following:       Result Value   RBC 3.81 (*)    Hemoglobin 11.9 (*)    HCT 35.6 (*)    RDW 15.4 (*)    Platelets 127 (*)    Lymphs Abs 0.9 (*)    All other components within normal limits  COMPREHENSIVE METABOLIC PANEL - Abnormal; Notable for the following:    Chloride 95 (*)    CO2 36 (*)    Glucose, Bld 146 (*)    Albumin 2.9 (*)    All other components within normal limits  CULTURE, BLOOD (ROUTINE X 2)  CULTURE, BLOOD (ROUTINE X 2)  TROPONIN I  LACTIC ACID, PLASMA  LACTIC ACID, PLASMA   ____________________________________________  EKG  ED ECG REPORT I, Kennita Pavlovich J, the attending physician, personally viewed and interpreted this ECG.   Date: 06/24/2016  EKG Time: 0040  Rate: 104  Rhythm: atrial fibrillation, rate 104  Axis: LAD  Intervals:left anterior fascicular block  ST&T Change: Nonspecific  ____________________________________________  RADIOLOGY  Chest 2 view (viewed by me, interpreted per Dr.  Dorann Lodge):  Patchy RIGHT midlung zone airspace opacity concerning for pneumonia.  Followup PA and lateral chest X-ray is recommended in 3-4 weeks  following trial of antibiotic therapy to ensure resolution and  exclude underlying malignancy.    Mild cardiomegaly, chronic interstitial changes with probable  bibasilar fibrosis.   CT chest interpreted per Dr. Dorann Lodge:  Multifocal pneumonia. Small pleural effusions.    Mild cardiomegaly. Severe coronary artery calcifications and/or  stents.   ____________________________________________   PROCEDURES  Procedure(s) performed: None  Procedures  Critical Care  performed: No  ____________________________________________   INITIAL IMPRESSION / ASSESSMENT AND PLAN / ED COURSE  Pertinent labs & imaging results that were available during my care of the patient were reviewed by me and considered in my medical decision making (see chart for details).  76 year old male with pulmonary fibrosis and COPD on continuous oximetry and therapy who presents with shortness of breath, cough and hemoptysis. This is in the setting of 2 recent hospitalizations for community-acquired pneumonia and patient currently on a 3 week course of Augmentin. Will obtain screening lab work, chest x-ray and consider CT chest. Will administer DuoNeb for diminished lungs.  Clinical Course as of Jun 24 228  Tue Jun 24, 2016  0227 Updated patient and spouse of CT imaging results. Placed on cefepime and vancomycin for healthcare acquired pneumonia. Discussed with hospitalist to evaluate patient in the emergency department for admission.  [JS]    Clinical Course User Index [JS] Paulette Blanch, MD     ____________________________________________   FINAL CLINICAL IMPRESSION(S) / ED DIAGNOSES  Final diagnoses:  HCAP (healthcare-associated pneumonia)  SOB (shortness of breath)  Acute on chronic respiratory failure, unspecified whether with hypoxia or hypercapnia  ([place])      NEW MEDICATIONS STARTED DURING THIS VISIT:  New Prescriptions   No medications on file     Note:  This document was prepared using Dragon voice recognition software and may include unintentional dictation errors.    Paulette Blanch, MD 06/24/16 [phone]

## 2016-06-24 NOTE — Plan of Care (Signed)
Problem: Safety: Goal: Ability to remain free from injury will improve Outcome: Not Progressing Patient refusing bed alarm and safety precautions.

## 2016-06-24 NOTE — Progress Notes (Signed)
PHARMACIST - PHYSICIAN ORDER COMMUNICATION  CONCERNING: P&T Medication Policy on Herbal Medications  DESCRIPTION:  This patient's orders for:  TART CHERRY ADVANCED CAPS, Ginkgo Biloba TABS 60 mg, co-enzyme Q-10 capsule 210 mg, co-enzyme Q-10 capsule 210 mg has been noted.  These product(s) are classified as an "herbal" or natural product. Due to a lack of definitive safety studies or FDA approval, nonstandard manufacturing practices, plus the potential risk of unknown drug-drug interactions while on inpatient medications, the Pharmacy and Therapeutics Committee does not permit the use of "herbal" or natural products of this type within [facility].   ACTION TAKEN: The pharmacy department is unable to verify this order at this time and your patient has been informed of this safety policy. Please reevaluate patient's clinical condition at discharge and address if the herbal or natural product(s) should be resumed at that time.  Pernell Dupre, PharmD, BCPS Clinical Pharmacist 06/24/2016 5:03 AM

## 2016-06-24 NOTE — ED Triage Notes (Signed)
Pt. To ED from home by [facility]. Per [facility]: pt. Reports SOB and decreased O2 from baseline associated with coughing up blood since 1000 yesterday. Home O2 3L, maintaining 94% on 4L upon arrival to department. Pt. States these are the same sx hes had with recent PNA dx. EMS reports lungs clear in route.

## 2016-06-24 NOTE — ED Notes (Signed)
ED Provider at bedside. 

## 2016-06-25 ENCOUNTER — Ambulatory Visit: Payer: Medicare Other

## 2016-06-25 DIAGNOSIS — Z96651 Presence of right artificial knee joint: Secondary | ICD-10-CM | POA: Diagnosis present

## 2016-06-25 DIAGNOSIS — G47419 Narcolepsy without cataplexy: Secondary | ICD-10-CM | POA: Diagnosis present

## 2016-06-25 DIAGNOSIS — Z79899 Other long term (current) drug therapy: Secondary | ICD-10-CM | POA: Diagnosis not present

## 2016-06-25 DIAGNOSIS — I2510 Atherosclerotic heart disease of native coronary artery without angina pectoris: Secondary | ICD-10-CM | POA: Diagnosis present

## 2016-06-25 DIAGNOSIS — Z6841 Body Mass Index (BMI) 40.0 and over, adult: Secondary | ICD-10-CM | POA: Diagnosis not present

## 2016-06-25 DIAGNOSIS — G629 Polyneuropathy, unspecified: Secondary | ICD-10-CM | POA: Diagnosis present

## 2016-06-25 DIAGNOSIS — I481 Persistent atrial fibrillation: Secondary | ICD-10-CM | POA: Diagnosis present

## 2016-06-25 DIAGNOSIS — J8410 Pulmonary fibrosis, unspecified: Secondary | ICD-10-CM | POA: Diagnosis not present

## 2016-06-25 DIAGNOSIS — Z825 Family history of asthma and other chronic lower respiratory diseases: Secondary | ICD-10-CM | POA: Diagnosis not present

## 2016-06-25 DIAGNOSIS — J9621 Acute and chronic respiratory failure with hypoxia: Secondary | ICD-10-CM | POA: Diagnosis present

## 2016-06-25 DIAGNOSIS — E785 Hyperlipidemia, unspecified: Secondary | ICD-10-CM | POA: Diagnosis present

## 2016-06-25 DIAGNOSIS — Z9981 Dependence on supplemental oxygen: Secondary | ICD-10-CM | POA: Diagnosis not present

## 2016-06-25 DIAGNOSIS — A419 Sepsis, unspecified organism: Secondary | ICD-10-CM | POA: Diagnosis present

## 2016-06-25 DIAGNOSIS — R0602 Shortness of breath: Secondary | ICD-10-CM | POA: Diagnosis present

## 2016-06-25 DIAGNOSIS — E1165 Type 2 diabetes mellitus with hyperglycemia: Secondary | ICD-10-CM | POA: Diagnosis present

## 2016-06-25 DIAGNOSIS — Z87891 Personal history of nicotine dependence: Secondary | ICD-10-CM | POA: Diagnosis not present

## 2016-06-25 DIAGNOSIS — Z7984 Long term (current) use of oral hypoglycemic drugs: Secondary | ICD-10-CM | POA: Diagnosis not present

## 2016-06-25 DIAGNOSIS — J440 Chronic obstructive pulmonary disease with acute lower respiratory infection: Secondary | ICD-10-CM | POA: Diagnosis present

## 2016-06-25 DIAGNOSIS — Z955 Presence of coronary angioplasty implant and graft: Secondary | ICD-10-CM | POA: Diagnosis not present

## 2016-06-25 DIAGNOSIS — J84112 Idiopathic pulmonary fibrosis: Secondary | ICD-10-CM | POA: Diagnosis present

## 2016-06-25 DIAGNOSIS — Z820 Family history of epilepsy and other diseases of the nervous system: Secondary | ICD-10-CM | POA: Diagnosis not present

## 2016-06-25 DIAGNOSIS — J441 Chronic obstructive pulmonary disease with (acute) exacerbation: Secondary | ICD-10-CM | POA: Diagnosis not present

## 2016-06-25 DIAGNOSIS — E1140 Type 2 diabetes mellitus with diabetic neuropathy, unspecified: Secondary | ICD-10-CM | POA: Diagnosis present

## 2016-06-25 DIAGNOSIS — Y95 Nosocomial condition: Secondary | ICD-10-CM | POA: Diagnosis present

## 2016-06-25 DIAGNOSIS — E662 Morbid (severe) obesity with alveolar hypoventilation: Secondary | ICD-10-CM | POA: Diagnosis not present

## 2016-06-25 DIAGNOSIS — G4733 Obstructive sleep apnea (adult) (pediatric): Secondary | ICD-10-CM | POA: Diagnosis present

## 2016-06-25 DIAGNOSIS — J189 Pneumonia, unspecified organism: Secondary | ICD-10-CM | POA: Diagnosis present

## 2016-06-25 LAB — GLUCOSE, CAPILLARY
Glucose-Capillary: 117 mg/dL — ABNORMAL HIGH (ref 65–99)
Glucose-Capillary: 98 mg/dL (ref 65–99)
Glucose-Capillary: 110 mg/dL — ABNORMAL HIGH (ref 65–99)
Glucose-Capillary: 149 mg/dL — ABNORMAL HIGH (ref 65–99)

## 2016-06-25 LAB — BASIC METABOLIC PANEL
Anion gap: 4 — ABNORMAL LOW (ref 5–15)
BUN: 13 mg/dL (ref 6–20)
CO2: 37 mmol/L — ABNORMAL HIGH (ref 22–32)
CREATININE: 0.69 mg/dL (ref 0.61–1.24)
Calcium: 9.2 mg/dL (ref 8.9–10.3)
Chloride: 96 mmol/L — ABNORMAL LOW (ref 101–111)
GFR calc Af Amer: >60 mL/min (ref >60)
GFR calc non Af Amer: >60 mL/min (ref >60)
GLUCOSE: 118 mg/dL — AB (ref 65–99)
POTASSIUM: 4.5 mmol/L (ref 3.5–5.1)
Sodium: 137 mmol/L (ref 135–145)

## 2016-06-25 LAB — CBC
HCT: 36.1 % — ABNORMAL LOW (ref 40.0–52.0)
Hemoglobin: 12 g/dL — ABNORMAL LOW (ref 13.0–18.0)
MCH: 31.4 pg (ref 26.0–34.0)
MCHC: 33.2 g/dL (ref 32.0–36.0)
MCV: 94.8 fL (ref 80.0–100.0)
PLATELETS: 133 10*3/uL — AB (ref 150–440)
RBC: 3.81 MIL/uL — ABNORMAL LOW (ref 4.40–5.90)
RDW: 15.4 % — AB (ref 11.5–14.5)
WBC: 5.6 10*3/uL (ref 3.8–10.6)

## 2016-06-25 MED ORDER — METHYLPREDNISOLONE SODIUM SUCC 40 MG IJ SOLR
40.0000 mg | Freq: Two times a day (BID) | INTRAMUSCULAR | Status: DC
  Administered 2016-06-25 – 2016-06-26 (×3): 40 mg via INTRAVENOUS
  Filled 2016-06-25 (×3): qty 1

## 2016-06-25 NOTE — Care Management (Signed)
Admitted to this facility with the diagnosis of pneumonia under observation status. Lives with wife, Raquel Sarna, [phone]). Last seen Dr. Horatio Pel PA 2 weeks ago. Home oxygen per [facility] x 2 years. States he uses his oxygen 2-3 liters per nasal cannula  continuous. No skilled facility. Home Health per [facility] now. [place], wheelchair, bath tub sheet, and canes in the home. [facility] [place] [facility]. Last fall about a month ago. Good appetite. Wife helps with baths and dressing. Self feeds. Wife will transport home.  Shelbie Ammons RN MSN CCM Care Management

## 2016-06-25 NOTE — Progress Notes (Addendum)
[place] [facility] [place] NAME: Miguel Palmer    MR#:  [id number]  DATE OF BIRTH:  09-24-1940  SUBJECTIVE:  CHIEF COMPLAINT:  Patient's shortness of breath is stll present. Reports hemoptysis is better. Patient is concerned about his pulmonary fibrosis  REVIEW OF SYSTEMS:  CONSTITUTIONAL: No fever, fatigue or weakness.  EYES: No blurred or double vision.  EARS, NOSE, AND THROAT: No tinnitus or ear pain.  RESPIRATORY: Some dry  cough,  denies shortness of breath, wheezing. Reporting  hemoptysis.  CARDIOVASCULAR: No chest pain, orthopnea, edema.  GASTROINTESTINAL: No nausea, vomiting, diarrhea or abdominal pain.  GENITOURINARY: No dysuria, hematuria.  ENDOCRINE: No polyuria, nocturia,  HEMATOLOGY: No anemia, easy bruising or bleeding SKIN: No rash or lesion. MUSCULOSKELETAL: No joint pain or arthritis.   NEUROLOGIC: No tingling, numbness, weakness.  PSYCHIATRY: No anxiety or depression.   DRUG ALLERGIES:   Allergies  Allergen Reactions  . Pollen Extract Shortness Of Breath    Post nasal drip  . Other Other (See Comments)    pepper  . Morphine And Related Itching    (+) itching and redness noted to IV insertion site following administration. See ED nurses note from 11/25 at 0449.    VITALS:  Blood pressure 117/80, pulse 99, temperature 98.8 F (37.1 C), temperature source Oral, resp. rate 20, height 6' 3"  (1.905 m), weight (!) 153.5 kg (338 lb 4.8 oz), SpO2 93 %.  PHYSICAL EXAMINATION:  GENERAL:  76 y.o.-year-old patient lying in the bed with no acute distress.  EYES: Pupils equal, round, reactive to light and accommodation. No scleral icterus. Extraocular muscles intact.  HEENT: Head atraumatic, normocephalic. Oropharynx and nasopharynx clear.  NECK:  Supple, no jugular venous distention. No thyroid enlargement, no tenderness.  LUNGS: Mod breath sounds bilaterally, no wheezing, rales,rhonchi or crepitation. No use of accessory  muscles of respiration.  CARDIOVASCULAR: S1, S2 normal. No murmurs, rubs, or gallops.  ABDOMEN: Soft, nontender, nondistended. Bowel sounds present. No organomegaly or mass.  EXTREMITIES: No pedal edema, cyanosis, or clubbing.  NEUROLOGIC: Cranial nerves II through XII are intact. Muscle strength 5/5 in all extremities. Sensation intact. Gait not checked.  PSYCHIATRIC: The patient is alert and oriented x 3.  SKIN: No obvious rash, lesion, or ulcer.    LABORATORY PANEL:   CBC  Recent Labs Lab 06/25/16 0543  WBC 5.6  HGB 12.0*  HCT 36.1*  PLT 133*   ------------------------------------------------------------------------------------------------------------------  Chemistries   Recent Labs Lab 06/24/16 0052 06/25/16 0543  NA 136 137  K 4.3 4.5  CL 95* 96*  CO2 36* 37*  GLUCOSE 146* 118*  BUN 20 13  CREATININE 0.64 0.69  CALCIUM 9.4 9.2  AST 34  --   ALT 19  --   ALKPHOS 71  --   BILITOT 0.8  --    ------------------------------------------------------------------------------------------------------------------  Cardiac Enzymes  Recent Labs Lab 06/24/16 0052  TROPONINI <0.03   ------------------------------------------------------------------------------------------------------------------  RADIOLOGY:  Dg Chest 2 View  Result Date: 06/24/2016 CLINICAL DATA:  Shortness of breath, hemoptysis beginning yesterday morning. Hypoxia. Recent diagnosis of pneumonia. History of COPD, coronary artery stenting. EXAM: CHEST  2 VIEW COMPARISON:  Chest radiograph May 17, 2016 FINDINGS: Cardiac silhouette is mildly enlarged unchanged. Mediastinal silhouette is nonsuspicious. Mild diffuse interstitial prominence. Patchy RIGHT mid lung zone airspace opacity. Chronic bibasilar coarsened interstitium. No pneumothorax. Soft tissue planes and included osseous structures are nonsuspicious. IMPRESSION: Patchy RIGHT midlung zone airspace opacity concerning for pneumonia. Followup PA  and lateral chest X-ray is recommended in 3-4 weeks following trial of antibiotic therapy to ensure resolution and exclude underlying malignancy. Mild cardiomegaly, chronic interstitial changes with probable bibasilar fibrosis. Electronically Signed   By: Elon Alas M.D.   On: 06/24/2016 01:20   Ct Angio Chest Pe W/cm &/or Wo Cm  Result Date: 06/24/2016 CLINICAL DATA:  Shortness of breath, hemoptysis beginning at 10 a.m. yesterday. Hypoxia. Recent diagnosis of pneumonia. History of COPD. EXAM: CT ANGIOGRAPHY CHEST WITH CONTRAST TECHNIQUE: Multidetector CT imaging of the chest was performed using the standard protocol during bolus administration of intravenous contrast. Multiplanar CT image reconstructions and MIPs were obtained to evaluate the vascular anatomy. CONTRAST:  100 cc Isovue 370 COMPARISON:  Chest radiograph June 24, 2016 at 0049 hours and CT chest March 12, 2015 FINDINGS: CARDIOVASCULAR: Adequate contrast opacification of the pulmonary artery's. Main pulmonary artery is enlarged at 3.8 cm. No pulmonary arterial filling defects to the level of the subsegmental branches given mild respiratory motion. Heart size is mildly enlarged, no right heart strain. No pericardial effusions. Coronary artery calcifications versus stents. Thoracic aorta is normal course and caliber, unremarkable. MEDIASTINUM/NODES: 8 mm RIGHT paratracheal lymph node, 9 mm aortopulmonary window lymph night, 12 mm carinal lymph node and 13 mm subcarinal lymph node. No mediastinal mass. LUNGS/PLEURA: Tracheobronchial tree is patent, no pneumothorax. Heterogeneous lung attenuation, scattered ground-glass opacities and nodules, RIGHT greater than LEFT dense consolidation bilateral upper lobes, RIGHT middle lobe and to lesser extent lingula. Small bilateral pleural effusions. Coarsened pulmonary interstitium in the lung bases similar to prior CT. UPPER ABDOMEN: Included view of the abdomen is unremarkable. MUSCULOSKELETAL:  Visualized soft tissues and included osseous structures are nonacute. subcentimeter LEFT supraclavicular lymph node. Review of the MIP images confirms the above findings. IMPRESSION: Multifocal pneumonia.  Small pleural effusions. Mild cardiomegaly. Severe coronary artery calcifications and/or stents. Electronically Signed   By: Elon Alas M.D.   On: 06/24/2016 02:13    EKG:   Orders placed or performed during the hospital encounter of 06/24/16  . EKG 12-Lead  . EKG 12-Lead  . ED EKG  . ED EKG    ASSESSMENT AND PLAN:   This is a 76 year old male with chronic lung disease admitted for healthcare associated pneumonia.  1. Pneumonia: Healthcare associated underlying pulmonary fibrosis  on cefepime and vancomycin. Neg MRSA PCR. DC vancomycin   Supplemental oxygen per home regimen. Patient was seen by [facility] pulmonology in the past, records could not be retrieved Consult our pulmonology group IS Solu-Medrol is added for bronchial constriction  2. Sepsis: The patient meets criteria via tachycardia, tachypnea and leukocytosisAt the time of admission Services healthcare associated pneumonia  hemodynamically stable.  Follow blood cultures -2 are negative. MRSA PCR is negative. Discontinued vancomycin  3. Coronary artery disease: Stable; continue Plavix and Imdur. Discontinue patient's home medication Effient as patient cannot afford it  4. Atrial fibrillation: New diagnosis as of a few weeks ago. Rate controlled F/u with [facility] cardiology.  5. Diabetes mellitus type II: Hold oral hyperglycemic agents. Sliding scale insulin while hospitalized. Gabapentin for neuropathy.  6. COPD: Continue Spiriva  7. Dyslipidemia: Continue statin therapy and niacin  8. Narcolepsy: Continue Provigil and Adderall  9. DVT prophylaxis: SCDs  pT consult   All the records are reviewed and case discussed with Care Management/Social Workerr. Management plans discussed with the patient, family and  they are in agreement.  CODE STATUS: fc   TOTAL TIME TAKING CARE OF THIS PATIENT: 36 minutes.  POSSIBLE D/C IN 2  DAYS, DEPENDING ON CLINICAL CONDITION.  Note: This dictation was prepared with Dragon dictation along with smaller phrase technology. Any transcriptional errors that result from this process are unintentional.   Nicholes Mango M.D on 06/25/2016 at 3:09 PM  Between 7am to 6pm - Pager - [phone] After 6pm go to [email or web address] - password [email or web address] [facility]  [place] [facility]  Office  [phone]  CC: Primary care physician; [name], MD

## 2016-06-26 ENCOUNTER — Ambulatory Visit: Payer: Medicare Other

## 2016-06-26 LAB — GLUCOSE, CAPILLARY
Glucose-Capillary: 132 mg/dL — ABNORMAL HIGH (ref 65–99)
Glucose-Capillary: 151 mg/dL — ABNORMAL HIGH (ref 65–99)
Glucose-Capillary: 135 mg/dL — ABNORMAL HIGH (ref 65–99)
Glucose-Capillary: 112 mg/dL — ABNORMAL HIGH (ref 65–99)

## 2016-06-26 MED ORDER — AMOXICILLIN-POT CLAVULANATE 875-125 MG PO TABS
1.0000 | ORAL_TABLET | Freq: Two times a day (BID) | ORAL | Status: DC
  Administered 2016-06-26 – 2016-06-27 (×2): 1 via ORAL
  Filled 2016-06-26 (×2): qty 1

## 2016-06-26 NOTE — Progress Notes (Signed)
Family Meeting Note  Advance Directive:yes  Today a meeting took place with the Patient.and his wife     The following clinical team members were present during this meeting:MD  The following were discussed:Patient's diagnosis: , Patient's progosis: Unable to determine and Goals for treatment: Full Code, discussed about the diagnosis of pneumonia including pulmonary fibrosis and chronic need for oxygen via nasal cannula. Will consult pulmonology and physical therapy at this time. Not considering palliative care  Additional follow-up to be provided: Hospitalist team and pulmonology  Time spent during discussion:18 min  Kimani Bedoya, Illene Silver, MD

## 2016-06-26 NOTE — Progress Notes (Signed)
[facility] NAME: Miguel Palmer    MR#:  [id number]  DATE OF BIRTH:  1941/01/26  SUBJECTIVE:  CHIEF COMPLAINT:  Patient's Feeling better Reports hemoptysis is better. Patient is concerned about his pulmonary fibrosis and reporting weakness  REVIEW OF SYSTEMS:  CONSTITUTIONAL: No fever, fatigue or weakness.  EYES: No blurred or double vision.  EARS, NOSE, AND THROAT: No tinnitus or ear pain.  RESPIRATORY: Some dry  cough,  denies shortness of breath, wheezing. Reporting  hemoptysis.  CARDIOVASCULAR: No chest pain, orthopnea, edema.  GASTROINTESTINAL: No nausea, vomiting, diarrhea or abdominal pain.  GENITOURINARY: No dysuria, hematuria.  ENDOCRINE: No polyuria, nocturia,  HEMATOLOGY: No anemia, easy bruising or bleeding SKIN: No rash or lesion. MUSCULOSKELETAL: No joint pain or arthritis.   NEUROLOGIC: No tingling, numbness, weakness.  PSYCHIATRY: No anxiety or depression.   DRUG ALLERGIES:   Allergies  Allergen Reactions  . Pollen Extract Shortness Of Breath    Post nasal drip  . Other Other (See Comments)    pepper  . Morphine And Related Itching    (+) itching and redness noted to IV insertion site following administration. See ED nurses note from 11/25 at 0449.    VITALS:  Blood pressure 128/78, pulse (!) 105, temperature 97.6 F (36.4 C), temperature source Oral, resp. rate (!) 21, height 6' 3"  (1.905 m), weight (!) 153.8 kg (339 lb 0.4 oz), SpO2 91 %.  PHYSICAL EXAMINATION:  GENERAL:  76 y.o.-year-old patient lying in the bed with no acute distress.  EYES: Pupils equal, round, reactive to light and accommodation. No scleral icterus. Extraocular muscles intact.  HEENT: Head atraumatic, normocephalic. Oropharynx and nasopharynx clear.  NECK:  Supple, no jugular venous distention. No thyroid enlargement, no tenderness.  LUNGS: Mod breath sounds bilaterally, no wheezing, rales,rhonchi or crepitation. No use of  accessory muscles of respiration.  CARDIOVASCULAR: S1, S2 normal. No murmurs, rubs, or gallops.  ABDOMEN: Soft, nontender, nondistended. Bowel sounds present. No organomegaly or mass.  EXTREMITIES: No pedal edema, cyanosis, or clubbing.  NEUROLOGIC: Cranial nerves II through XII are intact. Muscle strength 5/5 in all extremities. Sensation intact. Gait not checked.  PSYCHIATRIC: The patient is alert and oriented x 3.  SKIN: No obvious rash, lesion, or ulcer.    LABORATORY PANEL:   CBC  Recent Labs Lab 06/25/16 0543  WBC 5.6  HGB 12.0*  HCT 36.1*  PLT 133*   ------------------------------------------------------------------------------------------------------------------  Chemistries   Recent Labs Lab 06/24/16 0052 06/25/16 0543  NA 136 137  K 4.3 4.5  CL 95* 96*  CO2 36* 37*  GLUCOSE 146* 118*  BUN 20 13  CREATININE 0.64 0.69  CALCIUM 9.4 9.2  AST 34  --   ALT 19  --   ALKPHOS 71  --   BILITOT 0.8  --    ------------------------------------------------------------------------------------------------------------------  Cardiac Enzymes  Recent Labs Lab 06/24/16 0052  TROPONINI <0.03   ------------------------------------------------------------------------------------------------------------------  RADIOLOGY:  No results found.  EKG:   Orders placed or performed during the hospital encounter of 06/24/16  . EKG 12-Lead  . EKG 12-Lead  . ED EKG  . ED EKG    ASSESSMENT AND PLAN:   This is a 76 year old male with chronic lung disease admitted for healthcare associated pneumonia.  1. Pneumonia: Healthcare associated underlying pulmonary fibrosis  on cefepime and vancomycin clinically improving , will change abx augmentin  Neg MRSA PCR. DC vancomycin   Supplemental oxygen per home regimen. Patient was  seen by [facility] pulmonology in the past, records could not be retrieved Consult our pulmonology group, refusing to see  dr.Flemming IS Solu-Medrol is  added for bronchial constriction,Clinically better. We will change to by mouth prednisone  2. Sepsis: The patient meets criteria via tachycardia, tachypnea and leukocytosisAt the time of admission Services healthcare associated pneumonia  hemodynamically stable.  Follow blood cultures -2 are negative. MRSA PCR is negative. Discontinued vancomycin  3. Coronary artery disease: Stable; continue Plavix and Imdur. Discontinue patient's home medication Effient as patient cannot afford it  4. Atrial fibrillation: New diagnosis as of a few weeks ago. Rate controlled F/u with [facility] cardiology.  5. Diabetes mellitus type II: Hold oral hyperglycemic agents. Sliding scale insulin while hospitalized. Gabapentin for neuropathy.  6. COPD: Continue Spiriva  7. Dyslipidemia: Continue statin therapy and niacin  8. Narcolepsy: Continue Provigil and Adderall  9. DVT prophylaxis: SCDs  pT consult   All the records are reviewed and case discussed with Care Management/Social Workerr. Management plans discussed with the patient, wife over phone and they are in agreement.  CODE STATUS: fc   TOTAL TIME TAKING CARE OF THIS PATIENT: 36 minutes.   POSSIBLE D/C IN 2  DAYS, DEPENDING ON CLINICAL CONDITION.  Note: This dictation was prepared with Dragon dictation along with smaller phrase technology. Any transcriptional errors that result from this process are unintentional.   Nicholes Mango M.D on 06/26/2016 at 3:29 PM  Between 7am to 6pm - Pager - [phone] After 6pm go to [email or web address] - password [email or web address] [facility]  [facility]  Office  [phone]  CC: Primary care physician; [name], MD

## 2016-06-26 NOTE — Progress Notes (Signed)
Rounding hospitalist needs to contact Dr. Raul Del to make him aware pt is requesting [facility] to be consulted on his case instead.  Marda Stalker, [place] Pager [phone] (please enter 7 digits) PCCM Consult Pager [phone] (please enter 7 digits)

## 2016-06-26 NOTE — Progress Notes (Addendum)
Date: 06/26/2016,   MRN# [id number] Miguel Palmer Jun 10, 1941 Code Status:     Code Status Orders        Start     Ordered   06/24/16 0452  Full code  Continuous     06/24/16 0451    Code Status History    Date Active Date Inactive Code Status Order ID Comments User Context   05/17/2016 10:01 AM 05/19/2016  5:52 PM Full Code [id number]  Saundra Shelling, MD Inpatient     Hosp day:@LENGTHOFSTAYDAYS @ Referring MD: @ATDPROV @         AdmissionWeight: (!) 340 lb (154.2 kg)                 CurrentWeight: (!) 339 lb 0.4 oz (153.8 kg)   CC: pneumonia, ? Underlying fibrosis  HPI: This is a 76 yr old male, overweight who came in with a cough, green sputum, shortness of breath, blood in sputum, with signs of sepsis (tachy, increase rr, increase wbc).  He was started on cefime and vanco. mrsa screen negative vanco d/ced. Now on augmentin. He also has copd, diabetes, obesity, cad, atrial fibrillation and narcolepsy on adderall and provigil. On review of his chart he has had multiple bouts of pneumonia.  The patient is concern about underlying pulmonary fibrosis. He was seen at [facility] for pulmonary care. Told to go to the ER for the blood in the sputum. He is on 5-6 liters  oxygen here.   PMHX:   Past Medical History:  Diagnosis Date  . Arteriosclerosis    7 stents placed no s/s x 7 years  . Biceps tendinitis 05/29/2014  . COPD (chronic obstructive pulmonary disease) ([place])   . Diabetes mellitus without complication ([place])   . Disorder of rotator cuff 05/29/2014  . Edema    bilateral lower extremities  . Frank hematuria 04/28/2013  . Memory change 01/13/2014  . Narcolepsy   . Obesity   . 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 Oxygen deficiency    Surgical Hx:  Past Surgical History:  Procedure Laterality Date  . CORONARY STENT PLACEMENT    . KNEE ARTHROSCOPY Right    Family Hx:  Family History  Problem Relation Age of Onset  . Dementia Mother   . Asthma Father   . Emphysema  Father   . Alzheimer's disease Father    Social Hx:   Social History  Substance Use Topics  . Smoking status: Former Smoker    Packs/day: 2.00    Years: 22.00    Types: Cigarettes  . Smokeless tobacco: Never Used  . Alcohol use No   Medication:    Home Medication:    Current Medication: @CURMEDTAB @   Allergies:  Pollen extract; Other; and Morphine and related  Review of Systems: Gen:  Denies  fever, sweats, chills HEENT: Denies blurred vision, double vision, ear pain, eye pain, hearing loss, nose bleeds, sore throat Cvc:  No dizziness, chest pain or heaviness Resp:  Cough dyspnea on exertion, less bloos in sptum.   Gi: Denies swallowing difficulty, stomach pain, nausea or vomiting, diarrhea, constipation, bowel incontinence Gu:  Denies bladder incontinence, burning urine Ext:   No Joint pain, stiffness or swelling Skin: No skin rash, easy bruising or bleeding or hives Endoc:  No polyuria, polydipsia , polyphagia or weight change Psych: No depression, insomnia or hallucinations  Other:  All other systems negative  Physical Examination:   VS: BP 128/78 (BP Location: Left Arm)   Pulse (!) 105  Temp 97.6 F (36.4 C) (Oral)   Resp (!) 21   Ht 6' 3"  (1.905 m)   Wt (!) 339 lb 0.4 oz (153.8 kg)   SpO2 91%   BMI 42.38 kg/m   General Appearance: No distress, large male  Neuro: without focal findings, mental status, speech normal, alert and oriented, cranial nerves 2-12 intact, reflexes normal and symmetric, sensation grossly normal  HEENT: PERRLA, EOM intact, no ptosis, no other lesions noticed, Pulmonary:.No wheezing, No rales  Sputum Production:   Cardiovascular:  Normal S1,S2.  No m/r/g.  Abdominal aorta pulsation normal.    Abdomen:Benign, Soft, non-tender, No masses, hepatosplenomegaly, No lymphadenopathy Endoc: No evident thyromegaly, no signs of acromegaly or Cushing features Skin:   warm, no rashes, no ecchymosis  Extremities: normal, no cyanosis, clubbing, + ve  edema, chronic trophic changes Other findings:   Labs results:   Recent Labs     06/24/16  0052  06/25/16  0543  HGB  11.9*  12.0*  HCT  35.6*  36.1*  MCV  93.3  94.8  WBC  6.0  5.6  BUN  20  13  CREATININE  0.64  0.69  GLUCOSE  146*  118*  CALCIUM  9.4  9.2  ,    Rad results:  CLINICAL DATA:  Shortness of breath, hemoptysis beginning at 10 a.m. yesterday. Hypoxia. Recent diagnosis of pneumonia. History of COPD.  EXAM: CT ANGIOGRAPHY CHEST WITH CONTRAST  TECHNIQUE: Multidetector CT imaging of the chest was performed using the standard protocol during bolus administration of intravenous contrast. Multiplanar CT image reconstructions and MIPs were obtained to evaluate the vascular anatomy.  CONTRAST:  100 cc Isovue 370  COMPARISON:  Chest radiograph June 24, 2016 at 0049 hours and CT chest March 12, 2015  FINDINGS: CARDIOVASCULAR: Adequate contrast opacification of the pulmonary artery's. Main pulmonary artery is enlarged at 3.8 cm. No pulmonary arterial filling defects to the level of the subsegmental branches given mild respiratory motion. Heart size is mildly enlarged, no right heart strain. No pericardial effusions. Coronary artery calcifications versus stents. Thoracic aorta is normal course and caliber, unremarkable.  MEDIASTINUM/NODES: 8 mm RIGHT paratracheal lymph node, 9 mm aortopulmonary window lymph night, 12 mm carinal lymph node and 13 mm subcarinal lymph node. No mediastinal mass.  LUNGS/PLEURA: Tracheobronchial tree is patent, no pneumothorax. Heterogeneous lung attenuation, scattered ground-glass opacities and nodules, RIGHT greater than LEFT dense consolidation bilateral upper lobes, RIGHT middle lobe and to lesser extent lingula. Small bilateral pleural effusions. Coarsened pulmonary interstitium in the lung bases similar to prior CT.  UPPER ABDOMEN: Included view of the abdomen is unremarkable.  MUSCULOSKELETAL: Visualized  soft tissues and included osseous structures are nonacute. subcentimeter LEFT supraclavicular lymph node.  Review of the MIP images confirms the above findings.  IMPRESSION: Multifocal pneumonia.  Small pleural effusions.  Mild cardiomegaly. Severe coronary artery calcifications and/or stents.   Electronically Signed   By: Elon Alas M.D.   On: 06/24/2016 02:13   Study Conclusions  - Left ventricle: The cavity size was normal. Wall thickness was   normal. Systolic function was normal. The estimated ejection   fraction was in the range of 55% to 65%. Wall motion was normal;   there were no regional wall motion abnormalities. - Aortic valve: Valve area (VTI): 1.34 cm^2. Valve area (Vmax):   1.28 cm^2. Valve area (Vmean): 1.3 cm^2. - Left atrium: The atrium was mildly dilated.  Impressions:  - Normal LVF   Normal Wall Motion  EF=55-60%   Normal Right side   Mild AS.  Assessment and Plan: Here with multifocal pneumonia. Clinically improving -continue same antibiotics  Concern of pulmonary fibrosis. Base on reviewing his chest xrays there is no obvious  finding to suggest UIP. ? Probable uip. He is being followed By ILD clinic at [facility]. -Recommend treating the pneumonia -follow up chest xray in 3 weeks -out patient pfts -assess his immune status  (multiple pneumonias) -send back to  [facility]) ILD clinic on discharge   Copd, stage III -duo nebs -out pt pfts -d/c on pre admit regimen  Narcolepsy/sleep apnea -continue adderall and provigil while here -if possible find old sleep study -bipap at night 14/8      I have personally obtained a history, examined the patient, evaluated laboratory and imaging results, formulated the assessment and plan and placed orders.  The Patient requires high complexity decision making for assessment and support, frequent evaluation and titration of therapies, application of advanced monitoring technologies and  extensive interpretation of multiple databases.   Shunda Rabadi,M.D. Pulmonary & Critical care Medicine [facility]

## 2016-06-27 DIAGNOSIS — J189 Pneumonia, unspecified organism: Secondary | ICD-10-CM

## 2016-06-27 DIAGNOSIS — J8410 Pulmonary fibrosis, unspecified: Secondary | ICD-10-CM

## 2016-06-27 DIAGNOSIS — E662 Morbid (severe) obesity with alveolar hypoventilation: Secondary | ICD-10-CM

## 2016-06-27 DIAGNOSIS — J441 Chronic obstructive pulmonary disease with (acute) exacerbation: Secondary | ICD-10-CM

## 2016-06-27 LAB — GLUCOSE, CAPILLARY
Glucose-Capillary: 99 mg/dL (ref 65–99)
Glucose-Capillary: 111 mg/dL — ABNORMAL HIGH (ref 65–99)

## 2016-06-27 MED ORDER — ACETAMINOPHEN 325 MG PO TABS: 650.0000 mg | ORAL_TABLET | Freq: Four times a day (QID) | ORAL | Status: DC | PRN

## 2016-06-27 MED ORDER — GABAPENTIN 300 MG PO CAPS: 300.0000 mg | ORAL_CAPSULE | Freq: Three times a day (TID) | ORAL | 0 refills | Status: DC

## 2016-06-27 MED ORDER — TIOTROPIUM BROMIDE MONOHYDRATE 18 MCG IN CAPS: 18.0000 ug | ORAL_CAPSULE | Freq: Every day | RESPIRATORY_TRACT | 0 refills | Status: DC

## 2016-06-27 MED ORDER — AMOXICILLIN-POT CLAVULANATE 875-125 MG PO TABS: 1.0000 | ORAL_TABLET | Freq: Two times a day (BID) | ORAL | 0 refills | Status: DC

## 2016-06-27 MED ORDER — AZITHROMYCIN 250 MG PO TABS: ORAL_TABLET | ORAL | 0 refills | Status: DC

## 2016-06-27 MED ORDER — PREDNISONE 10 MG (21) PO TBPK: 10.0000 mg | ORAL_TABLET | Freq: Every day | ORAL | 0 refills | Status: DC

## 2016-06-27 MED ORDER — AMPHETAMINE-DEXTROAMPHET ER 15 MG PO CP24: 15.0000 mg | ORAL_CAPSULE | Freq: Three times a day (TID) | ORAL | 0 refills | Status: DC

## 2016-06-27 MED ORDER — DOCUSATE SODIUM 100 MG PO CAPS: 100.0000 mg | ORAL_CAPSULE | Freq: Every day | ORAL | 0 refills | Status: DC | PRN

## 2016-06-27 MED ORDER — CLOPIDOGREL BISULFATE 75 MG PO TABS: 75.0000 mg | ORAL_TABLET | Freq: Every day | ORAL | 0 refills | Status: DC

## 2016-06-27 NOTE — Discharge Summary (Addendum)
[facility] [place] NAME: Miguel Palmer    MR#:  [id number]  DATE OF BIRTH:  [birth year]  DATE OF ADMISSION:  06/24/2016 ADMITTING PHYSICIAN: Harrie Foreman, MD  DATE OF DISCHARGE: 06/27/16 PRIMARY CARE PHYSICIAN: [name], MD    ADMISSION DIAGNOSIS:  SOB (shortness of breath) [R06.02] HCAP (healthcare-associated pneumonia) [J18.9] Acute on chronic respiratory failure, unspecified whether with hypoxia or hypercapnia ([place]) [J96.20]  DISCHARGE DIAGNOSIS:  Active Problems:   HCAP (healthcare-associated pneumonia) Acute on chronic hypoxic respiratory failure needing 2-4 L of oxygen Chronic pulmonary fibrosis  SECONDARY DIAGNOSIS:   Past Medical History:  Diagnosis Date  . Arteriosclerosis    7 stents placed no s/s x 7 years  . Biceps tendinitis 05/29/2014  . COPD (chronic obstructive pulmonary disease) ([place])   . Diabetes mellitus without complication ([place])   . Disorder of rotator cuff 05/29/2014  . Edema    bilateral lower extremities  . Frank hematuria 04/28/2013  . Memory change 01/13/2014  . Narcolepsy   . Obesity   . 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 Oxygen deficiency     HOSPITAL COURSE:  HPI: The patient with past medical history of pulmonary fibrosis and diabetes presents emergency department complaining of shortness of breath. This is acute on chronic. The patient has had a productive cough intermittently for months. He states that the sputum ranges from "green cottage cheese" to various shades of red area most recently he had some scant hemoptysis that was bright red but he states sputum is more frequently "plum-colored". He is on chronic oxygen therapy and becomes dyspneic just walking across a room or even while showering if he is not breathing through his nose. CT of the chest in the emergency department revealed multifocal pneumonia. Sepsis protocol was initiated and the hospitalist service was contacted  for further management.  1. Pneumonia: Healthcare associated underlying pulmonary fibrosis  on cefepime and vancomycin clinically improving , will change abx 10 more days augmentin. Vision will be taking azithromycin 250 mg every Monday, Wednesday and Friday every week. Outpatient follow-up with pulmonology Dr. Ashby Dawes   Neg MRSA PCR. DC vancomycin   Supplemental oxygen per home regimen. Patient was seen by [facility] pulmonology in the past, records could not be retrieved  refusing to see  dr.Flemming IS Solu-Medrol is added for bronchial constriction,Clinically better. We will change to by mouth prednisone  2. Sepsis: The patient meets criteria via tachycardia, tachypnea and leukocytosisAt the time of admission Services healthcare associated pneumonia  hemodynamically stable.  Follow blood cultures -2 are negative. MRSA PCR is negative. Discontinued vancomycin, discharged with by mouth Augmentin  3. Coronary artery disease: Stable; continue Plavix and Imdur. Discontinue patient's home medication Effient as patient cannot afford it  4. Atrial fibrillation: New diagnosis as of a few weeks ago. Rate controlled F/u with [facility] cardiology.  5. Diabetes mellitus type II: Hold oral hyperglycemic agents. Sliding scale insulin while hospitalized. Gabapentin for neuropathy.  6. COPD: Continue Spiriva  7. Dyslipidemia: Continue statin therapy and niacin  8. Narcolepsy: Continue Provigil and Adderall  9. DVT prophylaxis: SCDs DISCHARGE CONDITIONS:   STABLE  CONSULTS OBTAINED:     PROCEDURES NONE  DRUG ALLERGIES:   Allergies  Allergen Reactions  . Pollen Extract Shortness Of Breath    Post nasal drip  . Other Other (See Comments)    pepper  . Morphine And Related Itching    (+) itching and redness noted to IV insertion site  following administration. See ED nurses note from 11/25 at 0449.    DISCHARGE MEDICATIONS:   Current Discharge Medication List    START taking  these medications   Details  acetaminophen (TYLENOL) 325 MG tablet Take 2 tablets (650 mg total) by mouth every 6 (six) hours as needed for mild pain (or Fever >/= 101).    azithromycin (ZITHROMAX) 250 MG tablet every Monday, Wednesday and Friday Qty: 15 each, Refills: 0    clopidogrel (PLAVIX) 75 MG tablet Take 1 tablet (75 mg total) by mouth daily. Qty: 30 tablet, Refills: 0    docusate sodium (COLACE) 100 MG capsule Take 1 capsule (100 mg total) by mouth daily as needed for mild constipation. Qty: 10 capsule, Refills: 0    predniSONE (STERAPRED UNI-PAK 21 TAB) 10 MG (21) TBPK tablet Take 1 tablet (10 mg total) by mouth daily. Take 6 tablets by mouth for 1 day followed by  5 tablets by mouth for 1 day followed by  4 tablets by mouth for 1 day followed by  3 tablets by mouth for 1 day followed by  2 tablets by mouth for 1 day followed by  1 tablet by mouth for a day and stop Qty: 21 tablet, Refills: 0      CONTINUE these medications which have CHANGED   Details  amoxicillin-clavulanate (AUGMENTIN) 875-125 MG tablet Take 1 tablet by mouth 2 (two) times daily. Qty: 20 tablet, Refills: 0    amphetamine-dextroamphetamine (ADDERALL XR) 15 MG 24 hr capsule Take 1 capsule by mouth 3 (three) times daily. Qty: 90 capsule, Refills: 0    gabapentin (NEURONTIN) 300 MG capsule Take 1 capsule (300 mg total) by mouth 3 (three) times daily. Qty: 90 capsule, Refills: 0    tiotropium (SPIRIVA) 18 MCG inhalation capsule Place 1 capsule (18 mcg total) into inhaler and inhale daily. Qty: 30 capsule, Refills: 0      CONTINUE these medications which have NOT CHANGED   Details  Cinnamon 500 MG capsule Take 500 mg by mouth daily.     Coenzyme Q10 200 MG capsule Take 200 mg by mouth 2 (two) times daily.     [employer] OMEGA-3 KRILL OIL 300 MG CAPS Take 1 capsule by mouth daily.     dextroamphetamine (DEXEDRINE SPANSULE) 15 MG 24 hr capsule Take 1 capsule by mouth 3 (three) times daily.  Refills: 0     Ginkgo Biloba 60 MG TABS Take 60 mg by mouth 2 (two) times daily.     Glucosamine Sulfate 1000 MG CAPS Take 1,000 mg by mouth 2 (two) times daily.     guaiFENesin (MUCINEX) 600 MG 12 hr tablet Take 600 mg by mouth 2 (two) times daily as needed.     isosorbide mononitrate (IMDUR) 30 MG 24 hr tablet TAKE 1 TABLET BY MOUTH EVERY DAY    metFORMIN (GLUCOPHAGE) 500 MG tablet TAKE 1 TABLET BY MOUTH DAILY.    metoprolol succinate (TOPROL-XL) 25 MG 24 hr tablet TAKE 1 TABLET BY MOUTH EVERY DAY    modafinil (PROVIGIL) 200 MG tablet three times daily    Multiple Vitamins-Minerals (MULTIVITAMIN & MINERAL PO) Take by mouth.    niacin (NIASPAN) 1000 MG CR tablet Take 1,000 mg by mouth at bedtime.     nitroGLYCERIN (NITROSTAT) 0.4 MG SL tablet Place 0.4 mg under the tongue every 5 (five) minutes as needed for chest pain.     PHOSPHATIDYLSERINE PO Take 60 mg by mouth 3 (three) times daily.    PROAIR  HFA 108 (90 Base) MCG/ACT inhaler Inhale 2 puffs into the lungs 4 (four) times daily as needed. Refills: 0    rosuvastatin (CRESTOR) 20 MG tablet Take 20 mg by mouth at bedtime.     FLUZONE HIGH-DOSE 0.5 ML SUSY TO BE ADMINISTERED BY PHARMACIST FOR IMMUNIZATION Refills: 0      STOP taking these medications     prasugrel (EFFIENT) 10 MG TABS tablet          DISCHARGE INSTRUCTIONS:   Continue home health physical therapy in 2-4 L of oxygen via nasal cannula Outpatient follow-up with primary care physician in a week Outpatient follow-up with pulmonology in 3-5 days  DIET:  Cardiac diet, DIABETIC  DISCHARGE CONDITION:  Fair  ACTIVITY:  Activity as tolerated by PT  OXYGEN:  Home Oxygen: Yes.     Oxygen Delivery: 2-4  liters/min via Patient connected to nasal cannula oxygen  DISCHARGE LOCATION:  home   If you experience worsening of your admission symptoms, develop shortness of breath, life threatening emergency, suicidal or homicidal thoughts you must seek medical attention  immediately by calling 911 or calling your MD immediately  if symptoms less severe.  You Must read complete instructions/literature along with all the possible adverse reactions/side effects for all the Medicines you take and that have been prescribed to you. Take any new Medicines after you have completely understood and accpet all the possible adverse reactions/side effects.   Please note  You were cared for by a hospitalist during your hospital stay. If you have any questions about your discharge medications or the care you received while you were in the hospital after you are discharged, you can call the unit and asked to speak with the hospitalist on call if the hospitalist that took care of you is not available. Once you are discharged, your primary care physician will handle any further medical issues. Please note that NO REFILLS for any discharge medications will be authorized once you are discharged, as it is imperative that you return to your primary care physician (or establish a relationship with a primary care physician if you do not have one) for your aftercare needs so that they can reassess your need for medications and monitor your lab values.     Today  Chief Complaint  Patient presents with  . Shortness of Breath   Patient is feeling much better, seen by pulmonology Dr. Ashby Dawes, lives on 2-4 L of oxygen via nasal cannula chronically. Hemoptysis resolved  ROS:  CONSTITUTIONAL: Denies fevers, chills. Denies any fatigue, weakness.  EYES: Denies blurry vision, double vision, eye pain. EARS, NOSE, THROAT: Denies tinnitus, ear pain, hearing loss. RESPIRATORY: Denies cough, wheeze, shortness of breath.  CARDIOVASCULAR: Denies chest pain, palpitations, edema.  GASTROINTESTINAL: Denies nausea, vomiting, diarrhea, abdominal pain. Denies bright red blood per rectum. GENITOURINARY: Denies dysuria, hematuria. ENDOCRINE: Denies nocturia or thyroid problems. HEMATOLOGIC AND  LYMPHATIC: Denies easy bruising or bleeding. SKIN: Denies rash or lesion. MUSCULOSKELETAL: Denies pain in neck, back, shoulder, knees, hips or arthritic symptoms.  NEUROLOGIC: Denies paralysis, paresthesias.  PSYCHIATRIC: Denies anxiety or depressive symptoms.   VITAL SIGNS:  Blood pressure 119/66, pulse 95, temperature 98 F (36.7 C), temperature source Oral, resp. rate 18, height 6' 3"  (1.905 m), weight (!) 153.8 kg (339 lb 0.4 oz), SpO2 92 %.  I/O:    Intake/Output Summary (Last 24 hours) at 06/27/16 1600 Last data filed at 06/27/16 1300  Gross per 24 hour  Intake  240 ml  Output                0 ml  Net              240 ml    PHYSICAL EXAMINATION:  GENERAL:  76 y.o.-year-old patient lying in the bed with no acute distress.  EYES: Pupils equal, round, reactive to light and accommodation. No scleral icterus. Extraocular muscles intact.  HEENT: Head atraumatic, normocephalic. Oropharynx and nasopharynx clear.  NECK:  Supple, no jugular venous distention. No thyroid enlargement, no tenderness.  LUNGS: Normal breath sounds bilaterally, no wheezing,  Positive rales,rhonchi , no  crepitation. No use of accessory muscles of respiration.  CARDIOVASCULAR: S1, S2 normal. No murmurs, rubs, or gallops.  ABDOMEN: Soft, non-tender, non-distended. Bowel sounds present. No organomegaly or mass.  EXTREMITIES: No pedal edema, cyanosis, or clubbing.  NEUROLOGIC: Cranial nerves II through XII are intact. Muscle strength 5/5 in all extremities. Sensation intact. Gait not checked.  PSYCHIATRIC: The patient is alert and oriented x 3.  SKIN: No obvious rash, lesion, or ulcer.   DATA REVIEW:   CBC  Recent Labs Lab 06/25/16 0543  WBC 5.6  HGB 12.0*  HCT 36.1*  PLT 133*    Chemistries   Recent Labs Lab 06/24/16 0052 06/25/16 0543  NA 136 137  K 4.3 4.5  CL 95* 96*  CO2 36* 37*  GLUCOSE 146* 118*  BUN 20 13  CREATININE 0.64 0.69  CALCIUM 9.4 9.2  AST 34  --   ALT 19   --   ALKPHOS 71  --   BILITOT 0.8  --     Cardiac Enzymes  Recent Labs Lab 06/24/16 0052  TROPONINI <0.03    Microbiology Results  Results for orders placed or performed during the hospital encounter of 06/24/16  Culture, blood (routine x 2)     Status: None (Preliminary result)   Collection Time: 06/24/16  2:08 AM  Result Value Ref Range Status   Specimen Description BLOOD RH  Final   Special Requests   Final    BOTTLES DRAWN AEROBIC AND ANAEROBIC AER 12ML ANA 11ML   Culture NO GROWTH 3 DAYS  Final   Report Status PENDING  Incomplete  Culture, blood (routine x 2)     Status: None (Preliminary result)   Collection Time: 06/24/16  2:08 AM  Result Value Ref Range Status   Specimen Description BLOOD L AC  Final   Special Requests   Final    BOTTLES DRAWN AEROBIC AND ANAEROBIC AER 7ML ANA 8ML   Culture NO GROWTH 3 DAYS  Final   Report Status PENDING  Incomplete  MRSA PCR Screening     Status: None   Collection Time: 06/24/16  5:08 AM  Result Value Ref Range Status   MRSA by PCR NEGATIVE NEGATIVE Final    Comment:        The GeneXpert MRSA Assay (FDA approved for NASAL specimens only), is one component of a comprehensive MRSA colonization surveillance program. It is not intended to diagnose MRSA infection nor to guide or monitor treatment for MRSA infections.     RADIOLOGY:  Dg Chest 2 View  Result Date: 06/24/2016 CLINICAL DATA:  Shortness of breath, hemoptysis beginning yesterday morning. Hypoxia. Recent diagnosis of pneumonia. History of COPD, coronary artery stenting. EXAM: CHEST  2 VIEW COMPARISON:  Chest radiograph May 17, 2016 FINDINGS: Cardiac silhouette is mildly enlarged unchanged. Mediastinal silhouette is nonsuspicious. Mild diffuse interstitial prominence. Patchy RIGHT mid  lung zone airspace opacity. Chronic bibasilar coarsened interstitium. No pneumothorax. Soft tissue planes and included osseous structures are nonsuspicious. IMPRESSION: Patchy RIGHT  midlung zone airspace opacity concerning for pneumonia. Followup PA and lateral chest X-ray is recommended in 3-4 weeks following trial of antibiotic therapy to ensure resolution and exclude underlying malignancy. Mild cardiomegaly, chronic interstitial changes with probable bibasilar fibrosis. Electronically Signed   By: Elon Alas M.D.   On: 06/24/2016 01:20   Ct Angio Chest Pe W/cm &/or Wo Cm  Result Date: 06/24/2016 CLINICAL DATA:  Shortness of breath, hemoptysis beginning at 10 a.m. yesterday. Hypoxia. Recent diagnosis of pneumonia. History of COPD. EXAM: CT ANGIOGRAPHY CHEST WITH CONTRAST TECHNIQUE: Multidetector CT imaging of the chest was performed using the standard protocol during bolus administration of intravenous contrast. Multiplanar CT image reconstructions and MIPs were obtained to evaluate the vascular anatomy. CONTRAST:  100 cc Isovue 370 COMPARISON:  Chest radiograph June 24, 2016 at 0049 hours and CT chest March 12, 2015 FINDINGS: CARDIOVASCULAR: Adequate contrast opacification of the pulmonary artery's. Main pulmonary artery is enlarged at 3.8 cm. No pulmonary arterial filling defects to the level of the subsegmental branches given mild respiratory motion. Heart size is mildly enlarged, no right heart strain. No pericardial effusions. Coronary artery calcifications versus stents. Thoracic aorta is normal course and caliber, unremarkable. MEDIASTINUM/NODES: 8 mm RIGHT paratracheal lymph node, 9 mm aortopulmonary window lymph night, 12 mm carinal lymph node and 13 mm subcarinal lymph node. No mediastinal mass. LUNGS/PLEURA: Tracheobronchial tree is patent, no pneumothorax. Heterogeneous lung attenuation, scattered ground-glass opacities and nodules, RIGHT greater than LEFT dense consolidation bilateral upper lobes, RIGHT middle lobe and to lesser extent lingula. Small bilateral pleural effusions. Coarsened pulmonary interstitium in the lung bases similar to prior CT. UPPER ABDOMEN:  Included view of the abdomen is unremarkable. MUSCULOSKELETAL: Visualized soft tissues and included osseous structures are nonacute. subcentimeter LEFT supraclavicular lymph node. Review of the MIP images confirms the above findings. IMPRESSION: Multifocal pneumonia.  Small pleural effusions. Mild cardiomegaly. Severe coronary artery calcifications and/or stents. Electronically Signed   By: Elon Alas M.D.   On: 06/24/2016 02:13    EKG:   Orders placed or performed during the hospital encounter of 06/24/16  . EKG 12-Lead  . EKG 12-Lead  . ED EKG  . ED EKG      Management plans discussed with the patient, family and they are in agreement.  CODE STATUS:     Code Status Orders        Start     Ordered   06/24/16 0452  Full code  Continuous     06/24/16 0451    Code Status History    Date Active Date Inactive Code Status Order ID Comments User Context   05/17/2016 10:01 AM 05/19/2016  5:52 PM Full Code [id number]  Saundra Shelling, MD Inpatient      TOTAL TIME TAKING CARE OF THIS PATIENT: 45 minutes.   Note: This dictation was prepared with Dragon dictation along with smaller phrase technology. Any transcriptional errors that result from this process are unintentional.   @[facility] @  on 06/27/2016 at 4:00 PM  Between 7am to 6pm - Pager - [phone]  After 6pm go to [email or web address] - password [email or web address] [facility]  [facility]  Office  [phone]  CC: Primary care physician; [name], MD

## 2016-06-27 NOTE — Consult Note (Signed)
Name: Miguel Palmer MRN: [id number] DOB: Nov 15, 1940    ADMISSION DATE:  06/24/2016 CONSULTATION DATE:  06/27/2016  REFERRING MD :  Dr. Margaretmary Eddy  CHIEF COMPLAINT:  Pulmonary fibrosis   BRIEF PATIENT DESCRIPTION: This is a 76 yo male who presented to [facility] ER 01/2 with acute on chronic respiratory failure secondary to pulmonary fibrosis and HCAP  SIGNIFICANT EVENTS  01/2-Pt admitted to [facility] unit with acute on chronic respiratory failure secondary to pulmonary fibrosis and HCAP  01/5-PCCM consulted for additional management and recommendations for recurrent pneumonia and pulmonary fibrosis   STUDIES:  CT Angio Chest 01/2>>Multifocal pneumonia.  Small pleural effusions. Mild cardiomegaly. Severe coronary artery calcifications and/or stents.    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  Obesity,  Memory change, Frank hematuria, Diabetes mellitus, COPD, Edema, O2 dependent 4L via nasal canula, and Oxygen deficiency.  He presented to [facility] ER on 01/5 with c/o worsening shortness of breath and generalized weakness onset 01/2.  He also complained of an intermittent productive cough that has been present for months at times the sputum is blood tinged. He has had two hospital admission for CAP within the last 6 weeks prior to this presentation to the ER.  During his last hospitalization he was initially placed on Levaquin, and then was discharged on Augmentin.  Due to lack of improvement of symptoms he saw his PCP 12/20 who extended his Augmentin course to almost 3 weeks.  The pt is concerned about his pulmonary fibrosis.  He was subsequently admitted to [facility] unit for acute on chronic respiratory failure secondary to HCAP and pulmonary fibrosis.  PCCM consulted 01/5 for additional management and  recommendations for recurrent pneumonia and pulmonary fibrosis.   On speaking further with the patient he and his wife tell me that he has now been admitted to the hospital 3 times in 3 months. The first was here in [facility], then at [facility], and now here.  He started seeing a pulmologist, Dr. Raul Del about 2 years ago when he was diagnosed with pneumonia. He was placed on spiriva but he did not take it as he did not feel that it helped him and was not a cure. He has been prescribed CPAP in the past, but could not tolerate the mask, possibly due to leaks from his beard.  He then went to see a  Pulmonologist more recently at [facility], Dr. Normand Sloop, whom they liked but did not like the distance.  On reviewing his information, he was a previous smoker but has not smoked in several years. 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  He was diagnosed w/ narcolepsy about 15 years ago and continues treatment w/ amphetamine and modafenil.   CT GU 05/17/13: Bibasilar pulmonary opacities may represent atelectasis. The heart size is normal. There are coronary artery calcifications.   He was diagnosed in 06/2014 with pneumonia when he had hemoptysis, this cleared up with abx.   --07/2014: f/u with Dr. Raul Del; ACE, ANCA negative. Referred to pulm rehab, diagnosed with pulmonary fibrosis.  --06/06/15 2nd opinion at [place] with Dr. Arloa Koh, he was noted to have COPD, RLD, ?OHS. He was recommended to use advair, spiriva. 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  --09/04/15; Referral to [facility] ILD clinic, Dr. Ruthann Cancer. He was noted to have  OSA, morbid obesity, diastolic dysfunction, untreated OSA. It was thought that his ILD was mild, mostly basal and not progressing at that time and did not require anti-fibrotic therapy. However it was noted that he needed to be watched closely.   Currently they are requesting my opinion that the pneumonia is "completely gone" and that he will not have to come back to the hospital  again.   SPIROMETRY 03/05/15: FVC was 2.92 liters, 63% of predicted FEV1 was 2.09, 58% of predicted FEV1 ratio was 72 FEF 25-75% liters per second was 38% of predicted *Only one effort performed, pt had syncopal episode.  LUNG VOLUMES: TLC was 62% of predicted RV was 53% of predicted  DIFFUSION CAPACITY: DLCO was 45% of predicted DLCO/VA was 85% of predicted  FLOW VOLUME LOOP: Slightly delayed expiratory flow volume loop  Impression Moderate obstruction TLC is moderately decreased c/w restriction DLCO is severely decreased but corrects for VA  ----------------  SPIROMETRY (older): FVC was 3.64 liters, 79% of predicted FEV1 was 2.32, 64% of predicted FEV1 ratio was 64 FEF 25-75% liters per second was 28% of predicted  LUNG VOLUMES: TLC was 76% of predicted RV was 64% of predicted  DIFFUSION CAPACITY: DLCO was 45% of predicted DLCO/VA was 92% of predicted  FLOW VOLUME LOOP: Mild decrease expiratory flow volume loop  SUMMARY: Moderate obstruction on spirometry Mild restriction on TLC DLCO is severely decreased but corrects for VA. This finding suggests extrathoracic restriction   01/10/15  Echocardiogram 2D complete  Result Value Ref Range  LV Ejection Fraction (%) 64   [facility] 2nd Opinion- pulmonary 06/06/15.  A/P: 76 yo , Mr Mcelhannon, seen in consultation at request of Dr. [name] for 2nd opinion for COPD.  Patient has multiple medical problems that include, morbid obesity, obstructive sleep apnea and narcolepsy and possible underlying obesity hypoventilation syndrome, coronary artery disease and likely diastolic dysfunction. In addition he also seems quite depressed due to ongoing medical issues.  Our evaluation and plan as follows 1) class IV dyspnea: Combination of morbid obesity, smoking related COPD with FEV1 1.85 80/51%., severe deconditioning  He also has undiagnosed restrictive airways disease with vital capacity 2.85/60%, DLCO 14.2 /57% and  bilateral persistent crackles. This needs to be evaluated further. I have requested a dedicated high-resolution CT scan. It will also help evaluate the lung nodule reported from May, 2016. a)- encouraged water aerobics/pulmonary rehab and daily multiple short walks. Swimming would be best exercise for him due to his backache b) has not been benefitted by Advair or Spiriva. Encouraged to use nebulizer. Sending a script for nebulizer and nebulized medications with Brovana and Pulmicort as well as albuterol as needed Will continue smoking cessation Up to date on immunizations  2) underlying heart failure: Patient has chronic lower extremity swelling and a murmur. He is recent echo from cardiology office has been reported as normal LV function but I suspect he has ongoing diastolic dysfunction. Would tremendously benefit by heart failure regimen. Also has untreated obstructive sleep apnea and based on his presentation highly concerning for obesity hypoventilation syndrome. Counselled on use of CPAP regularly.  3) chronic respiratory failure: Secondary to above issues Oxygen supplementation  He will require multiple visits and close monitoring for education and management. He prefers to come to our clinic for further management.  F/U in 3 to 6 months-- Ct chest 06/26/14 [facility] LUNG WINDOWS:There is an area of ill-defined primarily groundglass  attenuation in the peripheral aspect of the right upper lobe  measuring 3.0  x 2.5 cm, image 32 of series 7. There is basilar predominant subpleural  reticulation of both lungs. Compared with imaging of the lung bases from  11/09/2013, this appears to have progressed particularly in the left lung  base. Very mild bronchiectasis in the lung bases. Lungs are hyperexpanded. IMPRESSION:     1. 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  2. Groundglass attenuation opacity in  the right upper lobe could represent  a focus of infection or inflammation, but short-term follow-up is  recommended to ensure resolution. If persistent, continued surveillance  imaging or biopsy may be required.  3. Mild mediastinal lymphadenopathy.  4. Coronary artery calcifications.    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  has a past surgical history that includes Coronary stent placement and Knee arthroscopy (Right). Prior to Admission medications   Medication Sig Start Date End Date Taking? Authorizing Provider  amoxicillin-clavulanate (AUGMENTIN) 875-125 MG tablet Take 1 tablet by mouth 2 (two) times daily. 06/18/16  Yes Historical Provider, MD  Cinnamon 500 MG capsule Take 500 mg by mouth daily.    Yes Historical Provider, MD  Coenzyme Q10 200 MG capsule Take 200 mg by mouth 2 (two) times daily.    Yes Historical Provider, MD  [employer] OMEGA-3 KRILL OIL 300 MG CAPS Take 1 capsule by mouth daily.    Yes Historical Provider, MD  dextroamphetamine (DEXEDRINE SPANSULE) 15 MG 24 hr capsule Take 1 capsule by mouth 3 (three) times daily.  05/29/16  Yes Historical Provider, MD  gabapentin (NEURONTIN) 300 MG capsule Take 1 capsule by mouth 3 (three) times daily. 06/09/16  Yes Historical Provider, MD  Ginkgo Biloba 60 MG TABS Take 60 mg by mouth 2 (two) times daily.    Yes Historical Provider, MD  Glucosamine Sulfate 1000 MG CAPS Take 1,000 mg by mouth 2 (two) times daily.    Yes Historical Provider, MD  guaiFENesin (MUCINEX) 600 MG 12 hr tablet Take 600 mg by mouth 2 (two) times daily as needed.    Yes Historical Provider, MD  isosorbide mononitrate  (IMDUR) 30 MG 24 hr tablet TAKE 1 TABLET BY MOUTH EVERY DAY 05/22/14  Yes Historical Provider, MD  metFORMIN (GLUCOPHAGE) 500 MG tablet TAKE 1 TABLET BY MOUTH DAILY. 10/17/14  Yes Historical Provider, MD  metoprolol succinate (TOPROL-XL) 25 MG 24 hr tablet TAKE 1 TABLET BY MOUTH EVERY DAY 08/11/14  Yes Historical Provider, MD  modafinil (PROVIGIL) 200 MG tablet three times daily 03/26/16  Yes Historical Provider, MD  Multiple Vitamins-Minerals (MULTIVITAMIN & MINERAL PO) Take by mouth.   Yes Historical Provider, MD  niacin (NIASPAN) 1000 MG CR tablet Take 1,000 mg by mouth at bedtime.  10/03/14  Yes Historical Provider, MD  nitroGLYCERIN (NITROSTAT) 0.4 MG SL tablet Place 0.4 mg under the tongue every 5 (five) minutes as needed for chest pain.  10/09/13  Yes Historical Provider, MD  PHOSPHATIDYLSERINE PO Take 60 mg by mouth 3 (three) times daily.   Yes Historical Provider, MD  prasugrel (EFFIENT) 10 MG TABS tablet TAKE 1 TABLET BY MOUTH EVERY DAY 05/22/14  Yes Historical Provider, MD  PROAIR HFA 108 (90 Base) MCG/ACT inhaler Inhale 2 puffs into the lungs 4 (four) times daily  as needed. 06/09/16  Yes Historical Provider, MD  rosuvastatin (CRESTOR) 20 MG tablet Take 20 mg by mouth at bedtime.  10/02/14  Yes Historical Provider, MD  FLUZONE HIGH-DOSE 0.5 ML SUSY TO BE ADMINISTERED BY PHARMACIST FOR IMMUNIZATION 04/04/16   Historical Provider, MD   Allergies  Allergen Reactions  . Pollen Extract Shortness Of Breath    Post nasal drip  . Other Other (See Comments)    pepper  . Morphine And Related Itching    (+) itching and redness noted to IV insertion site following administration. See ED nurses note from 11/25 at 0449.    FAMILY HISTORY:  family history includes Alzheimer's disease in his father; Asthma in his father; Dementia in his mother; Emphysema in his father. SOCIAL HISTORY:  reports that he has quit smoking. His smoking use included Cigarettes. He has a 44.00 pack-year smoking history. He  has never used smokeless tobacco. He reports that he does not drink alcohol or use drugs.  REVIEW OF SYSTEMS:  Positives in BOLD Constitutional: Negative for fever, chills, weight loss, malaise/fatigue and diaphoresis.  HENT: Negative for hearing loss, ear pain, nosebleeds, congestion, sore throat, neck pain, tinnitus and ear discharge.   Eyes: Negative for blurred vision, double vision, photophobia, pain, discharge and redness.  Respiratory: Negative for cough, hemoptysis, sputum production, shortness of breath, wheezing and stridor.   Cardiovascular: Negative for chest pain, palpitations, orthopnea, claudication, leg swelling and PND.  Gastrointestinal: Negative for heartburn, nausea, vomiting, abdominal pain, diarrhea, constipation, blood in stool and melena.  Genitourinary: Negative for dysuria, urgency, frequency, hematuria and flank pain.  Musculoskeletal: Negative for myalgias, back pain, joint pain and falls.  Skin: Negative for itching and rash.  Neurological: Negative for dizziness, tingling, tremors, sensory change, speech change, focal weakness, seizures, loss of consciousness, weakness and headaches.  Endo/Heme/Allergies: Negative for environmental allergies and polydipsia. Does not bruise/bleed easily.  SUBJECTIVE:   VITAL SIGNS: Temp:  [98 F (36.7 C)-98.6 F (37 C)] 98 F (36.7 C) (01/05 1321) Pulse Rate:  [95-112] 95 (01/05 1321) Resp:  [18-20] 18 (01/05 1321) BP: (101-147)/(63-79) 119/66 (01/05 1321) SpO2:  [91 %-96 %] 92 % (01/05 1321)  PHYSICAL EXAMINATION: General:  Morbidly obese fatigued appearing.  Neuro:  Non-focal, strength 5/5.  HEENT:  PERRLA, EOMI.  Cardiovascular:  No MRG, RRR.  Lungs:  Scattered crackles at bases.  Abdomen:  Obese, NT.  Musculoskeletal:  Normal.  Skin:  Unremarkable.    Recent Labs Lab 06/24/16 0052 06/25/16 0543  NA 136 137  K 4.3 4.5  CL 95* 96*  CO2 36* 37*  BUN 20 13  CREATININE 0.64 0.69  GLUCOSE 146* 118*     Recent Labs Lab 06/24/16 0052 06/25/16 0543  HGB 11.9* 12.0*  HCT 35.6* 36.1*  WBC 6.0 5.6  PLT 127* 133*   No results found.  ASSESSMENT / PLAN:  Acute on chronic respiratory failure secondary to HCAP --They are asking for reassurance that the patient's pneumonia will not cause him to come to the hospital again, which I could not provide. However I did think we could make some recommendations to reduce the risk of pneumonia and AECOPD in the future (below).  --I recommended that he continue the course of abx as prescribed.   AECOPD.  --I did recommend that he use the spiriva that was prescribed in the past as this may help reduce the chance for AECOPD in the future.  --I recommended that he take azithromycin 250 mg three times weekly,  we discussed the potential for GI side effects. Discussed case with the hospitalist, Dr. Margaretmary Eddy.    Pulmonary Fibrosis  --At subsequent follow up will discuss further workup for this diagnosis. He has been seen by Dr. Ruthann Cancer at [facility] ILD clinic, therefore it may be worthwhile to follow up there for this diagnosis.   OSA/narcolepsy.  --He has been intolerant of CPAP, has been asked to shave in the past to help him better tolerate the CPAP.  --He has been on medications for several years for this diagnosis, which I am not able to confirm at this time. May need to consider MSLT in future.   Morbid obesity/OHS.  --Suspect hypoventilation, elevated CO2 of 37 on BMP; could confirm with ABG in future.  --Weight loss would be beneficial.   Debility/Deconditioning.  --Recommend continuing participation in pulm rehab.   Pulmonary and [place] Pager: [phone]  06/27/2016, 2:10 PM

## 2016-06-27 NOTE — Discharge Instructions (Signed)
Continue home health physical therapy in 2-4 L of oxygen via nasal cannula Outpatient follow-up with primary care physician in a week Outpatient follow-up with pulmonology in 3-5 days

## 2016-06-27 NOTE — Evaluation (Signed)
Physical Therapy Evaluation Patient Details Name: Miguel Palmer MRN: [id number] DOB: [birth year] Today's Date: 06/27/2016   History of Present Illness  Pt is a 76 y.o. male presenting to hospital with SOB.  Pt with 2 recent hospitalizations for community acquired PNA.  Pt now admitted to hospital with health care associated PNA.  PMH includes pulmonary fibrosis, reactive airway disease, COPD on continuous home O2 (2-4 L at rest; increases O2 for activity), CAD, cardiac stent, DM, and chronic back pain.  Clinical Impression  Prior to hospital admission, pt was walking short distances with an AD and O2.  Pt lives with his wife in level entry apt with elevator access.  Currently pt is modified independent with bed mobility and CGA with transfers and ambulation (60 feet; 100 feet) with RW.  Pt prefers to use rollator that he has at home and based on pt's performance (balance) and use of RW during session this appears appropriate.  Pt's O2 at rest beginning of session 92% on 3 L O2 via nasal cannula; pt's O2 decreased to 83% sitting edge of bed but increased to 93% once O2 increased to 4 L; pt ambulated on 6 L with O2 91-93%; O2 91% end of session back on 3 L resting in bed (nursing notified).  Pt would benefit from skilled PT to address noted impairments and functional limitations.  Recommend pt discharge to home with support of wife and HHPT when medically appropriate.     Follow Up Recommendations Home health PT    Equipment Recommendations   (Pt preferring to use rollator that he has at home (appears appropriate based on pt's mobility during session))    Recommendations for Other Services       Precautions / Restrictions Precautions Precautions: Fall Precaution Comments: Per orders: may give O2 up to 6 L to maintain sats 88% and above. Restrictions Weight Bearing Restrictions: No      Mobility  Bed Mobility Overal bed mobility: Modified Independent Bed Mobility: Supine to Sit;Sit to  Supine     Supine to sit: Modified independent (Device/Increase time);HOB elevated Sit to supine: Modified independent (Device/Increase time);HOB elevated   General bed mobility comments: No assist needed; HOB mildly elevated  Transfers Overall transfer level: Needs assistance Equipment used: Rolling walker (2 wheeled) Transfers: Sit to/from Stand Sit to Stand: Min guard         General transfer comment: steady stand (x2 repetitions) with use of RW  Ambulation/Gait Ambulation/Gait assistance: Min guard;+2 safety/equipment (2nd assist for O2 tank) Ambulation Distance (Feet):  (60 feet; 100 feet) Assistive device: Rolling walker (2 wheeled) Gait Pattern/deviations: Step-through pattern Gait velocity: decreased   General Gait Details: steady without loss of balance; occasional vc's for RW use during turns (pt reporting having difficulty turning it d/t it wasn't like his rollator)  Stairs Stairs:  (Deferred d/t pt does not have to do stairs at home)          Wheelchair Mobility    Modified Rankin (Stroke Patients Only)       Balance Overall balance assessment: Needs assistance Sitting-balance support: Bilateral upper extremity supported;Feet supported Sitting balance-Leahy Scale: Normal     Standing balance support: Bilateral upper extremity supported (on RW) Standing balance-Leahy Scale: Good Standing balance comment: Dynamic standing activities                             Pertinent Vitals/Pain Pain Assessment: No/denies pain    Home Living  Family/patient expects to be discharged to:: Private residence Living Arrangements: Spouse/significant other Available Help at Discharge: Family Type of Home: Apartment Home Access: Elevator;Level entry     Home Layout: One level [place] - single point;Walker - 4 wheels;Wheelchair - manual      Prior Function Level of Independence: Independent with assistive device(s)         Comments:  Uses B SPC for mobility at home and rollator or w/c for community mobility; walks short distance, takes sitting rest break and then walks again per pt tolerance (d/t breathing and back pain).  Participates in [employer].  Pt reports 1 fall 1 month ago.  Wife assists with bathing/dressing.  Receiving HHPT.     Hand Dominance        Extremity/Trunk Assessment   Upper Extremity Assessment Upper Extremity Assessment: Generalized weakness    Lower Extremity Assessment Lower Extremity Assessment: Generalized weakness       Communication   Communication: HOH  Cognition Arousal/Alertness: Awake/alert Behavior During Therapy: WFL for tasks assessed/performed Overall Cognitive Status: Within Functional Limits for tasks assessed                      General Comments General comments (skin integrity, edema, etc.): Pt's wife present during session.  Pt agreeable to PT session.    Exercises  Ambulation; vc's for pursed lip breathing   Assessment/Plan    PT Assessment Patient needs continued PT services  PT Problem List Decreased strength;Decreased activity tolerance;Decreased balance;Decreased mobility;Cardiopulmonary status limiting activity          PT Treatment Interventions DME instruction;Gait training;Functional mobility training;Therapeutic activities;Therapeutic exercise;Balance training;Patient/family education    PT Goals (Current goals can be found in the Care Plan section)  Acute Rehab PT Goals Patient Stated Goal: to go home PT Goal Formulation: With patient/family Time For Goal Achievement: 07/11/16 Potential to Achieve Goals: Good    Frequency Min 2X/week   Barriers to discharge        Co-evaluation               End of Session Equipment Utilized During Treatment: Gait belt;Oxygen Activity Tolerance: Patient limited by fatigue Patient left: in bed;with call bell/phone within reach;with bed alarm set;with family/visitor present Nurse  Communication: Mobility status;Precautions (O2 sats with activity)         Time: 5885-0277 PT Time Calculation (min) (ACUTE ONLY): 42 min   Charges:   PT Evaluation $PT Eval Low Complexity: 1 Procedure PT Treatments $Gait Training: 8-22 mins $Therapeutic Exercise: 8-22 mins   PT G CodesLeitha Palmer 2016-07-06, 2:46 PM Miguel Palmer, [place]

## 2016-06-27 NOTE — Care Management (Signed)
Discharge to home today per Dr. Margaretmary Eddy.  [facility] will resume home health services in the home. Wife will transport. Shelbie Ammons RN MSN CCM Care Management

## 2016-06-29 LAB — CULTURE, BLOOD (ROUTINE X 2)
CULTURE: NO GROWTH
CULTURE: NO GROWTH

## 2016-07-04 LAB — HEMOGLOBIN A1C
Hgb A1c MFr Bld: 6 % — ABNORMAL HIGH (ref 4.8–5.6)
Mean Plasma Glucose: 126 mg/dL

## 2016-07-21 ENCOUNTER — Inpatient Hospital Stay: Payer: Medicare Other | Admitting: Internal Medicine

## 2016-08-07 ENCOUNTER — Inpatient Hospital Stay: Payer: Medicare Other | Admitting: Pulmonary Disease

## 2016-08-13 ENCOUNTER — Inpatient Hospital Stay: Payer: Medicare Other | Admitting: Internal Medicine

## 2017-02-06 IMAGING — CT CT CHEST W/O CM
1 series · 15 of 31 positions shown, 19 images · non-contrast
Comparison: 08/03/2014

CLINICAL DATA: Severe COPD. New diagnosis of pneumonia. Cuff L
blood for 9 months.

EXAM:
CT CHEST WITHOUT CONTRAST
TECHNIQUE: Multidetector CT imaging of the chest was performed following the
standard protocol without IV contrast.

[Series 2: routine chest wo · axial · 0.77mm/px · z∈[-32,+258]mm · 15 of 64 slices shown, 19 images]
[im 3/64  mediastinal]
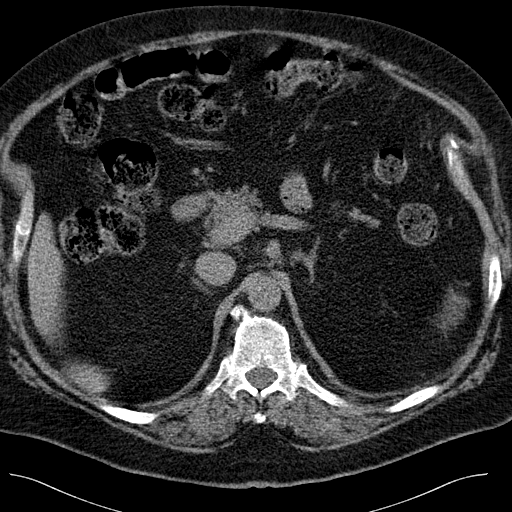
[im 3/64  lung]
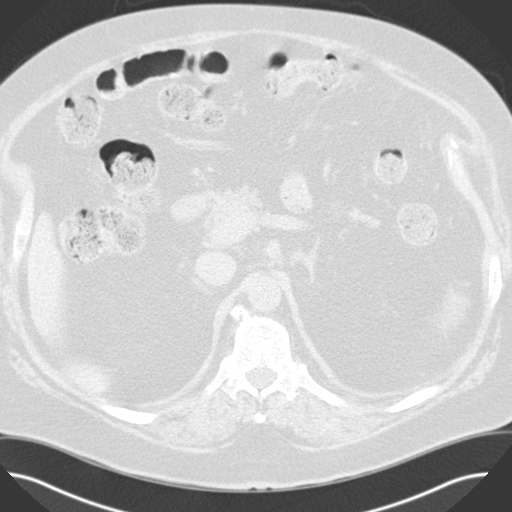
[im 8/64  lung]
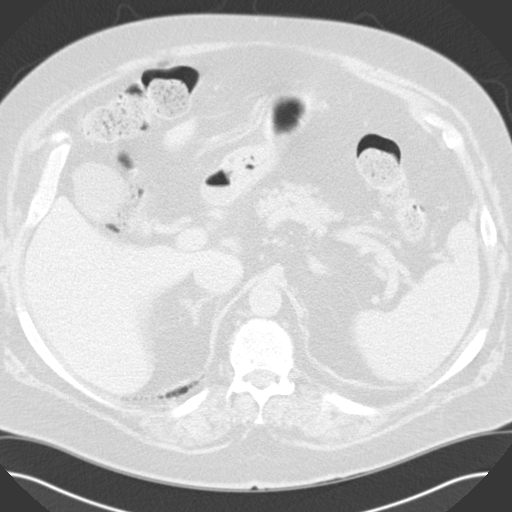
[im 12/64  lung]
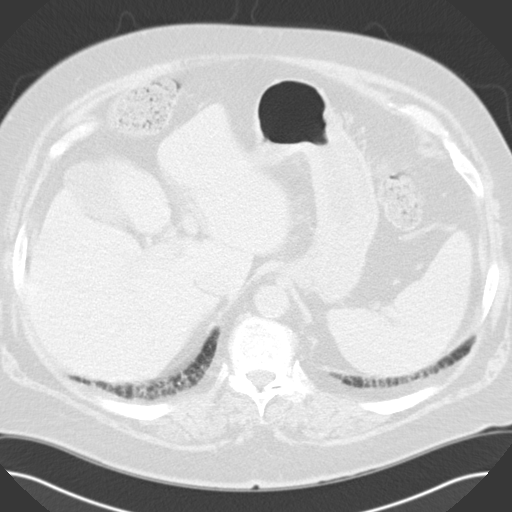
[im 15/64  lung]
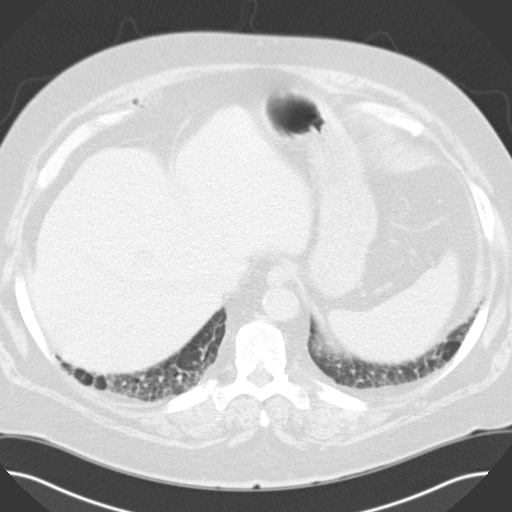
[im 19/64  mediastinal]
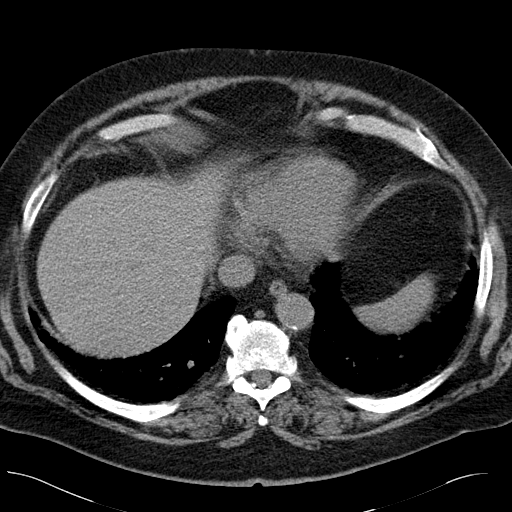
[im 19/64  lung]
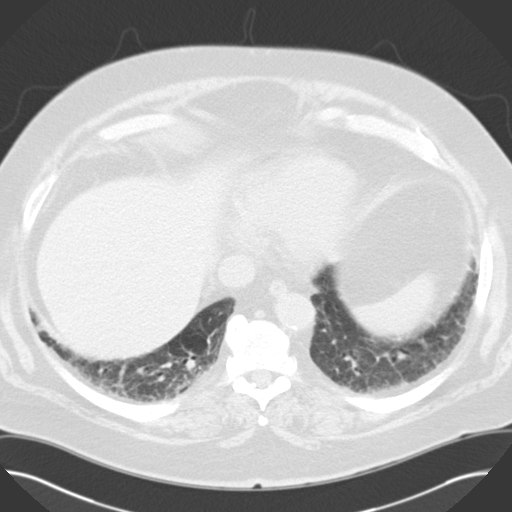
[im 24/64  lung]
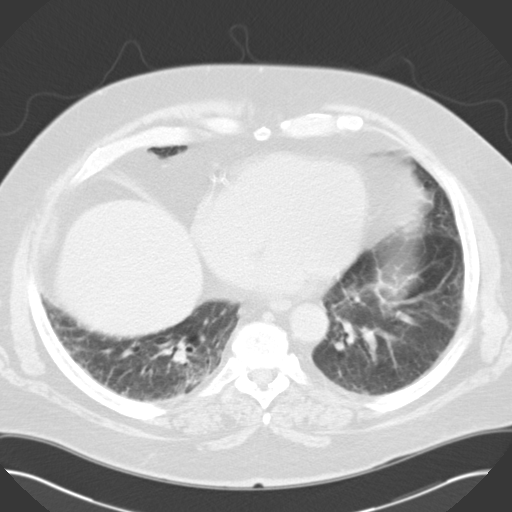
[im 29/64  lung]
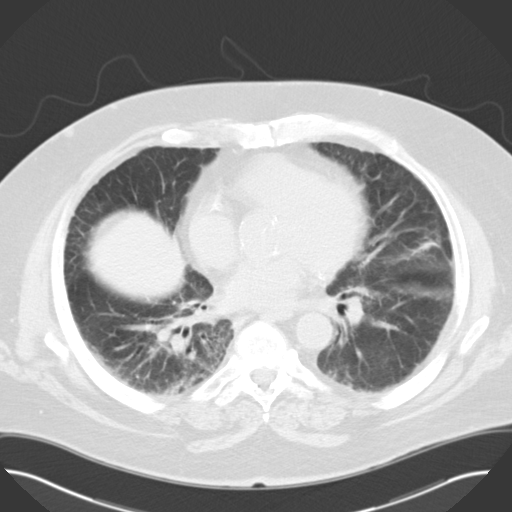
[im 33/64  lung]
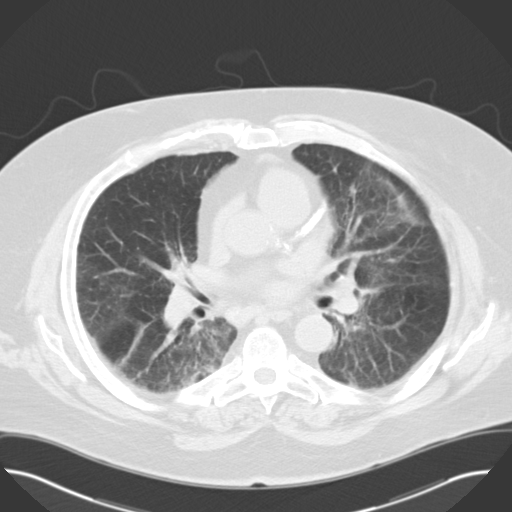
[im 36/64  mediastinal]
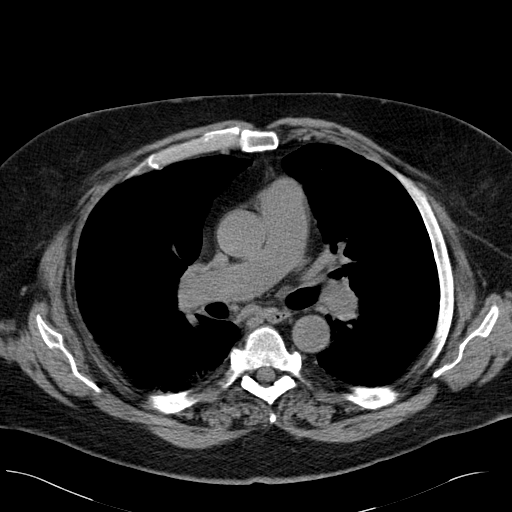
[im 36/64  lung]
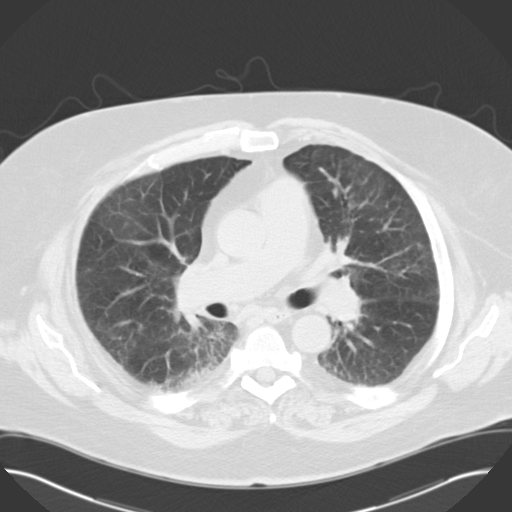
[im 40/64  lung]
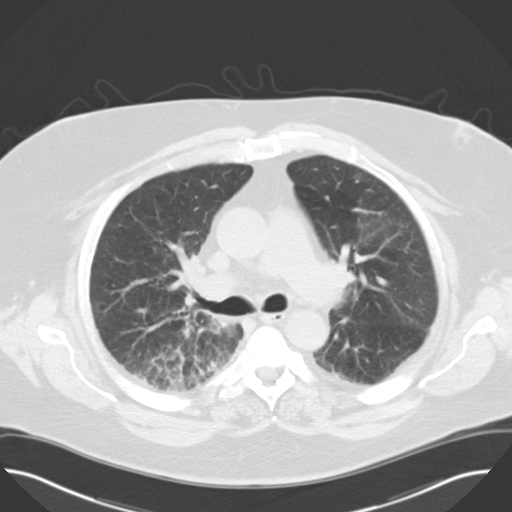
[im 45/64  lung]
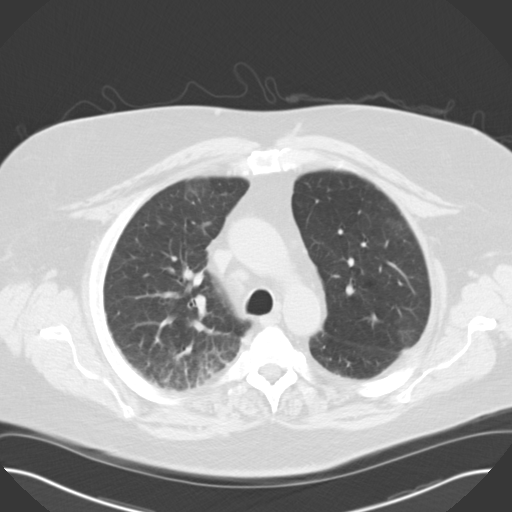
[im 50/64  lung]
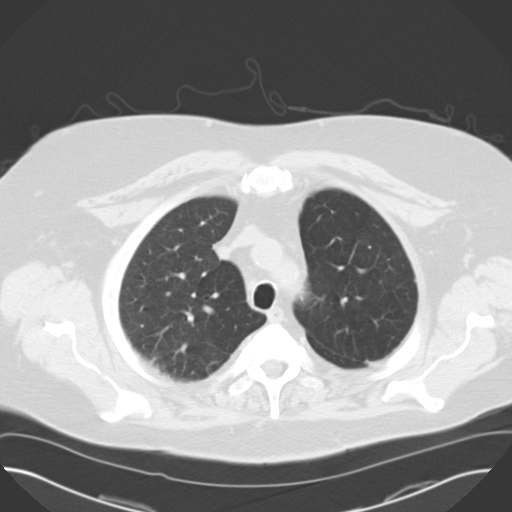
[im 52/64  mediastinal]
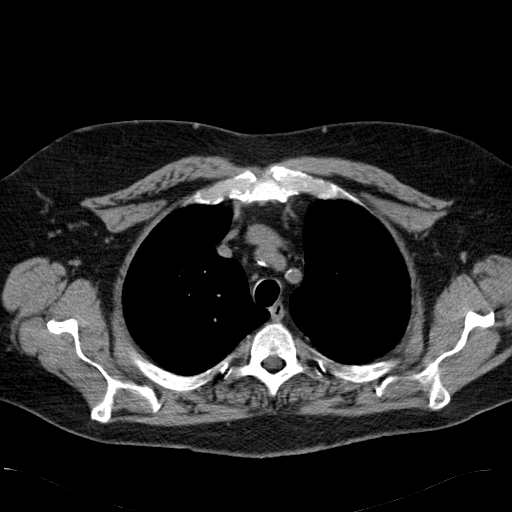
[im 52/64  lung]
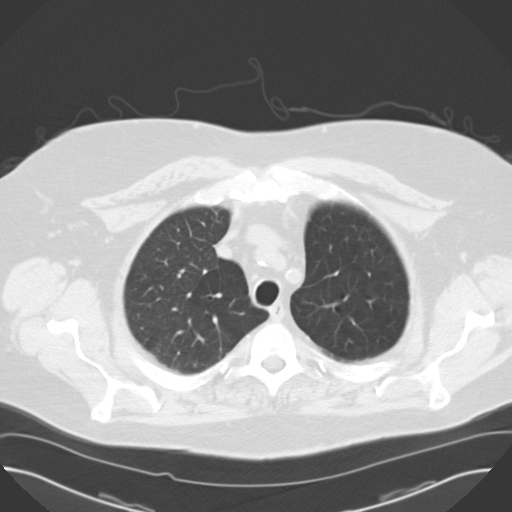
[im 57/64  lung]
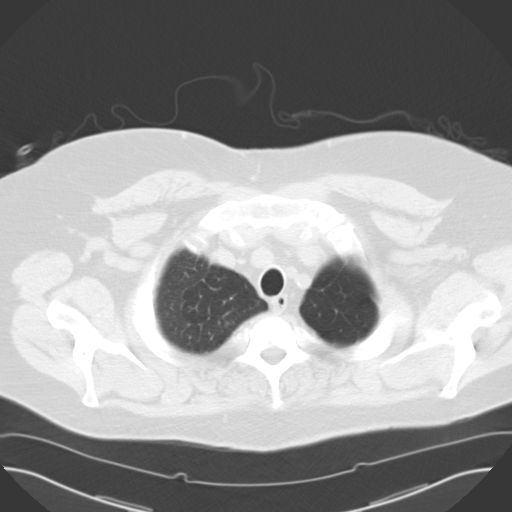
[im 61/64  lung]
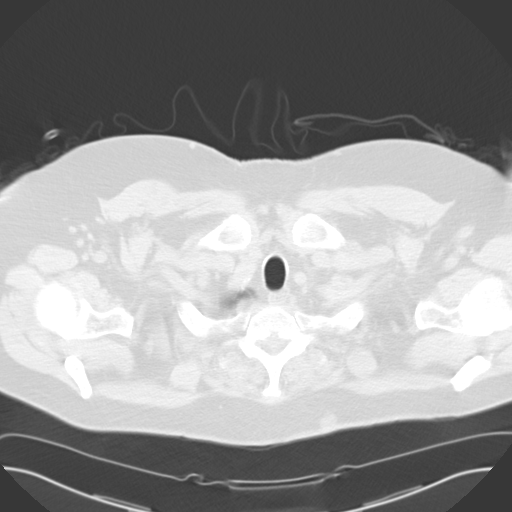

[15 of 31 positions shown; findings below may reference images not displayed]

FINDINGS: The central airways are patent. There is interval resolution of the
right upper lobe dense consolidation. There is no pulmonary mass or
concerning pulmonary nodules. There is a linear band of airspace
disease in the left upper lobe likely reflecting atelectasis. There
is a 15 mm opacity along the major fissure which may reflect
postinfectious/inflammatory scarring. There is dependent right upper
lobe and right lower lobe interstitial airspace disease which may
reflect an inflammatory or infectious etiology. There is no pleural
effusion or pneumothorax.

There are no pathologically enlarged axillary, hilar or mediastinal
lymph nodes.

The heart size is normal. There is coronary artery atherosclerosis
in the left main, lad, circumflex and RCA. There is no pericardial
effusion. The thoracic aorta is normal in caliber. There is thoracic
aortic atherosclerosis.

Review of bone windows demonstrates no focal lytic or sclerotic
lesions.

Limited non-contrast images of the upper abdomen were obtained. The
adrenal glands appear normal. The remainder of the visualized
abdominal organs are unremarkable.
IMPRESSION: 1. Interval resolution of the right upper lobe dense consolidation
without residual abnormality.
2. There is dependent right upper lobe and right lower lobe
interstitial airspace disease which may reflect an inflammatory or
infectious etiology.
3. There is a 15 mm opacity along the major fissure which may
reflect postinfectious/inflammatory scarring. Recommend follow-up CT
chest in 3 months.
4. Multivessel coronary artery disease.

## 2019-05-11 ENCOUNTER — Other Ambulatory Visit: Payer: Self-pay

## 2019-05-11 ENCOUNTER — Encounter: Payer: Self-pay | Admitting: Student in an Organized Health Care Education/Training Program

## 2019-05-11 ENCOUNTER — Ambulatory Visit
Payer: Medicare Other | Attending: Student in an Organized Health Care Education/Training Program | Admitting: Student in an Organized Health Care Education/Training Program

## 2019-05-11 VITALS — BP 106/78 | HR 62 | Temp 92.0°F | Ht 75.0 in | Wt 263.0 lb

## 2019-05-11 DIAGNOSIS — G894 Chronic pain syndrome: Secondary | ICD-10-CM

## 2019-05-11 NOTE — Progress Notes (Signed)
Safety precautions to be maintained throughout the outpatient stay will include: orient to surroundings, keep bed in low position, maintain call bell within reach at all times, provide assistance with transfer out of bed and ambulation.  

## 2019-05-11 NOTE — Progress Notes (Signed)
Patient not willing to continue waiting for appointment. Left.

## 2019-10-11 ENCOUNTER — Encounter: Payer: Self-pay | Admitting: Internal Medicine

## 2019-10-11 ENCOUNTER — Emergency Department: Payer: Medicare Other

## 2019-10-11 ENCOUNTER — Inpatient Hospital Stay
Admission: EM | Admit: 2019-10-11 | Discharge: 2019-10-18 | DRG: 314 | Disposition: A | Discharge status: Hospice | Payer: Medicare Other | Attending: Internal Medicine | Admitting: Internal Medicine

## 2019-10-11 ENCOUNTER — Other Ambulatory Visit: Payer: Self-pay

## 2019-10-11 DIAGNOSIS — G47419 Narcolepsy without cataplexy: Secondary | ICD-10-CM | POA: Diagnosis present

## 2019-10-11 DIAGNOSIS — D509 Iron deficiency anemia, unspecified: Secondary | ICD-10-CM | POA: Diagnosis present

## 2019-10-11 DIAGNOSIS — J9611 Chronic respiratory failure with hypoxia: Secondary | ICD-10-CM | POA: Diagnosis present

## 2019-10-11 DIAGNOSIS — K5900 Constipation, unspecified: Secondary | ICD-10-CM | POA: Diagnosis not present

## 2019-10-11 DIAGNOSIS — G894 Chronic pain syndrome: Secondary | ICD-10-CM | POA: Diagnosis present

## 2019-10-11 DIAGNOSIS — Z91018 Allergy to other foods: Secondary | ICD-10-CM

## 2019-10-11 DIAGNOSIS — I4891 Unspecified atrial fibrillation: Secondary | ICD-10-CM | POA: Diagnosis not present

## 2019-10-11 DIAGNOSIS — E440 Moderate protein-calorie malnutrition: Secondary | ICD-10-CM | POA: Diagnosis present

## 2019-10-11 DIAGNOSIS — R188 Other ascites: Secondary | ICD-10-CM | POA: Diagnosis present

## 2019-10-11 DIAGNOSIS — R601 Generalized edema: Secondary | ICD-10-CM | POA: Diagnosis present

## 2019-10-11 DIAGNOSIS — L03115 Cellulitis of right lower limb: Secondary | ICD-10-CM | POA: Diagnosis present

## 2019-10-11 DIAGNOSIS — R600 Localized edema: Secondary | ICD-10-CM | POA: Diagnosis not present

## 2019-10-11 DIAGNOSIS — Z955 Presence of coronary angioplasty implant and graft: Secondary | ICD-10-CM

## 2019-10-11 DIAGNOSIS — Z515 Encounter for palliative care: Secondary | ICD-10-CM

## 2019-10-11 DIAGNOSIS — I272 Pulmonary hypertension, unspecified: Secondary | ICD-10-CM | POA: Diagnosis not present

## 2019-10-11 DIAGNOSIS — K766 Portal hypertension: Secondary | ICD-10-CM | POA: Diagnosis present

## 2019-10-11 DIAGNOSIS — I509 Heart failure, unspecified: Secondary | ICD-10-CM

## 2019-10-11 DIAGNOSIS — I5033 Acute on chronic diastolic (congestive) heart failure: Secondary | ICD-10-CM | POA: Diagnosis present

## 2019-10-11 DIAGNOSIS — I959 Hypotension, unspecified: Secondary | ICD-10-CM

## 2019-10-11 DIAGNOSIS — I4821 Permanent atrial fibrillation: Secondary | ICD-10-CM | POA: Diagnosis present

## 2019-10-11 DIAGNOSIS — R0600 Dyspnea, unspecified: Secondary | ICD-10-CM | POA: Diagnosis not present

## 2019-10-11 DIAGNOSIS — L97919 Non-pressure chronic ulcer of unspecified part of right lower leg with unspecified severity: Secondary | ICD-10-CM | POA: Diagnosis present

## 2019-10-11 DIAGNOSIS — I130 Hypertensive heart and chronic kidney disease with heart failure and stage 1 through stage 4 chronic kidney disease, or unspecified chronic kidney disease: Secondary | ICD-10-CM | POA: Diagnosis present

## 2019-10-11 DIAGNOSIS — Z91030 Bee allergy status: Secondary | ICD-10-CM

## 2019-10-11 DIAGNOSIS — Z7901 Long term (current) use of anticoagulants: Secondary | ICD-10-CM

## 2019-10-11 DIAGNOSIS — I248 Other forms of acute ischemic heart disease: Secondary | ICD-10-CM | POA: Diagnosis present

## 2019-10-11 DIAGNOSIS — N179 Acute kidney failure, unspecified: Secondary | ICD-10-CM

## 2019-10-11 DIAGNOSIS — I950 Idiopathic hypotension: Secondary | ICD-10-CM | POA: Diagnosis present

## 2019-10-11 DIAGNOSIS — Z79899 Other long term (current) drug therapy: Secondary | ICD-10-CM

## 2019-10-11 DIAGNOSIS — I2729 Other secondary pulmonary hypertension: Secondary | ICD-10-CM | POA: Diagnosis present

## 2019-10-11 DIAGNOSIS — R0602 Shortness of breath: Secondary | ICD-10-CM

## 2019-10-11 DIAGNOSIS — Z7189 Other specified counseling: Secondary | ICD-10-CM

## 2019-10-11 DIAGNOSIS — I5082 Biventricular heart failure: Secondary | ICD-10-CM | POA: Diagnosis present

## 2019-10-11 DIAGNOSIS — R570 Cardiogenic shock: Secondary | ICD-10-CM

## 2019-10-11 DIAGNOSIS — N170 Acute kidney failure with tubular necrosis: Secondary | ICD-10-CM

## 2019-10-11 DIAGNOSIS — R531 Weakness: Secondary | ICD-10-CM | POA: Diagnosis not present

## 2019-10-11 DIAGNOSIS — E1143 Type 2 diabetes mellitus with diabetic autonomic (poly)neuropathy: Secondary | ICD-10-CM | POA: Diagnosis present

## 2019-10-11 DIAGNOSIS — I4510 Unspecified right bundle-branch block: Secondary | ICD-10-CM | POA: Diagnosis present

## 2019-10-11 DIAGNOSIS — Z7401 Bed confinement status: Secondary | ICD-10-CM

## 2019-10-11 DIAGNOSIS — Z20822 Contact with and (suspected) exposure to covid-19: Secondary | ICD-10-CM | POA: Diagnosis present

## 2019-10-11 DIAGNOSIS — M545 Low back pain: Secondary | ICD-10-CM | POA: Diagnosis not present

## 2019-10-11 DIAGNOSIS — N1832 Chronic kidney disease, stage 3b: Secondary | ICD-10-CM | POA: Diagnosis present

## 2019-10-11 DIAGNOSIS — J441 Chronic obstructive pulmonary disease with (acute) exacerbation: Secondary | ICD-10-CM | POA: Diagnosis present

## 2019-10-11 DIAGNOSIS — N049 Nephrotic syndrome with unspecified morphologic changes: Secondary | ICD-10-CM

## 2019-10-11 DIAGNOSIS — R3916 Straining to void: Secondary | ICD-10-CM | POA: Diagnosis not present

## 2019-10-11 DIAGNOSIS — I9589 Other hypotension: Secondary | ICD-10-CM | POA: Diagnosis not present

## 2019-10-11 DIAGNOSIS — J8410 Pulmonary fibrosis, unspecified: Secondary | ICD-10-CM | POA: Diagnosis present

## 2019-10-11 DIAGNOSIS — E785 Hyperlipidemia, unspecified: Secondary | ICD-10-CM | POA: Diagnosis present

## 2019-10-11 DIAGNOSIS — K7460 Unspecified cirrhosis of liver: Secondary | ICD-10-CM | POA: Diagnosis present

## 2019-10-11 DIAGNOSIS — G4733 Obstructive sleep apnea (adult) (pediatric): Secondary | ICD-10-CM | POA: Diagnosis present

## 2019-10-11 DIAGNOSIS — I2781 Cor pulmonale (chronic): Secondary | ICD-10-CM | POA: Diagnosis present

## 2019-10-11 DIAGNOSIS — J449 Chronic obstructive pulmonary disease, unspecified: Secondary | ICD-10-CM | POA: Diagnosis present

## 2019-10-11 DIAGNOSIS — Z6835 Body mass index (BMI) 35.0-35.9, adult: Secondary | ICD-10-CM

## 2019-10-11 DIAGNOSIS — D649 Anemia, unspecified: Secondary | ICD-10-CM | POA: Diagnosis not present

## 2019-10-11 DIAGNOSIS — I5032 Chronic diastolic (congestive) heart failure: Secondary | ICD-10-CM | POA: Diagnosis not present

## 2019-10-11 DIAGNOSIS — Z974 Presence of external hearing-aid: Secondary | ICD-10-CM

## 2019-10-11 DIAGNOSIS — Z87891 Personal history of nicotine dependence: Secondary | ICD-10-CM

## 2019-10-11 DIAGNOSIS — I872 Venous insufficiency (chronic) (peripheral): Secondary | ICD-10-CM | POA: Diagnosis present

## 2019-10-11 DIAGNOSIS — K7581 Nonalcoholic steatohepatitis (NASH): Secondary | ICD-10-CM

## 2019-10-11 DIAGNOSIS — I50810 Right heart failure, unspecified: Secondary | ICD-10-CM | POA: Diagnosis not present

## 2019-10-11 DIAGNOSIS — Z66 Do not resuscitate: Secondary | ICD-10-CM | POA: Diagnosis not present

## 2019-10-11 DIAGNOSIS — I878 Other specified disorders of veins: Secondary | ICD-10-CM | POA: Diagnosis present

## 2019-10-11 DIAGNOSIS — I081 Rheumatic disorders of both mitral and tricuspid valves: Secondary | ICD-10-CM | POA: Diagnosis present

## 2019-10-11 DIAGNOSIS — E669 Obesity, unspecified: Secondary | ICD-10-CM | POA: Diagnosis present

## 2019-10-11 DIAGNOSIS — Z885 Allergy status to narcotic agent status: Secondary | ICD-10-CM

## 2019-10-11 DIAGNOSIS — E8809 Other disorders of plasma-protein metabolism, not elsewhere classified: Secondary | ICD-10-CM | POA: Diagnosis not present

## 2019-10-11 DIAGNOSIS — I890 Lymphedema, not elsewhere classified: Secondary | ICD-10-CM

## 2019-10-11 DIAGNOSIS — I2510 Atherosclerotic heart disease of native coronary artery without angina pectoris: Secondary | ICD-10-CM | POA: Diagnosis present

## 2019-10-11 DIAGNOSIS — E1122 Type 2 diabetes mellitus with diabetic chronic kidney disease: Secondary | ICD-10-CM | POA: Diagnosis present

## 2019-10-11 DIAGNOSIS — N401 Enlarged prostate with lower urinary tract symptoms: Secondary | ICD-10-CM | POA: Diagnosis not present

## 2019-10-11 DIAGNOSIS — Z993 Dependence on wheelchair: Secondary | ICD-10-CM

## 2019-10-11 DIAGNOSIS — J301 Allergic rhinitis due to pollen: Secondary | ICD-10-CM | POA: Diagnosis present

## 2019-10-11 DIAGNOSIS — K5909 Other constipation: Secondary | ICD-10-CM | POA: Diagnosis present

## 2019-10-11 DIAGNOSIS — Z825 Family history of asthma and other chronic lower respiratory diseases: Secondary | ICD-10-CM

## 2019-10-11 LAB — TROPONIN I (HIGH SENSITIVITY)
Troponin I (High Sensitivity): 72 ng/L — ABNORMAL HIGH (ref <18)
Troponin I (High Sensitivity): 63 ng/L — ABNORMAL HIGH (ref <18)

## 2019-10-11 LAB — COMPREHENSIVE METABOLIC PANEL
ALT: 25 U/L (ref 0–44)
AST: 40 U/L (ref 15–41)
Albumin: 2.8 g/dL — ABNORMAL LOW (ref 3.5–5.0)
Alkaline Phosphatase: 101 U/L (ref 38–126)
Anion gap: 11 (ref 5–15)
BUN: 47 mg/dL — ABNORMAL HIGH (ref 8–23)
CO2: 27 mmol/L (ref 22–32)
Calcium: 8.6 mg/dL — ABNORMAL LOW (ref 8.9–10.3)
Chloride: 93 mmol/L — ABNORMAL LOW (ref 98–111)
Creatinine, Ser: 1.99 mg/dL — ABNORMAL HIGH (ref 0.61–1.24)
GFR calc Af Amer: 36 mL/min — ABNORMAL LOW (ref >60)
GFR calc non Af Amer: 31 mL/min — ABNORMAL LOW (ref >60)
Glucose, Bld: 95 mg/dL (ref 70–99)
Potassium: 4.6 mmol/L (ref 3.5–5.1)
Sodium: 131 mmol/L — ABNORMAL LOW (ref 135–145)
Total Bilirubin: 2.4 mg/dL — ABNORMAL HIGH (ref 0.3–1.2)
Total Protein: 5.8 g/dL — ABNORMAL LOW (ref 6.5–8.1)

## 2019-10-11 LAB — CBC WITH DIFFERENTIAL/PLATELET
Abs Immature Granulocytes: 0.02 10*3/uL (ref 0.00–0.07)
Basophils Absolute: 0 10*3/uL (ref 0.0–0.1)
Basophils Relative: 0 %
Eosinophils Absolute: 0 10*3/uL (ref 0.0–0.5)
Eosinophils Relative: 0 %
HCT: 28.4 % — ABNORMAL LOW (ref 39.0–52.0)
Hemoglobin: 8.4 g/dL — ABNORMAL LOW (ref 13.0–17.0)
Immature Granulocytes: 0 %
Lymphocytes Relative: 15 %
Lymphs Abs: 1 10*3/uL (ref 0.7–4.0)
MCH: 25.4 pg — ABNORMAL LOW (ref 26.0–34.0)
MCHC: 29.6 g/dL — ABNORMAL LOW (ref 30.0–36.0)
MCV: 85.8 fL (ref 80.0–100.0)
Monocytes Absolute: 0.6 10*3/uL (ref 0.1–1.0)
Monocytes Relative: 9 %
Neutro Abs: 5.2 10*3/uL (ref 1.7–7.7)
Neutrophils Relative %: 76 %
Platelets: 166 10*3/uL (ref 150–400)
RBC: 3.31 MIL/uL — ABNORMAL LOW (ref 4.22–5.81)
RDW: 21.8 % — ABNORMAL HIGH (ref 11.5–15.5)
Smear Review: NORMAL
WBC: 6.9 10*3/uL (ref 4.0–10.5)
nRBC: 0 % (ref 0.0–0.2)

## 2019-10-11 LAB — RESPIRATORY PANEL BY RT PCR (FLU A&B, COVID)
Influenza A by PCR: NEGATIVE
Influenza B by PCR: NEGATIVE
SARS Coronavirus 2 by RT PCR: NEGATIVE

## 2019-10-11 LAB — BRAIN NATRIURETIC PEPTIDE: B Natriuretic Peptide: 1906 pg/mL — ABNORMAL HIGH (ref 0.0–100.0)

## 2019-10-11 LAB — LACTIC ACID, PLASMA
Lactic Acid, Venous: 2.2 mmol/L (ref 0.5–1.9)
Lactic Acid, Venous: 1.6 mmol/L (ref 0.5–1.9)

## 2019-10-11 LAB — PREALBUMIN: Prealbumin: 6 mg/dL — ABNORMAL LOW (ref 18–38)

## 2019-10-11 LAB — PROTIME-INR
INR: 1.7 — ABNORMAL HIGH (ref 0.8–1.2)
Prothrombin Time: 19.4 seconds — ABNORMAL HIGH (ref 11.4–15.2)

## 2019-10-11 LAB — ALBUMIN: Albumin: 2.5 g/dL — ABNORMAL LOW (ref 3.5–5.0)

## 2019-10-11 LAB — LIPASE, BLOOD: Lipase: 30 U/L (ref 11–51)

## 2019-10-11 MED ORDER — SODIUM CHLORIDE 0.9 % IV BOLUS
500.0000 mL | Freq: Once | INTRAVENOUS | Status: AC
  Administered 2019-10-11: 14:00:00 500 mL via INTRAVENOUS

## 2019-10-11 MED ORDER — SODIUM CHLORIDE 0.9% FLUSH
3.0000 mL | Freq: Two times a day (BID) | INTRAVENOUS | Status: DC
  Administered 2019-10-11 – 2019-10-18 (×14): 3 mL via INTRAVENOUS

## 2019-10-11 MED ORDER — APIXABAN 5 MG PO TABS
5.0000 mg | ORAL_TABLET | Freq: Two times a day (BID) | ORAL | Status: DC
  Administered 2019-10-12 – 2019-10-18 (×13): 5 mg via ORAL
  Filled 2019-10-11 (×13): qty 1

## 2019-10-11 MED ORDER — ACETAMINOPHEN 325 MG PO TABS
650.0000 mg | ORAL_TABLET | ORAL | Status: DC | PRN
  Administered 2019-10-13: 650 mg via ORAL
  Filled 2019-10-11 (×3): qty 2

## 2019-10-11 MED ORDER — SODIUM CHLORIDE 0.9% FLUSH: 3.0000 mL | INTRAVENOUS | Status: DC | PRN

## 2019-10-11 MED ORDER — MODAFINIL 100 MG PO TABS
300.0000 mg | ORAL_TABLET | Freq: Three times a day (TID) | ORAL | Status: DC
  Administered 2019-10-12 (×2): 300 mg via ORAL
  Filled 2019-10-11 (×4): qty 3

## 2019-10-11 MED ORDER — MODAFINIL 200 MG PO TABS: 300.0000 mg | ORAL_TABLET | Freq: Three times a day (TID) | ORAL | Status: DC

## 2019-10-11 MED ORDER — SODIUM CHLORIDE 0.9 % IV SOLN: 250.0000 mL | INTRAVENOUS | Status: DC | PRN

## 2019-10-11 MED ORDER — DEXTROSE 5 % IV SOLN
6.0000 mg/h | INTRAVENOUS | Status: DC
  Administered 2019-10-11 (×2): 8 mg/h via INTRAVENOUS
  Filled 2019-10-11: qty 25

## 2019-10-11 MED ORDER — MODAFINIL 200 MG PO TABS
300.0000 mg | ORAL_TABLET | Freq: Every day | ORAL | Status: DC
  Administered 2019-10-11: 19:00:00 300 mg via ORAL
  Filled 2019-10-11: qty 1

## 2019-10-11 MED ORDER — MIDODRINE HCL 5 MG PO TABS
2.5000 mg | ORAL_TABLET | Freq: Three times a day (TID) | ORAL | Status: DC
  Administered 2019-10-11: 2.5 mg via ORAL
  Filled 2019-10-11: qty 1

## 2019-10-11 MED ORDER — DEXTROAMPHETAMINE SULFATE ER 5 MG PO CP24
15.0000 mg | ORAL_CAPSULE | Freq: Three times a day (TID) | ORAL | Status: DC
  Administered 2019-10-11 – 2019-10-12 (×2): 15 mg via ORAL
  Filled 2019-10-11 (×6): qty 3

## 2019-10-11 MED ORDER — ONDANSETRON HCL 4 MG/2ML IJ SOLN
4.0000 mg | Freq: Four times a day (QID) | INTRAMUSCULAR | Status: DC | PRN
  Administered 2019-10-13: 02:00:00 4 mg via INTRAVENOUS
  Filled 2019-10-11 (×2): qty 2

## 2019-10-11 NOTE — H&P (Signed)
Miguel Palmer NAME: Miguel Palmer    MR#:  [id number]  DATE OF BIRTH:  05-09-1941  DATE OF ADMISSION:  10/11/2019  PRIMARY CARE PHYSICIAN: [name], MD   REQUESTING/REFERRING PHYSICIAN: Carrie Mew, MD  Coming from [place]:   Chief Complaint  Patient presents with  . Hypotension    HISTORY OF PRESENT ILLNESS:  Miguel Palmer  is a 79 y.o. male with a known history of history of diabetes, COPD, CAD obesitybeing admitted for anasarca, hypotension. Patient was at her PCP office who noticed severe edema in LEs, hypotension and was sent over to ED.    Patient has hard time hearing (wears hearing aid and one of them was not in). Wife provides most history with patient permission. She reports that he was having trouble with constipation so he took laxatives and has been having diarrhea for the past week.  Additionally, he has chronic lymphedema with watery drainage from the right leg that he keeps wrapped in a trash bag to collect the fluid.  Denies chest pain or shortness of breath.  No fever. She reports patient having significant clinical decline over past few months. He's bed bound status and occasionally uses wheelchair to get around.  She is having real hard time taking care of him at home as he requires assistance for ADLs. PAST MEDICAL HISTORY:   Past Medical History:  Diagnosis Date  . Arteriosclerosis    7 stents placed no s/s x 7 years  . Biceps tendinitis 05/29/2014  . COPD (chronic obstructive pulmonary disease) ([place])   . Diabetes mellitus without complication ([place])   . Disorder of rotator cuff 05/29/2014  . Edema    bilateral lower extremities  . Frank hematuria 04/28/2013  . Memory change 01/13/2014  . Narcolepsy   . Obesity   . 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 Oxygen deficiency    PAST SURGICAL HISTORY:   Past Surgical History:  Procedure Laterality Date  . CORONARY STENT PLACEMENT    . KNEE  ARTHROSCOPY Right    SOCIAL HISTORY:   Social History   Tobacco Use  . Smoking status: Former Smoker    Packs/day: 2.00    Years: 22.00    Pack years: 44.00    Types: Cigarettes  . Smokeless tobacco: Never Used  Substance Use Topics  . Alcohol use: No    Alcohol/week: 0.0 standard drinks   FAMILY HISTORY:   Family History  Problem Relation Age of Onset  . Dementia Mother   . Asthma Father   . Emphysema Father   . Alzheimer's disease Father    DRUG ALLERGIES:   Allergies  Allergen Reactions  . Bee Pollen Shortness Of Breath    Post nasal drip  . Pollen Extract Shortness Of Breath    Post nasal drip Post nasal drip  . Codeine Other (See Comments)  . Other Other (See Comments)    pepper  . [place] Other (See Comments)  . Morphine Itching    (+) itching and redness noted to IV insertion site following administration. See ED nurses note from 11/25 at 0449. (+) itching and redness noted to IV insertion site following administration. See ED nurses note from 11/25 at 0449.   Marland Kitchen Morphine And Related Itching    (+) itching and redness noted to IV insertion site following administration. See ED nurses note from 11/25 at 0449.   REVIEW OF SYSTEMS:  Review of Systems  Constitutional: Positive for  malaise/fatigue. Negative for diaphoresis, fever and weight loss.  HENT: Positive for hearing loss. Negative for ear discharge, ear pain, nosebleeds, sore throat and tinnitus.   Eyes: Negative for blurred vision and pain.  Respiratory: Positive for shortness of breath. Negative for cough, hemoptysis and wheezing.   Cardiovascular: Positive for leg swelling. Negative for chest pain, palpitations and orthopnea.  Gastrointestinal: Negative for abdominal pain, blood in stool, constipation, diarrhea, heartburn, nausea and vomiting.  Genitourinary: Negative for dysuria, frequency and urgency.  Musculoskeletal: Negative for back pain and myalgias.  Skin: Negative for itching and rash.    Neurological: Negative for dizziness, tingling, tremors, focal weakness, seizures, weakness and headaches.  Psychiatric/Behavioral: Negative for depression. The patient is not nervous/anxious.    MEDICATIONS AT HOME:   Prior to Admission medications   Medication Sig Start Date End Date Taking? Authorizing Provider  acetaminophen (TYLENOL) 325 MG tablet Take 2 tablets (650 mg total) by mouth every 6 (six) hours as needed for mild pain (or Fever >/= 101). 06/27/16  Yes Gouru, Illene Silver, MD  ascorbic acid (VITAMIN C) 500 MG tablet Take 500 mg by mouth 3 (three) times daily.    Yes [provider]  chlorhexidine (PERIDEX) 0.12 % solution Use as directed 15 mLs in the mouth or throat 2 (two) times daily. After breakfast and before bedtime 09/05/19  Yes [provider]  Cholecalciferol (VITAMIN D3) 50 MCG (2000 UT) capsule Take 2,000 Units by mouth daily.   Yes [provider]  Coenzyme Q10 100 MG TABS Take 100 mg by mouth 4 (four) times daily.    Yes [provider]  dextroamphetamine (DEXEDRINE SPANSULE) 15 MG 24 hr capsule Take 1 capsule by mouth 3 (three) times daily.  05/29/16  Yes [provider]  DHA-PHOSPHATIDYLSERINE PO Take 100 mg by mouth in the morning, at noon, and at bedtime.    Yes [provider]  ELIQUIS 5 MG TABS tablet Take 5 mg by mouth 2 (two) times daily. 07/28/19  Yes [provider]  gabapentin (NEURONTIN) 400 MG capsule Take 400 mg by mouth 4 (four) times daily.   Yes [provider]  Ginkgo Biloba 60 MG TABS Take 60 mg by mouth 2 (two) times daily.    Yes [provider]  Glucosamine Sulfate 1000 MG CAPS Take 1,000 mg by mouth 2 (two) times daily.    Yes [provider]  guaiFENesin (MUCINEX) 600 MG 12 hr tablet Take 600 mg by mouth 2 (two) times daily as needed.    Yes [provider]  metoprolol succinate (TOPROL-XL) 50 MG 24 hr tablet Take 50 mg by mouth daily. TAKE 1 TABLET BY MOUTH  EVERY DAY 08/11/14  Yes [provider]  modafinil (PROVIGIL) 100 MG tablet Take 300 mg by mouth daily.  03/26/16  Yes [provider]  Multiple Vitamins-Minerals (MULTIVITAMIN & MINERAL PO) Take by mouth.   Yes [provider]  nitroGLYCERIN (NITROSTAT) 0.4 MG SL tablet Place 0.4 mg under the tongue every 5 (five) minutes as needed for chest pain.  10/09/13  Yes [provider]  PHOSPHATIDYLSERINE PO Take 60 mg by mouth 3 (three) times daily.   Yes [provider]  rosuvastatin (CRESTOR) 20 MG tablet Take 20 mg by mouth at bedtime.  10/02/14  Yes [provider]  tiotropium (SPIRIVA) 18 MCG inhalation capsule Place 1 capsule (18 mcg total) into inhaler and inhale daily. 06/28/16  Yes Gouru, Illene Silver, MD  torsemide (DEMADEX) 20 MG tablet Take  2 tablets (44m) by mouth once daily.   Yes [provider]    VITAL SIGNS:  Blood pressure (!) 88/65, pulse 84, temperature (!) 97.5 F (36.4 C), temperature source Oral, resp. rate 15, height 6' 3"  (1.905 m), weight 125.2 kg, SpO2 95 %. PHYSICAL EXAMINATION:  Physical Exam  GENERAL:  79y.o.-year-old patient lying in the bed with no acute distress. Has anasarca EYES: Pupils equal, round, reactive to light and accommodation. No scleral icterus. Extraocular muscles intact.  HEENT: Head atraumatic, normocephalic. Oropharynx and nasopharynx clear. Difficulty hearing NECK:  Supple, no jugular venous distention. No thyroid enlargement, no tenderness.  LUNGS: Normal breath sounds bilaterally, no wheezing, rales,rhonchi or crepitation. No use of accessory muscles of respiration.  CARDIOVASCULAR: S1, S2 normal. No murmurs, rubs, or gallops.  ABDOMEN: Soft, nontender, nondistended. Bowel sounds present. No organomegaly or mass.  EXTREMITIES: No cyanosis, or clubbing. Brawny lymphedema bilateral lower extremities.  Symmetric red/purplish discoloration of bilateral lower extremities.  Not hot to the touch, no  crepitus, no tenderness.  There are a few superficial skin wounds on the right lower leg which allow copious serous drainage. No obvious signs of infection. NEUROLOGIC: Cranial nerves II through XII are intact. Muscle strength 5/5 in all extremities. Sensation intact. Gait not checked.  PSYCHIATRIC: The patient is alert and oriented x 3.  SKIN: No obvious rash, lesion, or ulcer.  LABORATORY PANEL:   CBC Recent Labs  Lab 10/11/19 1249  WBC 6.9  HGB 8.4*  HCT 28.4*  PLT 166   ------------------------------------------------------------------------------------------------------------------  Chemistries  Recent Labs  Lab 10/11/19 1249  NA 131*  K 4.6  CL 93*  CO2 27  GLUCOSE 95  BUN 47*  CREATININE 1.99*  CALCIUM 8.6*  AST 40  ALT 25  ALKPHOS 101  BILITOT 2.4*   ------------------------------------------------------------------------------------------------------------------  Cardiac Enzymes No results for input(s): TROPONINI in the last 168 hours. ------------------------------------------------------------------------------------------------------------------  RADIOLOGY:  DG Chest Portable 1 View  Result Date: 10/11/2019 CLINICAL DATA:  Hypotension. Weakness. Congestive heart failure. EXAM: PORTABLE CHEST 1 VIEW COMPARISON:  Chest x-rays dated 06/24/2016 and 05/17/2016 FINDINGS: The heart size and pulmonary vascularity are normal. The azygos vein is slightly distended chronically which suggest elevated right heart pressure. There is slight chronic accentuation of the interstitial markings at the lung bases and in the mid zones. No acute infiltrates or effusions. No acute bone abnormality. IMPRESSION: 1. No acute abnormality. 2. Chronic interstitial disease. 3. Chronic distention of the azygos vein suggesting elevated right heart pressure. Electronically Signed   By: JLorriane ShireM.D.   On: 10/11/2019 15:10   IMPRESSION AND PLAN:  89y m with k/h/o pulmonary fibrosis,  diabetes, COPD, CAD obesityis being admitted for worsening anasarca and hypotension.   * Anasarca Likely multifactorial - poor nutrition, Cardiorenal syndrome - check albumin, prealbumin - start lasix drip as BP tolerates - c/s nutritionist  * Acute on chronic diastolic CHF - lasix drip as above - echo repeat, last echo in 2017 showed EF 55-60% Cardio c/s, strict I & Os, daily weights  * AKI - likely ATN from severe hypotension - cardio renal syndrome - nephro c/s  * Cardiogenic shock may need pressors while on lasix drip, echo, cardio c/s - consider albumin if nephro & cardio agrees  * Permanent Atrial fibrillation:  - cardio c/s, monitor on PCU - Eliquis for anticoagulation, rate is controlled  * Diabetes mellitus type II: Hold oral hyperglycemic agents. Sliding scale insulin while hospitalized. DM nurse c/s  *  Narcolepsy: Continue Provigil and Adderall  He is at high risk cardio-resp and multiorgan failure and high risk for death  Overall poor prognosis, c/s palliative care for [place]. May be Hospice appropriate or outpt PC    All the records are reviewed and case discussed with ED provider. Management plans discussed with the patient, family (Wife at bedside) and they are in agreement.  CODE STATUS: FULL CODE  TOTAL TIME (critical care) TAKING CARE OF THIS PATIENT: 45 minutes.    Max Sane M.D on 10/11/2019 at 5:06 PM  [facility]   CC: Primary care physician; [name], MD   Note: This dictation was prepared with Dragon dictation along with smaller phrase technology. Any transcriptional errors that result from this process are unintentional.

## 2019-10-11 NOTE — ED Provider Notes (Signed)
[facility] Emergency Department Provider Note  ____________________________________________  Time seen: Approximately 2:31 PM  I have reviewed the triage vital signs and the nursing notes.   HISTORY  Chief Complaint Hypotension    HPI Miguel Palmer is a 79 y.o. male with a history of diabetes COPD CAD obesity  who presents to the ED due to hypotension, sent from primary care.  The reported concern for sepsis.  Patient denies any focal symptoms, reports generalized weakness, gradual onset, constant, no aggravating or alleviating factors.  He notes that he was having trouble with constipation so he took laxatives and has been having diarrhea for the past week.  Additionally, he has chronic lymphedema with watery drainage from the right leg that he keeps wrapped in a trash bag to collect the fluid.  Denies chest pain or shortness of breath.  No fever.     Past Medical History:  Diagnosis Date  . Arteriosclerosis    7 stents placed no s/s x 7 years  . Biceps tendinitis 05/29/2014  . COPD (chronic obstructive pulmonary disease) ([place])   . Diabetes mellitus without complication ([place])   . Disorder of rotator cuff 05/29/2014  . Edema    bilateral lower extremities  . Frank hematuria 04/28/2013  . Memory change 01/13/2014  . Narcolepsy   . Obesity   . 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 Oxygen deficiency      Patient Active Problem List   Diagnosis Date Noted  . HCAP (healthcare-associated pneumonia) 06/24/2016  . Community acquired pneumonia 05/17/2016  . Chest pain 05/17/2016  . Diabetes mellitus without complication ([place]) 03/54/6568  . Chronic bilateral low back pain with right-sided sciatica 05/01/2016  . Long term current use of anticoagulant therapy 05/01/2016  . Chronic pain of right lower extremity 05/01/2016  . Spinal stenosis of lumbar region 05/01/2016  . Other spondylosis with radiculopathy, lumbar region 05/01/2016  .  Spondylolisthesis of lumbar region 05/01/2016  . Chronic pain syndrome 05/01/2016  . Hypoxemia requiring supplemental oxygen 12/27/2015  . Morbid obesity with BMI of 40.0-44.9, adult ([place]) 11/22/2015  . Chronic respiratory failure with hypoxia ([place]) 09/05/2015  . Pulmonary fibrosis, unspecified ([place]) 09/05/2015  . Restrictive airway disease 06/20/2015  . Chronic obstructive pulmonary disease ([place]) 04/01/2015  . Mixed hyperlipidemia 01/05/2015  . Non-ST elevation myocardial infarction (NSTEMI), subendocardial infarction, subsequent episode of care ([place]) 01/05/2015  . Benign essential hypertension 01/04/2015  . HLD (hyperlipidemia) 10/31/2014  . Heart attack ([place]) 10/31/2014  . Difficulty hearing 03/17/2014  . Abnormal gait 10/09/2013  . Obstructive apnea 08/30/2012  . 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 Prostate cancer screening 09/23/2011  . Vitamin D deficiency 09/23/2011  . Arteriosclerosis of coronary artery 09/04/2011  . CAD (coronary artery disease) 09/04/2011  . Allergic rhinitis 06/25/2011  . 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 Lower extremity weakness 03/30/2011  . Neuropathy ([place]) 01/17/2011     Past Surgical History:  Procedure Laterality Date  . CORONARY STENT PLACEMENT    . KNEE ARTHROSCOPY Right      Prior to Admission medications   Medication Sig Start Date End Date Taking? Authorizing Provider  acetaminophen (TYLENOL) 325 MG tablet Take 2 tablets (650 mg total) by mouth every 6 (six) hours as needed for mild pain (or Fever >/= 101). 06/27/16  Yes Gouru, Illene Silver, MD  ascorbic acid (VITAMIN C) 500 MG tablet Take 500 mg by mouth 3 (three) times daily.    Yes [provider]  chlorhexidine (PERIDEX) 0.12 % solution Use as  directed 15 mLs in the mouth or throat 2 (two) times daily. After breakfast and before bedtime 09/05/19  Yes [provider]  Cholecalciferol (VITAMIN D3) 50 MCG (2000 UT) capsule Take 2,000 Units by mouth daily.   Yes [provider]   Coenzyme Q10 100 MG TABS Take 100 mg by mouth 4 (four) times daily.    Yes [provider]  dextroamphetamine (DEXEDRINE SPANSULE) 15 MG 24 hr capsule Take 1 capsule by mouth 3 (three) times daily.  05/29/16  Yes [provider]  DHA-PHOSPHATIDYLSERINE PO Take 100 mg by mouth in the morning, at noon, and at bedtime.    Yes [provider]  ELIQUIS 5 MG TABS tablet Take 5 mg by mouth 2 (two) times daily. 07/28/19  Yes [provider]  gabapentin (NEURONTIN) 400 MG capsule Take 400 mg by mouth 4 (four) times daily.   Yes [provider]  GLUCOSAMINE HCL PO Take by mouth 4 (four) times daily.   Yes [provider]  Cinnamon 500 MG capsule Take 500 mg by mouth daily.     [provider]  [employer] OMEGA-3 KRILL OIL 300 MG CAPS Take 1 capsule by mouth daily.     [provider]  FLUZONE HIGH-DOSE 0.5 ML SUSY TO BE ADMINISTERED BY PHARMACIST FOR IMMUNIZATION 04/04/16   [provider]  Ginkgo Biloba 60 MG TABS Take 60 mg by mouth 2 (two) times daily.     [provider]  Glucosamine Sulfate 1000 MG CAPS Take 1,000 mg by mouth 2 (two) times daily.     [provider]  guaiFENesin (MUCINEX) 600 MG 12 hr tablet Take 600 mg by mouth 2 (two) times daily as needed.     [provider]  isosorbide mononitrate (IMDUR) 30 MG 24 hr tablet TAKE 1 TABLET BY MOUTH EVERY DAY 05/22/14   [provider]  levothyroxine (SYNTHROID) 25 MCG tablet Take 25 mcg by mouth daily. 10/06/19   [provider]  metFORMIN (GLUCOPHAGE) 500 MG tablet TAKE 1 TABLET BY MOUTH DAILY. 10/17/14   [provider]  metoprolol succinate (TOPROL-XL) 25 MG 24 hr tablet TAKE 1 TABLET BY MOUTH EVERY DAY 08/11/14   [provider]  modafinil (PROVIGIL) 200 MG tablet three times daily 03/26/16   [provider]  Multiple Vitamins-Minerals (MULTIVITAMIN & MINERAL PO) Take by mouth.    [provider]   niacin (NIASPAN) 1000 MG CR tablet Take 1,000 mg by mouth at bedtime.  10/03/14   [provider]  nitroGLYCERIN (NITROSTAT) 0.4 MG SL tablet Place 0.4 mg under the tongue every 5 (five) minutes as needed for chest pain.  10/09/13   [provider]  PHOSPHATIDYLSERINE PO Take 60 mg by mouth 3 (three) times daily.    [provider]  Potassium Citrate 99 MG CAPS Take by mouth.    [provider]  PROAIR HFA 108 [phone] Base) MCG/ACT inhaler Inhale 2 puffs into the lungs 4 (four) times daily as needed. 06/09/16   [provider]  rosuvastatin (CRESTOR) 20 MG tablet Take 20 mg by mouth at bedtime.  10/02/14   [provider]  spironolactone (ALDACTONE) 25 MG tablet Take 12.5 mg by mouth daily. 06/22/19   [provider]  tiotropium (SPIRIVA) 18 MCG inhalation capsule Place 1 capsule (18 mcg total) into inhaler and inhale daily. 06/28/16   Nicholes Mango, MD     Allergies Pollen extract, Codeine, Other, [place], Morphine, and Morphine and related  Family History  Problem Relation Age of Onset  . Dementia Mother   . Asthma Father   . Emphysema Father   . Alzheimer's disease Father     Social History Social History   Tobacco Use  . Smoking status: Former Smoker    Packs/day: 2.00    Years: 22.00    Pack years: 44.00    Types: Cigarettes  . Smokeless tobacco: Never Used  Substance Use Topics  . Alcohol use: No    Alcohol/week: 0.0 standard drinks  . Drug use: No    Review of Systems  Constitutional:   No fever or chills.  ENT:   No sore throat. No rhinorrhea. Cardiovascular:   No chest pain or syncope. Respiratory:   No dyspnea or cough. Gastrointestinal:   Negative for abdominal pain, vomiting and diarrhea.  Musculoskeletal: Chronic bilateral leg swelling All other systems reviewed and are negative except as documented above in ROS and HPI.  ____________________________________________   PHYSICAL EXAM:  VITAL  SIGNS: ED Triage Vitals  Enc Vitals Group     BP 10/11/19 1244 (!) 88/54     Pulse Rate 10/11/19 1244 80     Resp 10/11/19 1244 14     Temp 10/11/19 1244 (!) 97.5 F (36.4 C)     Temp Source 10/11/19 1244 Oral     SpO2 10/11/19 1244 100 %     Weight 10/11/19 1235 276 lb (125.2 kg)     Height 10/11/19 1235 6' 3"  (1.905 m)     Head Circumference --      Peak Flow --      Pain Score 10/11/19 1239 0     Pain Loc --      Pain Edu? --      Excl. in [place]? --     Vital signs reviewed, nursing assessments reviewed.   Constitutional:   Alert and oriented. Non-toxic appearance. Eyes:   Conjunctivae are normal. EOMI. PERRL. ENT      Head:   Normocephalic and atraumatic.      Nose: Normal      Mouth/Throat: Dry mucous membranes.      Neck:   No meningismus. Full ROM. Hematological/Lymphatic/Immunilogical:   No cervical lymphadenopathy. Cardiovascular:   RRR. Symmetric bilateral radial and DP pulses.  No murmurs. Cap refill less than 2 seconds. Respiratory:   Normal respiratory effort without tachypnea/retractions. Breath sounds are clear and equal bilaterally. No wheezes/rales/rhonchi. Gastrointestinal:   Soft and nontender. Non distended. There is no CVA tenderness.  No rebound, rigidity, or guarding.  Rectal exam shows brown stool, Hemoccult negative  Musculoskeletal:   Normal range of motion in all extremities. No joint effusions.  No lower extremity tenderness.  Brawny lymphedema bilateral lower extremities consistent with venous stasis disease.  Symmetric red/purplish discoloration of bilateral lower extremities.  Not hot to the touch, no crepitus, no tenderness.  There are a few superficial skin wounds on the right lower leg which allow copious serous drainage.. Neurologic:   Normal speech and language.  Motor grossly intact. No acute focal neurologic deficits are appreciated.  Skin:    Skin is warm, dry with skin findings as above. No rash noted.  No petechiae, purpura, or  bullae.  ____________________________________________    LABS (pertinent positives/negatives) (all labs ordered are listed, but only abnormal results are displayed) Labs Reviewed  COMPREHENSIVE METABOLIC PANEL - Abnormal; Notable for the following components:      Result Value   Sodium 131 (*)    Chloride  93 (*)    BUN 47 (*)    Creatinine, Ser 1.99 (*)    Calcium 8.6 (*)    Total Protein 5.8 (*)    Albumin 2.8 (*)    Total Bilirubin 2.4 (*)    GFR calc non Af Amer 31 (*)    GFR calc Af Amer 36 (*)    All other components within normal limits  BRAIN NATRIURETIC PEPTIDE - Abnormal; Notable for the following components:   B Natriuretic Peptide 1,906.0 (*)    All other components within normal limits  CBC WITH DIFFERENTIAL/PLATELET - Abnormal; Notable for the following components:   RBC 3.31 (*)    Hemoglobin 8.4 (*)    HCT 28.4 (*)    MCH 25.4 (*)    MCHC 29.6 (*)    RDW 21.8 (*)    All other components within normal limits  PROTIME-INR - Abnormal; Notable for the following components:   Prothrombin Time 19.4 (*)    INR 1.7 (*)    All other components within normal limits  LACTIC ACID, PLASMA - Abnormal; Notable for the following components:   Lactic Acid, Venous 2.2 (*)    All other components within normal limits  TROPONIN I (HIGH SENSITIVITY) - Abnormal; Notable for the following components:   Troponin I (High Sensitivity) 72 (*)    All other components within normal limits  CULTURE, BLOOD (SINGLE)  CULTURE, BLOOD (SINGLE)  LIPASE, BLOOD  LACTIC ACID, PLASMA   ____________________________________________   EKG  Interpreted by me Atrial fibrillation, rate of 83.  Rightward axis.  Right bundle branch block.  No acute ischemic changes.  ____________________________________________    RADIOLOGY  No results found.  ____________________________________________   PROCEDURES .Critical Care Performed by: Carrie Mew, MD Authorized by: Carrie Mew, MD   Critical care provider statement:    Critical care time (minutes):  33   Critical care time was exclusive of:  Separately billable procedures and treating other patients   Critical care was necessary to treat or prevent imminent or life-threatening deterioration of the following conditions:  Dehydration, renal failure and shock   Critical care was time spent personally by me on the following activities:  Development of treatment plan with patient or surrogate, discussions with consultants, evaluation of patient's response to treatment, examination of patient, obtaining history from patient or surrogate, ordering and performing treatments and interventions, ordering and review of laboratory studies, ordering and review of radiographic studies, pulse oximetry, re-evaluation of patient's condition and review of old charts    ____________________________________________  DIFFERENTIAL DIAGNOSIS   Dehydration, CHF exacerbation, electrolyte abnormality, renal failure  CLINICAL IMPRESSION / ASSESSMENT AND PLAN / ED COURSE  Medications ordered in the ED: Medications  sodium chloride 0.9 % bolus 500 mL (500 mLs Intravenous New Bag/Given 10/11/19 1348)    Pertinent labs & imaging results that were available during my care of the patient were reviewed by me and considered in my medical decision making (see chart for details).  Miguel Palmer was evaluated in Emergency Department on 10/11/2019 for the symptoms described in the history of present illness. He was evaluated in the context of the global COVID-19 pandemic, which necessitated consideration that the patient might be at risk for infection with the SARS-CoV-2 virus that causes COVID-19. Institutional protocols and algorithms that pertain to the evaluation of patients at risk for COVID-19 are in a state of rapid change based on information released by regulatory bodies including the CDC and federal and state  organizations. These  policies and algorithms were followed during the patient's care in the ED.   Echocardiogram from 2017 reviewed which showed normal EF at that time 4 years ago  Clinical Course as of Oct 11 1435  Tue Oct 11, 2019  1308 Patient presents with weeping leg wounds which appear to be due to chronic CHF, peripheral edema, venous stasis disease.  Also reports diarrhea recently.  Clinically appears dehydrated.  Other vital signs are normal, no suspected source of infection.  He is not septic.  We will continue IV fluids for hydration and check his labs.   [PS]    Clinical Course User Index [PS] Carrie Mew, MD    ----------------------------------------- 2:36 PM on 10/11/2019 -----------------------------------------  Lactate of 2.2.  Patient reassessed, still doubt sepsis.  Blood pressure is normalized with IV fluids.  Due to the patient's clinically likely CHF, will withhold additional fluids and be cautious with volume expansion. other vital signs remain normal.  Case discussed with hospitalist for further evaluation.  Will obtain chest x-ray, Covid screening.  Notably, labs show AKI, low albumin of 2.8, anemia with a hemoglobin of 8.4 compared to baseline of 12.  BNP is elevated, troponin slightly elevated which I think are likely chronic baseline, will defer further cardiac assessment to hospitalist and lab trending.  ____________________________________________   FINAL CLINICAL IMPRESSION(S) / ED DIAGNOSES    Final diagnoses:  Hypotension, unspecified hypotension type  Acute on chronic congestive heart failure, unspecified heart failure type ([place])  AKI (acute kidney injury) [facility])     ED Discharge Orders    None      Portions of this note were generated with dragon dictation software. Dictation errors may occur despite best attempts at proofreading.   Carrie Mew, MD 10/11/19 [phone]

## 2019-10-11 NOTE — ED Triage Notes (Signed)
BIBA from [facility] for hypotension, concern for sepsis. Reported multiple open wounds to RLE, diarrhea PTA. Initial BP 80/50, improved to 92/60 after 731m NS. Patient arrives awake alert in NAD.

## 2019-10-11 NOTE — ED Notes (Signed)
ED TO INPATIENT HANDOFF REPORT  ED Nurse Name and Phone #: Anda Kraft [phone]  S Name/Age/Gender Miguel Palmer 79 y.o. male Room/Bed: ED05A/ED05A  Code Status   Code Status: Full Code  Home/SNF/Other Home Patient oriented to: self, place, time and situation Is this baseline? Yes   Triage Complete: Triage complete  Chief Complaint CHF (congestive heart failure) ([place]) [I50.9]  Triage Note BIBA from [facility] for hypotension, concern for sepsis. Reported multiple open wounds to RLE, diarrhea PTA. Initial BP 80/50, improved to 92/60 after 732m NS. Patient arrives awake alert in NAD.     Allergies Allergies  Allergen Reactions  . Bee Pollen Shortness Of Breath    Post nasal drip  . Pollen Extract Shortness Of Breath    Post nasal drip Post nasal drip  . Codeine Other (See Comments)  . Other Other (See Comments)    pepper  . Piper Other (See Comments)  . Morphine Itching    (+) itching and redness noted to IV insertion site following administration. See ED nurses note from 11/25 at 0449. (+) itching and redness noted to IV insertion site following administration. See ED nurses note from 11/25 at 0449.   .Marland KitchenMorphine And Related Itching    (+) itching and redness noted to IV insertion site following administration. See ED nurses note from 11/25 at 0449.    Level of Care/Admitting Diagnosis ED Disposition    [place]  Level of Care: Progressive Cardiac [106]  Admit to Progressive based on following criteria: MULTISYSTEM THREATS such as stable sepsis, metabolic/electrolyte imbalance with or without encephalopathy that is responding to early treatment.  Covid Evaluation: Asymptomatic Screening Protocol (No Symptoms)  Diagnosis: CHF (congestive heart failure) ([facility]) [[id number]] Admitting Physician: SMax Sane[[[id number] Attending Physician: SMax Sane[[[id number] Estimated length  of stay: past midnight tomorrow  Certification:: I certify this patient will need inpatient services for at least 2 midnights       B Medical/Surgery History Past Medical History:  Diagnosis Date  . Arteriosclerosis    7 stents placed no s/s x 7 years  . Biceps tendinitis 05/29/2014  . [place] disease[place]   . [place] complication [place]   . Disorder of rotator cuff 05/29/2014  . Edema    bilateral lower extremities  . Frank hematuria 04/28/2013  . Memory change 01/13/2014  . Narcolepsy   . Obesity   . 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 Oxygen deficiency    Past Surgical History:  Procedure Laterality Date  . CORONARY STENT PLACEMENT    . KNEE ARTHROSCOPY Right      A IV Location/Drains/Wounds Patient Lines/Drains/Airways Status   Active Line/Drains/Airways    Name:   Placement date:   Placement time:   Site:   Days:   Peripheral IV 10/11/19 Right Wrist   10/11/19    1250    Wrist   less than 1          Intake/Output Last 24 hours  Intake/Output Summary (Last 24 hours) at 10/11/2019 2004 Last data filed at 10/11/2019 1736 Gross per 24 hour  Intake 1200 ml  Output 325 ml  Net 875 ml    Labs/Imaging Results for orders placed or performed during the hospital encounter of 10/11/19 (from the past 48 hour(s))  Comprehensive metabolic panel     Status: Abnormal   Collection Time: 10/11/19 12:49  PM  Result Value Ref Range   Sodium 131 (L) 135 - 145 mmol/L   Potassium 4.6 3.5 - 5.1 mmol/L   Chloride 93 (L) 98 - 111 mmol/L   CO2 27 22 - 32 mmol/L   Glucose, Bld 95 70 - 99 mg/dL    Comment: Glucose reference range applies only to samples taken after fasting for at least 8 hours.   BUN 47 (H) 8 - 23 mg/dL   Creatinine, Ser 1.99 (H) 0.61 - 1.24 mg/dL   Calcium 8.6 (L) 8.9 - 10.3 mg/dL   Total Protein 5.8 (L) 6.5 - 8.1 g/dL   Albumin 2.8 (L) 3.5 - 5.0 g/dL   AST 40 15 - 41 U/L   ALT 25 0 - 44 U/L   Alkaline  Phosphatase 101 38 - 126 U/L   Total Bilirubin 2.4 (H) 0.3 - 1.2 mg/dL   GFR calc non Af Amer 31 (L) >60 mL/min   GFR calc Af Amer 36 (L) >60 mL/min   Anion gap 11 5 - 15    Comment: Performed at [facility], [address] [place], [place] [address]  Lipase, blood     Status: None   Collection Time: 10/11/19 12:49 PM  Result Value Ref Range   Lipase 30 11 - 51 U/L    Comment: Performed at [facility], [place], [place] [address]  Troponin I (High Sensitivity)     Status: Abnormal   Collection Time: 10/11/19 12:49 PM  Result Value Ref Range   Troponin I (High Sensitivity) 72 (H) <18 ng/L    Comment: (NOTE) Elevated high sensitivity troponin I (hsTnI) values and significant  changes across serial measurements may suggest ACS but many other  chronic and acute conditions are known to elevate hsTnI results.  Refer to the "Links" section for chest pain algorithms and additional  guidance. Performed at [facility], [address] [place], [place] [address]   Brain natriuretic peptide     Status: Abnormal   Collection Time: 10/11/19 12:49 PM  Result Value Ref Range   B Natriuretic Peptide 1,906.0 (H) 0.0 - 100.0 pg/mL    Comment: Performed at [facility], [address] [place],  [address]  CBC with Differential     Status: Abnormal   Collection Time: 10/11/19 12:49 PM  Result Value Ref Range   WBC 6.9 4.0 - 10.5 K/uL   RBC 3.31 (L) 4.22 - 5.81 MIL/uL   Hemoglobin 8.4 (L) 13.0 - 17.0 g/dL   HCT 28.4 (L) 39.0 - 52.0 %   MCV 85.8 80.0 - 100.0 fL   MCH 25.4 (L) 26.0 - 34.0 pg   MCHC 29.6 (L) 30.0 - 36.0 g/dL   RDW 21.8 (H) 11.5 - 15.5 %   Platelets 166 150 - 400 K/uL   nRBC 0.0 0.0 - 0.2 %   Neutrophils Relative % 76 %   Neutro Abs 5.2 1.7 - 7.7 K/uL   Lymphocytes Relative 15 %   Lymphs Abs 1.0 0.7 - 4.0 K/uL   Monocytes Relative 9 %   Monocytes Absolute 0.6 0.1 - 1.0 K/uL   Eosinophils Relative 0 %    Eosinophils Absolute 0.0 0.0 - 0.5 K/uL   Basophils Relative 0 %   Basophils Absolute 0.0 0.0 - 0.1 K/uL   WBC Morphology MORPHOLOGY UNREMARKABLE    RBC Morphology MORPHOLOGY UNREMARKABLE    Smear Review Normal platelet morphology    Immature Granulocytes 0 %  Abs Immature Granulocytes 0.02 0.00 - 0.07 K/uL    Comment: Performed at [facility], [address] [place], [place] [address]  Protime-INR     Status: Abnormal   Collection Time: 10/11/19 12:49 PM  Result Value Ref Range   Prothrombin Time 19.4 (H) 11.4 - 15.2 seconds   INR 1.7 (H) 0.8 - 1.2    Comment: (NOTE) INR goal varies based on device and disease states. Performed at [facility], [address] [place], [place] [address]   Lactic acid, plasma     Status: Abnormal   Collection Time: 10/11/19 12:49 PM  Result Value Ref Range   Lactic Acid, Venous 2.2 (HH) 0.5 - 1.9 mmol/L    Comment: CRITICAL RESULT CALLED TO, READ BACK BY AND VERIFIED WITH Brodhead PEARE @ 1400 10/11/19 RH/DAS Performed at [facility], [address] [place], [place] [address]   Lactic acid, plasma     Status: None   Collection Time: 10/11/19  3:07 PM  Result Value Ref Range   Lactic Acid, Venous 1.6 0.5 - 1.9 mmol/L    Comment: Performed at [facility], [place], [place] [address]  Troponin I (High Sensitivity)     Status: Abnormal   Collection Time: 10/11/19  3:07 PM  Result Value Ref Range   Troponin I (High Sensitivity) 63 (H) <18 ng/L    Comment: (NOTE) Elevated high sensitivity troponin I (hsTnI) values and significant  changes across serial measurements may suggest ACS but many other  chronic and acute conditions are known to elevate hsTnI results.  Refer to the "Links" section for chest pain algorithms and additional  guidance. Performed at [facility], [address] [place], [place] [address]   Albumin     Status: Abnormal   Collection Time: 10/11/19  3:07 PM   Result Value Ref Range   Albumin 2.5 (L) 3.5 - 5.0 g/dL    Comment: Performed at [facility], [address] [place], [place] [address]  Respiratory Panel by RT PCR (Flu A&B, Covid) - Nasopharyngeal Swab     Status: None   Collection Time: 10/11/19  5:06 PM   Specimen: Nasopharyngeal Swab  Result Value Ref Range   SARS Coronavirus 2 by RT PCR NEGATIVE NEGATIVE    Comment: (NOTE) SARS-CoV-2 target nucleic acids are NOT DETECTED. The SARS-CoV-2 RNA is generally detectable in upper respiratoy specimens during the acute phase of infection. The lowest concentration of SARS-CoV-2 viral copies this assay can detect is 131 copies/mL. A negative result does not preclude SARS-Cov-2 infection and should not be used as the sole basis for treatment or other patient management decisions. A negative result may occur with  improper specimen collection/handling, submission of specimen other than nasopharyngeal swab, presence of viral mutation(s) within the areas targeted by this assay, and inadequate number of viral copies (<131 copies/mL). A negative result must be combined with clinical observations, patient history, and epidemiological information. The expected result is Negative. Fact Sheet for Patients:  PinkCheek.be Fact Sheet for Healthcare Providers:  GravelBags.it This test is not yet ap proved or cleared by the [place] FDA and  has been authorized for detection and/or diagnosis of SARS-CoV-2 by FDA under an Emergency Use Authorization (EUA). This EUA will remain  in effect (meaning this test can be used) for the duration of the COVID-19 declaration under Section 564(b)(1) of the Act, 21 U.S.C. section 360bbb-3(b)(1), unless the authorization is terminated or revoked sooner.  Influenza A by PCR NEGATIVE NEGATIVE   Influenza B by PCR NEGATIVE NEGATIVE    Comment: (NOTE) The Xpert Xpress SARS-CoV-2/FLU/RSV assay  is intended as an aid in  the diagnosis of influenza from Nasopharyngeal swab specimens and  should not be used as a sole basis for treatment. Nasal washings and  aspirates are unacceptable for Xpert Xpress SARS-CoV-2/FLU/RSV  testing. Fact Sheet for Patients: PinkCheek.be Fact Sheet for Healthcare Providers: GravelBags.it This test is not yet approved or cleared by the [place] FDA and  has been authorized for detection and/or diagnosis of SARS-CoV-2 by  FDA under an Emergency Use Authorization (EUA). This EUA will remain  in effect (meaning this test can be used) for the duration of the  Covid-19 declaration under Section 564(b)(1) of the Act, 21  U.S.C. section 360bbb-3(b)(1), unless the authorization is  terminated or revoked. Performed at [facility], [address] [place], [place] [address]    DG Chest Portable 1 View  Result Date: 10/11/2019 CLINICAL DATA:  Hypotension. Weakness. Congestive heart failure. EXAM: PORTABLE CHEST 1 VIEW COMPARISON:  Chest x-rays dated 06/24/2016 and 05/17/2016 FINDINGS: The heart size and pulmonary vascularity are normal. The azygos vein is slightly distended chronically which suggest elevated right heart pressure. There is slight chronic accentuation of the interstitial markings at the lung bases and in the mid zones. No acute infiltrates or effusions. No acute bone abnormality. IMPRESSION: 1. No acute abnormality. 2. Chronic interstitial disease. 3. Chronic distention of the azygos vein suggesting elevated right heart pressure. Electronically Signed   By: Lorriane Shire M.D.   On: 10/11/2019 15:10    Pending Labs Unresulted Labs (From admission, onward)    Start     Ordered   10/12/19 9811  Basic metabolic panel  Daily,   STAT     10/11/19 1522   10/11/19 1730  Prealbumin  Add-on,   AD     10/11/19 1730   10/11/19 1351  Blood culture (single)  ONCE - STAT,   STAT     Comments: 2nd set    10/11/19 1350   10/11/19 1313  Blood culture (single)  ONCE - STAT,   STAT     10/11/19 1312          Vitals/Pain Today's Vitals   10/11/19 1915 10/11/19 1936 10/11/19 1945 10/11/19 2000  BP: 91/74 (!) 87/67 96/72 99/85   Pulse: 79 86  82  Resp: 13 20 16 19   Temp:      TempSrc:      SpO2:  94%  95%  Weight:      Height:      PainSc:        Isolation Precautions No active isolations  Medications Medications  apixaban (ELIQUIS) tablet 5 mg (has no administration in time range)  dextroamphetamine (DEXEDRINE SPANSULE) 24 hr capsule 15 mg (15 mg Oral Given 10/11/19 1849)  sodium chloride flush (NS) 0.9 % injection 3 mL (3 mLs Intravenous Not Given 10/11/19 1535)  sodium chloride flush (NS) 0.9 % injection 3 mL (has no administration in time range)  0.9 %  sodium chloride infusion (has no administration in time range)  acetaminophen (TYLENOL) tablet 650 mg (has no administration in time range)  ondansetron (ZOFRAN) injection 4 mg (has no administration in time range)  furosemide (LASIX) 250 mg in dextrose 5 % 250 mL (1 mg/mL) infusion (8 mg/hr Intravenous New Bag/Given 10/11/19 1709)  modafinil (PROVIGIL) tablet 300 mg (has no administration in time range)  sodium chloride 0.9 % bolus 500 mL (0 mLs Intravenous Stopped 10/11/19 1507)    Mobility non-ambulatory Low fall risk   Focused Assessments Pulmonary Assessment Handoff:  Lung sounds:   O2 Device: Room Air O2 Flow Rate (L/min): 2 L/min      R Recommendations: See Admitting Provider Note  Report given to:   Additional Notes:

## 2019-10-12 ENCOUNTER — Inpatient Hospital Stay: Payer: Medicare Other

## 2019-10-12 DIAGNOSIS — Z66 Do not resuscitate: Secondary | ICD-10-CM

## 2019-10-12 DIAGNOSIS — E440 Moderate protein-calorie malnutrition: Secondary | ICD-10-CM | POA: Diagnosis present

## 2019-10-12 DIAGNOSIS — I9589 Other hypotension: Secondary | ICD-10-CM

## 2019-10-12 DIAGNOSIS — I5032 Chronic diastolic (congestive) heart failure: Secondary | ICD-10-CM

## 2019-10-12 DIAGNOSIS — I509 Heart failure, unspecified: Secondary | ICD-10-CM

## 2019-10-12 DIAGNOSIS — I959 Hypotension, unspecified: Secondary | ICD-10-CM | POA: Diagnosis present

## 2019-10-12 DIAGNOSIS — Z7189 Other specified counseling: Secondary | ICD-10-CM

## 2019-10-12 LAB — CBC WITH DIFFERENTIAL/PLATELET
Abs Immature Granulocytes: 0.03 10*3/uL (ref 0.00–0.07)
Basophils Absolute: 0 10*3/uL (ref 0.0–0.1)
Basophils Relative: 0 %
Eosinophils Absolute: 0 10*3/uL (ref 0.0–0.5)
Eosinophils Relative: 0 %
HCT: 25.9 % — ABNORMAL LOW (ref 39.0–52.0)
Hemoglobin: 7.7 g/dL — ABNORMAL LOW (ref 13.0–17.0)
Immature Granulocytes: 0 %
Lymphocytes Relative: 14 %
Lymphs Abs: 1.1 10*3/uL (ref 0.7–4.0)
MCH: 25.4 pg — ABNORMAL LOW (ref 26.0–34.0)
MCHC: 29.7 g/dL — ABNORMAL LOW (ref 30.0–36.0)
MCV: 85.5 fL (ref 80.0–100.0)
Monocytes Absolute: 0.7 10*3/uL (ref 0.1–1.0)
Monocytes Relative: 9 %
Neutro Abs: 5.8 10*3/uL (ref 1.7–7.7)
Neutrophils Relative %: 77 %
Platelets: 153 10*3/uL (ref 150–400)
RBC: 3.03 MIL/uL — ABNORMAL LOW (ref 4.22–5.81)
RDW: 21.6 % — ABNORMAL HIGH (ref 11.5–15.5)
Smear Review: NORMAL
WBC: 7.6 10*3/uL (ref 4.0–10.5)
nRBC: 0 % (ref 0.0–0.2)

## 2019-10-12 LAB — BASIC METABOLIC PANEL
Anion gap: 11 (ref 5–15)
BUN: 41 mg/dL — ABNORMAL HIGH (ref 8–23)
CO2: 30 mmol/L (ref 22–32)
Calcium: 8.5 mg/dL — ABNORMAL LOW (ref 8.9–10.3)
Chloride: 93 mmol/L — ABNORMAL LOW (ref 98–111)
Creatinine, Ser: 1.73 mg/dL — ABNORMAL HIGH (ref 0.61–1.24)
GFR calc Af Amer: 43 mL/min — ABNORMAL LOW (ref >60)
GFR calc non Af Amer: 37 mL/min — ABNORMAL LOW (ref >60)
Glucose, Bld: 107 mg/dL — ABNORMAL HIGH (ref 70–99)
Potassium: 4.1 mmol/L (ref 3.5–5.1)
Sodium: 134 mmol/L — ABNORMAL LOW (ref 135–145)

## 2019-10-12 LAB — MRSA PCR SCREENING: MRSA by PCR: NEGATIVE

## 2019-10-12 LAB — GLUCOSE, CAPILLARY: Glucose-Capillary: 121 mg/dL — ABNORMAL HIGH (ref 70–99)

## 2019-10-12 LAB — CORTISOL: Cortisol, Plasma: 13.3 ug/dL

## 2019-10-12 LAB — PROCALCITONIN: Procalcitonin: 0.44 ng/mL

## 2019-10-12 LAB — PHOSPHORUS: Phosphorus: 3.5 mg/dL (ref 2.5–4.6)

## 2019-10-12 LAB — MAGNESIUM: Magnesium: 2.5 mg/dL — ABNORMAL HIGH (ref 1.7–2.4)

## 2019-10-12 LAB — LACTIC ACID, PLASMA: Lactic Acid, Venous: 1.9 mmol/L (ref 0.5–1.9)

## 2019-10-12 MED ORDER — MIDODRINE HCL 5 MG PO TABS
10.0000 mg | ORAL_TABLET | Freq: Three times a day (TID) | ORAL | Status: DC
  Administered 2019-10-13 – 2019-10-15 (×7): 10 mg via ORAL
  Filled 2019-10-12 (×7): qty 2

## 2019-10-12 MED ORDER — VANCOMYCIN HCL 2000 MG/400ML IV SOLN
2000.0000 mg | Freq: Once | INTRAVENOUS | Status: AC
  Administered 2019-10-12: 2000 mg via INTRAVENOUS
  Filled 2019-10-12: qty 400

## 2019-10-12 MED ORDER — TIOTROPIUM BROMIDE MONOHYDRATE 18 MCG IN CAPS
18.0000 ug | ORAL_CAPSULE | Freq: Every day | RESPIRATORY_TRACT | Status: DC
  Administered 2019-10-12 – 2019-10-17 (×6): 18 ug via RESPIRATORY_TRACT
  Filled 2019-10-12 (×2): qty 5

## 2019-10-12 MED ORDER — VANCOMYCIN HCL 1750 MG/350ML IV SOLN
1750.0000 mg | INTRAVENOUS | Status: DC
  Filled 2019-10-12: qty 350

## 2019-10-12 MED ORDER — TRAZODONE HCL 50 MG PO TABS
50.0000 mg | ORAL_TABLET | Freq: Once | ORAL | Status: DC
  Filled 2019-10-12: qty 1

## 2019-10-12 MED ORDER — TORSEMIDE 20 MG PO TABS
20.0000 mg | ORAL_TABLET | Freq: Every day | ORAL | Status: DC
  Administered 2019-10-12 – 2019-10-18 (×7): 20 mg via ORAL
  Filled 2019-10-12 (×7): qty 1

## 2019-10-12 MED ORDER — FLUDROCORTISONE ACETATE 0.1 MG PO TABS
0.1000 mg | ORAL_TABLET | Freq: Two times a day (BID) | ORAL | Status: DC
  Administered 2019-10-12 – 2019-10-18 (×12): 0.1 mg via ORAL
  Filled 2019-10-12 (×15): qty 1

## 2019-10-12 MED ORDER — MIDODRINE HCL 5 MG PO TABS
2.5000 mg | ORAL_TABLET | Freq: Once | ORAL | Status: AC
  Administered 2019-10-12: 2.5 mg via ORAL
  Filled 2019-10-12: qty 1

## 2019-10-12 MED ORDER — ENSURE ENLIVE PO LIQD
237.0000 mL | Freq: Two times a day (BID) | ORAL | Status: DC
  Administered 2019-10-13 – 2019-10-17 (×8): 237 mL via ORAL

## 2019-10-12 MED ORDER — SODIUM CHLORIDE 0.9 % IV SOLN
20.0000 ug/min | INTRAVENOUS | Status: DC
  Administered 2019-10-12: 26.6667 ug/min via INTRAVENOUS
  Administered 2019-10-12: 10:00:00 40 ug/min via INTRAVENOUS
  Administered 2019-10-13: 22:00:00 35 ug/min via INTRAVENOUS
  Administered 2019-10-13: 06:00:00 33.333 ug/min via INTRAVENOUS
  Administered 2019-10-13: 19:00:00 70 ug/min via INTRAVENOUS
  Administered 2019-10-14: 55 ug/min via INTRAVENOUS
  Administered 2019-10-14 (×2): 35 ug/min via INTRAVENOUS
  Administered 2019-10-15: 05:00:00 10 ug/min via INTRAVENOUS
  Filled 2019-10-12 (×2): qty 10
  Filled 2019-10-12: qty 1
  Filled 2019-10-12 (×2): qty 10
  Filled 2019-10-12 (×2): qty 1
  Filled 2019-10-12 (×2): qty 10
  Filled 2019-10-12 (×4): qty 1

## 2019-10-12 MED ORDER — MIDODRINE HCL 5 MG PO TABS: 5.0000 mg | ORAL_TABLET | Freq: Three times a day (TID) | ORAL | Status: DC

## 2019-10-12 MED ORDER — CHLORHEXIDINE GLUCONATE CLOTH 2 % EX PADS
6.0000 | MEDICATED_PAD | Freq: Every day | CUTANEOUS | Status: DC
  Administered 2019-10-14 – 2019-10-15 (×2): 6 via TOPICAL

## 2019-10-12 MED ORDER — ALBUMIN HUMAN 25 % IV SOLN
12.5000 g | Freq: Every day | INTRAVENOUS | Status: DC
  Administered 2019-10-12 – 2019-10-18 (×7): 12.5 g via INTRAVENOUS
  Filled 2019-10-12 (×7): qty 50

## 2019-10-12 MED ORDER — PRO-STAT SUGAR FREE PO LIQD
30.0000 mL | Freq: Three times a day (TID) | ORAL | Status: DC
  Administered 2019-10-12 – 2019-10-16 (×11): 30 mL via ORAL

## 2019-10-12 MED ORDER — MIDODRINE HCL 5 MG PO TABS
5.0000 mg | ORAL_TABLET | Freq: Three times a day (TID) | ORAL | Status: DC
  Administered 2019-10-12 (×3): 5 mg via ORAL
  Filled 2019-10-12 (×3): qty 1

## 2019-10-12 MED ORDER — ALBUMIN HUMAN 25 % IV SOLN
25.0000 g | Freq: Once | INTRAVENOUS | Status: AC
  Administered 2019-10-12: 05:00:00 25 g via INTRAVENOUS
  Filled 2019-10-12: qty 100

## 2019-10-12 MED ORDER — DEXTROAMPHETAMINE SULFATE ER 5 MG PO CP24
15.0000 mg | ORAL_CAPSULE | Freq: Three times a day (TID) | ORAL | Status: DC
  Administered 2019-10-12 – 2019-10-18 (×16): 15 mg via ORAL
  Filled 2019-10-12 (×18): qty 3

## 2019-10-12 MED ORDER — ADULT MULTIVITAMIN W/MINERALS CH
1.0000 | ORAL_TABLET | Freq: Every day | ORAL | Status: DC
  Administered 2019-10-13 – 2019-10-18 (×6): 1 via ORAL
  Filled 2019-10-12 (×6): qty 1

## 2019-10-12 MED ORDER — ALBUMIN HUMAN 25 % IV SOLN: 12.5000 g | Freq: Once | INTRAVENOUS | Status: DC

## 2019-10-12 MED ORDER — COLLAGENASE 250 UNIT/GM EX OINT
1.0000 "application " | TOPICAL_OINTMENT | Freq: Every day | CUTANEOUS | Status: DC
  Administered 2019-10-12: 1 via TOPICAL
  Filled 2019-10-12: qty 30

## 2019-10-12 MED ORDER — MODAFINIL 200 MG PO TABS
300.0000 mg | ORAL_TABLET | Freq: Two times a day (BID) | ORAL | Status: DC
  Administered 2019-10-13 – 2019-10-15 (×4): 300 mg via ORAL
  Filled 2019-10-12 (×5): qty 3

## 2019-10-12 NOTE — Progress Notes (Signed)
PT Cancellation Note  Patient Details Name: Miguel Palmer MRN: [id number] DOB: [birth year]   Cancelled Treatment:    Reason Eval/Treat Not Completed: Medical issues which prohibited therapy;Patient not medically ready(Chart reviewed. Of note, pt transfered to SDU overnight. Pressure remain soft. Multiple consults pending.Will hold evaluation at this time, attempt again later date/time once appropriate.)  9:00 AM, 10/12/19 Etta Grandchild, PT, DPT Physical Therapist - [facility]  [phone] ([place])    Jontez Redfield C 10/12/2019, 9:00 AM

## 2019-10-12 NOTE — Progress Notes (Signed)
Attempted to contact wife for update. No answer. Day shift RN notified.

## 2019-10-12 NOTE — Consult Note (Signed)
Consultation Note Date: 10/12/2019   Patient Name: Miguel Palmer  DOB: 1940/07/15  MRN: [id number]  Age / Sex: 79 y.o., male  PCP: [name], MD Referring Physician: Sharen Hones, MD  Reason for Consultation: Establishing goals of care  HPI/Patient Profile: 79 y.o. male  with past medical history of T2DM, COPD, CAD, chronic lymphedema, pulmonary fibrosis, and narcolepsy admitted on 10/11/2019 with anasarca and hypotension.  Hypotension secondary to cardiogenic vs sepsis vs dehydration. Patient with elevated troponin likely d/t demand ischemia in the setting of acute illness. Patient with possible RLE infection. Patient has been started on neosynephrine drip. PMT consulted for [place].  Clinical Assessment and Goals of Care: I have reviewed medical records including EPIC notes, labs and imaging, received report from RN, assessed the patient and then met with patient and wife, Miguel Palmer, to discuss diagnosis prognosis, GOC, EOL wishes, disposition and options.  I introduced Palliative Medicine as specialized medical care for people living with serious illness. It focuses on providing relief from the symptoms and stress of a serious illness. The goal is to improve quality of life for both the patient and the family.  We discussed a brief life review of the patient. Wife tells me they have lived in [place] about 8 years. They have 2 children - one in [place] and one in [place].  As far as functional and nutritional status, wife tells me of a drastic decline in the past month. For the past month he has not been ambulating. She helps with ADLs. She tells me of decreased appetite - sometimes goes 2 days without eating. She tells me she feels that he is depressed. She also tells me of impaired cognition.    We discussed patient's current illness and what it means in the larger context of patient's on-going co-morbidities.  Natural disease trajectory and  expectations at EOL were discussed. She tells me "I know things aren't looking good and he is declining". We discuss that he has many chronic health conditions and does seem to be declining in function, nutrition, and cognition. Discuss acute illness with hypotension and interventions being utilized currently.   I attempted to elicit values and goals of care important to the patient. Patient shares that quality of life is important to him.   The difference between aggressive medical intervention and comfort care was considered in light of the patient's goals of care. For now, they are okay with continuing current interventions. They are realistic about patient's health.   \We discussed code status - wife shares patient had elected DNR in the past. Discussed this with patient - recommended DNR status d/t baseline frailty and numerous chronic conditions - he and wife both agree DNR is appropriate.   Discussed taking care one day at a time and see how patient responds. Palliative will follow up tomorrow for ongoing goals of care discussions. Wife agreeable.   We did discuss disposition - wife is most interested in patient coming home with extra support. Did not yet discuss home health vs hospice. She is open to short term rehab if strongly recommended.  Questions and concerns were addressed. The family was encouraged to call with questions or concerns.   Above discussed with RN and Dr. Lanney Gins  Primary Decision Maker NEXT OF KIN - wife Miguel Palmer - patient joined in conversation but doubt he has capacity to make medical decisions independently, he defers to wife   SUMMARY OF RECOMMENDATIONS   - code status changed to DNR - patient  and wife educated about situation - PMT will follow  Up tomorrow for ongoing [place]  Code Status/Advance Care Planning:  DNR  Symptom Management:   Defer to primary - patient denies any symptoms  Prognosis:   Unable to determine - poor prognosis r/t frailty,  decline in function, nutrition, and cognition, multiple chronic conditions - likely eligible for hospice services at home   Discharge Planning: To Be Determined - wife hopeful patient can return home     Primary Diagnoses: Present on Admission: **None**   I have reviewed the medical record, interviewed the patient and family, and examined the patient. The following aspects are pertinent.  Past Medical History:  Diagnosis Date  . Arteriosclerosis    7 stents placed no s/s x 7 years  . Biceps tendinitis 05/29/2014  . COPD (chronic obstructive pulmonary disease) ([place])   . Diabetes mellitus without complication ([place])   . Disorder of rotator cuff 05/29/2014  . Edema    bilateral lower extremities  . Frank hematuria 04/28/2013  . Memory change 01/13/2014  . Narcolepsy   . Obesity   . 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 Oxygen deficiency    Social History   Socioeconomic History  . Marital status: Married    Spouse name: Not on file  . Number of children: Not on file  . Years of education: Not on file  . Highest education level: Not on file  Occupational History  . Occupation: retired  Tobacco Use  . Smoking status: Former Smoker    Packs/day: 2.00    Years: 22.00    Pack years: 44.00    Types: Cigarettes  . Smokeless tobacco: Never Used  Substance and Sexual Activity  . Alcohol use: No    Alcohol/week: 0.0 standard drinks  . Drug use: No  . Sexual activity: Not on file  Other Topics Concern  . Not on file  Social History Narrative  . Not on file   Social Determinants of Health   Financial Resource Strain:   . Difficulty of Paying Living Expenses:   Food Insecurity:   . Worried About Charity fundraiser in the Last Year:   . Arboriculturist in the Last Year:   Transportation Needs:   . Film/video editor (Medical):   Marland Kitchen Lack of Transportation (Non-Medical):   Physical Activity:   . Days of Exercise per Week:   . Minutes of Exercise per Session:     Stress:   . Feeling of Stress :   Social Connections:   . Frequency of Communication with Friends and Family:   . Frequency of Social Gatherings with Friends and Family:   . Attends Religious Services:   . Active Member of Clubs or Organizations:   . Attends Archivist Meetings:   Marland Kitchen Marital Status:    Family History  Problem Relation Age of Onset  . Dementia Mother   . Asthma Father   . Emphysema Father   . Alzheimer's disease Father    Scheduled Meds: . apixaban  5 mg Oral BID  . Chlorhexidine Gluconate Cloth  6 each Topical Q0600  . dextroamphetamine  15 mg Oral TID  . midodrine  5 mg Oral TID WC  . modafinil  300 mg Oral TID  . sodium chloride flush  3 mL Intravenous Q12H  . tiotropium  18 mcg Inhalation QHS   Continuous Infusions: . sodium chloride    . phenylephrine (NEO-SYNEPHRINE) Adult infusion 40 mcg/min (10/12/19 0955)  . [  START ON 10/13/2019] vancomycin    . vancomycin 2,000 mg (10/12/19 1050)   PRN Meds:.sodium chloride, acetaminophen, ondansetron (ZOFRAN) IV, sodium chloride flush Allergies  Allergen Reactions  . Bee Pollen Shortness Of Breath    Post nasal drip  . Pollen Extract Shortness Of Breath    Post nasal drip Post nasal drip  . Codeine Other (See Comments)  . Other Other (See Comments)    pepper  . Piper Other (See Comments)  . Morphine Itching    (+) itching and redness noted to IV insertion site following administration. See ED nurses note from 11/25 at 0449. (+) itching and redness noted to IV insertion site following administration. See ED nurses note from 11/25 at 0449.   Marland Kitchen Morphine And Related Itching    (+) itching and redness noted to IV insertion site following administration. See ED nurses note from 11/25 at 0449.   Review of Systems  Constitutional: Positive for activity change and appetite change.  Respiratory: Negative for shortness of breath.   Gastrointestinal: Positive for diarrhea. Negative for nausea and  vomiting.  Psychiatric/Behavioral: Negative for agitation. The patient is not nervous/anxious.     Physical Exam Constitutional:      General: He is not in acute distress. Cardiovascular:     Rate and Rhythm: Tachycardia present.  Pulmonary:     Effort: Pulmonary effort is normal. No respiratory distress.  Musculoskeletal:     Right lower leg: Edema present.     Left lower leg: Edema present.     Comments: Wounds on RLE, red inflamed BLE  Skin:    General: Skin is warm and dry.  Neurological:     Mental Status: He is alert.     Comments: Difficult to assess d/t HOH - wife reports he is "foggy" - seems mostly oriented to situation and place     Vital Signs: BP (!) 83/51   Pulse (!) 104   Temp 98 F (36.7 C) (Oral)   Resp 16   Ht 6' 2"  (1.88 m)   Wt 119.9 kg   SpO2 (!) 87%   BMI 33.94 kg/m  Pain Scale: 0-10   Pain Score: 0-No pain   SpO2: SpO2: (!) 87 % O2 Device:SpO2: (!) 87 % O2 Flow Rate: .O2 Flow Rate (L/min): 2 L/min  IO: Intake/output summary:   Intake/Output Summary (Last 24 hours) at 10/12/2019 1242 Last data filed at 10/12/2019 0800 Gross per 24 hour  Intake 597.97 ml  Output 1825 ml  Net -1227.03 ml    LBM: Last BM Date: 10/12/19 Baseline Weight: Weight: 125.2 kg Most recent weight: Weight: 119.9 kg     Palliative Assessment/Data: PPS 20%    Time Total: 60 minutes Greater than 50%  of this time was spent counseling and coordinating care related to the above assessment and plan.  Juel Burrow, DNP, AGNP-C Palliative Medicine Team [phone] Pager: [phone]

## 2019-10-12 NOTE — Progress Notes (Signed)
Pt's BP running soft. NP notified. Orders for midodrine x 1 and to reduce rate on lasix drip. No improvement in BP noted. NP and RRT RN notified. New Orders then were given to hold lasix drip and give 1 more dose of midodrine x1 and continue to monitor for improvement. Pt also noted to have urinary retention during bladder scan. Orders for I&O cath x 1. Pt's urine output is adequate. Pt remains A&O x 4 and denies Dizziness, SOB, CP or any discomfort. BP improved intially for a short time after second midodrine dose administered and then declined shortly after. NP and RRT RN notified. Report called to [facility] and pt transferred to ICU safely. Will notify wife later this AM of transfer.

## 2019-10-12 NOTE — Consult Note (Signed)
WOC Nurse Consult Note: Reason for Consult:Nonhealing trauma wound to right anterior lower leg with bilateral edema to lower legs.  Wound type:trauma Pressure Injury POA: NA Measurement: distal:  2 cm x 1 cm x 0.2 cm with sloughing dark epithelium consistent with hematoma Proximal 1 cm round ruptured hematoma Wound bed: ruddy red Drainage (amount, consistency, odor) minimal serosanguinous  No odor Periwound:edema to bilateral lower legs Dressing procedure/placement/frequency: Cleanse wounds to right anterior lower leg with NS and pat dry. Apply Santyl to open wounds.  Cover with NS moist gauze.  Secure with ABD pad and wrap both legs with kerlix from below toes to below knee.  Secure with ace wrap.  Change daily.  Will not follow at this time.  Please re-consult if needed.  Domenic Moras MSN, RN, FNP-BC CWON Wound, Ostomy, Continence Nurse Pager [phone]

## 2019-10-12 NOTE — TOC Initial Note (Signed)
Transition of Care [facility]) - Initial/Assessment Note    Patient Details  Name: Miguel Palmer MRN: [id number] Date of Birth: December 14, 1940  Transition of Care [facility]) CM/SW Contact:    Shelbie Ammons, RN Phone Number: 10/12/2019, 10:44 AM  Clinical Narrative:     RNCM met with patient and wife at bedside. Patient came to hospital due to worsening swelling to his lower legs, increased weeping and increased weakness. Patient lives in an apartment with a second floor bedroom but wife reports over the last few weeks he has been increasingly weak and is now sleeping down stairs. Patient was moved to step down unit over night due to soft blood pressures.              Wife reports that ultimately she would like patient to return home however they both understand that this may not be possible at least initially. Patient reports he has had home health through [facility] and would be open to them again. Both patient and wife verbalize that he has a walker in the home. They are also both in agreement that rehab search be initiated in case he not be able to immediately return home.              PASSR and FL-2 completed, RNCM will continue to monitor and send out bed requests if PT/OT recommends.             Expected Discharge Plan: Skilled Nursing Facility Barriers to Discharge: Continued Medical Work up   Patient Goals and CMS Choice        Expected Discharge Plan and Services Expected Discharge Plan: [place]   Discharge Planning Services: CM Consult   Living arrangements for the past 2 months: Apartment                                      Prior Living Arrangements/Services Living arrangements for the past 2 months: Apartment Lives with:: Spouse Patient language and need for interpreter reviewed:: Yes Do you feel safe going back to the place where you live?: Yes      Need for Family Participation in Patient Care: Yes (Comment) Care giver support system in place?: Yes  (comment)   Criminal Activity/Legal Involvement Pertinent to Current Situation/Hospitalization: No - Comment as needed  Activities of Daily Living Home Assistive Devices/Equipment: Hearing aid, Walker (specify type) ADL Screening (condition at time of admission) Patient's cognitive ability adequate to safely complete daily activities?: Yes Is the patient deaf or have difficulty hearing?: Yes Does the patient have difficulty seeing, even when wearing glasses/contacts?: No Does the patient have difficulty concentrating, remembering, or making decisions?: No Patient able to express need for assistance with ADLs?: Yes Does the patient have difficulty dressing or bathing?: Yes Independently performs ADLs?: No Walks in Home: Dependent Is this a change from baseline?: Pre-admission baseline Does the patient have difficulty walking or climbing stairs?: Yes Weakness of Legs: Both Weakness of Arms/Hands: Both  Permission Sought/Granted Permission sought to share information with : Family Supports    Share Information with NAME: Theon Sobotka           Emotional Assessment Appearance:: Appears stated age Attitude/Demeanor/Rapport: Engaged Affect (typically observed): Appropriate Orientation: : Oriented to Self, Oriented to Place, Oriented to  Time, Oriented to Situation Alcohol / Substance Use: Not Applicable Psych Involvement: No (comment)  Admission diagnosis:  CHF (congestive heart failure) ([place]) [  I50.9] AKI (acute kidney injury) ([place]) [N17.9] Hypotension, unspecified hypotension type [I95.9] Acute on chronic congestive heart failure, unspecified heart failure type [facility]) [I50.9] Patient Active Problem List   Diagnosis Date Noted  . CHF (congestive heart failure) ([place]) 10/11/2019  . HCAP (healthcare-associated pneumonia) 06/24/2016  . Community acquired pneumonia 05/17/2016  . Chest pain 05/17/2016  . Diabetes mellitus without complication ([place]) 21/97/5883  . Chronic bilateral low  back pain with right-sided sciatica 05/01/2016  . Long term current use of anticoagulant therapy 05/01/2016  . Chronic pain of right lower extremity 05/01/2016  . Spinal stenosis of lumbar region 05/01/2016  . Other spondylosis with radiculopathy, lumbar region 05/01/2016  . Spondylolisthesis of lumbar region 05/01/2016  . Chronic pain syndrome 05/01/2016  . Hypoxemia requiring supplemental oxygen 12/27/2015  . Morbid obesity with BMI of 40.0-44.9, adult ([place]) 11/22/2015  . Chronic respiratory failure with hypoxia ([place]) 09/05/2015  . Pulmonary fibrosis, unspecified ([place]) 09/05/2015  . Restrictive airway disease 06/20/2015  . Chronic obstructive pulmonary disease ([place]) 04/01/2015  . Mixed hyperlipidemia 01/05/2015  . Non-ST elevation myocardial infarction (NSTEMI), subendocardial infarction, subsequent episode of care ([place]) 01/05/2015  . Benign essential hypertension 01/04/2015  . HLD (hyperlipidemia) 10/31/2014  . Heart attack ([place]) 10/31/2014  . Difficulty hearing 03/17/2014  . Abnormal gait 10/09/2013  . Obstructive apnea 08/30/2012  . 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 Prostate cancer screening 09/23/2011  . Vitamin D deficiency 09/23/2011  . Arteriosclerosis of coronary artery 09/04/2011  . CAD (coronary artery disease) 09/04/2011  . Allergic rhinitis 06/25/2011  . 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 Lower extremity weakness 03/30/2011  . Neuropathy ([place]) 01/17/2011   PCP:  [name], MD Pharmacy:   [id number] [id number]: [phone]: [phone]    Social Determinants of Health (SDOH) Interventions    Readmission Risk Interventions No flowsheet data found.

## 2019-10-12 NOTE — Progress Notes (Signed)
Patient ID: Miguel Palmer, male   DOB: 28-Mar-1941, 79 y.o.   MRN: [id number]  PROGRESS NOTE    Miguel Palmer  SNK:[id number] DOB: Aug 10, 1940 DOA: 10/11/2019 PCP: [name], MD (Confirm with patient/family/NH records and if not entered, this HAS to be entered at [facility] point of entry. "No PCP" if truly none.)   Brief Narrative: (Start on day 1 of progress note - keep it brief and live) Patient is a 79 year old male with history of type 2 diabetes, COPD, coronary artery disease, obesity, who is admitted to the hospital with anasarca and hypotension.  Chronically, patient was on night oxygen, and a as needed day oxygen.  He has baseline short of breath with exertion.  He walks with a walker, on a walk about 3 steps.  He had infrequent paroxysmal active dyspnea, he has no orthopnea.  He has a chronic leg edema, seems to be chronic in nature not getting worse.  He denies any weight gain. Patient also states that he has chronic constipation, he took some laxative about a week ago.  Since then, he has been having loose stools for the past week.  2-3 loose stools a day, no rectal bleeding. In the emergency room, he was a found to have hypotension.  He also had a significant leg edema.  Chest x-ray has chronic changes without acute pulmonary edema. Procalcitonin 0.44, BNP 1906.    Assessment & Plan:   Active Problems:   CHF (congestive heart failure) ([place])  #1.  Hypotension.  Etiology including congestive heart failure versus dehydration.  Patient has been having diarrhea for the last week.  He has elevated BNP, but no previous lab work since 2018.  Clinically, his short of breath does not seem to be worse.  He has received albumin, started midodrine.  Echocardiogram is ordered, results pending.  Patient will be seen by cardiology.  Hold off any additional diuretics.  #2.  Anasarca.  Patient has some leg edema.  We will review echocardiogram results before starting any diuretics.  Pending  cardiology consult.  #3.  Anemia.  Hemoglobin 7.7 today, no previous labs in the last 3 years.  We will check iron B12 level.  Also check stool for occult blood.  #4.  Loose stool.  Patient appeared to have diarrhea for the last week starting after laxative.  I will check a C. difficile toxin.  #5.  Permanent atrial fibrillation.  Continue anticoagulation.  Rate under control.  #6.  Type 2 diabetes.   Glucose not significantly elevated this time.  Continue sliding scale insulin.  #7. Right low extremity ulceration. The patient wife, patient has a lower extremity ulceration for the last week, has been getting worse.  Occasionally has more redness.  Patient has mild elevation of procalcitonin level, I will cover with vancomycin for now.  #8.  AKI. Patient will be seen by nephrology.  Appears to be a component of dehydration.  Albumin has been given.    DVT prophylaxis: Anticoagulation. Code Status: Full Family Communication: Discussed with the patient and the patient and wife, all questions answered.  Disposition Plan:  . Patient came from: Home            . Anticipated d/c place: Home . Barriers to d/c OR conditions which need to be met to effect a safe d/c:   Consultants:   Nephrology and cardiology  Procedures: (Don't include imaging studies which can be auto populated. Include things that cannot be auto populated i.e. Echo, Carotid  and venous dopplers, Foley, Bipap, HD, tubes/drains, wound vac, central lines etc)  None  Antimicrobials: (specify start and planned stop date. Auto populated tables are space occupying and do not give end dates)  Vanco   Subjective: Patient feels better today.  Blood pressure seems to be better since last night.  Currently does not have any short of breath.  Still on 2 L oxygen. Still has some loose stools, no nausea vomiting abdominal pain.   Objective: Vitals:   10/12/19 0311 10/12/19 0317 10/12/19 0350 10/12/19 0400  BP: (!)  71/55 (!) 76/53 92/69 (!) 84/61  Pulse: 88 81 97 88  Resp: 15 14 18 20   Temp:    97.6 F (36.4 C)  TempSrc:    Oral  SpO2: 100% 100% 100% 100%  Weight:   119.9 kg   Height:   6' 2"  (1.88 m)     Intake/Output Summary (Last 24 hours) at 10/12/2019 0808 Last data filed at 10/12/2019 0141 Gross per 24 hour  Intake 1200 ml  Output 1525 ml  Net -325 ml   Filed Weights   10/11/19 1235 10/11/19 2101 10/12/19 0350  Weight: 125.2 kg 120.3 kg 119.9 kg    Examination:  General exam: Appears calm and comfortable  Respiratory system: Clear to auscultation. Respiratory effort normal. Cardiovascular system: S1 & S2 heard, Irregular. No JVD, murmurs, rubs, gallops or clicks. 2+ pedal edema. Gastrointestinal system: Abdomen is nondistended, soft and nontender. No organomegaly or masses felt. Normal bowel sounds heard. Central nervous system: Alert and oriented. No focal neurological deficits. Extremities: Symmetric 5 x 5 power. Skin: Venous ulcer with mild redness Psychiatry: Judgement and insight appear normal. Mood & affect appropriate.     Data Reviewed: I have personally reviewed following labs and imaging studies  CBC: Recent Labs  Lab 10/11/19 1249 10/12/19 0624  WBC 6.9 7.6  NEUTROABS 5.2 5.8  HGB 8.4* 7.7*  HCT 28.4* 25.9*  MCV 85.8 85.5  PLT 166 329   Basic Metabolic Panel: Recent Labs  Lab 10/11/19 1249 10/12/19 0427 10/12/19 0624  NA 131* 134*  --   K 4.6 4.1  --   CL 93* 93*  --   CO2 27 30  --   GLUCOSE 95 107*  --   BUN 47* 41*  --   CREATININE 1.99* 1.73*  --   CALCIUM 8.6* 8.5*  --   MG  --   --  2.5*  PHOS  --   --  3.5   GFR: Estimated Creatinine Clearance: 47.7 mL/min (A) (by C-G formula based on SCr of 1.73 mg/dL (H)). Liver Function Tests: Recent Labs  Lab 10/11/19 1249 10/11/19 1507  AST 40  --   ALT 25  --   ALKPHOS 101  --   BILITOT 2.4*  --   PROT 5.8*  --   ALBUMIN 2.8* 2.5*   Recent Labs  Lab 10/11/19 1249  LIPASE 30   No  results for input(s): AMMONIA in the last 168 hours. Coagulation Profile: Recent Labs  Lab 10/11/19 1249  INR 1.7*   Cardiac Enzymes: No results for input(s): CKTOTAL, CKMB, CKMBINDEX, TROPONINI in the last 168 hours. BNP (last 3 results) No results for input(s): PROBNP in the last 8760 hours. HbA1C: No results for input(s): HGBA1C in the last 72 hours. CBG: No results for input(s): GLUCAP in the last 168 hours. Lipid Profile: No results for input(s): CHOL, HDL, LDLCALC, TRIG, CHOLHDL, LDLDIRECT in the last 72 hours. Thyroid Function Tests:  No results for input(s): TSH, T4TOTAL, FREET4, T3FREE, THYROIDAB in the last 72 hours. Anemia Panel: No results for input(s): VITAMINB12, FOLATE, FERRITIN, TIBC, IRON, RETICCTPCT in the last 72 hours. Sepsis Labs: Recent Labs  Lab 10/11/19 1249 10/11/19 1507 10/12/19 0624  LATICACIDVEN 2.2* 1.6 1.9    Recent Results (from the past 240 hour(s))  Blood culture (single)     Status: None (Preliminary result)   Collection Time: 10/11/19 12:49 PM   Specimen: BLOOD  Result Value Ref Range Status   Specimen Description BLOOD BLOOD RIGHT WRIST  Final   Special Requests   Final    BOTTLES DRAWN AEROBIC AND ANAEROBIC Blood Culture adequate volume   Culture   Final    NO GROWTH < 24 HOURS Performed at [facility], [address] [place], [place] [address]    Report Status PENDING  Incomplete  Blood culture (single)     Status: None (Preliminary result)   Collection Time: 10/11/19  1:01 PM   Specimen: BLOOD  Result Value Ref Range Status   Specimen Description BLOOD RIGHT ANTECUBITAL  Final   Special Requests   Final    BOTTLES DRAWN AEROBIC AND ANAEROBIC Blood Culture results may not be optimal due to an excessive volume of blood received in culture bottles   Culture   Final    NO GROWTH < 24 HOURS Performed at [facility], [address], [place], [place] [address]    Report Status PENDING  Incomplete  Respiratory  Panel by RT PCR (Flu A&B, Covid) - Nasopharyngeal Swab     Status: None   Collection Time: 10/11/19  5:06 PM   Specimen: Nasopharyngeal Swab  Result Value Ref Range Status   SARS Coronavirus 2 by RT PCR NEGATIVE NEGATIVE Final    Comment: (NOTE) SARS-CoV-2 target nucleic acids are NOT DETECTED. The SARS-CoV-2 RNA is generally detectable in upper respiratoy specimens during the acute phase of infection. The lowest concentration of SARS-CoV-2 viral copies this assay can detect is 131 copies/mL. A negative result does not preclude SARS-Cov-2 infection and should not be used as the sole basis for treatment or other patient management decisions. A negative result may occur with  improper specimen collection/handling, submission of specimen other than nasopharyngeal swab, presence of viral mutation(s) within the areas targeted by this assay, and inadequate number of viral copies (<131 copies/mL). A negative result must be combined with clinical observations, patient history, and epidemiological information. The expected result is Negative. Fact Sheet for Patients:  PinkCheek.be Fact Sheet for Healthcare Providers:  GravelBags.it This test is not yet ap proved or cleared by the [place] FDA and  has been authorized for detection and/or diagnosis of SARS-CoV-2 by FDA under an Emergency Use Authorization (EUA). This EUA will remain  in effect (meaning this test can be used) for the duration of the COVID-19 declaration under Section 564(b)(1) of the Act, 21 U.S.C. section 360bbb-3(b)(1), unless the authorization is terminated or revoked sooner.    Influenza A by PCR NEGATIVE NEGATIVE Final   Influenza B by PCR NEGATIVE NEGATIVE Final    Comment: (NOTE) The Xpert Xpress SARS-CoV-2/FLU/RSV assay is intended as an aid in  the diagnosis of influenza from Nasopharyngeal swab specimens and  should not be used as a sole basis for  treatment. Nasal washings and  aspirates are unacceptable for Xpert Xpress SARS-CoV-2/FLU/RSV  testing. Fact Sheet for Patients: PinkCheek.be Fact Sheet for Healthcare Providers: GravelBags.it This test is not yet approved or cleared by  the [employer] and  has been authorized for detection and/or diagnosis of SARS-CoV-2 by  FDA under an Emergency Use Authorization (EUA). This EUA will remain  in effect (meaning this test can be used) for the duration of the  Covid-19 declaration under Section 564(b)(1) of the Act, 21  U.S.C. section 360bbb-3(b)(1), unless the authorization is  terminated or revoked. Performed at [facility], [address] [place], [place] [address]   MRSA PCR Screening     Status: None   Collection Time: 10/12/19  3:49 AM   Specimen: Nasopharyngeal  Result Value Ref Range Status   MRSA by PCR NEGATIVE NEGATIVE Final    Comment:        The GeneXpert MRSA Assay (FDA approved for NASAL specimens only), is one component of a comprehensive MRSA colonization surveillance program. It is not intended to diagnose MRSA infection nor to guide or monitor treatment for MRSA infections. Performed at [facility], [address], [place], [place] [address]          Radiology Studies: DG Chest 1 View  Result Date: 10/12/2019 CLINICAL DATA:  Shortness of breath EXAM: CHEST  1 VIEW COMPARISON:  10/11/2019 FINDINGS: Heart is normal size. Interstitial prominence again noted throughout the lungs compatible with chronic interstitial lung disease. This is similar to prior study. No effusions. No acute bony abnormality. IMPRESSION: Stable exam.  Chronic interstitial prominence. Electronically Signed   By: Rolm Baptise M.D.   On: 10/12/2019 01:35   DG Chest Portable 1 View  Result Date: 10/11/2019 CLINICAL DATA:  Hypotension. Weakness. Congestive heart failure. EXAM: PORTABLE CHEST 1 VIEW  COMPARISON:  Chest x-rays dated 06/24/2016 and 05/17/2016 FINDINGS: The heart size and pulmonary vascularity are normal. The azygos vein is slightly distended chronically which suggest elevated right heart pressure. There is slight chronic accentuation of the interstitial markings at the lung bases and in the mid zones. No acute infiltrates or effusions. No acute bone abnormality. IMPRESSION: 1. No acute abnormality. 2. Chronic interstitial disease. 3. Chronic distention of the azygos vein suggesting elevated right heart pressure. Electronically Signed   By: Lorriane Shire M.D.   On: 10/11/2019 15:10        Scheduled Meds: . apixaban  5 mg Oral BID  . Chlorhexidine Gluconate Cloth  6 each Topical Q0600  . dextroamphetamine  15 mg Oral TID  . midodrine  5 mg Oral TID WC  . modafinil  300 mg Oral TID  . sodium chloride flush  3 mL Intravenous Q12H  . tiotropium  18 mcg Inhalation QHS   Continuous Infusions: . sodium chloride    . phenylephrine (NEO-SYNEPHRINE) Adult infusion       LOS: 1 day    Time spent: 45 minutes    Sharen Hones, MD [facility]   To contact the attending provider between 7A-7P or the covering provider during after hours 7P-7A, please log into the web site [email or web address] and access using universal [place] password for that web site. If you do not have the password, please call the hospital operator.  10/12/2019, 8:08 AM

## 2019-10-12 NOTE — Progress Notes (Addendum)
      BRIEF OVERNIGHT PROGRESS REPORT  SUBJECTIVE: Patient's blood pressure remain low in the 31'P systolic with MAP <21 despite holding Lasix gtt and administration of midodrine. Patient remains asymptomatic.  OBJECTIVE: He is afebrile with blood pressure 76/63 mm Hg and pulse rate 81 beats/min. There were no focal neurological deficits; he was alert and oriented x4, and he did not demonstrate any memory deficits.   BRIEF PATIENT DESCRIPTION: 79 y.o. male with CAD s/p 7 stents, HFpEF, pAF on warfarin, mild aortic valve stenosis morbid obesity w/ obesity-hypoventilation syndrome, narcolepsy, and COPD with severe edema in LEs, hypotension and worsening of his baseline exertional dyspnea consistent with acute exacerbation of HFpEF.  ASSESSMENT/PLAN:  1. Acute on chronic HFpEF with Moderate volume ascites Last ECHO 12/2017 with EF >55% presents with volume overload ~. proBNP elevated 1906. Repeat chest Xray stable from prior exam earlier in the day.  - Stop Lasix gtt  given significant hypotension s/t cardiogenic shock - Transfer to stepdown - Echo pending - Strict I&O - Low sodium diet - Albumin low, consider replacement - Start Phenylephrine for BP augmentation - Consult to Intensivist for BP management      Rufina Falco, DNP, CCRN, FNP-C [facility] Nurse Practitioner

## 2019-10-12 NOTE — Progress Notes (Signed)
Initial Nutrition Assessment  DOCUMENTATION CODES:   Non-severe (moderate) malnutrition in context of chronic illness  INTERVENTION:   Ensure Enlive po BID, each supplement provides 350 kcal and 20 grams of protein  Prostat liquid protein PO 30 ml TID with meals, each supplement provides 100 kcal, 15 grams protein.  MVI daily   Liberalize diet   NUTRITION DIAGNOSIS:   Moderate Malnutrition related to chronic illness(COPD, CHF) as evidenced by moderate fat depletion, moderate muscle depletion.  GOAL:   Patient will meet greater than or equal to 90% of their needs  MONITOR:   PO intake, Supplement acceptance, Labs, Weight trends, Skin, I & O's  REASON FOR ASSESSMENT:   Consult Assessment of nutrition requirement/status  ASSESSMENT:   79 y.o. male with past medical history of T2DM, COPD, CAD, chronic lymphedema, pulmonary fibrosis, and narcolepsy admitted on 10/11/2019 with anasarca and hypotension.   Met with pt in room today. Pt reports decreased appetite and oral intake pta and today. Pt is a vegan and he is unhappy about the food he has been receiving on his trays. Pt does not eat any meat, but does occasionally eat eggs and dairy products. Pt reports that he gets his protein via beans and other vegetables. Pt has been working with his primary care doctor to try and increase his protein intake. Pt reports that he has tried some different kinds of shakes in the past. Pt reports that he has been loosing his muscles and he is feeling very week. RD discussed with pt the importance of adequate protein intake needed to preserve lean muscle. Discussed how although it is possible to meet your protein needs on a vegan diet, he must make sure he is eating a variety of foods to balance his amino acid intake. RD also discussed the need for increased protein and calories while hospitalized. Discussed Ensure supplements with pt; he is willing to try them despite the milk proteins (strawberry).  RD will also add Prostat between meals and liberalize the heart healthy portion of pt's diet as this is restrictive and he is not likely eating enough to exceed nutrient limits.   Per chart, pt has lost ~75lbs over the past several years. Pt does appear to be weight stable over the past year; however, pt is currently with anasarca so unsure what his true weight is.   Medications reviewed and include: neo-synephrine, vancomycin   Labs reviewed: Na 134(L), BUN 41(H), creat 1.73(H), P 3.5 wnl, Mg 2.5(H) Pre-albumin 6.0(L)- 4/20 Hgb 7.7(L), Hct 25.9(L), MCH 25.4(L), MCHC 29.7(L)  NUTRITION - FOCUSED PHYSICAL EXAM:    Most Recent Value  Orbital Region  Moderate depletion  Upper Arm Region  Severe depletion  Thoracic and Lumbar Region  Moderate depletion  Buccal Region  Moderate depletion  Temple Region  Moderate depletion  Clavicle Bone Region  Moderate depletion  Clavicle and Acromion Bone Region  Moderate depletion  Scapular Bone Region  Moderate depletion  Dorsal Hand  Moderate depletion  Patellar Region  Unable to assess  Anterior Thigh Region  Unable to assess  Posterior Calf Region  Unable to assess  Edema (RD Assessment)  Severe  Hair  Reviewed  Eyes  Reviewed  Mouth  Reviewed  Skin  Reviewed  Nails  Reviewed     Diet Order:   Diet Order            Diet 2 gram sodium Room service appropriate? Yes; Fluid consistency: Thin  Diet effective now  EDUCATION NEEDS:   Education needs have been addressed  Skin:  Skin Assessment: Reviewed RN Assessment(RLE 2 cm x 1 cm x 0.2 cm with sloughing dark epithelium consistent with hematoma, ecchymosis)  Last BM:  4/21  Height:   Ht Readings from Last 1 Encounters:  10/12/19 6' 2"  (1.88 m)    Weight:   Wt Readings from Last 1 Encounters:  10/12/19 119.9 kg    Ideal Body Weight:  86.3 kg  BMI:  Body mass index is 33.94 kg/m.  Estimated Nutritional Needs:   Kcal:  2400-2700kcal/day  Protein:   >120g/day  Fluid:  2.1L/day  Miguel Distance MS, RD, LDN Please refer to [facility] for RD and/or RD on-call/weekend/after hours pager

## 2019-10-12 NOTE — Consult Note (Addendum)
Name: Miguel Palmer MRN: [id number] DOB: 1941-04-19    ADMISSION DATE:  10/11/2019 CONSULTATION DATE: 10/12/2019  REFERRING MD : Rufina Falco, NP   CHIEF COMPLAINT: Hypotension   BRIEF PATIENT DESCRIPTION:  79 yo male admitted with hypotension concerning for sepsis vs cardiogenic shock, hypovolemia secondary to diarrhea, and chronic lymphedema requiring lasix gtt requiring transfer to the stepdown unit due to worsening hypotension   SIGNIFICANT EVENTS/STUDIES:  04/20: Pt admitted to the progressive care unit with lasix gtt infusing 04/21: Pt developed hypotension requiring transfer to the stepdown unit.  Lasix gtt discontinued and pt received iv albumin x1 dose   HISTORY OF PRESENT ILLNESS:   This is a 79 yo male with a PMH of Obesity, Testicular Hypofunction, Narcolepsy, Memory Change, Frank Hematuria, Chronic Lymphoedema, Type II Diabetes Mellitus, COPD, Bicep Tendinitis, and Arteriosclerosis.  He presented to [facility] ER on 04/20 from [facility] with hypotension and concern of sepsis.  It was also reported the pt had multiple RLE wounds and worsening bilateral lower extremity edema secondary to chronic lymphedema. He reports he has been wrapping his right leg in trash bags to collect the fluid.  He also reported having diarrhea x1 week after taking laxatives. In the ER initial bp 80/50, however after receiving 700 ml NS bolus bp increased to 92/60.  In the ER lab results revealed Na+ 131, chloride 93, BUN 47, creatinine 1.00, calcium 8.6, albumin 2.8, BNP 1,906, troponin 72, and hgb 8.4.  Influenza PCR/COVID-19 negative and CXR revealed no acute abnormality.  He received 500 ml NS bolus with improvement of bp. He was admitted to the progressive care unit per hospitalist team and lasix gtt initiated.  However, on 04/21 pt developed hypotension requiring transfer to the stepdown unit PCCM team consulted to assist with management.   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  has a past surgical history that includes Coronary stent placement and Knee arthroscopy (Right). Prior to Admission medications   Medication Sig Start Date End Date Taking? Authorizing Provider  acetaminophen (TYLENOL) 325 MG tablet Take 2 tablets (650 mg total) by mouth every 6 (six) hours as needed for mild pain (or Fever >/= 101). 06/27/16  Yes Gouru, Illene Silver, MD  ascorbic acid (VITAMIN C) 500 MG tablet Take 500 mg by mouth 3 (three) times daily.    Yes [provider]  chlorhexidine (PERIDEX) 0.12 % solution Use as directed 15 mLs in the mouth or throat 2 (two) times daily. After breakfast and before bedtime 09/05/19  Yes [provider]  Cholecalciferol (VITAMIN D3) 50 MCG (2000 UT) capsule Take 2,000 Units by mouth daily.   Yes [provider]  Coenzyme Q10 100 MG TABS Take 100 mg by mouth 4 (four) times daily.    Yes [provider]  dextroamphetamine (DEXEDRINE SPANSULE) 15 MG 24 hr capsule Take 1 capsule by mouth 3 (three) times daily.  05/29/16  Yes [provider]  DHA-PHOSPHATIDYLSERINE PO Take 100 mg by mouth in the morning, at noon, and at bedtime.    Yes [provider]  ELIQUIS 5 MG TABS tablet Take 5 mg by mouth 2 (two) times daily. 07/28/19  Yes [provider]  gabapentin (NEURONTIN) 400 MG capsule Take 400 mg by  mouth 4 (four) times daily.   Yes [provider]  Ginkgo Biloba 60 MG TABS Take 60 mg by mouth 2 (two) times daily.    Yes [provider]  Glucosamine Sulfate 1000 MG CAPS Take 1,000 mg by mouth 2 (two) times daily.    Yes [provider]  guaiFENesin  (MUCINEX) 600 MG 12 hr tablet Take 600 mg by mouth 2 (two) times daily as needed.    Yes [provider]  metoprolol succinate (TOPROL-XL) 50 MG 24 hr tablet Take 50 mg by mouth daily. TAKE 1 TABLET BY MOUTH EVERY DAY 08/11/14  Yes [provider]  modafinil (PROVIGIL) 100 MG tablet Take 300 mg by mouth daily.  03/26/16  Yes [provider]  Multiple Vitamins-Minerals (MULTIVITAMIN & MINERAL PO) Take by mouth.   Yes [provider]  nitroGLYCERIN (NITROSTAT) 0.4 MG SL tablet Place 0.4 mg under the tongue every 5 (five) minutes as needed for chest pain.  10/09/13  Yes [provider]  PHOSPHATIDYLSERINE PO Take 60 mg by mouth 3 (three) times daily.   Yes [provider]  rosuvastatin (CRESTOR) 20 MG tablet Take 20 mg by mouth at bedtime.  10/02/14  Yes [provider]  tiotropium (SPIRIVA) 18 MCG inhalation capsule Place 1 capsule (18 mcg total) into inhaler and inhale daily. 06/28/16  Yes Gouru, Illene Silver, MD  torsemide (DEMADEX) 20 MG tablet Take 2 tablets (79m) by mouth once daily.   Yes [provider]   Allergies  Allergen Reactions  . Bee Pollen Shortness Of Breath    Post nasal drip  . Pollen Extract Shortness Of Breath    Post nasal drip Post nasal drip  . Codeine Other (See Comments)  . Other Other (See Comments)    pepper  . Piper Other (See Comments)  . Morphine Itching    (+) itching and redness noted to IV insertion site following administration. See ED nurses note from 11/25 at 0449. (+) itching and redness noted to IV insertion site following administration. See ED nurses note from 11/25 at 0449.   .Marland KitchenMorphine And Related Itching    (+) itching and redness noted to IV insertion site following administration. See ED nurses note from 11/25 at 0449.    FAMILY HISTORY:  family history includes Alzheimer's disease in his father; Asthma in his father; Dementia in his mother; Emphysema in his father. SOCIAL HISTORY:   reports that he has quit smoking. His smoking use included cigarettes. He has a 44.00 pack-year smoking history. He has never used smokeless tobacco. He reports that he does not drink alcohol or use drugs.  REVIEW OF SYSTEMS: Possible in BOLD  Constitutional: Negative for fever, chills, weight loss, malaise/fatigue and diaphoresis.  HENT: Negative for hearing loss, ear pain, nosebleeds, congestion, sore throat, neck pain, tinnitus and ear discharge.   Eyes: Negative for blurred vision, double vision, photophobia, pain, discharge and redness.  Respiratory: Negative for cough, hemoptysis, sputum production, shortness of breath, wheezing and stridor.   Cardiovascular: chest pain, palpitations, orthopnea, claudication, bilateral leg swelling (chronic BLE lymphedema) and PND.  Gastrointestinal: heartburn, nausea, vomiting, abdominal pain, diarrhea, constipation, blood in stool and melena.  Genitourinary: Negative for dysuria, urgency, frequency, hematuria and flank pain.  Musculoskeletal: Negative for myalgias, back pain, joint pain and falls.  Skin: abrasion to right lower extremity itching and rash.  Neurological: Negative for dizziness, tingling, tremors, sensory change, speech change, focal weakness, seizures, loss of consciousness, weakness and headaches.  Endo/Heme/Allergies:  Negative for environmental allergies and polydipsia. Does not bruise/bleed easily.  SUBJECTIVE:  No complaints at this time states " I'm hungry"  VITAL SIGNS: Temp:  [97.5 F (36.4 C)-97.6 F (36.4 C)] 97.6 F (36.4 C) (04/21 0400) Pulse Rate:  [71-101] 88 (04/21 0400) Resp:  [12-20] 20 (04/21 0400) BP: (71-106)/(44-85) 84/61 (04/21 0400) SpO2:  [86 %-100 %] 100 % (04/21 0400) Weight:  [119.9 kg-125.2 kg] 119.9 kg (04/21 0350)  PHYSICAL EXAMINATION: General: chronically ill appearing male, NAD resting in bed  Neuro: alert and oriented, follows commands HEENT: supple, no JVD  Cardiovascular: irregular irregular,  no R/G  Lungs: clear throughout, even, non labored  Abdomen: +BS x4, obese, 2+ abdominal swelling, non tender  Musculoskeletal: 3+ pitting bilateral lower extremity edema (chronic BLE lymphedema) Skin: right lower extremity abrasion with clear drainage and venous stasis color changes of BLE  Recent Labs  Lab 10/11/19 1249  NA 131*  K 4.6  CL 93*  CO2 27  BUN 47*  CREATININE 1.99*  GLUCOSE 95   Recent Labs  Lab 10/11/19 1249  HGB 8.4*  HCT 28.4*  WBC 6.9  PLT 166   DG Chest 1 View  Result Date: 10/12/2019 CLINICAL DATA:  Shortness of breath EXAM: CHEST  1 VIEW COMPARISON:  10/11/2019 FINDINGS: Heart is normal size. Interstitial prominence again noted throughout the lungs compatible with chronic interstitial lung disease. This is similar to prior study. No effusions. No acute bony abnormality. IMPRESSION: Stable exam.  Chronic interstitial prominence. Electronically Signed   By: Rolm Baptise M.D.   On: 10/12/2019 01:35   DG Chest Portable 1 View  Result Date: 10/11/2019 CLINICAL DATA:  Hypotension. Weakness. Congestive heart failure. EXAM: PORTABLE CHEST 1 VIEW COMPARISON:  Chest x-rays dated 06/24/2016 and 05/17/2016 FINDINGS: The heart size and pulmonary vascularity are normal. The azygos vein is slightly distended chronically which suggest elevated right heart pressure. There is slight chronic accentuation of the interstitial markings at the lung bases and in the mid zones. No acute infiltrates or effusions. No acute bone abnormality. IMPRESSION: 1. No acute abnormality. 2. Chronic interstitial disease. 3. Chronic distention of the azygos vein suggesting elevated right heart pressure. Electronically Signed   By: Lorriane Shire M.D.   On: 10/11/2019 15:10    ASSESSMENT / PLAN:  COPD-stable   Hx: Restrictive Airway Disease, Narcolepsy, and OSA (pt has not tolerated CPAP in the past) Prn supplemental O2 for dyspnea and/or hypoxia  Continue outpatient spiriva, modafinil, and  dextroamphetamine  Hypotension secondary to cardiogenic vs. septic shock  Diarrhea  Elevated troponin likely demand ischemia in the setting of acute illness Chronic lymphedema of BLE  Hx: CAD, Moderate Aortic Valve Stenosis, MI, Idiopathic Hypotension, HLD, Chronic Heart Failure with Preserved EF, and Atrial Fibrillation  Continuous telemetry monitoring  Will discontinue lasix gtt due to hypotension (no s/sx of respiratory failure and/or pulmonary edema) Cortisol level pending Will start scheduled midodrine and prn peripheral neo-synephrine gtt to maintain map >65 Continue outpatient eliquis  Echo pending  Cardiology consulted appreciate input   Acute renal failure-improving creatinine trending down  Hypoalbuminemia Trend BMP  Replace electrolytes as indicated  Monitor UOP Avoid nephrotoxic medications  IV albumin x1 dose   Possible RLE infected wound Trend WBC and monitor fever Trend PCT-if elevated will start abx Follow cultures   Marda Stalker, [place] Pager [phone] (please enter 7 digits) PCCM Consult Pager [phone] (please enter 7 digits)

## 2019-10-12 NOTE — Progress Notes (Signed)
Pharmacy Antibiotic Note  Miguel Palmer is a 79 y.o. male admitted on 10/11/2019. Patient with chronic weeping in right leg. Pharmacy has been consulted for vancomycin dosing.  Plan: Vancomycin 2 g IV x 1 followed by vancomycin 1750 mg IV q36hr Goal AUC 400-550 Expected AUC: 514 SCr used: 1.73   Height: 6' 2"  (188 cm) Weight: 119.9 kg (264 lb 5.3 oz) IBW/kg (Calculated) : 82.2  Temp (24hrs), Avg:97.6 F (36.4 C), Min:97.5 F (36.4 C), Max:98 F (36.7 C)  Recent Labs  Lab 10/11/19 1249 10/11/19 1507 10/12/19 0427 10/12/19 0624  WBC 6.9  --   --  7.6  CREATININE 1.99*  --  1.73*  --   LATICACIDVEN 2.2* 1.6  --  1.9    Estimated Creatinine Clearance: 47.7 mL/min (A) (by C-G formula based on SCr of 1.73 mg/dL (H)).    Allergies  Allergen Reactions  . Bee Pollen Shortness Of Breath    Post nasal drip  . Pollen Extract Shortness Of Breath    Post nasal drip Post nasal drip  . Codeine Other (See Comments)  . Other Other (See Comments)    pepper  . Piper Other (See Comments)  . Morphine Itching    (+) itching and redness noted to IV insertion site following administration. See ED nurses note from 11/25 at 0449. (+) itching and redness noted to IV insertion site following administration. See ED nurses note from 11/25 at 0449.   Marland Kitchen Morphine And Related Itching    (+) itching and redness noted to IV insertion site following administration. See ED nurses note from 11/25 at 0449.    Antimicrobials this admission: Vancomycin 4/21 >>   Microbiology results: 4/20 BCx: NG 24hr 4/21 Wound Cx: GPC 4/21 MRSA PCR: negative  Thank you for allowing pharmacy to be a part of this patient's care.  Tawnya Crook, PharmD 10/12/2019 11:59 AM

## 2019-10-12 NOTE — Progress Notes (Signed)
OT Cancellation Note  Patient Details Name: Miguel Palmer MRN: [id number] DOB: Sep 26, 1940   Cancelled Treatment:    Reason Eval/Treat Not Completed: Medical issues which prohibited therapy;Other (comment). Thank you for the OT consult. Order received and chart reviewed. Of note, pt transfered to SDU overnight. Pressure remain soft. Multiple consults pending.Will hold OT evaluation at this time, attempt again later date/time once appropriate.  Shara Blazing, M.S., OTR/L Ascom: [phone] 10/12/19, 9:05 AM

## 2019-10-12 NOTE — Consult Note (Signed)
[facility] CONSULT NOTE    Date: 10/12/2019                  Patient Name:  Miguel Palmer  MRN: [id number]  DOB: [birth year]  Age / Sex: 79 y.o., male         PCP: [name], MD                 Service Requesting Consult:  Hospitalist                 Reason for Consult:  Acute kidney injury, chronic kidney disease stage IIIb            History of Present Illness: Patient is a 79 y.o. male with a PMHx of COPD, diabetes mellitus type 2, chronic lower extremity edema, obesity, chronic kidney disease stage IIIb baseline creatinine 1.8, coronary artery disease who was admitted to [facility] on 10/11/2019 for evaluation of weakness and diarrhea.  Patient is a poor historian therefore his wife provides most of the history.  Apparently he was having significant trouble with constipation and took laxatives.  Thereafter he developed diarrhea.  In addition the patient has chronic lymphedema with seeping from the lower extremities.  He has been on torsemide for this issue.  He subsequently developed significant hypotension and transferred over to the critical care unit.  We are asked to see him for acute kidney injury on chronic kidney disease stage IIIb.  As above his baseline creatinine is 1.8 with an EGFR of 35.  Hypotension likely contributing to his acute kidney injury along with recent diuretic usage that was aggressive.   Medications: Outpatient medications: Medications Prior to Admission  Medication Sig Dispense Refill Last Dose  . acetaminophen (TYLENOL) 325 MG tablet Take 2 tablets (650 mg total) by mouth every 6 (six) hours as needed for mild pain (or Fever >/= 101).   10/10/2019 at PRN  . ascorbic acid (VITAMIN C) 500 MG tablet Take 500 mg by mouth 3 (three) times daily.    10/10/2019 at 1200  . chlorhexidine (PERIDEX) 0.12 % solution Use as directed 15 mLs in the mouth or throat 2 (two) times daily. After breakfast and before bedtime   Past Week at Unknown time  .  Cholecalciferol (VITAMIN D3) 50 MCG (2000 UT) capsule Take 2,000 Units by mouth daily.   10/10/2019 at 1000  . Coenzyme Q10 100 MG TABS Take 100 mg by mouth 4 (four) times daily.    10/10/2019 at 1200  . dextroamphetamine (DEXEDRINE SPANSULE) 15 MG 24 hr capsule Take 1 capsule by mouth 3 (three) times daily.   0 10/11/2019 at 0800  . DHA-PHOSPHATIDYLSERINE PO Take 100 mg by mouth in the morning, at noon, and at bedtime.    10/10/2019 at 1200  . ELIQUIS 5 MG TABS tablet Take 5 mg by mouth 2 (two) times daily.   10/11/2019 at 0800  . gabapentin (NEURONTIN) 400 MG capsule Take 400 mg by mouth 4 (four) times daily.   10/11/2019 at 0800  . Ginkgo Biloba 60 MG TABS Take 60 mg by mouth 2 (two) times daily.    10/10/2019 at Unknown time  . Glucosamine Sulfate 1000 MG CAPS Take 1,000 mg by mouth 2 (two) times daily.    10/10/2019 at Unknown time  . guaiFENesin (MUCINEX) 600 MG 12 hr tablet Take 600 mg by mouth 2 (two) times daily as needed.    Past Week at PRN  . metoprolol succinate (TOPROL-XL)  50 MG 24 hr tablet Take 50 mg by mouth daily. TAKE 1 TABLET BY MOUTH EVERY DAY   10/11/2019 at 0800  . modafinil (PROVIGIL) 100 MG tablet Take 300 mg by mouth daily.    10/11/2019 at 0800  . Multiple Vitamins-Minerals (MULTIVITAMIN & MINERAL PO) Take by mouth.   10/10/2019 at 1200  . nitroGLYCERIN (NITROSTAT) 0.4 MG SL tablet Place 0.4 mg under the tongue every 5 (five) minutes as needed for chest pain.    PRN at PRN  . PHOSPHATIDYLSERINE PO Take 60 mg by mouth 3 (three) times daily.   10/10/2019 at 1200  . rosuvastatin (CRESTOR) 20 MG tablet Take 20 mg by mouth at bedtime.    10/10/2019 at 2000  . tiotropium (SPIRIVA) 18 MCG inhalation capsule Place 1 capsule (18 mcg total) into inhaler and inhale daily. 30 capsule 0 10/10/2019 at 1200  . torsemide (DEMADEX) 20 MG tablet Take 2 tablets (27m) by mouth once daily.   10/10/2019 at 1200    Current medications: Current Facility-Administered Medications  Medication Dose Route  Frequency Provider Last Rate Last Admin  . 0.9 %  sodium chloride infusion  250 mL Intravenous PRN SMax Sane MD      . acetaminophen (TYLENOL) tablet 650 mg  650 mg Oral Q4H PRN SMax Sane MD      . apixaban (ELIQUIS) tablet 5 mg  5 mg Oral BID SMax Sane MD   5 mg at 10/12/19 1230  . Chlorhexidine Gluconate Cloth 2 % PADS 6 each  6 each Topical Q0600 Ouma, EBing Neighbors NP      . collagenase (SANTYL) ointment 1 application  1 application Topical Daily ZSharen Hones MD      . dextroamphetamine (DEXEDRINE SPANSULE) 24 hr capsule 15 mg  15 mg Oral TID SMax Sane MD   15 mg at 10/12/19 1229  . midodrine (PROAMATINE) tablet 5 mg  5 mg Oral TID WC BAwilda Bill NP   5 mg at 10/12/19 1229  . modafinil (PROVIGIL) tablet 300 mg  300 mg Oral TID SMax Sane MD   300 mg at 10/12/19 1230  . ondansetron (ZOFRAN) injection 4 mg  4 mg Intravenous Q6H PRN SManuella Ghazi Vipul, MD      . phenylephrine (NEO-SYNEPHRINE) 10 mg in sodium chloride 0.9 % 250 mL (0.04 mg/mL) infusion  20-100 mcg/min Intravenous Titrated OLang Snow NP 60 mL/hr at 10/12/19 1200 40 mcg/min at 10/12/19 1200  . sodium chloride flush (NS) 0.9 % injection 3 mL  3 mL Intravenous Q12H SManuella Ghazi Vipul, MD   3 mL at 10/12/19 1010  . sodium chloride flush (NS) 0.9 % injection 3 mL  3 mL Intravenous PRN SMax Sane MD      . tiotropium (SPIRIVA) inhalation capsule ([facility] use ONLY) 18 mcg  18 mcg Inhalation QHS BAwilda Bill NP      . [Derrill MemoON 10/13/2019] vancomycin (VANCOREADY) IVPB 1750 mg/350 mL  1,750 mg Intravenous Q36H ZSharen Hones MD          Allergies: Allergies  Allergen Reactions  . Bee Pollen Shortness Of Breath    Post nasal drip  . Pollen Extract Shortness Of Breath    Post nasal drip Post nasal drip  . Codeine Other (See Comments)  . Other Other (See Comments)    pepper  . [place] Other (See Comments)  . Morphine Itching    (+) itching and redness noted to IV insertion site following administration.  See ED nurses note from  11/25 at 0449. (+) itching and redness noted to IV insertion site following administration. See ED nurses note from 11/25 at 0449.   Marland Kitchen Morphine And Related Itching    (+) itching and redness noted to IV insertion site following administration. See ED nurses note from 11/25 at 0449.      Past Medical History: Past Medical History:  Diagnosis Date  . Arteriosclerosis    7 stents placed no s/s x 7 years  . Biceps tendinitis 05/29/2014  . COPD (chronic obstructive pulmonary disease) ([place])   . Diabetes mellitus without complication ([place])   . Disorder of rotator cuff 05/29/2014  . Edema    bilateral lower extremities  . Frank hematuria 04/28/2013  . Memory change 01/13/2014  . Narcolepsy   . Obesity   . 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 Oxygen deficiency      Past Surgical History: Past Surgical History:  Procedure Laterality Date  . CORONARY STENT PLACEMENT    . KNEE ARTHROSCOPY Right      Family History: Family History  Problem Relation Age of Onset  . Dementia Mother   . Asthma Father   . Emphysema Father   . Alzheimer's disease Father      Social History: Social History   Socioeconomic History  . Marital status: Married    Spouse name: Not on file  . Number of children: Not on file  . Years of education: Not on file  . Highest education level: Not on file  Occupational History  . Occupation: retired  Tobacco Use  . Smoking status: Former Smoker    Packs/day: 2.00    Years: 22.00    Pack years: 44.00    Types: Cigarettes  . Smokeless tobacco: Never Used  Substance and Sexual Activity  . Alcohol use: No    Alcohol/week: 0.0 standard drinks  . Drug use: No  . Sexual activity: Not on file  Other Topics Concern  . Not on file  Social History Narrative  . Not on file   Social Determinants of Health   Financial Resource Strain:   . Difficulty of Paying Living Expenses:   Food Insecurity:   . Worried About Ship broker in the Last Year:   . Arboriculturist in the Last Year:   Transportation Needs:   . Film/video editor (Medical):   Marland Kitchen Lack of Transportation (Non-Medical):   Physical Activity:   . Days of Exercise per Week:   . Minutes of Exercise per Session:   Stress:   . Feeling of Stress :   Social Connections:   . Frequency of Communication with Friends and Family:   . Frequency of Social Gatherings with Friends and Family:   . Attends Religious Services:   . Active Member of Clubs or Organizations:   . Attends Archivist Meetings:   Marland Kitchen Marital Status:   Intimate Partner Violence:   . Fear of Current or Ex-Partner:   . Emotionally Abused:   Marland Kitchen Physically Abused:   . Sexually Abused:      Review of Systems: Patient unable to provide accurate review of systems as he is extremely hard of hearing and had difficulty hearing questions posed to him.  Vital Signs: Blood pressure 93/61, pulse (!) 120, temperature 98.3 F (36.8 C), temperature source Oral, resp. rate 17, height 6' 2"  (1.88 m), weight 119.9 kg, SpO2 (!) 75 %.  Weight trends: Filed Weights   10/11/19 1235 10/11/19 2101 10/12/19 0350  Weight: 125.2 kg 120.3 kg 119.9 kg    Physical Exam: General:  Chronically ill-appearing  Head: Normocephalic, atraumatic.  Eyes: Anicteric, EOMI  Nose: Mucous membranes moist, not inflammed, nonerythematous.  Throat: Oropharynx nonerythematous, no exudate appreciated.   Neck: Supple, trachea midline.  Lungs:  Normal respiratory effort. Clear to auscultation BL without crackles or wheezes.  Heart: S1S2 no obvious rub  Abdomen:  BS normoactive. Soft, Nondistended, non-tender.  No masses or organomegaly.  Extremities: 3+ bilateral lower extremity edema  Neurologic: A&O X3, Motor strength is 5/5 in the all 4 extremities  Skin: Skin breaks noted on bilateral lower extremities    Lab results: Basic Metabolic Panel: Recent Labs  Lab 10/11/19 1249 10/12/19 0427  10/12/19 0624  NA 131* 134*  --   K 4.6 4.1  --   CL 93* 93*  --   CO2 27 30  --   GLUCOSE 95 107*  --   BUN 47* 41*  --   CREATININE 1.99* 1.73*  --   CALCIUM 8.6* 8.5*  --   MG  --   --  2.5*  PHOS  --   --  3.5    Liver Function Tests: Recent Labs  Lab 10/11/19 1249 10/11/19 1507  AST 40  --   ALT 25  --   ALKPHOS 101  --   BILITOT 2.4*  --   PROT 5.8*  --   ALBUMIN 2.8* 2.5*   Recent Labs  Lab 10/11/19 1249  LIPASE 30   No results for input(s): AMMONIA in the last 168 hours.  CBC: Recent Labs  Lab 10/11/19 1249 10/12/19 0624  WBC 6.9 7.6  NEUTROABS 5.2 5.8  HGB 8.4* 7.7*  HCT 28.4* 25.9*  MCV 85.8 85.5  PLT 166 153    Cardiac Enzymes: No results for input(s): CKTOTAL, CKMB, CKMBINDEX, TROPONINI in the last 168 hours.  BNP: Invalid input(s): POCBNP  CBG: Recent Labs  Lab 10/12/19 0345  GLUCAP 121*    Microbiology: Results for orders placed or performed during the hospital encounter of 10/11/19  Blood culture (single)     Status: None (Preliminary result)   Collection Time: 10/11/19 12:49 PM   Specimen: BLOOD  Result Value Ref Range Status   Specimen Description BLOOD BLOOD RIGHT WRIST  Final   Special Requests   Final    BOTTLES DRAWN AEROBIC AND ANAEROBIC Blood Culture adequate volume   Culture   Final    NO GROWTH < 24 HOURS Performed at [facility], [address], [place], [place] [address]    Report Status PENDING  Incomplete  Blood culture (single)     Status: None (Preliminary result)   Collection Time: 10/11/19  1:01 PM   Specimen: BLOOD  Result Value Ref Range Status   Specimen Description BLOOD RIGHT ANTECUBITAL  Final   Special Requests   Final    BOTTLES DRAWN AEROBIC AND ANAEROBIC Blood Culture results may not be optimal due to an excessive volume of blood received in culture bottles   Culture   Final    NO GROWTH < 24 HOURS Performed at [facility], [address], [place], [place] [address]     Report Status PENDING  Incomplete  Respiratory Panel by RT PCR (Flu A&B, Covid) - Nasopharyngeal Swab     Status: None   Collection Time: 10/11/19  5:06 PM   Specimen: Nasopharyngeal Swab  Result Value Ref Range Status   SARS Coronavirus 2 by RT PCR NEGATIVE  NEGATIVE Final    Comment: (NOTE) SARS-CoV-2 target nucleic acids are NOT DETECTED. The SARS-CoV-2 RNA is generally detectable in upper respiratoy specimens during the acute phase of infection. The lowest concentration of SARS-CoV-2 viral copies this assay can detect is 131 copies/mL. A negative result does not preclude SARS-Cov-2 infection and should not be used as the sole basis for treatment or other patient management decisions. A negative result may occur with  improper specimen collection/handling, submission of specimen other than nasopharyngeal swab, presence of viral mutation(s) within the areas targeted by this assay, and inadequate number of viral copies (<131 copies/mL). A negative result must be combined with clinical observations, patient history, and epidemiological information. The expected result is Negative. Fact Sheet for Patients:  PinkCheek.be Fact Sheet for Healthcare Providers:  GravelBags.it This test is not yet ap proved or cleared by the [place] FDA and  has been authorized for detection and/or diagnosis of SARS-CoV-2 by FDA under an Emergency Use Authorization (EUA). This EUA will remain  in effect (meaning this test can be used) for the duration of the COVID-19 declaration under Section 564(b)(1) of the Act, 21 U.S.C. section 360bbb-3(b)(1), unless the authorization is terminated or revoked sooner.    Influenza A by PCR NEGATIVE NEGATIVE Final   Influenza B by PCR NEGATIVE NEGATIVE Final    Comment: (NOTE) The Xpert Xpress SARS-CoV-2/FLU/RSV assay is intended as an aid in  the diagnosis of influenza from Nasopharyngeal swab specimens and   should not be used as a sole basis for treatment. Nasal washings and  aspirates are unacceptable for Xpert Xpress SARS-CoV-2/FLU/RSV  testing. Fact Sheet for Patients: PinkCheek.be Fact Sheet for Healthcare Providers: GravelBags.it This test is not yet approved or cleared by the [place] FDA and  has been authorized for detection and/or diagnosis of SARS-CoV-2 by  FDA under an Emergency Use Authorization (EUA). This EUA will remain  in effect (meaning this test can be used) for the duration of the  Covid-19 declaration under Section 564(b)(1) of the Act, 21  U.S.C. section 360bbb-3(b)(1), unless the authorization is  terminated or revoked. Performed at [facility], [address] [place], [place] [address]   MRSA PCR Screening     Status: None   Collection Time: 10/12/19  3:49 AM   Specimen: Nasopharyngeal  Result Value Ref Range Status   MRSA by PCR NEGATIVE NEGATIVE Final    Comment:        The GeneXpert MRSA Assay (FDA approved for NASAL specimens only), is one component of a comprehensive MRSA colonization surveillance program. It is not intended to diagnose MRSA infection nor to guide or monitor treatment for MRSA infections. Performed at [facility], [address] [place], [place] [address]   Aerobic/Anaerobic Culture (surgical/deep wound)     Status: None (Preliminary result)   Collection Time: 10/12/19  4:46 AM   Specimen: Leg; Wound  Result Value Ref Range Status   Specimen Description   Final    LEG RIGHT Performed at [facility], [address], [place], [place] [address]    Special Requests   Final    NONE Performed at [facility], [address] [place], [place] [address]    Gram Stain   Final    NO WBC SEEN FEW GRAM POSITIVE COCCI IN PAIRS Performed at [facility], [place] [address], [place], [place] [address]    Culture PENDING   Incomplete   Report Status PENDING  Incomplete  Coagulation Studies: Recent Labs    10/11/19 1249  LABPROT 19.4*  INR 1.7*    Urinalysis: No results for input(s): COLORURINE, LABSPEC, PHURINE, GLUCOSEU, HGBUR, BILIRUBINUR, KETONESUR, PROTEINUR, UROBILINOGEN, NITRITE, LEUKOCYTESUR in the last 72 hours.  Invalid input(s): APPERANCEUR    Imaging: DG Chest 1 View  Result Date: 10/12/2019 CLINICAL DATA:  Shortness of breath EXAM: CHEST  1 VIEW COMPARISON:  10/11/2019 FINDINGS: Heart is normal size. Interstitial prominence again noted throughout the lungs compatible with chronic interstitial lung disease. This is similar to prior study. No effusions. No acute bony abnormality. IMPRESSION: Stable exam.  Chronic interstitial prominence. Electronically Signed   By: Rolm Baptise M.D.   On: 10/12/2019 01:35   DG Chest Portable 1 View  Result Date: 10/11/2019 CLINICAL DATA:  Hypotension. Weakness. Congestive heart failure. EXAM: PORTABLE CHEST 1 VIEW COMPARISON:  Chest x-rays dated 06/24/2016 and 05/17/2016 FINDINGS: The heart size and pulmonary vascularity are normal. The azygos vein is slightly distended chronically which suggest elevated right heart pressure. There is slight chronic accentuation of the interstitial markings at the lung bases and in the mid zones. No acute infiltrates or effusions. No acute bone abnormality. IMPRESSION: 1. No acute abnormality. 2. Chronic interstitial disease. 3. Chronic distention of the azygos vein suggesting elevated right heart pressure. Electronically Signed   By: Lorriane Shire M.D.   On: 10/11/2019 15:10      Assessment & Plan: Pt is a 79 y.o. male with a PMHx of COPD, diabetes mellitus type 2, chronic lower extremity edema, obesity, chronic kidney disease stage IIIb baseline creatinine 1.8, coronary artery disease who was admitted to [facility] on 10/11/2019 for evaluation of weakness and diarrhea.  1.  Acute kidney injury/chronic kidney disease stage IIIb  baseline creatinine 1.8 with an EGFR of 35.  The patient did have an episode of acute kidney injury.  His creatinine was 1.99.  Now creatinine down to 1.7.  Diuretics have been held at the moment.  Agree with this approach.  Once blood pressure stabilizes consider restarting these given significant lower extremity edema.  Obtain renal ultrasound to make sure no underlying obstruction.  2.  Hypotension.  Maintain the patient on norepinephrine to maintain a map of 65 or greater.  3.  Lower extremity edema.  Diuretics on hold at the moment.  We will look to reinitiate these once blood pressure has stabilized a bit.  Could consider physical therapy assistance with leg wraps as well.

## 2019-10-13 ENCOUNTER — Inpatient Hospital Stay
Admit: 2019-10-13 | Discharge: 2019-10-13 | Disposition: A | Discharge status: Home / Self Care | Payer: Medicare Other | Attending: Internal Medicine | Admitting: Internal Medicine

## 2019-10-13 ENCOUNTER — Inpatient Hospital Stay: Payer: Medicare Other

## 2019-10-13 DIAGNOSIS — Z515 Encounter for palliative care: Secondary | ICD-10-CM

## 2019-10-13 LAB — CBC WITH DIFFERENTIAL/PLATELET
Abs Immature Granulocytes: 0.02 10*3/uL (ref 0.00–0.07)
Basophils Absolute: 0 10*3/uL (ref 0.0–0.1)
Basophils Relative: 0 %
Eosinophils Absolute: 0.1 10*3/uL (ref 0.0–0.5)
Eosinophils Relative: 2 %
HCT: 28.8 % — ABNORMAL LOW (ref 39.0–52.0)
Hemoglobin: 8.7 g/dL — ABNORMAL LOW (ref 13.0–17.0)
Immature Granulocytes: 0 %
Lymphocytes Relative: 22 %
Lymphs Abs: 1.2 10*3/uL (ref 0.7–4.0)
MCH: 25.4 pg — ABNORMAL LOW (ref 26.0–34.0)
MCHC: 30.2 g/dL (ref 30.0–36.0)
MCV: 84 fL (ref 80.0–100.0)
Monocytes Absolute: 0.8 10*3/uL (ref 0.1–1.0)
Monocytes Relative: 14 %
Neutro Abs: 3.5 10*3/uL (ref 1.7–7.7)
Neutrophils Relative %: 62 %
Platelets: 154 10*3/uL (ref 150–400)
RBC: 3.43 MIL/uL — ABNORMAL LOW (ref 4.22–5.81)
RDW: 21.9 % — ABNORMAL HIGH (ref 11.5–15.5)
Smear Review: NORMAL
WBC: 5.7 10*3/uL (ref 4.0–10.5)
nRBC: 0 % (ref 0.0–0.2)

## 2019-10-13 LAB — ECHOCARDIOGRAM COMPLETE
Height: 74 in
Weight: 4229.3 [oz_av]

## 2019-10-13 LAB — BASIC METABOLIC PANEL
Anion gap: 8 (ref 5–15)
BUN: 31 mg/dL — ABNORMAL HIGH (ref 8–23)
CO2: 32 mmol/L (ref 22–32)
Calcium: 8.6 mg/dL — ABNORMAL LOW (ref 8.9–10.3)
Chloride: 100 mmol/L (ref 98–111)
Creatinine, Ser: 1.22 mg/dL (ref 0.61–1.24)
GFR calc Af Amer: >60 mL/min (ref >60)
GFR calc non Af Amer: 56 mL/min — ABNORMAL LOW (ref >60)
Glucose, Bld: 108 mg/dL — ABNORMAL HIGH (ref 70–99)
Potassium: 3.8 mmol/L (ref 3.5–5.1)
Sodium: 140 mmol/L (ref 135–145)

## 2019-10-13 LAB — VITAMIN B12: Vitamin B-12: 629 pg/mL (ref 180–914)

## 2019-10-13 LAB — IRON AND TIBC
Iron: 20 ug/dL — ABNORMAL LOW (ref 45–182)
Saturation Ratios: 8 % — ABNORMAL LOW (ref 17.9–39.5)
TIBC: 265 ug/dL (ref 250–450)
UIBC: 245 ug/dL

## 2019-10-13 LAB — PHOSPHORUS: Phosphorus: 3.2 mg/dL (ref 2.5–4.6)

## 2019-10-13 LAB — MAGNESIUM: Magnesium: 2.3 mg/dL (ref 1.7–2.4)

## 2019-10-13 LAB — PROCALCITONIN: Procalcitonin: 0.21 ng/mL

## 2019-10-13 MED ORDER — SODIUM CHLORIDE 0.9 % IV SOLN
1.0000 g | Freq: Three times a day (TID) | INTRAVENOUS | Status: DC
  Administered 2019-10-13 – 2019-10-14 (×3): 1 g via INTRAVENOUS
  Filled 2019-10-13 (×7): qty 1

## 2019-10-13 MED ORDER — CALCIUM CARBONATE ANTACID 500 MG PO CHEW: 1.0000 | CHEWABLE_TABLET | Freq: Two times a day (BID) | ORAL | Status: DC

## 2019-10-13 MED ORDER — POLYSACCHARIDE IRON COMPLEX 150 MG PO CAPS
150.0000 mg | ORAL_CAPSULE | Freq: Every day | ORAL | Status: DC
  Administered 2019-10-13 – 2019-10-18 (×6): 150 mg via ORAL
  Filled 2019-10-13 (×7): qty 1

## 2019-10-13 MED ORDER — VANCOMYCIN HCL 1500 MG/300ML IV SOLN
1500.0000 mg | INTRAVENOUS | Status: DC
  Administered 2019-10-13: 11:00:00 1500 mg via INTRAVENOUS
  Filled 2019-10-13 (×2): qty 300

## 2019-10-13 MED ORDER — CALCIUM CARBONATE ANTACID 500 MG PO CHEW
1.0000 | CHEWABLE_TABLET | Freq: Two times a day (BID) | ORAL | Status: DC | PRN
  Administered 2019-10-13 – 2019-10-14 (×2): 200 mg via ORAL
  Filled 2019-10-13 (×2): qty 1

## 2019-10-13 MED ORDER — COLLAGENASE 250 UNIT/GM EX OINT
1.0000 "application " | TOPICAL_OINTMENT | Freq: Every day | CUTANEOUS | Status: DC
  Administered 2019-10-13 – 2019-10-18 (×6): 1 via TOPICAL
  Filled 2019-10-13 (×2): qty 30

## 2019-10-13 NOTE — Progress Notes (Signed)
PROGRESS NOTE    Miguel Palmer  ZOX:[id number] DOB: 1941/06/04 DOA: 10/11/2019 PCP: [name], MD (Confirm with patient/family/NH records and if not entered, this HAS to be entered at [facility] point of entry. "No PCP" if truly none.)   Brief Narrative: (Start on day 1 of progress note - keep it brief and live) Patient is a 79 year old male with history of type 2 diabetes, COPD, coronary artery disease, obesity, who is admitted to the hospital with anasarca and hypotension.  Chronically, patient was on night oxygen, and a as needed day oxygen.  He has baseline short of breath with exertion.  He walks with a walker, on a walk about 3 steps.  He had infrequent paroxysmal active dyspnea, he has no orthopnea.  He has a chronic leg edema, seems to be chronic in nature not getting worse.  He denies any weight gain. Patient also states that he has chronic constipation, he took some laxative about a week ago.  Since then, he has been having loose stools for the past week.  2-3 loose stools a day, no rectal bleeding. In the emergency room, he was a found to have hypotension.  He also had a significant leg edema.  Chest x-ray has chronic changes without acute pulmonary edema. Procalcitonin 0.44, BNP 1906.    Assessment & Plan:   Active Problems:   CHF (congestive heart failure) (HCC)   Hypotension   Goals of care, counseling/discussion   DNR (do not resuscitate)   Malnutrition of moderate degree   Palliative care by specialist  #1.  Hypotension. Phenylephrine was started to maintain blood pressure.  Etiology is due to poor heart function.  Patient also had a significant diarrhea recently.  He had received albumin, Florinef as well as midodrine.  However, patient is still requiring phenylephrine to maintain blood pressure.  Cardiology consult will be obtained to evaluate patient and decide on patient prognosis.  Palliative care has also evaluate the patient, family has not made a decision as for  which way to go.  Echocardiogram still pending.  #2.  Anasarca. Patient still has significant leg edema, etiology could be secondary to congestive heart failure, hypoalbuminemia.  Torsemide was restarted while blood pressure is maintained.  Appreciate nephrology consult.  3.  Iron deficiency anemia.   B12 level still pending.  Will start oral iron supplement.  #4.  Right lower extremity ulceration. Seen by wound care.  Due to elevated procalcitonin level, patient currently covered with vancomycin and meropenem.  Blood culture came back negative.  We will treat her for additional 2 or 3 days.  #5.  Permanent atrial fibrillation. Continue anticoagulation.  6.  Type 2 diabetes.  Continue sliding scale insulin.  7.  Acute kidney injury.   Most likely due to hypotension.  Renal function has normalized.          DVT prophylaxis: Eliquis Code Status: DNR Family Communication: Discussed with patient and the wife, all questions answered.  Disposition Plan:  . Patient came from:            . Anticipated d/c place: . Barriers to d/c OR conditions which need to be met to effect a safe d/c:   Consultants:   Nephrology  Cardiology  Procedures: None Antimicrobials:Vanc and meropenem  Subjective: Patient feels better today.  No significant short of breath.  Still has significant leg edema.  Diarrhea has been better.  No nausea vomiting.  No fever chills.  Objective: Vitals:   10/13/19 1330 10/13/19 1400  10/13/19 1436 10/13/19 1437  BP: 95/63 (!) 79/54    Pulse: 81 94    Resp: 12 15    Temp:      TempSrc:      SpO2: 99% 97% (!) 88% 97%  Weight:      Height:        Intake/Output Summary (Last 24 hours) at 10/13/2019 1503 Last data filed at 10/13/2019 1200 Gross per 24 hour  Intake 1532.53 ml  Output 1425 ml  Net 107.53 ml   Filed Weights   10/11/19 1235 10/11/19 2101 10/12/19 0350  Weight: 125.2 kg 120.3 kg 119.9 kg    Examination:  General exam: Appears calm  and comfortable  Respiratory system: A few crackles in the base.Marland Kitchen Respiratory effort normal. Cardiovascular system: Irregular. No JVD, murmurs, rubs, gallops or clicks. 3+ pedal edema. Gastrointestinal system: Abdomen is nondistended, soft and nontender. No organomegaly or masses felt. Normal bowel sounds heard. Central nervous system: Alert and oriented. No focal neurological deficits. Extremities: Symmetric 5 x 5 power. Skin: No rashes, lesions or ulcers Psychiatry: Judgement and insight appear normal. Mood & affect appropriate.     Data Reviewed: I have personally reviewed following labs and imaging studies  CBC: Recent Labs  Lab 10/11/19 1249 10/12/19 0624 10/13/19 0521  WBC 6.9 7.6 5.7  NEUTROABS 5.2 5.8 3.5  HGB 8.4* 7.7* 8.7*  HCT 28.4* 25.9* 28.8*  MCV 85.8 85.5 84.0  PLT 166 153 161   Basic Metabolic Panel: Recent Labs  Lab 10/11/19 1249 10/12/19 0427 10/12/19 0624 10/13/19 0521  NA 131* 134*  --  140  K 4.6 4.1  --  3.8  CL 93* 93*  --  100  CO2 27 30  --  32  GLUCOSE 95 107*  --  108*  BUN 47* 41*  --  31*  CREATININE 1.99* 1.73*  --  1.22  CALCIUM 8.6* 8.5*  --  8.6*  MG  --   --  2.5* 2.3  PHOS  --   --  3.5 3.2   GFR: Estimated Creatinine Clearance: 67.6 mL/min (by C-G formula based on SCr of 1.22 mg/dL). Liver Function Tests: Recent Labs  Lab 10/11/19 1249 10/11/19 1507  AST 40  --   ALT 25  --   ALKPHOS 101  --   BILITOT 2.4*  --   PROT 5.8*  --   ALBUMIN 2.8* 2.5*   Recent Labs  Lab 10/11/19 1249  LIPASE 30   No results for input(s): AMMONIA in the last 168 hours. Coagulation Profile: Recent Labs  Lab 10/11/19 1249  INR 1.7*   Cardiac Enzymes: No results for input(s): CKTOTAL, CKMB, CKMBINDEX, TROPONINI in the last 168 hours. BNP (last 3 results) No results for input(s): PROBNP in the last 8760 hours. HbA1C: No results for input(s): HGBA1C in the last 72 hours. CBG: Recent Labs  Lab 10/12/19 0345  GLUCAP 121*   Lipid  Profile: No results for input(s): CHOL, HDL, LDLCALC, TRIG, CHOLHDL, LDLDIRECT in the last 72 hours. Thyroid Function Tests: No results for input(s): TSH, T4TOTAL, FREET4, T3FREE, THYROIDAB in the last 72 hours. Anemia Panel: Recent Labs    10/13/19 0521  VITAMINB12 629  TIBC 265  IRON 20*   Sepsis Labs: Recent Labs  Lab 10/11/19 1249 10/11/19 1507 10/12/19 0624 10/13/19 0521  PROCALCITON  --   --  0.44 0.21  LATICACIDVEN 2.2* 1.6 1.9  --     Recent Results (from the past 240 hour(s))  Blood  culture (single)     Status: None (Preliminary result)   Collection Time: 10/11/19 12:49 PM   Specimen: BLOOD  Result Value Ref Range Status   Specimen Description BLOOD BLOOD RIGHT WRIST  Final   Special Requests   Final    BOTTLES DRAWN AEROBIC AND ANAEROBIC Blood Culture adequate volume   Culture   Final    NO GROWTH 2 DAYS Performed at [facility], [address] [place], [place] [address]    Report Status PENDING  Incomplete  Blood culture (single)     Status: None (Preliminary result)   Collection Time: 10/11/19  1:01 PM   Specimen: BLOOD  Result Value Ref Range Status   Specimen Description BLOOD RIGHT ANTECUBITAL  Final   Special Requests   Final    BOTTLES DRAWN AEROBIC AND ANAEROBIC Blood Culture results may not be optimal due to an excessive volume of blood received in culture bottles   Culture   Final    NO GROWTH 2 DAYS Performed at [facility], [address], [place], [place] [address]    Report Status PENDING  Incomplete  Respiratory Panel by RT PCR (Flu A&B, Covid) - Nasopharyngeal Swab     Status: None   Collection Time: 10/11/19  5:06 PM   Specimen: Nasopharyngeal Swab  Result Value Ref Range Status   SARS Coronavirus 2 by RT PCR NEGATIVE NEGATIVE Final    Comment: (NOTE) SARS-CoV-2 target nucleic acids are NOT DETECTED. The SARS-CoV-2 RNA is generally detectable in upper respiratoy specimens during the acute phase of infection. The  lowest concentration of SARS-CoV-2 viral copies this assay can detect is 131 copies/mL. A negative result does not preclude SARS-Cov-2 infection and should not be used as the sole basis for treatment or other patient management decisions. A negative result may occur with  improper specimen collection/handling, submission of specimen other than nasopharyngeal swab, presence of viral mutation(s) within the areas targeted by this assay, and inadequate number of viral copies (<131 copies/mL). A negative result must be combined with clinical observations, patient history, and epidemiological information. The expected result is Negative. Fact Sheet for Patients:  PinkCheek.be Fact Sheet for Healthcare Providers:  GravelBags.it This test is not yet ap proved or cleared by the [place] FDA and  has been authorized for detection and/or diagnosis of SARS-CoV-2 by FDA under an Emergency Use Authorization (EUA). This EUA will remain  in effect (meaning this test can be used) for the duration of the COVID-19 declaration under Section 564(b)(1) of the Act, 21 U.S.C. section 360bbb-3(b)(1), unless the authorization is terminated or revoked sooner.    Influenza A by PCR NEGATIVE NEGATIVE Final   Influenza B by PCR NEGATIVE NEGATIVE Final    Comment: (NOTE) The Xpert Xpress SARS-CoV-2/FLU/RSV assay is intended as an aid in  the diagnosis of influenza from Nasopharyngeal swab specimens and  should not be used as a sole basis for treatment. Nasal washings and  aspirates are unacceptable for Xpert Xpress SARS-CoV-2/FLU/RSV  testing. Fact Sheet for Patients: PinkCheek.be Fact Sheet for Healthcare Providers: GravelBags.it This test is not yet approved or cleared by the [place] FDA and  has been authorized for detection and/or diagnosis of SARS-CoV-2 by  FDA under an Emergency  Use Authorization (EUA). This EUA will remain  in effect (meaning this test can be used) for the duration of the  Covid-19 declaration under Section 564(b)(1) of the Act, 21  U.S.C. section 360bbb-3(b)(1), unless the authorization is  terminated  or revoked. Performed at [facility], [address] [place], [place] [address]   MRSA PCR Screening     Status: None   Collection Time: 10/12/19  3:49 AM   Specimen: Nasopharyngeal  Result Value Ref Range Status   MRSA by PCR NEGATIVE NEGATIVE Final    Comment:        The GeneXpert MRSA Assay (FDA approved for NASAL specimens only), is one component of a comprehensive MRSA colonization surveillance program. It is not intended to diagnose MRSA infection nor to guide or monitor treatment for MRSA infections. Performed at [facility], [address] [place], [place] [address]   Aerobic/Anaerobic Culture (surgical/deep wound)     Status: None (Preliminary result)   Collection Time: 10/12/19  4:46 AM   Specimen: Leg; Wound  Result Value Ref Range Status   Specimen Description   Final    LEG RIGHT Performed at [facility], [address] [place], [place] [address]    Special Requests   Final    NONE Performed at [facility], [address] [place], [place] [address]    Gram Stain   Final    NO WBC SEEN FEW GRAM POSITIVE COCCI IN PAIRS Performed at [facility], [place] [address] [place], [place] [address]    Culture ABUNDANT ACINETOBACTER SPECIES  Final   Report Status PENDING  Incomplete         Radiology Studies: DG Chest 1 View  Result Date: 10/12/2019 CLINICAL DATA:  Shortness of breath EXAM: CHEST  1 VIEW COMPARISON:  10/11/2019 FINDINGS: Heart is normal size. Interstitial prominence again noted throughout the lungs compatible with chronic interstitial lung disease. This is similar to prior study. No effusions. No acute bony abnormality. IMPRESSION: Stable exam.  Chronic  interstitial prominence. Electronically Signed   By: Rolm Baptise M.D.   On: 10/12/2019 01:35   US RENAL  Result Date: 10/12/2019 CLINICAL DATA:  Acute renal insufficiency EXAM: RENAL / URINARY TRACT ULTRASOUND COMPLETE COMPARISON:  None. FINDINGS: Right Kidney: Renal measurements: 9.9 x 5.1 by 5.2 cm = volume: 138 mL . Echogenicity within normal limits. No mass or hydronephrosis visualized. Left Kidney: Renal measurements: 11.4 x 6.0 by 5.1 cm = volume: 183 mL. Echotexture is normal. There is a nonobstructing 7 mm calculus within the upper pole. No hydronephrosis. Bladder: Bladder is moderately distended without filling defect. Other: Small right pleural effusion is noted.  There is trace ascites. IMPRESSION: 1. Nonobstructing 7 mm left renal calculus. 2. Otherwise unremarkable appearance of the kidneys. 3. Small right pleural effusion. 4. Trace ascites. Electronically Signed   By: Randa Ngo M.D.   On: 10/12/2019 17:31   US Abdomen Limited RUQ  Result Date: 10/13/2019 CLINICAL DATA:  Patient with NASH. EXAM: ULTRASOUND ABDOMEN LIMITED RIGHT UPPER QUADRANT COMPARISON:  None. FINDINGS: Gallbladder: Decompressed. Mild wall thickening measuring up to 5 mm. No cholelithiasis. Common bile duct: Diameter: 4 mm Liver: Increased in echogenicity. Portal vein is patent on color Doppler imaging with normal direction of blood flow towards the liver. Other: Moderate volume ascites. IMPRESSION: Mild gallbladder wall thickening which is nonspecific in etiology. No cholelithiasis or additional signs to suggest acute cholecystitis. Moderate volume ascites. Hepatic steatosis. Electronically Signed   By: Lovey Newcomer M.D.   On: 10/13/2019 10:44        Scheduled Meds: . apixaban  5 mg Oral BID  . Chlorhexidine Gluconate Cloth  6 each Topical Q0600  . collagenase  1  application Topical Daily  . dextroamphetamine  15 mg Oral TID  . feeding supplement (ENSURE ENLIVE)  237 mL Oral BID BM  . feeding supplement  (PRO-STAT SUGAR FREE 64)  30 mL Oral TID  . fludrocortisone  0.1 mg Oral BID  . midodrine  10 mg Oral TID WC  . modafinil  300 mg Oral BID  . multivitamin with minerals  1 tablet Oral Daily  . sodium chloride flush  3 mL Intravenous Q12H  . tiotropium  18 mcg Inhalation QHS  . torsemide  20 mg Oral Daily   Continuous Infusions: . sodium chloride    . albumin human 12.5 g (10/13/19 1315)  . meropenem (MERREM) IV    . phenylephrine (NEO-SYNEPHRINE) Adult infusion 70 mcg/min (10/13/19 1200)  . vancomycin 150 mL/hr at 10/13/19 1200     LOS: 2 days    Time spent: 40 minutes    Sharen Hones, MD [facility]   To contact the attending provider between 7A-7P or the covering provider during after hours 7P-7A, please log into the web site [email or web address] and access using universal [place] password for that web site. If you do not have the password, please call the hospital operator.  10/13/2019, 3:03 PM

## 2019-10-13 NOTE — Evaluation (Signed)
Occupational Therapy Evaluation Patient Details Name: Miguel Palmer MRN: [id number] DOB: 11/15/1940 Today's Date: 10/13/2019    History of Present Illness Miguel Palmer  is a 79 y.o. male with a known history of history of diabetes, COPD, CAD obesity who presents from [facility] with hypotension and concern of sepsis.  It was also reported the pt had multiple RLE wounds and worsening bilateral lower extremity edema secondary to chronic lymphedema. He reports he has been wrapping his right leg in trash bags to collect the fluid.  Pt also endorses having diarrhea x1 week after taking laxatives. Pt was transferred to step-down due to worsening hypotension.   Clinical Impression   Miguel Palmer was seen for OT evaluation this date. Prior to hospital admission, pt required assist with BADL/IADL management. His spouse at bedside reports he has become increasingly weaker over the past month and has become more reliant on use of his WC or rollator (which he sits on) for functional mobility. Pt lives with his spouse in a multi-level apartment home, but is able to live on the first floor. Currently pt demonstrates impairments as described below (See OT problem list) which functionally limit his ability to perform ADL/self-care tasks. Pt currently requires MOD A for LB ADL management in seated position as well as MIN A for stand pivot transfers to/from [facility] with MAX cueing for safety during ADL management. Pt would benefit from skilled OT to address noted impairments and functional limitations (see below for any additional details) in order to maximize safety and independence while minimizing falls risk and caregiver burden. Upon hospital discharge, recommend HHOT to maximize pt safety and return to functional independence during meaningful occupations of daily life.      Follow Up Recommendations  Home health OT;Supervision - Intermittent    Equipment Recommendations  3 in 1 bedside commode     Recommendations for Other Services       Precautions / Restrictions Precautions Precautions: Fall Precaution Comments: low BP; on pressors in ICU Restrictions Weight Bearing Restrictions: No      Mobility Bed Mobility Overal bed mobility: Needs Assistance Bed Mobility: Supine to Sit;Sit to Supine     Supine to sit: Supervision Sit to supine: Supervision   General bed mobility comments: heavy effort, aware of lines/leads, but needs author to maintain line safety during mobility.  Transfers Overall transfer level: Needs assistance Equipment used: None Transfers: Sit to/from Stand Sit to Stand: Min guard;Min assist         General transfer comment: pushed up from ICU bed, two arms, able to stand and balance unsupported at minGuard assist for standing BP assessment 70s/30s mmHg    Balance Overall balance assessment: Needs assistance;History of Falls Sitting-balance support: No upper extremity supported;Feet supported Sitting balance-Leahy Scale: Fair     Standing balance support: Single extremity supported;During functional activity Standing balance-Leahy Scale: Fair                             ADL either performed or assessed with clinical judgement   ADL Overall ADL's : Needs assistance/impaired                                       General ADL Comments: Pt is functionally limited by decreased safety awareness, generalized weakness, cardiopulmonary status, and poor insight into deficits. He requires MOD A for  LB ADL management. Min A to close CGA for SPT to [facility]. Min A for STS from [facility].     Vision Baseline Vision/History: Wears glasses Wears Glasses: At all times Patient Visual Report: No change from baseline       Perception     Praxis      Pertinent Vitals/Pain Pain Assessment: No/denies pain     Hand Dominance     Extremity/Trunk Assessment Upper Extremity Assessment Upper Extremity Assessment: Generalized weakness    Lower Extremity Assessment Lower Extremity Assessment: Generalized weakness       Communication Communication Communication: HOH   Cognition Arousal/Alertness: Awake/alert Behavior During Therapy: WFL for tasks assessed/performed Overall Cognitive Status: No family/caregiver present to determine baseline cognitive functioning                                 General Comments: Pt provides conflicting information that wife at bedside corrects. Demonstrates decreased safety awareness and decreased awareness of deficits.   General Comments       Exercises Other Exercises Other Exercises: Pt educated on falls prevention strategies, safe use of AE/DME for ADL management, and routines modifications to support safety and functional independence upon hospital DC. Other Exercises: OT assists pt with functional transfer to [facility]. Pt insistent on using BSC despite education on safety 2/2 poor BP in standing. NT and RN in room to facilitate transfer as well.   Shoulder Instructions      Home Living Family/patient expects to be discharged to:: Private residence Living Arrangements: Spouse/significant other Available Help at Discharge: Family;Available 24 hours/day Type of Home: Apartment Home Access: Level entry     Home Layout: Able to live on main level with bedroom/bathroom;Multi-level;Laundry or work area in basement     [employer] Shower/Tub: Teacher, early years/pre: Standard(Wife reports she just purchased a riser)     Home Equipment: Kasandra Knudsen - single point;Walker - 4 wheels;Wheelchair - Brewing technologist;Toilet riser;Grab bars - tub/shower          Prior Functioning/Environment Level of Independence: Needs assistance  Gait / Transfers Assistance Needed: essentially transfers only, recently more limited. Pt was still performing 16 steps to bedroom up until a month ago (but would take 20 minutes per wife). Pt on O2 at night PTA.              OT Problem  List: Decreased strength;Decreased coordination;Decreased activity tolerance;Decreased safety awareness;Impaired balance (sitting and/or standing);Decreased knowledge of use of DME or AE;Cardiopulmonary status limiting activity;Decreased cognition      OT Treatment/Interventions: Self-care/ADL training;Therapeutic exercise;Therapeutic activities;DME and/or AE instruction;Patient/family education;Balance training;Energy conservation    OT Goals(Current goals can be found in the care plan section) Acute Rehab OT Goals Patient Stated Goal: regain strength to remain independent in mobility OT Goal Formulation: With patient Time For Goal Achievement: 10/27/19 Potential to Achieve Goals: Good ADL Goals Pt Will Perform Grooming: with modified independence;with set-up;with supervision;sitting(LRAD PRN for safety and improved fxl independence.) Pt Will Perform Lower Body Dressing: sit to/from stand;with adaptive equipment;with min guard assist(LRAD PRN for safety and improved fxl independence.) Pt Will Transfer to Toilet: bedside commode;with supervision;stand pivot transfer(LRAD PRN for safety and improved fxl independence.) Pt Will Perform Toileting - Clothing Manipulation and hygiene: sit to/from stand;with set-up;with supervision;with adaptive equipment(LRAD PRN for safety and improved fxl independence.)  OT Frequency: Min 2X/week   Barriers to D/C: Inaccessible home environment  Co-evaluation              AM-PAC OT "6 Clicks" Daily Activity     Outcome Measure Help from another person eating meals?: A Little Help from another person taking care of personal grooming?: A Little Help from another person toileting, which includes using toliet, bedpan, or urinal?: A Little Help from another person bathing (including washing, rinsing, drying)?: A Lot Help from another person to put on and taking off regular upper body clothing?: A Little Help from another person to put on and taking  off regular lower body clothing?: A Lot 6 Click Score: 16   End of Session Equipment Utilized During Treatment: Gait belt Nurse Communication: Mobility status  Activity Tolerance: Patient tolerated treatment well Patient left: Other (comment)(on BSC with NT/RN in room to monitor safety.)  OT Visit Diagnosis: Other abnormalities of gait and mobility (R26.89);Muscle weakness (generalized) (M62.81)                Time: 7425-9563 OT Time Calculation (min): 47 min Charges:  OT General Charges $OT Visit: 1 Visit OT Evaluation $OT Eval Moderate Complexity: 1 Mod OT Treatments $Self Care/Home Management : 38-52 mins  Shara Blazing, M.S., OTR/L Ascom: [phone] 10/13/19, 4:27 PM

## 2019-10-13 NOTE — Progress Notes (Signed)
Daily Progress Note   Patient Name: Miguel Palmer       Date: 10/13/2019 DOB: 07-16-1940  Age: 79 y.o. MRN#: [id number] Attending Physician: Sharen Hones, MD Primary Care Physician: [name], MD Admit Date: 10/11/2019  Reason for Consultation/Follow-up: Establishing goals of care  Subjective: Patient eating a few sips/bites - wife at bedside. No complaints - asking if he can play his ukulele  Length of Stay: 2  Current Medications: Scheduled Meds:  . apixaban  5 mg Oral BID  . Chlorhexidine Gluconate Cloth  6 each Topical Q0600  . collagenase  1 application Topical Daily  . dextroamphetamine  15 mg Oral TID  . feeding supplement (ENSURE ENLIVE)  237 mL Oral BID BM  . feeding supplement (PRO-STAT SUGAR FREE 64)  30 mL Oral TID  . fludrocortisone  0.1 mg Oral BID  . midodrine  10 mg Oral TID WC  . modafinil  300 mg Oral BID  . multivitamin with minerals  1 tablet Oral Daily  . sodium chloride flush  3 mL Intravenous Q12H  . tiotropium  18 mcg Inhalation QHS  . torsemide  20 mg Oral Daily    Continuous Infusions: . sodium chloride    . albumin human Stopped (10/12/19 1915)  . phenylephrine (NEO-SYNEPHRINE) Adult infusion 33.333 mcg/min (10/13/19 0600)  . vancomycin 1,500 mg (10/13/19 1039)    PRN Meds: sodium chloride, acetaminophen, calcium carbonate, ondansetron (ZOFRAN) IV, sodium chloride flush  Physical Exam Constitutional:      General: He is not in acute distress. HENT:     Head: Normocephalic and atraumatic.  Pulmonary:     Effort: Pulmonary effort is normal. No respiratory distress.     Comments: 3L [place] Abdominal:     Palpations: Abdomen is soft.  Musculoskeletal:     Right lower leg: Edema present.     Left lower leg: Edema present.     Comments: Legs wrapped    Skin:    General: Skin is warm and dry.  Neurological:     Mental Status: He is alert.     Comments: Oriented but with periods of confusion, bizarre behavior             Vital Signs: BP (!) 93/57 (BP Location: Right Arm)   Pulse 99   Temp 97.6 F (36.4 C) (Oral)   Resp 13   Ht 6' 2"  (1.88 m)   Wt 119.9 kg   SpO2 100%   BMI 33.94 kg/m  SpO2: SpO2: 100 % O2 Device: O2 Device: Nasal Cannula O2 Flow Rate: O2 Flow Rate (L/min): 2 L/min  Intake/output summary:   Intake/Output Summary (Last 24 hours) at 10/13/2019 1118 Last data filed at 10/13/2019 1028 Gross per 24 hour  Intake 1084.98 ml  Output 1400 ml  Net -315.02 ml   LBM: Last BM Date: 10/12/19 Baseline Weight: Weight: 125.2 kg Most recent weight: Weight: 119.9 kg       Palliative Assessment/Data: PPS 20%    Flowsheet Rows     Most Recent Value  Intake Tab  Referral Department  Hospitalist  Unit at Time of Referral  ER  Palliative Care Primary Diagnosis  Sepsis/Infectious Disease  Date Notified  10/11/19  Palliative Care Type  New Palliative care  Reason for referral  Clarify Goals of Care  Date of Admission  10/11/19  Date first seen by Palliative Care  10/12/19  # of days Palliative referral response time  1 Day(s)  # of days IP prior to Palliative referral  0  Clinical Assessment  Palliative Performance Scale Score  20%  Psychosocial & Spiritual Assessment  Palliative Care Outcomes  Patient/Family meeting held?  Yes  Who was at the meeting?  patient and wife  Palliative Care Outcomes  Clarified goals of care, Completed durable DNR, Changed CPR status, Provided psychosocial or spiritual support      Patient Active Problem List   Diagnosis Date Noted  . Malnutrition of moderate degree 10/12/2019  . Hypotension   . Goals of care, counseling/discussion   . DNR (do not resuscitate)   . CHF (congestive heart failure) ([place]) 10/11/2019  . HCAP (healthcare-associated pneumonia) 06/24/2016  . Community  acquired pneumonia 05/17/2016  . Chest pain 05/17/2016  . Diabetes mellitus without complication ([place]) 82/50/5397  . Chronic bilateral low back pain with right-sided sciatica 05/01/2016  . Long term current use of anticoagulant therapy 05/01/2016  . Chronic pain of right lower extremity 05/01/2016  . Spinal stenosis of lumbar region 05/01/2016  . Other spondylosis with radiculopathy, lumbar region 05/01/2016  . Spondylolisthesis of lumbar region 05/01/2016  . Chronic pain syndrome 05/01/2016  . Hypoxemia requiring supplemental oxygen 12/27/2015  . Morbid obesity with BMI of 40.0-44.9, adult ([place]) 11/22/2015  . Chronic respiratory failure with hypoxia ([place]) 09/05/2015  . Pulmonary fibrosis, unspecified ([place]) 09/05/2015  . Restrictive airway disease 06/20/2015  . Chronic obstructive pulmonary disease ([place]) 04/01/2015  . Mixed hyperlipidemia 01/05/2015  . Non-ST elevation myocardial infarction (NSTEMI), subendocardial infarction, subsequent episode of care ([place]) 01/05/2015  . Benign essential hypertension 01/04/2015  . HLD (hyperlipidemia) 10/31/2014  . Heart attack ([place]) 10/31/2014  . Difficulty hearing 03/17/2014  . Abnormal gait 10/09/2013  . Obstructive apnea 08/30/2012  . 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 Prostate cancer screening 09/23/2011  . Vitamin D deficiency 09/23/2011  . Arteriosclerosis of coronary artery 09/04/2011  . CAD (coronary artery disease) 09/04/2011  . Allergic rhinitis 06/25/2011  . 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 Lower extremity weakness 03/30/2011  . Neuropathy ([place]) 01/17/2011    Palliative Care Assessment & Plan   HPI: 79 y.o. male  with past medical history of T2DM, COPD, CAD, chronic lymphedema, pulmonary fibrosis, and narcolepsy admitted on 10/11/2019 with anasarca and hypotension.  Hypotension secondary to cardiogenic vs sepsis vs dehydration. Patient with elevated troponin likely d/t demand ischemia in the setting of acute illness. Patient with  possible RLE infection. Patient has been started on neosynephrine drip. PMT consulted for [place].  Assessment: Discussed with Dr. Lanney Gins and RN. Plans for abd [place] and echo today. Patient remains dependent on neo-synephrine. BP low.   Discussed care with patient and wife - patient seems more confused to me today; however, wife feels he is about the same as yesterday. He does have moments of clarity but then will make comments that do not align with current situation.  They are focused on going home - we discussed patient is too unstable at this point to go home and is still undergoing full work-up of his medical problems.  Plans remain to continue current measures with limit and DNR and see how patient responds.   Recommendations/Plan:  PMT will continue to follow for ongoing [place] and patient/family education  Code  Status:  DNR  Prognosis:   Unable to determine  Discharge Planning:  To Be Determined - they hope to go home but not opposed to short term rehab if strongly recommended, would likely benefit from OP palliative follow up at discharge  Care plan was discussed with Dr. Lanney Gins, RN, patient and wife  Thank you for allowing the Palliative Medicine Team to assist in the care of this patient.   Total Time 20 minutes Prolonged Time Billed  no       Greater than 50%  of this time was spent counseling and coordinating care related to the above assessment and plan.  Juel Burrow, DNP, [facility] Palliative Medicine Team Team Phone # [phone]  Pager [phone]

## 2019-10-13 NOTE — Progress Notes (Addendum)
Pharmacy Antibiotic Note  Miguel Palmer is a 79 y.o. male admitted on 10/11/2019. Patient with chronic weeping in right leg. Pharmacy has been consulted for vancomycin dosing.  Plan: Change vancomycin to 1500 mg IV q24hr Goal AUC 400-550 Expected AUC: 483 SCr used: 1.22  Meropenem 1 g q8h added for acinetobacter coverage for now   Height: 6' 2"  (188 cm) Weight: 119.9 kg (264 lb 5.3 oz) IBW/kg (Calculated) : 82.2  Temp (24hrs), Avg:98 F (36.7 C), Min:97.6 F (36.4 C), Max:98.3 F (36.8 C)  Recent Labs  Lab 10/11/19 1249 10/11/19 1507 10/12/19 0427 10/12/19 0624 10/13/19 0521  WBC 6.9  --   --  7.6 5.7  CREATININE 1.99*  --  1.73*  --  1.22  LATICACIDVEN 2.2* 1.6  --  1.9  --     Estimated Creatinine Clearance: 67.6 mL/min (by C-G formula based on SCr of 1.22 mg/dL).    Allergies  Allergen Reactions  . Bee Pollen Shortness Of Breath    Post nasal drip  . Pollen Extract Shortness Of Breath    Post nasal drip Post nasal drip  . Codeine Other (See Comments)  . Other Other (See Comments)    pepper  . [place] Other (See Comments)  . Morphine Itching    (+) itching and redness noted to IV insertion site following administration. See ED nurses note from 11/25 at 0449. (+) itching and redness noted to IV insertion site following administration. See ED nurses note from 11/25 at 0449.   Marland Kitchen Morphine And Related Itching    (+) itching and redness noted to IV insertion site following administration. See ED nurses note from 11/25 at 0449.    Antimicrobials this admission: Vancomycin 4/21 >> Meropenem 4/22 >>  Dose adjustments this admission: 4/22 Vanc 1750 mg q36h >> 1500 mg q24h   Microbiology results: 4/20 BCx: NGTD 4/21 Wound Cx: GPC, acinetobacter 4/21 MRSA PCR: negative  Thank you for allowing pharmacy to be a part of this patient's care.  Tawnya Crook, PharmD 10/13/2019 11:37 AM

## 2019-10-13 NOTE — Progress Notes (Signed)
Pt in no acute distress this shift. Neo titrated per orders to sustain MAP >65. Bilateral lower extremity dressings changed per orders. Pt refused provigil and Dexedrine this evening. Educated.

## 2019-10-13 NOTE — Consult Note (Signed)
[facility] Cardiology  CARDIOLOGY CONSULT NOTE  Patient ID: Miguel Palmer MRN: [id number] DOB/AGE: [birth year] 79 y.o.  Admit date: 10/11/2019 Referring Physician Roosevelt Locks Primary Physician [name] Primary Cardiologist Nehemiah Massed Reason for Consultation hypotension, acute on chronic diastolic congestive heart failure, atrial fibrillation  HPI: Patient admitted on 10/11/2019 for anasarca and hypertension.  Patient has COPD, on supplemental oxygen at night.  He has chronic peripheral edema with nonhealing ulcer and right lower extremity.  On day of admission, blood pressure 88/40.  Admission BUN and creatinine were 47 and 1.99, respectively.  Patient denies chest pain.  High-sensitivity troponin was 72 and 63 on day of admission.  ECG revealed atrial fibrillation with right bundle branch block.  Chest x-ray revealed chronic interstitial disease without acute infiltrates.  The patient is being treated with broad-spectrum antibiotics, phenylephrine drip, and oral torsemide.  Patient currently denies chest pain or shortness of breath.  2D echocardiogram revealed normal left ventricular function, with LVEF of 60 to 65%, moderate aortic stenosis with calculated aortic valve area 0.72 cm, mean gradient 20.3 mmHg, peak gradient 31.6 mmHg, RV and right atrial enlargement consistent with COPD and cor pulmonale.  This appears consistent with prior 2D echocardiogram from 05/04/2019.  Patient on Eliquis for stroke prevention.  Review of systems complete and found to be negative unless listed above     Past Medical History:  Diagnosis Date  . Arteriosclerosis    7 stents placed no s/s x 7 years  . Biceps tendinitis 05/29/2014  . COPD (chronic obstructive pulmonary disease) ([place])   . Diabetes mellitus without complication ([place])   . Disorder of rotator cuff 05/29/2014  . Edema    bilateral lower extremities  . Frank hematuria 04/28/2013  . Memory change 01/13/2014  . Narcolepsy   . Obesity   . 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 Oxygen deficiency     Past Surgical History:  Procedure Laterality Date  . CORONARY STENT PLACEMENT    . KNEE ARTHROSCOPY Right     Medications Prior to Admission  Medication Sig Dispense Refill Last Dose  . acetaminophen (TYLENOL) 325 MG tablet Take 2 tablets (650 mg total) by mouth every 6 (six) hours as needed for mild pain (or Fever >/= 101).   10/10/2019 at PRN  . ascorbic acid (VITAMIN C) 500 MG tablet Take 500 mg by mouth 3 (three) times daily.    10/10/2019 at 1200  . chlorhexidine (PERIDEX) 0.12 % solution Use as directed 15 mLs in the mouth or throat 2 (two) times daily. After breakfast and before bedtime   Past Week at Unknown time  . Cholecalciferol (VITAMIN D3) 50 MCG (2000 UT) capsule Take 2,000 Units by mouth daily.   10/10/2019 at 1000  . Coenzyme Q10 100 MG TABS Take 100 mg by mouth 4 (four) times daily.    10/10/2019 at 1200  . dextroamphetamine (DEXEDRINE SPANSULE) 15 MG 24 hr capsule Take 1 capsule by mouth 3 (three) times daily.   0 10/11/2019 at 0800  . DHA-PHOSPHATIDYLSERINE PO Take 100 mg by mouth in the morning, at noon, and at bedtime.    10/10/2019 at 1200  . ELIQUIS 5 MG TABS tablet Take 5 mg by mouth 2 (two) times daily.   10/11/2019 at 0800  . gabapentin (NEURONTIN) 400 MG capsule Take 400 mg by mouth 4 (four) times daily.   10/11/2019 at 0800  . Ginkgo Biloba 60 MG TABS Take 60 mg by mouth 2 (two) times daily.    10/10/2019  at Unknown time  . Glucosamine Sulfate 1000 MG CAPS Take 1,000 mg by mouth 2 (two) times daily.    10/10/2019 at Unknown time  . guaiFENesin (MUCINEX) 600 MG 12 hr tablet Take 600 mg by mouth 2 (two) times daily as needed.    Past Week at PRN  . metoprolol succinate (TOPROL-XL) 50 MG 24 hr tablet Take 50 mg by mouth daily. TAKE 1 TABLET BY MOUTH EVERY DAY   10/11/2019 at 0800  . modafinil (PROVIGIL) 100 MG tablet Take 300 mg by mouth daily.    10/11/2019 at 0800  . Multiple Vitamins-Minerals (MULTIVITAMIN & MINERAL  PO) Take by mouth.   10/10/2019 at 1200  . nitroGLYCERIN (NITROSTAT) 0.4 MG SL tablet Place 0.4 mg under the tongue every 5 (five) minutes as needed for chest pain.    PRN at PRN  . PHOSPHATIDYLSERINE PO Take 60 mg by mouth 3 (three) times daily.   10/10/2019 at 1200  . rosuvastatin (CRESTOR) 20 MG tablet Take 20 mg by mouth at bedtime.    10/10/2019 at 2000  . tiotropium (SPIRIVA) 18 MCG inhalation capsule Place 1 capsule (18 mcg total) into inhaler and inhale daily. 30 capsule 0 10/10/2019 at 1200  . torsemide (DEMADEX) 20 MG tablet Take 2 tablets (27m) by mouth once daily.   10/10/2019 at 1200   Social History   Socioeconomic History  . Marital status: Married    Spouse name: Not on file  . Number of children: Not on file  . Years of education: Not on file  . Highest education level: Not on file  Occupational History  . Occupation: retired  Tobacco Use  . Smoking status: Former Smoker    Packs/day: 2.00    Years: 22.00    Pack years: 44.00    Types: Cigarettes  . Smokeless tobacco: Never Used  Substance and Sexual Activity  . Alcohol use: No    Alcohol/week: 0.0 standard drinks  . Drug use: No  . Sexual activity: Not on file  Other Topics Concern  . Not on file  Social History Narrative  . Not on file   Social Determinants of Health   Financial Resource Strain:   . Difficulty of Paying Living Expenses:   Food Insecurity:   . Worried About RCharity fundraiserin the Last Year:   . RArboriculturistin the Last Year:   Transportation Needs:   . LFilm/video editor(Medical):   .Marland KitchenLack of Transportation (Non-Medical):   Physical Activity:   . Days of Exercise per Week:   . Minutes of Exercise per Session:   Stress:   . Feeling of Stress :   Social Connections:   . Frequency of Communication with Friends and Family:   . Frequency of Social Gatherings with Friends and Family:   . Attends Religious Services:   . Active Member of Clubs or Organizations:   . Attends CEnglish as a second language teacherMeetings:   .Marland KitchenMarital Status:   Intimate Partner Violence:   . Fear of Current or Ex-Partner:   . Emotionally Abused:   .Marland KitchenPhysically Abused:   . Sexually Abused:     Family History  Problem Relation Age of Onset  . Dementia Mother   . Asthma Father   . Emphysema Father   . Alzheimer's disease Father       Review of systems complete and found to be negative unless listed above      PHYSICAL EXAM  General:  Well developed, well nourished, in no acute distress HEENT:  Normocephalic and atramatic Neck:  No JVD.  Lungs: Clear bilaterally to auscultation and percussion. Heart: HRRR . Normal S1 and S2 without gallops or murmurs.  Abdomen: Bowel sounds are positive, abdomen soft and non-tender  Msk:  Back normal, normal gait. Normal strength and tone for age. Extremities: No clubbing, cyanosis or edema.   Neuro: Alert and oriented X 3. Psych:  Good affect, responds appropriately  Labs:   Lab Results  Component Value Date   WBC 5.7 10/13/2019   HGB 8.7 (L) 10/13/2019   HCT 28.8 (L) 10/13/2019   MCV 84.0 10/13/2019   PLT 154 10/13/2019    Recent Labs  Lab 10/11/19 1249 10/12/19 0427 10/13/19 0521  NA 131*   < > 140  K 4.6   < > 3.8  CL 93*   < > 100  CO2 27   < > 32  BUN 47*   < > 31*  CREATININE 1.99*   < > 1.22  CALCIUM 8.6*   < > 8.6*  PROT 5.8*  --   --   BILITOT 2.4*  --   --   ALKPHOS 101  --   --   ALT 25  --   --   AST 40  --   --   GLUCOSE 95   < > 108*   < > = values in this interval not displayed.   Lab Results  Component Value Date   TROPONINI <0.03 06/24/2016    Lab Results  Component Value Date   CHOL 94 05/18/2016   Lab Results  Component Value Date   HDL 47 05/18/2016   Lab Results  Component Value Date   LDLCALC 35 05/18/2016   Lab Results  Component Value Date   TRIG 59 05/18/2016   Lab Results  Component Value Date   CHOLHDL 2.0 05/18/2016   No results found for: LDLDIRECT    Radiology: DG Chest 1  View  Result Date: 10/12/2019 CLINICAL DATA:  Shortness of breath EXAM: CHEST  1 VIEW COMPARISON:  10/11/2019 FINDINGS: Heart is normal size. Interstitial prominence again noted throughout the lungs compatible with chronic interstitial lung disease. This is similar to prior study. No effusions. No acute bony abnormality. IMPRESSION: Stable exam.  Chronic interstitial prominence. Electronically Signed   By: Rolm Baptise M.D.   On: 10/12/2019 01:35   US RENAL  Result Date: 10/12/2019 CLINICAL DATA:  Acute renal insufficiency EXAM: RENAL / URINARY TRACT ULTRASOUND COMPLETE COMPARISON:  None. FINDINGS: Right Kidney: Renal measurements: 9.9 x 5.1 by 5.2 cm = volume: 138 mL . Echogenicity within normal limits. No mass or hydronephrosis visualized. Left Kidney: Renal measurements: 11.4 x 6.0 by 5.1 cm = volume: 183 mL. Echotexture is normal. There is a nonobstructing 7 mm calculus within the upper pole. No hydronephrosis. Bladder: Bladder is moderately distended without filling defect. Other: Small right pleural effusion is noted.  There is trace ascites. IMPRESSION: 1. Nonobstructing 7 mm left renal calculus. 2. Otherwise unremarkable appearance of the kidneys. 3. Small right pleural effusion. 4. Trace ascites. Electronically Signed   By: Randa Ngo M.D.   On: 10/12/2019 17:31   DG Chest Portable 1 View  Result Date: 10/11/2019 CLINICAL DATA:  Hypotension. Weakness. Congestive heart failure. EXAM: PORTABLE CHEST 1 VIEW COMPARISON:  Chest x-rays dated 06/24/2016 and 05/17/2016 FINDINGS: The heart size and pulmonary vascularity are normal. The azygos vein is slightly distended chronically which suggest elevated  right heart pressure. There is slight chronic accentuation of the interstitial markings at the lung bases and in the mid zones. No acute infiltrates or effusions. No acute bone abnormality. IMPRESSION: 1. No acute abnormality. 2. Chronic interstitial disease. 3. Chronic distention of the azygos vein  suggesting elevated right heart pressure. Electronically Signed   By: Lorriane Shire M.D.   On: 10/11/2019 15:10   ECHOCARDIOGRAM COMPLETE  Result Date: 10/13/2019    ECHOCARDIOGRAM REPORT   Patient Name:   PER BEAGLEY Date of Exam: 10/13/2019 Medical Rec #:  [id number]            Height:       74.0 in Accession #:    [id number]           Weight:       264.3 lb Date of Birth:  [birth year]             BSA:          2.447 m Patient Age:    37 years             BP:           93/57 mmHg Patient Gender: M                    HR:           99 bpm. Exam Location:  [facility] Procedure: 2D Echo, Color Doppler and Cardiac Doppler Indications:     Dyspnea 786.09  History:         Patient has prior history of Echocardiogram examinations, most                  recent 05/18/2016. COPD; Risk Factors:Diabetes. Edema.  Sonographer:     Sherrie Sport RDCS (AE) Referring Phys:  [place] Diagnosing Phys: Isaias Cowman MD  Sonographer Comments: Image acquisition challenging due to COPD. IMPRESSIONS  1. Left ventricular ejection fraction, by estimation, is 60 to 65%. The left ventricle has normal function. The left ventricle has no regional wall motion abnormalities. There is mild left ventricular hypertrophy. Left ventricular diastolic parameters were normal.  2. Right ventricular systolic function is normal. The right ventricular size is moderately enlarged. There is severely elevated pulmonary artery systolic pressure.  3. Right atrial size was moderately dilated.  4. The mitral valve is normal in structure. Mild to moderate mitral valve regurgitation. No evidence of mitral stenosis.  5. Tricuspid valve regurgitation is moderate.  6. The aortic valve is normal in structure. Aortic valve regurgitation is not visualized. Moderate aortic valve stenosis.  7. The inferior vena cava is normal in size with greater than 50% respiratory variability, suggesting right atrial pressure of 3 mmHg. FINDINGS  Left Ventricle: Left  ventricular ejection fraction, by estimation, is 60 to 65%. The left ventricle has normal function. The left ventricle has no regional wall motion abnormalities. The left ventricular internal cavity size was normal in size. There is  mild left ventricular hypertrophy. Left ventricular diastolic parameters were normal. Right Ventricle: The right ventricular size is moderately enlarged. No increase in right ventricular wall thickness. Right ventricular systolic function is normal. There is severely elevated pulmonary artery systolic pressure. The tricuspid regurgitant velocity is 3.90 m/s, and with an assumed right atrial pressure of 10 mmHg, the estimated right ventricular systolic pressure is 50.2 mmHg. Left Atrium: Left atrial size was normal in size. Right Atrium: Right atrial size was moderately dilated. Pericardium: There is no evidence of  pericardial effusion. Mitral Valve: The mitral valve is normal in structure. Normal mobility of the mitral valve leaflets. Mild to moderate mitral valve regurgitation. No evidence of mitral valve stenosis. Tricuspid Valve: The tricuspid valve is normal in structure. Tricuspid valve regurgitation is moderate . No evidence of tricuspid stenosis. Aortic Valve: The aortic valve is normal in structure. Aortic valve regurgitation is not visualized. Moderate aortic stenosis is present. Aortic valve mean gradient measures 20.3 mmHg. Aortic valve peak gradient measures 31.6 mmHg. Aortic valve area, by VTI measures 0.70 cm. Pulmonic Valve: The pulmonic valve was normal in structure. Pulmonic valve regurgitation is not visualized. No evidence of pulmonic stenosis. Aorta: The aortic root is normal in size and structure. Venous: The inferior vena cava is normal in size with greater than 50% respiratory variability, suggesting right atrial pressure of 3 mmHg. IAS/Shunts: No atrial level shunt detected by color flow Doppler.  LEFT VENTRICLE PLAX 2D LVIDd:         2.85 cm  Diastology LVIDs:          2.00 cm  LV e' lateral:   16.80 cm/s LV PW:         1.27 cm  LV E/e' lateral: 5.4 LV IVS:        1.58 cm  LV e' medial:    5.77 cm/s LVOT diam:     2.10 cm  LV E/e' medial:  15.7 LV SV:         53 LV SV Index:   22 LVOT Area:     3.46 cm  RIGHT VENTRICLE RV Basal diam:  5.43 cm RV S prime:     5.66 cm/s TAPSE (M-mode): 2.7 cm LEFT ATRIUM           Index       RIGHT ATRIUM           Index LA diam:      5.10 cm 2.08 cm/m  RA Area:     25.70 cm LA Vol (A4C): 60.2 ml 24.60 ml/m RA Volume:   93.30 ml  38.13 ml/m  AORTIC VALVE                    PULMONIC VALVE AV Area (Vmax):    0.85 cm     PV Vmax:        0.66 m/s AV Area (Vmean):   0.75 cm     PV Peak grad:   1.8 mmHg AV Area (VTI):     0.70 cm     RVOT Peak grad: 2 mmHg AV Vmax:           281.00 cm/s AV Vmean:          211.667 cm/s AV VTI:            0.753 m AV Peak Grad:      31.6 mmHg AV Mean Grad:      20.3 mmHg LVOT Vmax:         68.70 cm/s LVOT Vmean:        45.700 cm/s LVOT VTI:          0.153 m LVOT/AV VTI ratio: 0.20  AORTA Ao Root diam: 2.80 cm MITRAL VALVE               TRICUSPID VALVE MV Area (PHT): 3.68 cm    TR Peak grad:   60.8 mmHg MV Decel Time: 206 msec    TR Vmax:  390.00 cm/s MV E velocity: 90.70 cm/s MV A velocity: 39.80 cm/s  SHUNTS MV E/A ratio:  2.28        Systemic VTI:  0.15 m                            Systemic Diam: 2.10 cm Isaias Cowman MD Electronically signed by Isaias Cowman MD Signature Date/Time: 10/13/2019/4:02:10 PM    Final    US Abdomen Limited RUQ  Result Date: 10/13/2019 CLINICAL DATA:  Patient with NASH. EXAM: ULTRASOUND ABDOMEN LIMITED RIGHT UPPER QUADRANT COMPARISON:  None. FINDINGS: Gallbladder: Decompressed. Mild wall thickening measuring up to 5 mm. No cholelithiasis. Common bile duct: Diameter: 4 mm Liver: Increased in echogenicity. Portal vein is patent on color Doppler imaging with normal direction of blood flow towards the liver. Other: Moderate volume ascites. IMPRESSION: Mild  gallbladder wall thickening which is nonspecific in etiology. No cholelithiasis or additional signs to suggest acute cholecystitis. Moderate volume ascites. Hepatic steatosis. Electronically Signed   By: Lovey Newcomer M.D.   On: 10/13/2019 10:44    EKG: Atrial fibrillation with right bundle branch block  ASSESSMENT AND PLAN:   1.  Hypotension, currently on phenylephrine drip, tolerated well, patient mentating well, etiology likely multifactorial, due to poor overall health, with normal overall LV function. 2.  Anasarca, multifactorial, primarily due to poor overall health, kidney disease, right-sided heart failure, with normal left ventricular functio, double cardiorenal syndrome. 3.  Chronic atrial fibrillation, on Eliquis for stroke prevention 4.  Borderline elevated high-sensitivity troponin, in the absence of chest pain, likely demand supply ischemia 5.  Acute on chronic kidney disease, followed by nephrology 6.  Moderate aortic stenosis, of uncertain clinical significance, patient not a candidate for TAVR or SAVR  Recommendations  1.  Agree with overall current therapy 2.  Defer full dose anticoagulation 3.  Defer further cardiac diagnostics at this time 4.  Continue Eliquis for stroke prevention 5.  Continue torsemide for gentle diuresis 6.  Carefully monitor renal status 7.  Continue phenylephrine drip for now 8.  No further recommendations at this time   Signed: Isaias Cowman MD,PhD, [facility] 10/13/2019, 4:15 PM

## 2019-10-13 NOTE — Progress Notes (Signed)
*  PRELIMINARY RESULTS* Echocardiogram 2D Echocardiogram has been performed.  Sherrie Sport 10/13/2019, 11:08 AM

## 2019-10-13 NOTE — Progress Notes (Signed)
OT Cancellation Note  Patient Details Name: Miguel Palmer MRN: [id number] DOB: 1941/02/06   Cancelled Treatment:    Reason Eval/Treat Not Completed: Medical issues which prohibited therapy;Other (comment). OT continues to follow this pt. Discussed potential evaluation with pt primary RN this date. RN informs this [place]ia that pt is pending liver scan at bedside this am. RN anticipates their arrival immanently. Will hold evaluation at this time and continue to follow pt remotely. Plan to initiate services as available and pt medically appropriate.   Shara Blazing, M.S., OTR/L Ascom: [phone] 10/13/19, 9:19 AM

## 2019-10-13 NOTE — Progress Notes (Signed)
   10/13/19 1436  Therapy Vitals  Patient Position (if appropriate) Orthostatic Vitals  Orthostatic Lying   BP- Lying (!) 93/39  Orthostatic Sitting  BP- Sitting (!) 84/66  Orthostatic Standing at 0 minutes  BP- Standing at 0 minutes (!) 74/35 (asymptomatic)  Pulse- Standing at 0 minutes 106  Oxygen Therapy  SpO2 (!) 88 % (standing)  O2 Device Room Air   Orthos taken during PT session. Pt has been hypotensive, still on pressors for support. Pt grossly asymptomatic standing for 90 seconds, however author elects to defer AMB assessment until vitals are in a more appropriate range.   2:51 PM, 10/13/19 Etta Grandchild, PT, DPT Physical Therapist - [facility]  [phone] [facility])

## 2019-10-13 NOTE — Evaluation (Signed)
Physical Therapy Evaluation Patient Details Name: Miguel Palmer MRN: [id number] DOB: December 12, 1940 Today's Date: 10/13/2019   History of Present Illness  Miguel Palmer  is a 79 y.o. male with a known history of history of diabetes, COPD, CAD obesity who presents from [facility] with hypotension and concern of sepsis.  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  Pt also endorses having diarrhea x1 week after taking laxatives. Pt was transferred to step-down due to worsening hypotension.  Clinical Impression  Pt admitted with above diagnosis. Pt currently with functional limitations due to the deficits listed below (see "PT Problem List"). Upon entry, pt in bed, awake and agreeable to participate. Pt finishing lunch, wife in room. The pt is alert and oriented x4, pleasant, conversational, and generally a fair historian, at time somewhat confused but largely intact. Pt's legs are wrapped from ankle to calf to help with fluid and weeping. Pt still has pitting in the thighs fairly close to the pelvis. Wife feels that pt exaggerates recent independence with ADL/IADL Pt needs moderate to heavy effort to perform bed mobility and tranfers, is able to stand unsupported for <2 minutes. AMB deferred due to continuously low BP which is worse with standing, pt still on pressor support. Functional mobility assessment demonstrates increased effort/time requirements, poor tolerance, but no need for physical assistance, whereas the patient performed these at a slightly higher level of independence PTA about 1 month ago or longer. Managing fluid overload in this patient has been very difficult since admission with multifactorial ramifications that includes LEE/weakness, DOE and need for >baseline O2, and now  sustained hypotension. Chief Strategy Officer educated pt and wife regarding multi system limitations present and the chronic and progressive nature of these limitations, however neither really understand the overall message that pt will likely have fluctuating and limited mobility independence going forward. Author encouraged both that living at home is possible, but they should be prepared for nonambulatory days each week wherein a W/C would be the most appropriate and safe method of mobility in home. Pt has had a drastic decline in mobility in the last few months, a decline that no doubt feels temporary to pt/family, and is mediated by relatively new diagnoses to pt/wife that will continue to require education for full understanding. Both also assured that PT could be of benefit to maximize functional capacity. Pt will benefit from skilled PT intervention to increase independence and safety with basic mobility in preparation for discharge to the venue listed below.       Follow Up Recommendations Home health PT;Supervision for mobility/OOB;Supervision - Intermittent    Equipment Recommendations  3in1 (PT);Other (comment)(Would benefit from motorized scooter for out of home mobility independence, but this needs to be set up with PCP)    Recommendations for Other Services       Precautions / Restrictions Precautions Precautions: Fall Precaution Comments: low BP; on pressors in ICU Restrictions Weight Bearing Restrictions: No      Mobility  Bed Mobility Overal bed mobility: Needs Assistance Bed Mobility: Supine to Sit;Sit to Supine     Supine to sit: Supervision Sit to supine: Supervision   General bed mobility comments: heavy effort, aware of lines/leads, but needs author to maintain line safety during mobility.  Transfers Overall transfer level: Needs assistance Equipment used:  None Transfers: Sit to/from Stand Sit to Stand: Min guard         General transfer comment: pushed up from ICU  bed, two arms, able to stand and balance unsupported at minGuard assist for standing BP assessment 70s/30s mmHg  Ambulation/Gait Ambulation/Gait assistance: (Hypotensive precluding, largely deferred but takes a few small steps to left to center torso with bed)              Stairs            Wheelchair Mobility    Modified Rankin (Stroke Patients Only)       Balance Overall balance assessment: Modified Independent;Needs assistance;History of Falls Sitting-balance support: No upper extremity supported;Feet supported Sitting balance-Leahy Scale: Good     Standing balance support: No upper extremity supported Standing balance-Leahy Scale: Good                               Pertinent Vitals/Pain Pain Assessment: No/denies pain    Home Living Family/patient expects to be discharged to:: Private residence Living Arrangements: Spouse/significant other   Type of Home: Apartment Home Access: Level entry     Home Layout: Able to live on main level with bedroom/bathroom;Multi-level Home Equipment: Cane - single point;Walker - 4 wheels;Wheelchair - manual;Shower seat      Prior Function Level of Independence: Needs assistance   Gait / Transfers Assistance Needed: essentially transfers only, recently more limited. Pt was still performing 16 steps to bedroom up until a month ago (but would take 20 minutes per wife). Pt on O2 at night PTA.           Hand Dominance        Extremity/Trunk Assessment   Upper Extremity Assessment Upper Extremity Assessment: Generalized weakness;Overall [facility] for tasks assessed    Lower Extremity Assessment Lower Extremity Assessment: Generalized weakness;Overall WFL for tasks assessed       Communication      Cognition Arousal/Alertness: Awake/alert Behavior During Therapy: WFL for tasks assessed/performed Overall Cognitive Status: Within Functional Limits for tasks assessed                                         General Comments      Exercises     Assessment/Plan    PT Assessment Patient needs continued PT services  PT Problem List Decreased strength;Decreased activity tolerance;Decreased cognition;Decreased range of motion;Decreased balance;Decreased mobility;Cardiopulmonary status limiting activity       PT Treatment Interventions DME instruction;Gait training;Balance training;Stair training;Functional mobility training;Therapeutic activities;Therapeutic exercise;Patient/family education    PT Goals (Current goals can be found in the Care Plan section)  Acute Rehab PT Goals Patient Stated Goal: regain strength to remain independent in mobility PT Goal Formulation: With patient Time For Goal Achievement: 10/27/19 Potential to Achieve Goals: Fair    Frequency Min 2X/week   Barriers to discharge        Co-evaluation               AM-PAC PT "6 Clicks" Mobility  Outcome Measure Help needed turning from your back to your side while in a flat bed without using bedrails?: None Help needed moving from lying on your back to sitting on the side of a flat bed without using bedrails?: None Help needed moving to and from a bed to a chair (including a wheelchair)?:  A Little Help needed standing up from a chair using your arms (e.g., wheelchair or bedside chair)?: A Little Help needed to walk in hospital room?: A Lot Help needed climbing 3-5 steps with a railing? : A Lot 6 Click Score: 18    End of Session Equipment Utilized During Treatment: Oxygen Activity Tolerance: Patient tolerated treatment well;Treatment limited secondary to medical complications (Comment) Patient left: in bed;with family/visitor present;with call bell/phone within reach Nurse Communication: Mobility status PT Visit Diagnosis: Other abnormalities of gait and mobility (R26.89);Dizziness and giddiness (R42);Difficulty in walking, not elsewhere classified (R26.2);Muscle weakness (generalized)  (M62.81)    Time: 0931-1216 PT Time Calculation (min) (ACUTE ONLY): 46 min   Charges:   PT Evaluation $PT Eval High Complexity: 1 High PT Treatments $Self Care/Home Management: 23-37        3:09 PM, 10/13/19 Etta Grandchild, PT, DPT Physical Therapist - [facility]  [phone] ([place])    Delphina Schum C 10/13/2019, 2:58 PM

## 2019-10-13 NOTE — Consult Note (Signed)
Name: Montey Ebel MRN: [id number] DOB: 16-Nov-1940    ADMISSION DATE:  10/11/2019 CONSULTATION DATE: 10/12/2019  REFERRING MD : Rufina Falco, NP   CHIEF COMPLAINT: Hypotension   BRIEF PATIENT DESCRIPTION:  79 yo male admitted with hypotension concerning for sepsis vs cardiogenic shock, hypovolemia secondary to diarrhea, and chronic lymphedema requiring lasix gtt requiring transfer to the stepdown unit due to worsening hypotension   SIGNIFICANT EVENTS/STUDIES:  04/20: Pt admitted to the progressive care unit with lasix gtt infusing 04/21: Pt developed hypotension requiring transfer to the stepdown unit.  Lasix gtt discontinued and pt received iv albumin x1 dose   HISTORY OF PRESENT ILLNESS:   This is a 79 yo male with a PMH of Obesity, Testicular Hypofunction, Narcolepsy, Memory Change, Frank Hematuria, Chronic Lymphoedema, Type II Diabetes Mellitus, COPD, Bicep Tendinitis, and Arteriosclerosis.  He presented to [facility] ER on 04/20 from [facility] with hypotension and concern of sepsis.  It was also reported the pt had multiple RLE wounds and worsening bilateral lower extremity edema secondary to chronic lymphedema. He reports he has been wrapping his right leg in trash bags to collect the fluid.  He also reported having diarrhea x1 week after taking laxatives. In the ER initial bp 80/50, however after receiving 700 ml NS bolus bp increased to 92/60.  In the ER lab results revealed Na+ 131, chloride 93, BUN 47, creatinine 1.00, calcium 8.6, albumin 2.8, BNP 1,906, troponin 72, and hgb 8.4.  Influenza PCR/COVID-19 negative and CXR revealed no acute abnormality.  He received 500 ml NS bolus with improvement of bp. He was admitted to the progressive care unit per hospitalist team and lasix gtt initiated.  However, on 04/21 pt developed hypotension requiring transfer to the stepdown unit PCCM team consulted to assist with management.   PAST MEDICAL HISTORY :   has a past medical  history of Arteriosclerosis, Biceps tendinitis (05/29/2014), COPD (chronic obstructive pulmonary disease) ([place]), Diabetes mellitus without complication ([place]), Disorder of rotator cuff (05/29/2014), Edema, Pilar Plate hematuria (04/28/2013), Memory change (01/13/2014), Narcolepsy, Obesity, Other testicular hypofunction (03/30/2011), and Oxygen deficiency.  has a past surgical history that includes Coronary stent placement and Knee arthroscopy (Right). Prior to Admission medications   Medication Sig Start Date End Date Taking? Authorizing Provider  acetaminophen (TYLENOL) 325 MG tablet Take 2 tablets (650 mg total) by mouth every 6 (six) hours as needed for mild pain (or Fever >/= 101). 06/27/16  Yes Gouru, Illene Silver, MD  ascorbic acid (VITAMIN C) 500 MG tablet Take 500 mg by mouth 3 (three) times daily.    Yes [provider]  chlorhexidine (PERIDEX) 0.12 % solution Use as directed 15 mLs in the mouth or throat 2 (two) times daily. After breakfast and before bedtime 09/05/19  Yes [provider]  Cholecalciferol (VITAMIN D3) 50 MCG (2000 UT) capsule Take 2,000 Units by mouth daily.   Yes [provider]  Coenzyme Q10 100 MG TABS Take 100 mg by mouth 4 (four) times daily.    Yes [provider]  dextroamphetamine (DEXEDRINE SPANSULE) 15 MG 24 hr capsule Take 1 capsule by mouth 3 (three) times daily.  05/29/16  Yes [provider]  DHA-PHOSPHATIDYLSERINE PO Take 100 mg by mouth in the morning, at noon, and at bedtime.    Yes [provider]  ELIQUIS 5 MG TABS tablet Take 5 mg by mouth 2 (two) times daily. 07/28/19  Yes [provider]  gabapentin (NEURONTIN) 400 MG capsule Take 400 mg by  mouth 4 (four) times daily.   Yes [provider]  Ginkgo Biloba 60 MG TABS Take 60 mg by mouth 2 (two) times daily.    Yes [provider]  Glucosamine Sulfate 1000 MG CAPS Take 1,000 mg by mouth 2 (two) times daily.    Yes [provider]  guaiFENesin  (MUCINEX) 600 MG 12 hr tablet Take 600 mg by mouth 2 (two) times daily as needed.    Yes [provider]  metoprolol succinate (TOPROL-XL) 50 MG 24 hr tablet Take 50 mg by mouth daily. TAKE 1 TABLET BY MOUTH EVERY DAY 08/11/14  Yes [provider]  modafinil (PROVIGIL) 100 MG tablet Take 300 mg by mouth daily.  03/26/16  Yes [provider]  Multiple Vitamins-Minerals (MULTIVITAMIN & MINERAL PO) Take by mouth.   Yes [provider]  nitroGLYCERIN (NITROSTAT) 0.4 MG SL tablet Place 0.4 mg under the tongue every 5 (five) minutes as needed for chest pain.  10/09/13  Yes [provider]  PHOSPHATIDYLSERINE PO Take 60 mg by mouth 3 (three) times daily.   Yes [provider]  rosuvastatin (CRESTOR) 20 MG tablet Take 20 mg by mouth at bedtime.  10/02/14  Yes [provider]  tiotropium (SPIRIVA) 18 MCG inhalation capsule Place 1 capsule (18 mcg total) into inhaler and inhale daily. 06/28/16  Yes Gouru, Illene Silver, MD  torsemide (DEMADEX) 20 MG tablet Take 2 tablets (57m) by mouth once daily.   Yes [provider]   Allergies  Allergen Reactions  . Bee Pollen Shortness Of Breath    Post nasal drip  . Pollen Extract Shortness Of Breath    Post nasal drip Post nasal drip  . Codeine Other (See Comments)  . Other Other (See Comments)    pepper  . [place] Other (See Comments)  . Morphine Itching    (+) itching and redness noted to IV insertion site following administration. See ED nurses note from 11/25 at 0449. (+) itching and redness noted to IV insertion site following administration. See ED nurses note from 11/25 at 0449.   .Marland KitchenMorphine And Related Itching    (+) itching and redness noted to IV insertion site following administration. See ED nurses note from 11/25 at 0449.    FAMILY HISTORY:  family history includes Alzheimer's disease in his father; Asthma in his father; Dementia in his mother; Emphysema in his father. SOCIAL HISTORY:   reports that he has quit smoking. His smoking use included cigarettes. He has a 44.00 pack-year smoking history. He has never used smokeless tobacco. He reports that he does not drink alcohol or use drugs.  REVIEW OF SYSTEMS: Possible in BOLD  Constitutional: Negative for fever, chills, weight loss, malaise/fatigue and diaphoresis.  HENT: Negative for hearing loss, ear pain, nosebleeds, congestion, sore throat, neck pain, tinnitus and ear discharge.   Eyes: Negative for blurred vision, double vision, photophobia, pain, discharge and redness.  Respiratory: Negative for cough, hemoptysis, sputum production, shortness of breath, wheezing and stridor.   Cardiovascular: chest pain, palpitations, orthopnea, claudication, bilateral leg swelling (chronic BLE lymphedema) and PND.  Gastrointestinal: heartburn, nausea, vomiting, abdominal pain, diarrhea, constipation, blood in stool and melena.  Genitourinary: Negative for dysuria, urgency, frequency, hematuria and flank pain.  Musculoskeletal: Negative for myalgias, back pain, joint pain and falls.  Skin: abrasion to right lower extremity itching and rash.  Neurological: Negative for dizziness, tingling, tremors, sensory change, speech change, focal weakness, seizures, loss of consciousness, weakness and headaches.  Endo/Heme/Allergies:  Negative for environmental allergies and polydipsia. Does not bruise/bleed easily.  SUBJECTIVE:  No complaints at this time states " I'm hungry"  VITAL SIGNS: Temp:  [97.6 F (36.4 C)] 97.6 F (36.4 C) (04/22 1230) Pulse Rate:  [70-107] 94 (04/22 1400) Resp:  [11-20] 15 (04/22 1400) BP: (73-154)/(51-131) 79/54 (04/22 1400) SpO2:  [86 %-100 %] 97 % (04/22 1437)  PHYSICAL EXAMINATION: General: chronically ill appearing male, NAD resting in bed  Neuro: alert and oriented, follows commands HEENT: supple, no JVD  Cardiovascular: irregular irregular, no R/G  Lungs: clear throughout, even, non labored  Abdomen: +BS  x4, obese, 2+ abdominal swelling, non tender  Musculoskeletal: 3+ pitting bilateral lower extremity edema (chronic BLE lymphedema) Skin: right lower extremity abrasion with clear drainage and venous stasis color changes of BLE  Recent Labs  Lab 10/11/19 1249 10/12/19 0427 10/13/19 0521  NA 131* 134* 140  K 4.6 4.1 3.8  CL 93* 93* 100  CO2 27 30 32  BUN 47* 41* 31*  CREATININE 1.99* 1.73* 1.22  GLUCOSE 95 107* 108*   Recent Labs  Lab 10/11/19 1249 10/12/19 0624 10/13/19 0521  HGB 8.4* 7.7* 8.7*  HCT 28.4* 25.9* 28.8*  WBC 6.9 7.6 5.7  PLT 166 153 154   DG Chest 1 View  Result Date: 10/12/2019 CLINICAL DATA:  Shortness of breath EXAM: CHEST  1 VIEW COMPARISON:  10/11/2019 FINDINGS: Heart is normal size. Interstitial prominence again noted throughout the lungs compatible with chronic interstitial lung disease. This is similar to prior study. No effusions. No acute bony abnormality. IMPRESSION: Stable exam.  Chronic interstitial prominence. Electronically Signed   By: Rolm Baptise M.D.   On: 10/12/2019 01:35   US RENAL  Result Date: 10/12/2019 CLINICAL DATA:  Acute renal insufficiency EXAM: RENAL / URINARY TRACT ULTRASOUND COMPLETE COMPARISON:  None. FINDINGS: Right Kidney: Renal measurements: 9.9 x 5.1 by 5.2 cm = volume: 138 mL . Echogenicity within normal limits. No mass or hydronephrosis visualized. Left Kidney: Renal measurements: 11.4 x 6.0 by 5.1 cm = volume: 183 mL. Echotexture is normal. There is a nonobstructing 7 mm calculus within the upper pole. No hydronephrosis. Bladder: Bladder is moderately distended without filling defect. Other: Small right pleural effusion is noted.  There is trace ascites. IMPRESSION: 1. Nonobstructing 7 mm left renal calculus. 2. Otherwise unremarkable appearance of the kidneys. 3. Small right pleural effusion. 4. Trace ascites. Electronically Signed   By: Randa Ngo M.D.   On: 10/12/2019 17:31   ECHOCARDIOGRAM COMPLETE  Result Date:  10/13/2019    ECHOCARDIOGRAM REPORT   Patient Name:   KNUT RONDINELLI Date of Exam: 10/13/2019 Medical Rec #:  [id number]            Height:       74.0 in Accession #:    [id number]           Weight:       264.3 lb Date of Birth:  [birth year]             BSA:          2.447 m Patient Age:    34 years             BP:           93/57 mmHg Patient Gender: M                    HR:           99 bpm. Exam  Location:  [facility] Procedure: 2D Echo, Color Doppler and Cardiac Doppler Indications:     Dyspnea 786.09  History:         Patient has prior history of Echocardiogram examinations, most                  recent 05/18/2016. COPD; Risk Factors:Diabetes. Edema.  Sonographer:     Sherrie Sport RDCS (AE) Referring Phys:  [place] Diagnosing Phys: Isaias Cowman MD  Sonographer Comments: Image acquisition challenging due to COPD. IMPRESSIONS  1. Left ventricular ejection fraction, by estimation, is 60 to 65%. The left ventricle has normal function. The left ventricle has no regional wall motion abnormalities. There is mild left ventricular hypertrophy. Left ventricular diastolic parameters were normal.  2. Right ventricular systolic function is normal. The right ventricular size is moderately enlarged. There is severely elevated pulmonary artery systolic pressure.  3. Right atrial size was moderately dilated.  4. The mitral valve is normal in structure. Mild to moderate mitral valve regurgitation. No evidence of mitral stenosis.  5. Tricuspid valve regurgitation is moderate.  6. The aortic valve is normal in structure. Aortic valve regurgitation is not visualized. Moderate aortic valve stenosis.  7. The inferior vena cava is normal in size with greater than 50% respiratory variability, suggesting right atrial pressure of 3 mmHg. FINDINGS  Left Ventricle: Left ventricular ejection fraction, by estimation, is 60 to 65%. The left ventricle has normal function. The left ventricle has no regional wall motion abnormalities.  The left ventricular internal cavity size was normal in size. There is  mild left ventricular hypertrophy. Left ventricular diastolic parameters were normal. Right Ventricle: The right ventricular size is moderately enlarged. No increase in right ventricular wall thickness. Right ventricular systolic function is normal. There is severely elevated pulmonary artery systolic pressure. The tricuspid regurgitant velocity is 3.90 m/s, and with an assumed right atrial pressure of 10 mmHg, the estimated right ventricular systolic pressure is 17.7 mmHg. Left Atrium: Left atrial size was normal in size. Right Atrium: Right atrial size was moderately dilated. Pericardium: There is no evidence of pericardial effusion. Mitral Valve: The mitral valve is normal in structure. Normal mobility of the mitral valve leaflets. Mild to moderate mitral valve regurgitation. No evidence of mitral valve stenosis. Tricuspid Valve: The tricuspid valve is normal in structure. Tricuspid valve regurgitation is moderate . No evidence of tricuspid stenosis. Aortic Valve: The aortic valve is normal in structure. Aortic valve regurgitation is not visualized. Moderate aortic stenosis is present. Aortic valve mean gradient measures 20.3 mmHg. Aortic valve peak gradient measures 31.6 mmHg. Aortic valve area, by VTI measures 0.70 cm. Pulmonic Valve: The pulmonic valve was normal in structure. Pulmonic valve regurgitation is not visualized. No evidence of pulmonic stenosis. Aorta: The aortic root is normal in size and structure. Venous: The inferior vena cava is normal in size with greater than 50% respiratory variability, suggesting right atrial pressure of 3 mmHg. IAS/Shunts: No atrial level shunt detected by color flow Doppler.  LEFT VENTRICLE PLAX 2D LVIDd:         2.85 cm  Diastology LVIDs:         2.00 cm  LV e' lateral:   16.80 cm/s LV PW:         1.27 cm  LV E/e' lateral: 5.4 LV IVS:        1.58 cm  LV e' medial:    5.77 cm/s LVOT diam:     2.10  cm  LV  E/e' medial:  15.7 LV SV:         53 LV SV Index:   22 LVOT Area:     3.46 cm  RIGHT VENTRICLE RV Basal diam:  5.43 cm RV S prime:     5.66 cm/s TAPSE (M-mode): 2.7 cm LEFT ATRIUM           Index       RIGHT ATRIUM           Index LA diam:      5.10 cm 2.08 cm/m  RA Area:     25.70 cm LA Vol (A4C): 60.2 ml 24.60 ml/m RA Volume:   93.30 ml  38.13 ml/m  AORTIC VALVE                    PULMONIC VALVE AV Area (Vmax):    0.85 cm     PV Vmax:        0.66 m/s AV Area (Vmean):   0.75 cm     PV Peak grad:   1.8 mmHg AV Area (VTI):     0.70 cm     RVOT Peak grad: 2 mmHg AV Vmax:           281.00 cm/s AV Vmean:          211.667 cm/s AV VTI:            0.753 m AV Peak Grad:      31.6 mmHg AV Mean Grad:      20.3 mmHg LVOT Vmax:         68.70 cm/s LVOT Vmean:        45.700 cm/s LVOT VTI:          0.153 m LVOT/AV VTI ratio: 0.20  AORTA Ao Root diam: 2.80 cm MITRAL VALVE               TRICUSPID VALVE MV Area (PHT): 3.68 cm    TR Peak grad:   60.8 mmHg MV Decel Time: 206 msec    TR Vmax:        390.00 cm/s MV E velocity: 90.70 cm/s MV A velocity: 39.80 cm/s  SHUNTS MV E/A ratio:  2.28        Systemic VTI:  0.15 m                            Systemic Diam: 2.10 cm Isaias Cowman MD Electronically signed by Isaias Cowman MD Signature Date/Time: 10/13/2019/4:02:10 PM    Final    US Abdomen Limited RUQ  Result Date: 10/13/2019 CLINICAL DATA:  Patient with NASH. EXAM: ULTRASOUND ABDOMEN LIMITED RIGHT UPPER QUADRANT COMPARISON:  None. FINDINGS: Gallbladder: Decompressed. Mild wall thickening measuring up to 5 mm. No cholelithiasis. Common bile duct: Diameter: 4 mm Liver: Increased in echogenicity. Portal vein is patent on color Doppler imaging with normal direction of blood flow towards the liver. Other: Moderate volume ascites. IMPRESSION: Mild gallbladder wall thickening which is nonspecific in etiology. No cholelithiasis or additional signs to suggest acute cholecystitis. Moderate volume ascites. Hepatic  steatosis. Electronically Signed   By: Lovey Newcomer M.D.   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 septic shock  Diarrhea  Elevated troponin likely demand ischemia in the setting of acute illness Chronic lymphedema of BLE  Hx: CAD, Moderate Aortic Valve Stenosis, MI, Idiopathic Hypotension, HLD, Chronic Heart Failure with Preserved EF, and Atrial Fibrillation  Continuous telemetry monitoring  Will discontinue lasix gtt due to hypotension (no s/sx of respiratory failure and/or pulmonary edema) Cortisol level pending Will start scheduled midodrine and prn peripheral neo-synephrine gtt to maintain map >65 Continue outpatient eliquis  Echo pending  Cardiology consulted appreciate input   Acute renal failure-improving creatinine trending down  Hypoalbuminemia Trend BMP  Replace electrolytes as indicated  Monitor UOP Avoid nephrotoxic medications  IV albumin x1 dose   Possible RLE infected wound Trend WBC and monitor fever Trend PCT-if elevated will start abx Follow cultures   Critical care provider statement:    Critical care time (minutes):  33   Critical care time was exclusive of:  Separately billable procedures and  treating other patients   Critical care was necessary to treat or prevent imminent or  life-threatening deterioration of the following conditions:  acute hypoxemic respiratory failure, anasarca, multiple comorbid conditions   Critical care was time spent personally by me on the following  activities:  Development of treatment plan with patient or surrogate,  discussions with consultants, evaluation of patient's response to   treatment, examination of patient, obtaining history from patient or  surrogate, ordering and performing treatments and interventions, ordering  and review of laboratory studies and re-evaluation of patient's condition   I assumed direction of critical care for this patient from another  provider in my specialty: no      Ottie Glazier, M.D.  [facility]

## 2019-10-14 DIAGNOSIS — Z91030 Bee allergy status: Secondary | ICD-10-CM

## 2019-10-14 DIAGNOSIS — I878 Other specified disorders of veins: Secondary | ICD-10-CM

## 2019-10-14 DIAGNOSIS — G47419 Narcolepsy without cataplexy: Secondary | ICD-10-CM

## 2019-10-14 DIAGNOSIS — Z87891 Personal history of nicotine dependence: Secondary | ICD-10-CM

## 2019-10-14 DIAGNOSIS — I50810 Right heart failure, unspecified: Secondary | ICD-10-CM

## 2019-10-14 DIAGNOSIS — I2510 Atherosclerotic heart disease of native coronary artery without angina pectoris: Secondary | ICD-10-CM

## 2019-10-14 DIAGNOSIS — Z79899 Other long term (current) drug therapy: Secondary | ICD-10-CM

## 2019-10-14 DIAGNOSIS — R3916 Straining to void: Secondary | ICD-10-CM

## 2019-10-14 DIAGNOSIS — R0600 Dyspnea, unspecified: Secondary | ICD-10-CM

## 2019-10-14 DIAGNOSIS — M545 Low back pain: Secondary | ICD-10-CM

## 2019-10-14 DIAGNOSIS — Z955 Presence of coronary angioplasty implant and graft: Secondary | ICD-10-CM

## 2019-10-14 DIAGNOSIS — Z7901 Long term (current) use of anticoagulants: Secondary | ICD-10-CM

## 2019-10-14 DIAGNOSIS — Z885 Allergy status to narcotic agent status: Secondary | ICD-10-CM

## 2019-10-14 DIAGNOSIS — I4891 Unspecified atrial fibrillation: Secondary | ICD-10-CM

## 2019-10-14 DIAGNOSIS — I2729 Other secondary pulmonary hypertension: Principal | ICD-10-CM

## 2019-10-14 DIAGNOSIS — I959 Hypotension, unspecified: Secondary | ICD-10-CM

## 2019-10-14 DIAGNOSIS — N401 Enlarged prostate with lower urinary tract symptoms: Secondary | ICD-10-CM

## 2019-10-14 DIAGNOSIS — R601 Generalized edema: Secondary | ICD-10-CM

## 2019-10-14 DIAGNOSIS — Z91048 Other nonmedicinal substance allergy status: Secondary | ICD-10-CM

## 2019-10-14 DIAGNOSIS — J449 Chronic obstructive pulmonary disease, unspecified: Secondary | ICD-10-CM

## 2019-10-14 DIAGNOSIS — R600 Localized edema: Secondary | ICD-10-CM

## 2019-10-14 DIAGNOSIS — E119 Type 2 diabetes mellitus without complications: Secondary | ICD-10-CM

## 2019-10-14 DIAGNOSIS — D649 Anemia, unspecified: Secondary | ICD-10-CM

## 2019-10-14 LAB — CBC WITH DIFFERENTIAL/PLATELET
Abs Immature Granulocytes: 0.02 10*3/uL (ref 0.00–0.07)
Basophils Absolute: 0 10*3/uL (ref 0.0–0.1)
Basophils Relative: 1 %
Eosinophils Absolute: 0.2 10*3/uL (ref 0.0–0.5)
Eosinophils Relative: 3 %
HCT: 30.3 % — ABNORMAL LOW (ref 39.0–52.0)
Hemoglobin: 9 g/dL — ABNORMAL LOW (ref 13.0–17.0)
Immature Granulocytes: 0 %
Lymphocytes Relative: 23 %
Lymphs Abs: 1.2 10*3/uL (ref 0.7–4.0)
MCH: 25.5 pg — ABNORMAL LOW (ref 26.0–34.0)
MCHC: 29.7 g/dL — ABNORMAL LOW (ref 30.0–36.0)
MCV: 85.8 fL (ref 80.0–100.0)
Monocytes Absolute: 0.7 10*3/uL (ref 0.1–1.0)
Monocytes Relative: 14 %
Neutro Abs: 3.1 10*3/uL (ref 1.7–7.7)
Neutrophils Relative %: 59 %
Platelets: 167 10*3/uL (ref 150–400)
RBC: 3.53 MIL/uL — ABNORMAL LOW (ref 4.22–5.81)
RDW: 22.1 % — ABNORMAL HIGH (ref 11.5–15.5)
Smear Review: NORMAL
WBC: 5.3 10*3/uL (ref 4.0–10.5)
nRBC: 0 % (ref 0.0–0.2)

## 2019-10-14 LAB — BASIC METABOLIC PANEL
Anion gap: 7 (ref 5–15)
BUN: 27 mg/dL — ABNORMAL HIGH (ref 8–23)
CO2: 33 mmol/L — ABNORMAL HIGH (ref 22–32)
Calcium: 8.4 mg/dL — ABNORMAL LOW (ref 8.9–10.3)
Chloride: 97 mmol/L — ABNORMAL LOW (ref 98–111)
Creatinine, Ser: 1 mg/dL (ref 0.61–1.24)
GFR calc Af Amer: >60 mL/min (ref >60)
GFR calc non Af Amer: >60 mL/min (ref >60)
Glucose, Bld: 110 mg/dL — ABNORMAL HIGH (ref 70–99)
Potassium: 3.9 mmol/L (ref 3.5–5.1)
Sodium: 137 mmol/L (ref 135–145)

## 2019-10-14 LAB — PROCALCITONIN: Procalcitonin: 0.13 ng/mL

## 2019-10-14 LAB — MAGNESIUM: Magnesium: 2.2 mg/dL (ref 1.7–2.4)

## 2019-10-14 LAB — PHOSPHORUS: Phosphorus: 2.8 mg/dL (ref 2.5–4.6)

## 2019-10-14 MED ORDER — SODIUM CHLORIDE 0.9 % IV SOLN
3.0000 g | Freq: Four times a day (QID) | INTRAVENOUS | Status: DC
  Administered 2019-10-14 – 2019-10-15 (×3): 3 g via INTRAVENOUS
  Filled 2019-10-14: qty 8
  Filled 2019-10-14 (×2): qty 3
  Filled 2019-10-14 (×3): qty 8

## 2019-10-14 MED ORDER — LACTATED RINGERS IV BOLUS
1000.0000 mL | Freq: Once | INTRAVENOUS | Status: AC
  Administered 2019-10-14: 1000 mL via INTRAVENOUS

## 2019-10-14 MED ORDER — VANCOMYCIN HCL 2000 MG/400ML IV SOLN
2000.0000 mg | INTRAVENOUS | Status: DC
  Filled 2019-10-14: qty 400

## 2019-10-14 MED ORDER — LINEZOLID 600 MG PO TABS
600.0000 mg | ORAL_TABLET | Freq: Two times a day (BID) | ORAL | Status: DC
  Administered 2019-10-14: 15:00:00 600 mg via ORAL
  Filled 2019-10-14 (×2): qty 1

## 2019-10-14 NOTE — Consult Note (Signed)
NAME: Miguel Palmer  DOB: 1941/05/22  MRN: [id number]  Date/Time: 10/14/2019 12:32 PM  REQUESTING PROVIDER: Dr. Roosevelt Locks Subjective:  REASON FOR CONSULT: Leg wounds ? Miguel Palmer is a 79 y.o. male with a history of narcolepsy, COPD, diabetes mellitus, CAD with stent placement, A. fib admitted from his PCPs office on 10/11/2019 because of weakness and low blood pressure. As per patient and his wife he has had swelling of his legs for more than 2 months and unable to walk for 2 months.  The legs have been weeping.  He has been bumping his leg and causing wounds. In the PCPs office his vitals were blood pressure of 87/53, heart rate of 85, respiratory rate of 20 and temperature of 96. In the ED his temperature was 97.5, heart rate of 80, blood pressure 88/54. Labs showed a creatinine of 1.99 is baseline is 0.69, BNP of 1906, lactate was just 2.2, WBC was 8.4 and platelet of 166. Blood culture was sent.  Patient was admitted to the ICU and has been started on pressors.  He was a started on broad-spectrum antibiotic with vancomycin and meropenem.  I am asked to see the patient for the leg wounds. Patient is having trouble passing urine.  Says he has to strain a lot .  He has had this problem for a long time His wife says he has not been walking much in the last 2 months.  His legs have been swollen.  They increased his diuretics but that made him hypokalemic and very weak. He has chronic low back pain. As per his wife he has intermittent constipation for which he takes laxatives and that leads to diarrhea .  No stools documented since his admission. He had recently taken antibiotics for an infected dental cavity a month ago.  Past Medical History:  Diagnosis Date  . Arteriosclerosis    7 stents placed no s/s x 7 years  . Biceps tendinitis 05/29/2014  . COPD (chronic obstructive pulmonary disease) ([place])   . Diabetes mellitus without complication ([place])   . Disorder of rotator cuff  05/29/2014  . Edema    bilateral lower extremities  . Frank hematuria 04/28/2013  . Memory change 01/13/2014  . Narcolepsy   . Obesity   . 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 Oxygen deficiency     Past Surgical History:  Procedure Laterality Date  . CORONARY STENT PLACEMENT    . KNEE ARTHROSCOPY Right     Social History   Socioeconomic History  . Marital status: Married    Spouse name: Not on file  . Number of children: Not on file  . Years of education: Not on file  . Highest education level: Not on file  Occupational History  . Occupation: retired  Tobacco Use  . Smoking status: Former Smoker    Packs/day: 2.00    Years: 22.00    Pack years: 44.00    Types: Cigarettes  . Smokeless tobacco: Never Used  Substance and Sexual Activity  . Alcohol use: No    Alcohol/week: 0.0 standard drinks  . Drug use: No  . Sexual activity: Not on file  Other Topics Concern  . Not on file  Social History Narrative  . Not on file   Social Determinants of Health   Financial Resource Strain:   . Difficulty of Paying Living Expenses:   Food Insecurity:   . Worried About Charity fundraiser in the Last Year:   . [employer] of  Food in the Last Year:   Transportation Needs:   . Film/video editor (Medical):   Marland Kitchen Lack of Transportation (Non-Medical):   Physical Activity:   . Days of Exercise per Week:   . Minutes of Exercise per Session:   Stress:   . Feeling of Stress :   Social Connections:   . Frequency of Communication with Friends and Family:   . Frequency of Social Gatherings with Friends and Family:   . Attends Religious Services:   . Active Member of Clubs or Organizations:   . Attends Archivist Meetings:   Marland Kitchen Marital Status:   Intimate Partner Violence:   . Fear of Current or Ex-Partner:   . Emotionally Abused:   Marland Kitchen Physically Abused:   . Sexually Abused:     Family History  Problem Relation Age of Onset  . Dementia Mother   . Asthma Father   .  Emphysema Father   . Alzheimer's disease Father    Allergies  Allergen Reactions  . Bee Pollen Shortness Of Breath    Post nasal drip  . Pollen Extract Shortness Of Breath    Post nasal drip Post nasal drip  . Codeine Other (See Comments)  . Other Other (See Comments)    Pepper Per Mr. Mimbs and Wife patient is not allergic to pepper. Removed per patient and pharmacy.  Elzie Rings Other (See Comments)  . Morphine Itching    (+) itching and redness noted to IV insertion site following administration. See ED nurses note from 11/25 at 0449. (+) itching and redness noted to IV insertion site following administration. See ED nurses note from 11/25 at 0449.   Marland Kitchen Morphine And Related Itching    (+) itching and redness noted to IV insertion site following administration. See ED nurses note from 11/25 at 0449.   ? Current Facility-Administered Medications  Medication Dose Route Frequency Provider Last Rate Last Admin  . 0.9 %  sodium chloride infusion  250 mL Intravenous PRN Max Sane, MD      . acetaminophen (TYLENOL) tablet 650 mg  650 mg Oral Q4H PRN Max Sane, MD   650 mg at 10/13/19 0532  . albumin human 25 % solution 12.5 g  12.5 g Intravenous Daily Ottie Glazier, MD   Stopped at 10/13/19 1405  . apixaban (ELIQUIS) tablet 5 mg  5 mg Oral BID Max Sane, MD   5 mg at 10/14/19 2409  . calcium carbonate (TUMS - dosed in mg elemental calcium) chewable tablet 200 mg of elemental calcium  1 tablet Oral BID PRN Awilda Bill, NP   200 mg of elemental calcium at 10/13/19 0554  . Chlorhexidine Gluconate Cloth 2 % PADS 6 each  6 each Topical Q0600 Lang Snow, NP      . collagenase (SANTYL) ointment 1 application  1 application Topical Daily Awilda Bill, NP   1 application at 73/53/29 0825  . dextroamphetamine (DEXEDRINE SPANSULE) 24 hr capsule 15 mg  15 mg Oral TID Sharen Hones, MD   15 mg at 10/14/19 9242  . feeding supplement (ENSURE ENLIVE) (ENSURE ENLIVE) liquid 237 mL   237 mL Oral BID BM Sharen Hones, MD   237 mL at 10/14/19 0821  . feeding supplement (PRO-STAT SUGAR FREE 64) liquid 30 mL  30 mL Oral TID Sharen Hones, MD   30 mL at 10/14/19 0823  . fludrocortisone (FLORINEF) tablet 0.1 mg  0.1 mg Oral BID Ottie Glazier, MD   0.1  mg at 10/14/19 0823  . iron polysaccharides (NIFEREX) capsule 150 mg  150 mg Oral Daily Sharen Hones, MD   150 mg at 10/14/19 0824  . linezolid (ZYVOX) tablet 600 mg  600 mg Oral Q12H Sharen Hones, MD      . meropenem [facility]) 1 g in sodium chloride 0.9 % 100 mL IVPB  1 g Intravenous Q8H Ottie Glazier, MD   Stopped at 10/14/19 0556  . midodrine (PROAMATINE) tablet 10 mg  10 mg Oral TID WC Ottie Glazier, MD   10 mg at 10/14/19 0821  . modafinil (PROVIGIL) tablet 300 mg  300 mg Oral BID Awilda Bill, NP   300 mg at 10/14/19 0526  . multivitamin with minerals tablet 1 tablet  1 tablet Oral Daily Sharen Hones, MD   1 tablet at 10/14/19 [phone]  . ondansetron (ZOFRAN) injection 4 mg  4 mg Intravenous Q6H PRN Max Sane, MD   4 mg at 10/13/19 0156  . phenylephrine (NEO-SYNEPHRINE) 10 mg in sodium chloride 0.9 % 250 mL (0.04 mg/mL) infusion  20-100 mcg/min Intravenous Titrated Lang Snow, NP 52.5 mL/hr at 10/14/19 0800 35 mcg/min at 10/14/19 0800  . sodium chloride flush (NS) 0.9 % injection 3 mL  3 mL Intravenous Q12H Max Sane, MD   3 mL at 10/14/19 0824  . sodium chloride flush (NS) 0.9 % injection 3 mL  3 mL Intravenous PRN Max Sane, MD      . tiotropium (SPIRIVA) inhalation capsule ([facility] use ONLY) 18 mcg  18 mcg Inhalation QHS Awilda Bill, NP   18 mcg at 10/13/19 2123  . torsemide (DEMADEX) tablet 20 mg  20 mg Oral Daily Ottie Glazier, MD   20 mg at 10/13/19 1116     Abtx:  Anti-infectives (From admission, onward)   Start     Dose/Rate Route Frequency Ordered Stop   10/14/19 1100  vancomycin (VANCOREADY) IVPB 2000 mg/400 mL  Status:  Discontinued     2,000 mg 200 mL/hr over 120 Minutes Intravenous Every  24 hours 10/14/19 0850 10/14/19 0951   10/14/19 1100  linezolid (ZYVOX) tablet 600 mg     600 mg Oral Every 12 hours 10/14/19 0954     10/13/19 2300  vancomycin (VANCOREADY) IVPB 1750 mg/350 mL  Status:  Discontinued     1,750 mg 175 mL/hr over 120 Minutes Intravenous Every 36 hours 10/12/19 1157 10/13/19 0825   10/13/19 1530  meropenem (MERREM) 1 g in sodium chloride 0.9 % 100 mL IVPB     1 g 200 mL/hr over 30 Minutes Intravenous Every 8 hours 10/13/19 1443     10/13/19 1100  vancomycin (VANCOREADY) IVPB 1500 mg/300 mL  Status:  Discontinued     1,500 mg 150 mL/hr over 120 Minutes Intravenous Every 24 hours 10/13/19 0825 10/14/19 0850   10/12/19 1000  vancomycin (VANCOREADY) IVPB 2000 mg/400 mL     2,000 mg 200 mL/hr over 120 Minutes Intravenous  Once 10/12/19 0903 10/12/19 1250      REVIEW OF SYSTEMS:  Const: negative fever, negative chills, negative weight loss Eyes: negative diplopia or visual changes, negative eye pain ENT: negative coryza, negative sore throat Resp: Has dyspnea Cards: Has severe edema legs GU: Has difficulty in initiating micturition.  No dysuria or hematuria GI: Negative for abdominal pain, diarrhea, bleeding, constipation Skin: Discoloration of the legs Heme: negative for easy bruising and gum/nose bleeding MS: Generalized weakness Low back pain.  Has received steroid injections before Neurolo: Has narcolepsy  and can fall asleep easily  Endocrine: Has thyroid, diabetes Allergy/Immunology-allergic to codeine, morphine pollen extract  Objective:  VITALS:  BP 95/64   Pulse 95   Temp 97.6 F (36.4 C) (Oral)   Resp 13   Ht 6' 2"  (1.88 m)   Wt 123.4 kg   SpO2 95%   BMI 34.93 kg/m  PHYSICAL EXAM:  General: Alert, cooperative, no distress, appears stated age.  Hard of hearing Head: Normocephalic, without obvious abnormality, atraumatic. Eyes: Conjunctivae clear, anicteric sclerae. Pupils are equal ENT Nares normal. No drainage or sinus tenderness.  Lips, mucosa, and tongue normal. No Thrush Neck: Supple, symmetrical, no adenopathy, thyroid: non tender no carotid bruit and no JVD. Back: No CVA tenderness. Lungs: Bilateral air entry.  Crypts bases Heart: Irregular Abdomen: Soft, non-tender,not distended. Bowel sounds normal. No masses Extremities: Edema legs Venous discoloration of the legs Superficial skin tears of the right leg at the shin area with extreme edema there is oozing from the tears.  Does not look overtly infected        Lymph: Cervical, supraclavicular normal. Neurologic: Grossly non-focal Pertinent Labs Lab Results CBC    Component Value Date/Time   WBC 5.3 10/14/2019 0511   RBC 3.53 (L) 10/14/2019 0511   HGB 9.0 (L) 10/14/2019 0511   HCT 30.3 (L) 10/14/2019 0511   PLT 167 10/14/2019 0511   MCV 85.8 10/14/2019 0511   MCH 25.5 (L) 10/14/2019 0511   MCHC 29.7 (L) 10/14/2019 0511   RDW 22.1 (H) 10/14/2019 0511   LYMPHSABS 1.2 10/14/2019 0511   MONOABS 0.7 10/14/2019 0511   EOSABS 0.2 10/14/2019 0511   BASOSABS 0.0 10/14/2019 0511    CMP Latest Ref Rng & Units 10/14/2019 10/13/2019 10/12/2019  Glucose 70 - 99 mg/dL 110(H) 108(H) 107(H)  BUN 8 - 23 mg/dL 27(H) 31(H) 41(H)  Creatinine 0.61 - 1.24 mg/dL 1.00 1.22 1.73(H)  Sodium 135 - 145 mmol/L 137 140 134(L)  Potassium 3.5 - 5.1 mmol/L 3.9 3.8 4.1  Chloride 98 - 111 mmol/L 97(L) 100 93(L)  CO2 22 - 32 mmol/L 33(H) 32 30  Calcium 8.9 - 10.3 mg/dL 8.4(L) 8.6(L) 8.5(L)  Total Protein 6.5 - 8.1 g/dL - - -  Total Bilirubin 0.3 - 1.2 mg/dL - - -  Alkaline Phos 38 - 126 U/L - - -  AST 15 - 41 U/L - - -  ALT 0 - 44 U/L - - -      Microbiology: Recent Results (from the past 240 hour(s))  Blood culture (single)     Status: None (Preliminary result)   Collection Time: 10/11/19 12:49 PM   Specimen: BLOOD  Result Value Ref Range Status   Specimen Description BLOOD BLOOD RIGHT WRIST  Final   Special Requests   Final    BOTTLES DRAWN AEROBIC AND  ANAEROBIC Blood Culture adequate volume   Culture   Final    NO GROWTH 3 DAYS Performed at [facility], [address] [place], [place] [address]    Report Status PENDING  Incomplete  Blood culture (single)     Status: None (Preliminary result)   Collection Time: 10/11/19  1:01 PM   Specimen: BLOOD  Result Value Ref Range Status   Specimen Description BLOOD RIGHT ANTECUBITAL  Final   Special Requests   Final    BOTTLES DRAWN AEROBIC AND ANAEROBIC Blood Culture results may not be optimal due to an excessive volume of blood received in culture bottles   Culture   Final  NO GROWTH 3 DAYS Performed at [facility], [address] [place], [place] [address]    Report Status PENDING  Incomplete  Respiratory Panel by RT PCR (Flu A&B, Covid) - Nasopharyngeal Swab     Status: None   Collection Time: 10/11/19  5:06 PM   Specimen: Nasopharyngeal Swab  Result Value Ref Range Status   SARS Coronavirus 2 by RT PCR NEGATIVE NEGATIVE Final    Comment: (NOTE) SARS-CoV-2 target nucleic acids are NOT DETECTED. The SARS-CoV-2 RNA is generally detectable in upper respiratoy specimens during the acute phase of infection. The lowest concentration of SARS-CoV-2 viral copies this assay can detect is 131 copies/mL. A negative result does not preclude SARS-Cov-2 infection and should not be used as the sole basis for treatment or other patient management decisions. A negative result may occur with  improper specimen collection/handling, submission of specimen other than nasopharyngeal swab, presence of viral mutation(s) within the areas targeted by this assay, and inadequate number of viral copies (<131 copies/mL). A negative result must be combined with clinical observations, patient history, and epidemiological information. The expected result is Negative. Fact Sheet for Patients:  PinkCheek.be Fact Sheet for Healthcare Providers:   GravelBags.it This test is not yet ap proved or cleared by the [place] FDA and  has been authorized for detection and/or diagnosis of SARS-CoV-2 by FDA under an Emergency Use Authorization (EUA). This EUA will remain  in effect (meaning this test can be used) for the duration of the COVID-19 declaration under Section 564(b)(1) of the Act, 21 U.S.C. section 360bbb-3(b)(1), unless the authorization is terminated or revoked sooner.    Influenza A by PCR NEGATIVE NEGATIVE Final   Influenza B by PCR NEGATIVE NEGATIVE Final    Comment: (NOTE) The Xpert Xpress SARS-CoV-2/FLU/RSV assay is intended as an aid in  the diagnosis of influenza from Nasopharyngeal swab specimens and  should not be used as a sole basis for treatment. Nasal washings and  aspirates are unacceptable for Xpert Xpress SARS-CoV-2/FLU/RSV  testing. Fact Sheet for Patients: PinkCheek.be Fact Sheet for Healthcare Providers: GravelBags.it This test is not yet approved or cleared by the [place] FDA and  has been authorized for detection and/or diagnosis of SARS-CoV-2 by  FDA under an Emergency Use Authorization (EUA). This EUA will remain  in effect (meaning this test can be used) for the duration of the  Covid-19 declaration under Section 564(b)(1) of the Act, 21  U.S.C. section 360bbb-3(b)(1), unless the authorization is  terminated or revoked. Performed at [facility], [address] [place], [place] [address]   MRSA PCR Screening     Status: None   Collection Time: 10/12/19  3:49 AM   Specimen: Nasopharyngeal  Result Value Ref Range Status   MRSA by PCR NEGATIVE NEGATIVE Final    Comment:        The GeneXpert MRSA Assay (FDA approved for NASAL specimens only), is one component of a comprehensive MRSA colonization surveillance program. It is not intended to diagnose MRSA infection nor to guide or monitor  treatment for MRSA infections. Performed at [facility], [address] [place], [place] [address]   Aerobic/Anaerobic Culture (surgical/deep wound)     Status: None (Preliminary result)   Collection Time: 10/12/19  4:46 AM   Specimen: Leg; Wound  Result Value Ref Range Status   Specimen Description   Final    LEG RIGHT Performed at [facility], [address] [place], [place] [address]  Special Requests   Final    NONE Performed at [facility], [place], [place] [address]    Gram Stain NO WBC SEEN FEW GRAM POSITIVE COCCI IN PAIRS   Final   Culture   Final    ABUNDANT ACINETOBACTER SPECIES FEW CITROBACTER FREUNDII CULTURE REINCUBATED FOR BETTER GROWTH Performed at [facility], [place] [address] [place], [place] [address]    Report Status PENDING  Incomplete   Organism ID, Bacteria ACINETOBACTER SPECIES  Final      Susceptibility   Acinetobacter species - MIC*    CEFTAZIDIME 4 SENSITIVE Sensitive     CEFTRIAXONE 8 SENSITIVE Sensitive     CIPROFLOXACIN <=0.25 SENSITIVE Sensitive     GENTAMICIN <=1 SENSITIVE Sensitive     IMIPENEM <=0.25 SENSITIVE Sensitive     PIP/TAZO <=4 SENSITIVE Sensitive     TRIMETH/SULFA <=20 SENSITIVE Sensitive     CEFEPIME 2 SENSITIVE Sensitive     AMPICILLIN/SULBACTAM <=2 SENSITIVE Sensitive     * ABUNDANT ACINETOBACTER SPECIES    IMAGING RESULTS:   I have personally reviewed the films ?Heart is normal size. Interstitial prominence again noted throughout the lungs compatible with chronic interstitial lung disease.     Echo The right ventricular  size is moderately enlarged. There is severely elevated pulmonary artery  systolic pressure.  3. Right atrial size was moderately dilated.     Impression/Recommendation ? ?Hypotension: Cardiogenic etiology versus sepsis etiology was the differential diagnosis. On clinical examination there is no evidence of sepsis.  The wounds on his  legs do not look infected.  He does not have pneumonia.  He does not have urinary tract infection .even though he gives a history of diarrhea and no stools have been documented. Has moderate aortic stenosis Cortisol normal Is currently on midodrine and low-dose phenylephrine  Chronic atrial fibrillation on Eliquis  Venous edema with stasis with superficial wounds from trauma which does not look infected.  There is excessive fluid in the legs. He is currently on vancomycin and meropenem and both can be stopped. He has Acinetobacter and Citrobacter in the wound which could be just colonizing it.  We will do Unasyn for a couple of days along with compression bandages to see how the leg swelling  Severe pulmonary hypertension and right-sided  AKI has resolved  Anemia  Low albumin  _Narcolepsy on dextroamphetamine 15 mg 3 times a day and modafinil 366m BID ( as per his PCP the dose is 1031mTID) Proimary team/pharmacy to clarify and adjust dose. Can the above drug be contributing to his condition ( though hypotension cannot be due to dextroamphetamine) __________________________________________________ Discussed with patient,and his wife and care team D will follow him peripherally this weekend Note:  This document was prepared using Dragon voice recognition software and may include unintentional dictation errors.

## 2019-10-14 NOTE — Progress Notes (Signed)
Pt is still trying to void at bedside commode and refuses bladder scan or catheter at this time. He was agitated and sarcastic when nurse came in to check on him, and wished to be alone. Will come back to check on him.

## 2019-10-14 NOTE — Progress Notes (Signed)
PROGRESS NOTE    Miguel Palmer  OHY:[id number] DOB: December 11, 1940 DOA: 10/11/2019 PCP: [name], MD (Confirm with patient/family/NH records and if not entered, this HAS to be entered at [facility] point of entry. "No PCP" if truly none.)   Brief Narrative: (Start on day 1 of progress note - keep it brief and live) Patient is a 79 year old male with history of type 2 diabetes, COPD, coronary artery disease, obesity, who is admitted to the hospital with anasarca and hypotension.  Chronically, patient was on night oxygen, and a as needed day oxygen. He has baseline short of breath with exertion. He walks with a walker, on a walk about 3 steps. He had infrequent paroxysmal active dyspnea, he has no orthopnea. He has a chronic leg edema, seems to be chronic in nature not getting worse. He denies any weight gain. Patient also states that he has chronic constipation, he took some laxative about a week ago. Since then, he has been having loose stools for the past week. 2-3loose stools a day, no rectal bleeding. In the emergency room, he was a found to have hypotension. He also had a significant leg edema. Chest x-ray has chronic changes without acute pulmonary edema. Procalcitonin 0.44, BNP 1906.   Assessment & Plan:   Active Problems:   CHF (congestive heart failure) (HCC)   Hypotension   Goals of care, counseling/discussion   DNR (do not resuscitate)   Malnutrition of moderate degree   Palliative care by specialist  1.  Hypotension.  Patient is still requiring phenylephrine to maintain blood pressure.  I have reviewed patient echocardiogram, ejection fraction 65%, moderate aortic stenosis and a moderate mitral regurgitation.  Did not see any pulmonary hypertension.  I have discussed with Dr. Saralyn Pilar, he does not believe that hypotension was due to poor heart function.  Another possibility is sepsis, patient has been treated with the vancomycin and meropenem for lower extremity  infection.  I will continue antibiotics for today.  I will switch vancomycin to Zyvox due to shortage from the pharmacy.  Consult infectious disease.  2.  Anasarca. Discussed with cardiology, unlikely due to cardiac origin.  Patient appears to be edematous below the waist line, possibility of liver cirrhosis and portal hypertension.  Will obtain liver ultrasound to confirm.  Patient is seen by nephrology to manage diuretics.  3.  Iron deficient anemia. B12 level is normal.  Continue iron supplement orally.  4.  Right lower extremity ulceration with cellulitis. I was able to thoroughly examine his right leg, the skin looks red, but largely chronic.  But could not rule out acute cellulitis.  He initially had a higher procalcitonin level, has dropped down to 0.13.  Treatment is effective.  Continue antibiotics as above.  5.  Permanent atrial fibrillation. Continue anticoagulation.  6.  Type 2 diabetes.  Continue current regimen.  7.  Acute kidney injury. Renal function has normalized.  DVT prophylaxis: Eliquis Code Status: DNR Family Communication: Discussed with patient and the wife, all questions answered.  . Patient came from: Home            . Anticipated d/c place: Patient and his wife prefer discharge to home. . Barriers to d/c OR conditions which need to be met to effect a safe d/c:   Consultants:   ID,  Pulmonology  Nephrology  Cardiology  Procedures: None Antimicrobials: Zyvox and meropenem  Subjective: Patient is still on pressor, blood pressure has been maintained.  He does not feel any short of  breath.  No dizziness.  He has a good appetite, no nausea vomiting.  No fever or chills.  Objective: Vitals:   10/14/19 0352 10/14/19 0400 10/14/19 0800 10/14/19 0815  BP:   (!) 80/60   Pulse:   87   Resp:   16   Temp:  98.2 F (36.8 C) 97.6 F (36.4 C)   TempSrc:  Oral Oral   SpO2:    95%  Weight: 123.4 kg     Height:        Intake/Output Summary (Last 24  hours) at 10/14/2019 0940 Last data filed at 10/14/2019 0800 Gross per 24 hour  Intake 2311.79 ml  Output 325 ml  Net 1986.79 ml   Filed Weights   10/11/19 2101 10/12/19 0350 10/14/19 0352  Weight: 120.3 kg 119.9 kg 123.4 kg    Examination:  General exam: Appears calm and comfortable  Respiratory system: Clear to auscultation. Respiratory effort normal. Cardiovascular system: Irregular and tachycardic. No JVD, murmurs, rubs, gallops or clicks.  Severe anasarca. Gastrointestinal system: Abdomen is distended, soft and nontender. No organomegaly or masses felt. Normal bowel sounds heard. Central nervous system: Alert and oriented x2.  No focal neurological deficits. Extremities: Symmetric . Skin: Right leg ulcer, still red, slightly warm. Psychiatry: Judgement and insight appear normal. Mood & affect appropriate.     Data Reviewed: I have personally reviewed following labs and imaging studies  CBC: Recent Labs  Lab 10/11/19 1249 10/12/19 0624 10/13/19 0521 10/14/19 0511  WBC 6.9 7.6 5.7 5.3  NEUTROABS 5.2 5.8 3.5 3.1  HGB 8.4* 7.7* 8.7* 9.0*  HCT 28.4* 25.9* 28.8* 30.3*  MCV 85.8 85.5 84.0 85.8  PLT 166 153 154 983   Basic Metabolic Panel: Recent Labs  Lab 10/11/19 1249 10/12/19 0427 10/12/19 0624 10/13/19 0521 10/14/19 0511  NA 131* 134*  --  140 137  K 4.6 4.1  --  3.8 3.9  CL 93* 93*  --  100 97*  CO2 27 30  --  32 33*  GLUCOSE 95 107*  --  108* 110*  BUN 47* 41*  --  31* 27*  CREATININE 1.99* 1.73*  --  1.22 1.00  CALCIUM 8.6* 8.5*  --  8.6* 8.4*  MG  --   --  2.5* 2.3 2.2  PHOS  --   --  3.5 3.2 2.8   GFR: Estimated Creatinine Clearance: 83.6 mL/min (by C-G formula based on SCr of 1 mg/dL). Liver Function Tests: Recent Labs  Lab 10/11/19 1249 10/11/19 1507  AST 40  --   ALT 25  --   ALKPHOS 101  --   BILITOT 2.4*  --   PROT 5.8*  --   ALBUMIN 2.8* 2.5*   Recent Labs  Lab 10/11/19 1249  LIPASE 30   No results for input(s): AMMONIA in the  last 168 hours. Coagulation Profile: Recent Labs  Lab 10/11/19 1249  INR 1.7*   Cardiac Enzymes: No results for input(s): CKTOTAL, CKMB, CKMBINDEX, TROPONINI in the last 168 hours. BNP (last 3 results) No results for input(s): PROBNP in the last 8760 hours. HbA1C: No results for input(s): HGBA1C in the last 72 hours. CBG: Recent Labs  Lab 10/12/19 0345  GLUCAP 121*   Lipid Profile: No results for input(s): CHOL, HDL, LDLCALC, TRIG, CHOLHDL, LDLDIRECT in the last 72 hours. Thyroid Function Tests: No results for input(s): TSH, T4TOTAL, FREET4, T3FREE, THYROIDAB in the last 72 hours. Anemia Panel: Recent Labs    10/13/19 0521  [id number]  629  TIBC 265  IRON 20*   Sepsis Labs: Recent Labs  Lab 10/11/19 1249 10/11/19 1507 10/12/19 0624 10/13/19 0521 10/14/19 0511  PROCALCITON  --   --  0.44 0.21 0.13  LATICACIDVEN 2.2* 1.6 1.9  --   --     Recent Results (from the past 240 hour(s))  Blood culture (single)     Status: None (Preliminary result)   Collection Time: 10/11/19 12:49 PM   Specimen: BLOOD  Result Value Ref Range Status   Specimen Description BLOOD BLOOD RIGHT WRIST  Final   Special Requests   Final    BOTTLES DRAWN AEROBIC AND ANAEROBIC Blood Culture adequate volume   Culture   Final    NO GROWTH 3 DAYS Performed at [facility], [address], [place], [place] [address]    Report Status PENDING  Incomplete  Blood culture (single)     Status: None (Preliminary result)   Collection Time: 10/11/19  1:01 PM   Specimen: BLOOD  Result Value Ref Range Status   Specimen Description BLOOD RIGHT ANTECUBITAL  Final   Special Requests   Final    BOTTLES DRAWN AEROBIC AND ANAEROBIC Blood Culture results may not be optimal due to an excessive volume of blood received in culture bottles   Culture   Final    NO GROWTH 3 DAYS Performed at [facility], [address] [place],  [address]    Report Status PENDING  Incomplete    Respiratory Panel by RT PCR (Flu A&B, Covid) - Nasopharyngeal Swab     Status: None   Collection Time: 10/11/19  5:06 PM   Specimen: Nasopharyngeal Swab  Result Value Ref Range Status   SARS Coronavirus 2 by RT PCR NEGATIVE NEGATIVE Final    Comment: (NOTE) SARS-CoV-2 target nucleic acids are NOT DETECTED. The SARS-CoV-2 RNA is generally detectable in upper respiratoy specimens during the acute phase of infection. The lowest concentration of SARS-CoV-2 viral copies this assay can detect is 131 copies/mL. A negative result does not preclude SARS-Cov-2 infection and should not be used as the sole basis for treatment or other patient management decisions. A negative result may occur with  improper specimen collection/handling, submission of specimen other than nasopharyngeal swab, presence of viral mutation(s) within the areas targeted by this assay, and inadequate number of viral copies (<131 copies/mL). A negative result must be combined with clinical observations, patient history, and epidemiological information. The expected result is Negative. Fact Sheet for Patients:  PinkCheek.be Fact Sheet for Healthcare Providers:  GravelBags.it This test is not yet ap proved or cleared by the [place] FDA and  has been authorized for detection and/or diagnosis of SARS-CoV-2 by FDA under an Emergency Use Authorization (EUA). This EUA will remain  in effect (meaning this test can be used) for the duration of the COVID-19 declaration under Section 564(b)(1) of the Act, 21 U.S.C. section 360bbb-3(b)(1), unless the authorization is terminated or revoked sooner.    Influenza A by PCR NEGATIVE NEGATIVE Final   Influenza B by PCR NEGATIVE NEGATIVE Final    Comment: (NOTE) The Xpert Xpress SARS-CoV-2/FLU/RSV assay is intended as an aid in  the diagnosis of influenza from Nasopharyngeal swab specimens and  should not be used as a sole  basis for treatment. Nasal washings and  aspirates are unacceptable for Xpert Xpress SARS-CoV-2/FLU/RSV  testing. Fact Sheet for Patients: PinkCheek.be Fact Sheet for Healthcare Providers: GravelBags.it This test is not yet approved or cleared by the [place]  States FDA and  has been authorized for detection and/or diagnosis of SARS-CoV-2 by  FDA under an Emergency Use Authorization (EUA). This EUA will remain  in effect (meaning this test can be used) for the duration of the  Covid-19 declaration under Section 564(b)(1) of the Act, 21  U.S.C. section 360bbb-3(b)(1), unless the authorization is  terminated or revoked. Performed at [facility], [address] [place], [place] [address]   MRSA PCR Screening     Status: None   Collection Time: 10/12/19  3:49 AM   Specimen: Nasopharyngeal  Result Value Ref Range Status   MRSA by PCR NEGATIVE NEGATIVE Final    Comment:        The GeneXpert MRSA Assay (FDA approved for NASAL specimens only), is one component of a comprehensive MRSA colonization surveillance program. It is not intended to diagnose MRSA infection nor to guide or monitor treatment for MRSA infections. Performed at [facility], [address] [place], [place] [address]   Aerobic/Anaerobic Culture (surgical/deep wound)     Status: None (Preliminary result)   Collection Time: 10/12/19  4:46 AM   Specimen: Leg; Wound  Result Value Ref Range Status   Specimen Description   Final    LEG RIGHT Performed at [facility], [address], [place], [place] [address]    Special Requests   Final    NONE Performed at [facility], [address] [place], [place] [address]    Gram Stain   Final    NO WBC SEEN FEW GRAM POSITIVE COCCI IN PAIRS Performed at [facility], [place] [address] [place], [place] [address]    Culture ABUNDANT ACINETOBACTER SPECIES  Final   Report  Status PENDING  Incomplete         Radiology Studies: US RENAL  Result Date: 10/12/2019 CLINICAL DATA:  Acute renal insufficiency EXAM: RENAL / URINARY TRACT ULTRASOUND COMPLETE COMPARISON:  None. FINDINGS: Right Kidney: Renal measurements: 9.9 x 5.1 by 5.2 cm = volume: 138 mL . Echogenicity within normal limits. No mass or hydronephrosis visualized. Left Kidney: Renal measurements: 11.4 x 6.0 by 5.1 cm = volume: 183 mL. Echotexture is normal. There is a nonobstructing 7 mm calculus within the upper pole. No hydronephrosis. Bladder: Bladder is moderately distended without filling defect. Other: Small right pleural effusion is noted.  There is trace ascites. IMPRESSION: 1. Nonobstructing 7 mm left renal calculus. 2. Otherwise unremarkable appearance of the kidneys. 3. Small right pleural effusion. 4. Trace ascites. Electronically Signed   By: Randa Ngo M.D.   On: 10/12/2019 17:31   ECHOCARDIOGRAM COMPLETE  Result Date: 10/13/2019    ECHOCARDIOGRAM REPORT   Patient Name:   ARNE SCHLENDER Date of Exam: 10/13/2019 Medical Rec #:  [id number]            Height:       74.0 in Accession #:    [id number]           Weight:       264.3 lb Date of Birth:  [birth year]             BSA:          2.447 m Patient Age:    73 years             BP:           93/57 mmHg Patient Gender: M  HR:           99 bpm. Exam Location:  [facility] Procedure: 2D Echo, Color Doppler and Cardiac Doppler Indications:     Dyspnea 786.09  History:         Patient has prior history of Echocardiogram examinations, most                  recent 05/18/2016. COPD; Risk Factors:Diabetes. Edema.  Sonographer:     Sherrie Sport RDCS (AE) Referring Phys:  [place] Diagnosing Phys: Isaias Cowman MD  Sonographer Comments: Image acquisition challenging due to COPD. IMPRESSIONS  1. Left ventricular ejection fraction, by estimation, is 60 to 65%. The left ventricle has normal function. The left ventricle has no regional  wall motion abnormalities. There is mild left ventricular hypertrophy. Left ventricular diastolic parameters were normal.  2. Right ventricular systolic function is normal. The right ventricular size is moderately enlarged. There is severely elevated pulmonary artery systolic pressure.  3. Right atrial size was moderately dilated.  4. The mitral valve is normal in structure. Mild to moderate mitral valve regurgitation. No evidence of mitral stenosis.  5. Tricuspid valve regurgitation is moderate.  6. The aortic valve is normal in structure. Aortic valve regurgitation is not visualized. Moderate aortic valve stenosis.  7. The inferior vena cava is normal in size with greater than 50% respiratory variability, suggesting right atrial pressure of 3 mmHg. FINDINGS  Left Ventricle: Left ventricular ejection fraction, by estimation, is 60 to 65%. The left ventricle has normal function. The left ventricle has no regional wall motion abnormalities. The left ventricular internal cavity size was normal in size. There is  mild left ventricular hypertrophy. Left ventricular diastolic parameters were normal. Right Ventricle: The right ventricular size is moderately enlarged. No increase in right ventricular wall thickness. Right ventricular systolic function is normal. There is severely elevated pulmonary artery systolic pressure. The tricuspid regurgitant velocity is 3.90 m/s, and with an assumed right atrial pressure of 10 mmHg, the estimated right ventricular systolic pressure is 67.6 mmHg. Left Atrium: Left atrial size was normal in size. Right Atrium: Right atrial size was moderately dilated. Pericardium: There is no evidence of pericardial effusion. Mitral Valve: The mitral valve is normal in structure. Normal mobility of the mitral valve leaflets. Mild to moderate mitral valve regurgitation. No evidence of mitral valve stenosis. Tricuspid Valve: The tricuspid valve is normal in structure. Tricuspid valve regurgitation is  moderate . No evidence of tricuspid stenosis. Aortic Valve: The aortic valve is normal in structure. Aortic valve regurgitation is not visualized. Moderate aortic stenosis is present. Aortic valve mean gradient measures 20.3 mmHg. Aortic valve peak gradient measures 31.6 mmHg. Aortic valve area, by VTI measures 0.70 cm. Pulmonic Valve: The pulmonic valve was normal in structure. Pulmonic valve regurgitation is not visualized. No evidence of pulmonic stenosis. Aorta: The aortic root is normal in size and structure. Venous: The inferior vena cava is normal in size with greater than 50% respiratory variability, suggesting right atrial pressure of 3 mmHg. IAS/Shunts: No atrial level shunt detected by color flow Doppler.  LEFT VENTRICLE PLAX 2D LVIDd:         2.85 cm  Diastology LVIDs:         2.00 cm  LV e' lateral:   16.80 cm/s LV PW:         1.27 cm  LV E/e' lateral: 5.4 LV IVS:        1.58 cm  LV e' medial:  5.77 cm/s LVOT diam:     2.10 cm  LV E/e' medial:  15.7 LV SV:         53 LV SV Index:   22 LVOT Area:     3.46 cm  RIGHT VENTRICLE RV Basal diam:  5.43 cm RV S prime:     5.66 cm/s TAPSE (M-mode): 2.7 cm LEFT ATRIUM           Index       RIGHT ATRIUM           Index LA diam:      5.10 cm 2.08 cm/m  RA Area:     25.70 cm LA Vol (A4C): 60.2 ml 24.60 ml/m RA Volume:   93.30 ml  38.13 ml/m  AORTIC VALVE                    PULMONIC VALVE AV Area (Vmax):    0.85 cm     PV Vmax:        0.66 m/s AV Area (Vmean):   0.75 cm     PV Peak grad:   1.8 mmHg AV Area (VTI):     0.70 cm     RVOT Peak grad: 2 mmHg AV Vmax:           281.00 cm/s AV Vmean:          211.667 cm/s AV VTI:            0.753 m AV Peak Grad:      31.6 mmHg AV Mean Grad:      20.3 mmHg LVOT Vmax:         68.70 cm/s LVOT Vmean:        45.700 cm/s LVOT VTI:          0.153 m LVOT/AV VTI ratio: 0.20  AORTA Ao Root diam: 2.80 cm MITRAL VALVE               TRICUSPID VALVE MV Area (PHT): 3.68 cm    TR Peak grad:   60.8 mmHg MV Decel Time: 206 msec     TR Vmax:        390.00 cm/s MV E velocity: 90.70 cm/s MV A velocity: 39.80 cm/s  SHUNTS MV E/A ratio:  2.28        Systemic VTI:  0.15 m                            Systemic Diam: 2.10 cm Isaias Cowman MD Electronically signed by Isaias Cowman MD Signature Date/Time: 10/13/2019/4:02:10 PM    Final    US Abdomen Limited RUQ  Result Date: 10/13/2019 CLINICAL DATA:  Patient with NASH. EXAM: ULTRASOUND ABDOMEN LIMITED RIGHT UPPER QUADRANT COMPARISON:  None. FINDINGS: Gallbladder: Decompressed. Mild wall thickening measuring up to 5 mm. No cholelithiasis. Common bile duct: Diameter: 4 mm Liver: Increased in echogenicity. Portal vein is patent on color Doppler imaging with normal direction of blood flow towards the liver. Other: Moderate volume ascites. IMPRESSION: Mild gallbladder wall thickening which is nonspecific in etiology. No cholelithiasis or additional signs to suggest acute cholecystitis. Moderate volume ascites. Hepatic steatosis. Electronically Signed   By: Lovey Newcomer M.D.   On: 10/13/2019 10:44        Scheduled Meds: . apixaban  5 mg Oral BID  . Chlorhexidine Gluconate Cloth  6 each Topical Q0600  . collagenase  1 application Topical Daily  . dextroamphetamine  15 mg Oral TID  . feeding supplement (ENSURE ENLIVE)  237 mL Oral BID BM  . feeding supplement (PRO-STAT SUGAR FREE 64)  30 mL Oral TID  . fludrocortisone  0.1 mg Oral BID  . iron polysaccharides  150 mg Oral Daily  . midodrine  10 mg Oral TID WC  . modafinil  300 mg Oral BID  . multivitamin with minerals  1 tablet Oral Daily  . sodium chloride flush  3 mL Intravenous Q12H  . tiotropium  18 mcg Inhalation QHS  . torsemide  20 mg Oral Daily   Continuous Infusions: . sodium chloride    . albumin human Stopped (10/13/19 1405)  . meropenem (MERREM) IV Stopped (10/14/19 0556)  . phenylephrine (NEO-SYNEPHRINE) Adult infusion 35 mcg/min (10/14/19 0800)  . vancomycin       LOS: 3 days    Time spent: 45  minutes    Sharen Hones, MD [facility]   To contact the attending provider between 7A-7P or the covering provider during after hours 7P-7A, please log into the web site [email or web address] and access using universal [place] password for that web site. If you do not have the password, please call the hospital operator.  10/14/2019, 9:40 AM

## 2019-10-14 NOTE — Progress Notes (Signed)
[facility] Cardiology  SUBJECTIVE: Patient laying in bed, in no distress, denies chest pain or shortness of breath   Vitals:   10/13/19 1437 10/13/19 2000 10/14/19 0352 10/14/19 0400  BP:      Pulse:      Resp:      Temp:  98.3 F (36.8 C)  98.2 F (36.8 C)  TempSrc:  Oral  Oral  SpO2: 97%     Weight:   123.4 kg   Height:         Intake/Output Summary (Last 24 hours) at 10/14/2019 5852 Last data filed at 10/14/2019 0600 Gross per 24 hour  Intake 2207.34 ml  Output 325 ml  Net 1882.34 ml      PHYSICAL EXAM  General: Well developed, well nourished, in no acute distress HEENT:  Normocephalic and atramatic Neck:  No JVD.  Lungs: Clear bilaterally to auscultation and percussion. Heart: HRRR . Normal S1 and S2 without gallops or murmurs.  Abdomen: Bowel sounds are positive, abdomen soft and non-tender  Msk:  Back normal, normal gait. Normal strength and tone for age. Extremities: No clubbing, cyanosis or edema.   Neuro: Alert and oriented X 3. Psych:  Good affect, responds appropriately   LABS: Basic Metabolic Panel: Recent Labs    10/13/19 0521 10/14/19 0511  NA 140 137  K 3.8 3.9  CL 100 97*  CO2 32 33*  GLUCOSE 108* 110*  BUN 31* 27*  CREATININE 1.22 1.00  CALCIUM 8.6* 8.4*  MG 2.3 2.2  PHOS 3.2 2.8   Liver Function Tests: Recent Labs    10/11/19 1249 10/11/19 1507  AST 40  --   ALT 25  --   ALKPHOS 101  --   BILITOT 2.4*  --   PROT 5.8*  --   ALBUMIN 2.8* 2.5*   Recent Labs    10/11/19 1249  LIPASE 30   CBC: Recent Labs    10/13/19 0521 10/14/19 0511  WBC 5.7 5.3  NEUTROABS 3.5 3.1  HGB 8.7* 9.0*  HCT 28.8* 30.3*  MCV 84.0 85.8  PLT 154 167   Cardiac Enzymes: No results for input(s): CKTOTAL, CKMB, CKMBINDEX, TROPONINI in the last 72 hours. BNP: Invalid input(s): POCBNP D-Dimer: No results for input(s): DDIMER in the last 72 hours. Hemoglobin A1C: No results for input(s): HGBA1C in the last 72 hours. Fasting Lipid Panel: No  results for input(s): CHOL, HDL, LDLCALC, TRIG, CHOLHDL, LDLDIRECT in the last 72 hours. Thyroid Function Tests: No results for input(s): TSH, T4TOTAL, T3FREE, THYROIDAB in the last 72 hours.  Invalid input(s): FREET3 Anemia Panel: Recent Labs    10/13/19 0521  VITAMINB12 629  TIBC 265  IRON 20*    US RENAL  Result Date: 10/12/2019 CLINICAL DATA:  Acute renal insufficiency EXAM: RENAL / URINARY TRACT ULTRASOUND COMPLETE COMPARISON:  None. FINDINGS: Right Kidney: Renal measurements: 9.9 x 5.1 by 5.2 cm = volume: 138 mL . Echogenicity within normal limits. No mass or hydronephrosis visualized. Left Kidney: Renal measurements: 11.4 x 6.0 by 5.1 cm = volume: 183 mL. Echotexture is normal. There is a nonobstructing 7 mm calculus within the upper pole. No hydronephrosis. Bladder: Bladder is moderately distended without filling defect. Other: Small right pleural effusion is noted.  There is trace ascites. IMPRESSION: 1. Nonobstructing 7 mm left renal calculus. 2. Otherwise unremarkable appearance of the kidneys. 3. Small right pleural effusion. 4. Trace ascites. Electronically Signed   By: Randa Ngo M.D.   On: 10/12/2019 17:31   ECHOCARDIOGRAM  COMPLETE  Result Date: 10/13/2019    ECHOCARDIOGRAM REPORT   Patient Name:   Miguel Palmer Date of Exam: 10/13/2019 Medical Rec #:  [id number]            Height:       74.0 in Accession #:    [id number]           Weight:       264.3 lb Date of Birth:  1941/01/18             BSA:          2.447 m Patient Age:    79 years             BP:           93/57 mmHg Patient Gender: M                    HR:           99 bpm. Exam Location:  [facility] Procedure: 2D Echo, Color Doppler and Cardiac Doppler Indications:     Dyspnea 786.09  History:         Patient has prior history of Echocardiogram examinations, most                  recent 05/18/2016. COPD; Risk Factors:Diabetes. Edema.  Sonographer:     Sherrie Sport RDCS (AE) Referring Phys:  [place] Diagnosing  Phys: Isaias Cowman MD  Sonographer Comments: Image acquisition challenging due to COPD. IMPRESSIONS  1. Left ventricular ejection fraction, by estimation, is 60 to 65%. The left ventricle has normal function. The left ventricle has no regional wall motion abnormalities. There is mild left ventricular hypertrophy. Left ventricular diastolic parameters were normal.  2. Right ventricular systolic function is normal. The right ventricular size is moderately enlarged. There is severely elevated pulmonary artery systolic pressure.  3. Right atrial size was moderately dilated.  4. The mitral valve is normal in structure. Mild to moderate mitral valve regurgitation. No evidence of mitral stenosis.  5. Tricuspid valve regurgitation is moderate.  6. The aortic valve is normal in structure. Aortic valve regurgitation is not visualized. Moderate aortic valve stenosis.  7. The inferior vena cava is normal in size with greater than 50% respiratory variability, suggesting right atrial pressure of 3 mmHg. FINDINGS  Left Ventricle: Left ventricular ejection fraction, by estimation, is 60 to 65%. The left ventricle has normal function. The left ventricle has no regional wall motion abnormalities. The left ventricular internal cavity size was normal in size. There is  mild left ventricular hypertrophy. Left ventricular diastolic parameters were normal. Right Ventricle: The right ventricular size is moderately enlarged. No increase in right ventricular wall thickness. Right ventricular systolic function is normal. There is severely elevated pulmonary artery systolic pressure. The tricuspid regurgitant velocity is 3.90 m/s, and with an assumed right atrial pressure of 10 mmHg, the estimated right ventricular systolic pressure is 82.9 mmHg. Left Atrium: Left atrial size was normal in size. Right Atrium: Right atrial size was moderately dilated. Pericardium: There is no evidence of pericardial effusion. Mitral Valve: The mitral  valve is normal in structure. Normal mobility of the mitral valve leaflets. Mild to moderate mitral valve regurgitation. No evidence of mitral valve stenosis. Tricuspid Valve: The tricuspid valve is normal in structure. Tricuspid valve regurgitation is moderate . No evidence of tricuspid stenosis. Aortic Valve: The aortic valve is normal in structure. Aortic valve regurgitation is not visualized. Moderate aortic stenosis  is present. Aortic valve mean gradient measures 20.3 mmHg. Aortic valve peak gradient measures 31.6 mmHg. Aortic valve area, by VTI measures 0.70 cm. Pulmonic Valve: The pulmonic valve was normal in structure. Pulmonic valve regurgitation is not visualized. No evidence of pulmonic stenosis. Aorta: The aortic root is normal in size and structure. Venous: The inferior vena cava is normal in size with greater than 50% respiratory variability, suggesting right atrial pressure of 3 mmHg. IAS/Shunts: No atrial level shunt detected by color flow Doppler.  LEFT VENTRICLE PLAX 2D LVIDd:         2.85 cm  Diastology LVIDs:         2.00 cm  LV e' lateral:   16.80 cm/s LV PW:         1.27 cm  LV E/e' lateral: 5.4 LV IVS:        1.58 cm  LV e' medial:    5.77 cm/s LVOT diam:     2.10 cm  LV E/e' medial:  15.7 LV SV:         53 LV SV Index:   22 LVOT Area:     3.46 cm  RIGHT VENTRICLE RV Basal diam:  5.43 cm RV S prime:     5.66 cm/s TAPSE (M-mode): 2.7 cm LEFT ATRIUM           Index       RIGHT ATRIUM           Index LA diam:      5.10 cm 2.08 cm/m  RA Area:     25.70 cm LA Vol (A4C): 60.2 ml 24.60 ml/m RA Volume:   93.30 ml  38.13 ml/m  AORTIC VALVE                    PULMONIC VALVE AV Area (Vmax):    0.85 cm     PV Vmax:        0.66 m/s AV Area (Vmean):   0.75 cm     PV Peak grad:   1.8 mmHg AV Area (VTI):     0.70 cm     RVOT Peak grad: 2 mmHg AV Vmax:           281.00 cm/s AV Vmean:          211.667 cm/s AV VTI:            0.753 m AV Peak Grad:      31.6 mmHg AV Mean Grad:      20.3 mmHg LVOT Vmax:          68.70 cm/s LVOT Vmean:        45.700 cm/s LVOT VTI:          0.153 m LVOT/AV VTI ratio: 0.20  AORTA Ao Root diam: 2.80 cm MITRAL VALVE               TRICUSPID VALVE MV Area (PHT): 3.68 cm    TR Peak grad:   60.8 mmHg MV Decel Time: 206 msec    TR Vmax:        390.00 cm/s MV E velocity: 90.70 cm/s MV A velocity: 39.80 cm/s  SHUNTS MV E/A ratio:  2.28        Systemic VTI:  0.15 m                            Systemic Diam: 2.10 cm Isaias Cowman MD Electronically signed by  Isaias Cowman MD Signature Date/Time: 10/13/2019/4:02:10 PM    Final    US Abdomen Limited RUQ  Result Date: 10/13/2019 CLINICAL DATA:  Patient with NASH. EXAM: ULTRASOUND ABDOMEN LIMITED RIGHT UPPER QUADRANT COMPARISON:  None. FINDINGS: Gallbladder: Decompressed. Mild wall thickening measuring up to 5 mm. No cholelithiasis. Common bile duct: Diameter: 4 mm Liver: Increased in echogenicity. Portal vein is patent on color Doppler imaging with normal direction of blood flow towards the liver. Other: Moderate volume ascites. IMPRESSION: Mild gallbladder wall thickening which is nonspecific in etiology. No cholelithiasis or additional signs to suggest acute cholecystitis. Moderate volume ascites. Hepatic steatosis. Electronically Signed   By: Lovey Newcomer M.D.   On: 10/13/2019 10:44     Echo normal left ventricular function, LVEF 60 to 65%, right ventricular and right atrial enlargement consistent with COPD, estimated RVSP 70.8 mmHg, moderate aortic stenosis, with calculated aortic valve area 0.7 cm, mean gradient of only 20.3 mmHg, peak gradient 31.6 mmHg  TELEMETRY: Atrial fibrillation at a rate of 100 bpm:  ASSESSMENT AND PLAN:  Active Problems:   CHF (congestive heart failure) (HCC)   Hypotension   Goals of care, counseling/discussion   DNR (do not resuscitate)   Malnutrition of moderate degree   Palliative care by specialist    1.  Hypotension, currently on phenylephrine drip, tolerated well, patient mentating  well, does not appear toxic, etiology likely multifactorial, underlying poor overall health with anasarca, possible underlying sepsis, with nonhealing ulcer right lower extremity, does not appear to be cardiogenic with normal left ventricular function and moderate aortic stenosis of uncertain clinical significance 2.  Anasarca, multifactorial, primarily due to overall poor health, chronic kidney disease, right-sided heart failure, with normal left ventricular function 3.  Moderate aortic stenosis, of uncertain clinical significance, patient not a candidate for TAVR or SAVR 4.  Chronic atrial fibrillation, rate mildly elevated, on Eliquis for stroke prevention 5.  Borderline elevated high-sensitivity troponin, (72, 63) in the absence of chest pain or ischemic ECG changes, likely demand supply ischemia 6.  Acute on chronic kidney disease, followed by nephrology, improving 7.  Pulmonary hypertension, with history of COPD, 2D echocardiogram reveals moderate right ventricular and right atrial enlargement, with elevated RVSP 70.8 mmHg  Recommendations  1.  Agree with overall current therapy 2.  Defer full dose anticoagulation 3.  Defer further cardiac diagnostics at this time 4.  Continue Eliquis for stroke prevention 5.  Agree stopping torsemide for now 6.  Taper phenylephrine drip when possible 7.  No further recommendations at this time  Sign off for now, please call if any questions   Isaias Cowman, MD, PhD, [facility] 10/14/2019 8:17 AM

## 2019-10-14 NOTE — TOC Progression Note (Addendum)
Transition of Care [facility]) - Progression Note    Patient Details  Name: Miguel Palmer MRN: [id number] Date of Birth: 1941-03-31  [facility] CM/SW [place], LCSW Phone Number: 10/14/2019, 12:08 PM  Clinical Narrative:   CSW met with patient and spouse at bedside to discuss PT and OT recommendations. Patient reported it is up to his wife. Wife, Miguel Palmer, said they would be agreeable to Home Health services at discharge. She prefers [facility] as they have used them in the past. She reported she does not have a preference if [facility] is unable to accept patient. Referral sent to [facility]. Discussed recommendation for 3 in 1. Miguel Palmer was agreeable to 3 in 1 being ordered through [facility]. Updated [facility] Representative Brad. Miguel Palmer reported patient has access to a wheelchair at home and uses a rollator often. Wife plans to talk with patient's PCP about getting him a motorized wheelchair. CSW will continue to follow.  2:35- [facility] Representative Corene Cornea unable to accept patient for home health services. Referral made to [facility]. Helene Kelp unable to accept patient.   2:50- Reached out to [facility] Representative [place], [place], [facility] Representative Cassie, [facility] Representative Malachy Mood. All are unable to accept patient at this time.   Spoke to [facility] again. They do not have the staffing right now but may be able to take patient if he discharges next week. Patient is not ready for discharge yet per chart.   CSW will continue to follow.     Expected Discharge Plan: [place] Barriers to Discharge: Continued Medical Work up  Expected Discharge Plan and Services Expected Discharge Plan: [place]   Discharge Planning Services: CM Consult   Living arrangements for the past 2 months: Apartment                                       Social  Determinants of Health (SDOH) Interventions    Readmission Risk Interventions No flowsheet data found.

## 2019-10-14 NOTE — Progress Notes (Signed)
Name: Miguel Palmer MRN: [id number] DOB: 1940-12-06    ADMISSION DATE:  10/11/2019 CONSULTATION DATE: 10/12/2019  REFERRING MD : Rufina Falco, NP   CHIEF COMPLAINT: Hypotension   BRIEF PATIENT DESCRIPTION:  79 yo male admitted with hypotension concerning for sepsis vs cardiogenic shock, hypovolemia secondary to diarrhea, and chronic lymphedema requiring lasix gtt requiring transfer to the stepdown unit due to worsening hypotension   SIGNIFICANT EVENTS/STUDIES:  04/20: Pt admitted to the progressive care unit with lasix gtt infusing 04/21: Pt developed hypotension requiring transfer to the stepdown unit.  Lasix gtt discontinued and pt received iv albumin x1 dose  10/14/19- patient is still on neosynephrine low dose. 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   HISTORY OF PRESENT ILLNESS:   This is a 79 yo male with a PMH of Obesity, Testicular Hypofunction, Narcolepsy, Memory Change, Frank Hematuria, Chronic Lymphoedema, Type II Diabetes Mellitus, COPD, Bicep Tendinitis, and Arteriosclerosis.  He presented to [facility] ER on 04/20 from [place] with hypotension and concern of sepsis.  It was also reported the pt had multiple RLE wounds and worsening bilateral lower extremity edema secondary to chronic lymphedema. He reports he has been wrapping his right leg in trash bags to collect the fluid.  He also reported having diarrhea x1 week after taking laxatives. In the ER initial bp 80/50, however after receiving 700 ml NS bolus bp increased to 92/60.  In the ER lab results revealed Na+ 131, chloride 93, BUN 47, creatinine 1.00, calcium 8.6, albumin 2.8, BNP 1,906, troponin 72, and hgb 8.4.  Influenza PCR/COVID-19 negative and CXR revealed no acute abnormality.  He received 500 ml NS bolus with improvement of bp. He was admitted to the progressive care unit per hospitalist team and lasix gtt initiated.  However, on 04/21 pt developed  hypotension requiring transfer to the stepdown unit PCCM team consulted to assist with management.   PAST MEDICAL HISTORY :   has a past medical history of Arteriosclerosis, Biceps tendinitis (05/29/2014), COPD (chronic obstructive pulmonary disease) ([place]), Diabetes mellitus without complication ([place]), Disorder of rotator cuff (05/29/2014), Edema, Pilar Plate hematuria (04/28/2013), Memory change (01/13/2014), Narcolepsy, Obesity, Other testicular hypofunction (03/30/2011), and Oxygen deficiency.  has a past surgical history that includes Coronary stent placement and Knee arthroscopy (Right). Prior to Admission medications   Medication Sig Start Date End Date Taking? Authorizing Provider  acetaminophen (TYLENOL) 325 MG tablet Take 2 tablets (650 mg total) by mouth every 6 (six) hours as needed for mild pain (or Fever >/= 101). 06/27/16  Yes Gouru, Illene Silver, MD  ascorbic acid (VITAMIN C) 500 MG tablet Take 500 mg by mouth 3 (three) times daily.    Yes [provider]  chlorhexidine (PERIDEX) 0.12 % solution Use as directed 15 mLs in the mouth or throat 2 (two) times daily. After breakfast and before bedtime 09/05/19  Yes [provider]  Cholecalciferol (VITAMIN D3) 50 MCG (2000 UT) capsule Take 2,000 Units by mouth daily.   Yes [provider]  Coenzyme Q10 100 MG TABS Take 100 mg by mouth 4 (four) times daily.    Yes [provider]  dextroamphetamine (DEXEDRINE SPANSULE) 15 MG 24 hr capsule Take 1 capsule by mouth 3 (three) times daily.  05/29/16  Yes [provider]  DHA-PHOSPHATIDYLSERINE PO Take 100 mg by mouth in the morning, at noon, and at bedtime.    Yes [provider]  ELIQUIS 5 MG  TABS tablet Take 5 mg by mouth 2 (two) times daily. 07/28/19  Yes [provider]  gabapentin (NEURONTIN) 400 MG capsule Take 400 mg by mouth 4 (four) times daily.   Yes [provider]  Ginkgo Biloba 60 MG TABS Take 60 mg by mouth 2 (two) times daily.    Yes  [provider]  Glucosamine Sulfate 1000 MG CAPS Take 1,000 mg by mouth 2 (two) times daily.    Yes [provider]  guaiFENesin (MUCINEX) 600 MG 12 hr tablet Take 600 mg by mouth 2 (two) times daily as needed.    Yes [provider]  metoprolol succinate (TOPROL-XL) 50 MG 24 hr tablet Take 50 mg by mouth daily. TAKE 1 TABLET BY MOUTH EVERY DAY 08/11/14  Yes [provider]  modafinil (PROVIGIL) 100 MG tablet Take 300 mg by mouth daily.  03/26/16  Yes [provider]  Multiple Vitamins-Minerals (MULTIVITAMIN & MINERAL PO) Take by mouth.   Yes [provider]  nitroGLYCERIN (NITROSTAT) 0.4 MG SL tablet Place 0.4 mg under the tongue every 5 (five) minutes as needed for chest pain.  10/09/13  Yes [provider]  PHOSPHATIDYLSERINE PO Take 60 mg by mouth 3 (three) times daily.   Yes [provider]  rosuvastatin (CRESTOR) 20 MG tablet Take 20 mg by mouth at bedtime.  10/02/14  Yes [provider]  tiotropium (SPIRIVA) 18 MCG inhalation capsule Place 1 capsule (18 mcg total) into inhaler and inhale daily. 06/28/16  Yes Gouru, Illene Silver, MD  torsemide (DEMADEX) 20 MG tablet Take 2 tablets (24m) by mouth once daily.   Yes [provider]   Allergies  Allergen Reactions  . Bee Pollen Shortness Of Breath    Post nasal drip  . Pollen Extract Shortness Of Breath    Post nasal drip Post nasal drip  . Codeine Other (See Comments)  . Morphine Itching    (+) itching and redness noted to IV insertion site following administration. See ED nurses note from 11/25 at 0449. (+) itching and redness noted to IV insertion site following administration. See ED nurses note from 11/25 at 0449.   .Marland KitchenMorphine And Related Itching    (+) itching and redness noted to IV insertion site following administration. See ED nurses note from 11/25 at 0449.    FAMILY HISTORY:  family history includes Alzheimer's disease in his father; Asthma in  his father; Dementia in his mother; Emphysema in his father. SOCIAL HISTORY:  reports that he has quit smoking. His smoking use included cigarettes. He has a 44.00 pack-year smoking history. He has never used smokeless tobacco. He reports that he does not drink alcohol or use drugs.  REVIEW OF SYSTEMS: Possible in BOLD  Constitutional: Negative for fever, chills, weight loss, malaise/fatigue and diaphoresis.  HENT: Negative for hearing loss, ear pain, nosebleeds, congestion, sore throat, neck pain, tinnitus and ear discharge.   Eyes: Negative for blurred vision, double vision, photophobia, pain, discharge and redness.  Respiratory: Negative for cough, hemoptysis, sputum production, shortness of breath, wheezing and stridor.   Cardiovascular: chest pain, palpitations, orthopnea, claudication, bilateral leg swelling (chronic BLE lymphedema) and PND.  Gastrointestinal: heartburn, nausea, vomiting, abdominal pain, diarrhea, constipation, blood in stool and melena.  Genitourinary: Negative for dysuria, urgency, frequency, hematuria and flank pain.  Musculoskeletal: Negative for myalgias, back pain, joint pain and falls.  Skin: abrasion to right lower extremity itching and rash.  Neurological: Negative for dizziness, tingling, tremors, sensory change, speech change,  focal weakness, seizures, loss of consciousness, weakness and headaches.  Endo/Heme/Allergies: Negative for environmental allergies and polydipsia. Does not bruise/bleed easily.  SUBJECTIVE:  No complaints at this time states " I'm hungry"  VITAL SIGNS: Temp:  [97.6 F (36.4 C)-98.3 F (36.8 C)] 97.6 F (36.4 C) (04/23 0800) Pulse Rate:  [87-123] 95 (04/23 1130) Resp:  [13-21] 13 (04/23 1130) BP: (80-95)/(60-65) 95/64 (04/23 1130) SpO2:  [95 %] 95 % (04/23 0815) Weight:  [123.4 kg] 123.4 kg (04/23 0352)  PHYSICAL EXAMINATION: General: chronically ill appearing male, NAD resting in bed  Neuro: alert and oriented, follows  commands HEENT: supple, no JVD  Cardiovascular: irregular irregular, no R/G  Lungs: clear throughout, even, non labored  Abdomen: +BS x4, obese, 2+ abdominal swelling, non tender  Musculoskeletal: 3+ pitting bilateral lower extremity edema (chronic BLE lymphedema) Skin: right lower extremity abrasion with clear drainage and venous stasis color changes of BLE  Recent Labs  Lab 10/12/19 0427 10/13/19 0521 10/14/19 0511  NA 134* 140 137  K 4.1 3.8 3.9  CL 93* 100 97*  CO2 30 32 33*  BUN 41* 31* 27*  CREATININE 1.73* 1.22 1.00  GLUCOSE 107* 108* 110*   Recent Labs  Lab 10/12/19 0624 10/13/19 0521 10/14/19 0511  HGB 7.7* 8.7* 9.0*  HCT 25.9* 28.8* 30.3*  WBC 7.6 5.7 5.3  PLT 153 154 167   ECHOCARDIOGRAM COMPLETE  Result Date: 10/13/2019    ECHOCARDIOGRAM REPORT   Patient Name:   MACYN REMMERT Date of Exam: 10/13/2019 Medical Rec #:  [id number]            Height:       74.0 in Accession #:    [id number]           Weight:       264.3 lb Date of Birth:  Aug 01, 1940             BSA:          2.447 m Patient Age:    73 years             BP:           93/57 mmHg Patient Gender: M                    HR:           99 bpm. Exam Location:  [facility] Procedure: 2D Echo, Color Doppler and Cardiac Doppler Indications:     Dyspnea 786.09  History:         Patient has prior history of Echocardiogram examinations, most                  recent 05/18/2016. COPD; Risk Factors:Diabetes. Edema.  Sonographer:     Sherrie Sport RDCS (AE) Referring Phys:  [place] Diagnosing Phys: Isaias Cowman MD  Sonographer Comments: Image acquisition challenging due to COPD. IMPRESSIONS  1. Left ventricular ejection fraction, by estimation, is 60 to 65%. The left ventricle has normal function. The left ventricle has no regional wall motion abnormalities. There is mild left ventricular hypertrophy. Left ventricular diastolic parameters were normal.  2. Right ventricular systolic function is normal. The right  ventricular size is moderately enlarged. There is severely elevated pulmonary artery systolic pressure.  3. Right atrial size was moderately dilated.  4. The mitral valve is normal in structure. Mild to moderate mitral valve regurgitation. No evidence of mitral stenosis.  5. Tricuspid valve regurgitation is moderate.  6. The  aortic valve is normal in structure. Aortic valve regurgitation is not visualized. Moderate aortic valve stenosis.  7. The inferior vena cava is normal in size with greater than 50% respiratory variability, suggesting right atrial pressure of 3 mmHg. FINDINGS  Left Ventricle: Left ventricular ejection fraction, by estimation, is 60 to 65%. The left ventricle has normal function. The left ventricle has no regional wall motion abnormalities. The left ventricular internal cavity size was normal in size. There is  mild left ventricular hypertrophy. Left ventricular diastolic parameters were normal. Right Ventricle: The right ventricular size is moderately enlarged. No increase in right ventricular wall thickness. Right ventricular systolic function is normal. There is severely elevated pulmonary artery systolic pressure. The tricuspid regurgitant velocity is 3.90 m/s, and with an assumed right atrial pressure of 10 mmHg, the estimated right ventricular systolic pressure is 23.5 mmHg. Left Atrium: Left atrial size was normal in size. Right Atrium: Right atrial size was moderately dilated. Pericardium: There is no evidence of pericardial effusion. Mitral Valve: The mitral valve is normal in structure. Normal mobility of the mitral valve leaflets. Mild to moderate mitral valve regurgitation. No evidence of mitral valve stenosis. Tricuspid Valve: The tricuspid valve is normal in structure. Tricuspid valve regurgitation is moderate . No evidence of tricuspid stenosis. Aortic Valve: The aortic valve is normal in structure. Aortic valve regurgitation is not visualized. Moderate aortic stenosis is present.  Aortic valve mean gradient measures 20.3 mmHg. Aortic valve peak gradient measures 31.6 mmHg. Aortic valve area, by VTI measures 0.70 cm. Pulmonic Valve: The pulmonic valve was normal in structure. Pulmonic valve regurgitation is not visualized. No evidence of pulmonic stenosis. Aorta: The aortic root is normal in size and structure. Venous: The inferior vena cava is normal in size with greater than 50% respiratory variability, suggesting right atrial pressure of 3 mmHg. IAS/Shunts: No atrial level shunt detected by color flow Doppler.  LEFT VENTRICLE PLAX 2D LVIDd:         2.85 cm  Diastology LVIDs:         2.00 cm  LV e' lateral:   16.80 cm/s LV PW:         1.27 cm  LV E/e' lateral: 5.4 LV IVS:        1.58 cm  LV e' medial:    5.77 cm/s LVOT diam:     2.10 cm  LV E/e' medial:  15.7 LV SV:         53 LV SV Index:   22 LVOT Area:     3.46 cm  RIGHT VENTRICLE RV Basal diam:  5.43 cm RV S prime:     5.66 cm/s TAPSE (M-mode): 2.7 cm LEFT ATRIUM           Index       RIGHT ATRIUM           Index LA diam:      5.10 cm 2.08 cm/m  RA Area:     25.70 cm LA Vol (A4C): 60.2 ml 24.60 ml/m RA Volume:   93.30 ml  38.13 ml/m  AORTIC VALVE                    PULMONIC VALVE AV Area (Vmax):    0.85 cm     PV Vmax:        0.66 m/s AV Area (Vmean):   0.75 cm     PV Peak grad:   1.8 mmHg AV Area (VTI):  0.70 cm     RVOT Peak grad: 2 mmHg AV Vmax:           281.00 cm/s AV Vmean:          211.667 cm/s AV VTI:            0.753 m AV Peak Grad:      31.6 mmHg AV Mean Grad:      20.3 mmHg LVOT Vmax:         68.70 cm/s LVOT Vmean:        45.700 cm/s LVOT VTI:          0.153 m LVOT/AV VTI ratio: 0.20  AORTA Ao Root diam: 2.80 cm MITRAL VALVE               TRICUSPID VALVE MV Area (PHT): 3.68 cm    TR Peak grad:   60.8 mmHg MV Decel Time: 206 msec    TR Vmax:        390.00 cm/s MV E velocity: 90.70 cm/s MV A velocity: 39.80 cm/s  SHUNTS MV E/A ratio:  2.28        Systemic VTI:  0.15 m                            Systemic Diam: 2.10  cm Isaias Cowman MD Electronically signed by Isaias Cowman MD Signature Date/Time: 10/13/2019/4:02:10 PM    Final    US Abdomen Limited RUQ  Result Date: 10/13/2019 CLINICAL DATA:  Patient with NASH. EXAM: ULTRASOUND ABDOMEN LIMITED RIGHT UPPER QUADRANT COMPARISON:  None. FINDINGS: Gallbladder: Decompressed. Mild wall thickening measuring up to 5 mm. No cholelithiasis. Common bile duct: Diameter: 4 mm Liver: Increased in echogenicity. Portal vein is patent on color Doppler imaging with normal direction of blood flow towards the liver. Other: Moderate volume ascites. IMPRESSION: Mild gallbladder wall thickening which is nonspecific in etiology. No cholelithiasis or additional signs to suggest acute cholecystitis. Moderate volume ascites. Hepatic steatosis. Electronically Signed   By: Lovey Newcomer M.D.   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 -Unlikely cardiogenic since cardiac work-up has been unremarkable thus far -We will proceed with gentle rehydration today with IVF Continuous telemetry monitoring  -continue florinef and midodrine  Acute renal failure-resolved creatinine trending down  Hypoalbuminemia Trend BMP  Replace electrolytes as indicated  Monitor UOP Avoid nephrotoxic medications  IV albumin x1 dose   Possible RLE infected wound  s/p surgery consult - no signs of infection - appreciate input Trend WBC and  monitor fever Trend PCT-if elevated will start abx -culture + for acinetobacter and patient is on santyl and unasyn  Critical care provider statement:    Critical care time (minutes):  33   Critical care time was exclusive of:  Separately billable procedures and  treating other patients   Critical care was necessary to treat or prevent imminent or  life-threatening deterioration of the following conditions:  acute  hypoxemic respiratory failure, anasarca, multiple comorbid conditions   Critical care was time spent personally by me on the following  activities:  Development of treatment plan with patient or surrogate,  discussions with consultants, evaluation of patient's response to  treatment, examination of patient, obtaining history from patient or  surrogate, ordering and performing treatments and interventions, ordering  and review of laboratory studies and re-evaluation of patient's condition   I assumed direction of critical care for this patient from another  provider in my specialty: no      Ottie Glazier, M.D.  [facility] [place]

## 2019-10-14 NOTE — Progress Notes (Signed)
PT Cancellation Note  Patient Details Name: Miguel Palmer MRN: [id number] DOB: 1940-10-27   Cancelled Treatment:     PT attempt. Pt is seated at EOB in the nude with spouse at bedside. He is HOH.  Sao2 83% but pt refuses to put O2 back on. He refused PT at this time and very resistive to care throughout the day today per RN. Acute PT will continue to follow per POC and return when pt is more appropriate.     Willette Pa 10/14/2019, 3:15 PM

## 2019-10-14 NOTE — Progress Notes (Signed)
OT Cancellation Note  Patient Details Name: Miguel Palmer MRN: [id number] DOB: 1941/04/07   Cancelled Treatment:    Reason Eval/Treat Not Completed: Other (comment). Upon attempt, pt with RN, unavailable for OT tx. Per chart, pt also recently declined PT. Will re-attempt at later date/time as medically appropriate.   Jeni Salles, MPH, MS, OTR/L ascom [phone] 10/14/19, 3:30 PM

## 2019-10-14 NOTE — Consult Note (Signed)
Subjective:   CC: non-healing RLE wound  HPI:  Miguel Palmer is a 79 y.o. male who was consulted by Lanney Gins for evaluation of above.  First noted a few weeks ago.  Symptoms include: no pain, some weeping from wound, seems to not heal, but not worsen.  No reported purulence. Chronic lymphedema in BLE, thinks its worse on R side recently. Admitted for hypotension and concern for sepsis, also noted to have moderate ascited on recent US.  Per patent and family report, hit the area on the care door and has not healed since. no chronic care for his swelling ever recommended.   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  Past Surgical History:  has a past surgical history that includes Coronary stent placement and Knee arthroscopy (Right).  Family History: family history includes Alzheimer's disease in his father; Asthma in his father; Dementia in his mother; Emphysema in his father.  Social History:  reports that he has quit smoking. His smoking use included cigarettes. He has a 44.00 pack-year smoking history. He has never used smokeless tobacco. He reports that he does not drink alcohol or use drugs.  Current Medications:  Medications Prior to Admission  Medication Sig Dispense Refill  . acetaminophen (TYLENOL) 325 MG tablet Take 2 tablets (650 mg total) by mouth every 6 (six) hours as needed for mild pain (or Fever >/= 101).    Marland Kitchen ascorbic acid (VITAMIN C) 500 MG tablet Take 500 mg by mouth 3 (three) times daily.     . chlorhexidine (PERIDEX) 0.12 % solution Use as directed 15 mLs in the mouth or throat 2 (two) times daily. After breakfast and before  bedtime    . Cholecalciferol (VITAMIN D3) 50 MCG (2000 UT) capsule Take 2,000 Units by mouth daily.    . Coenzyme Q10 100 MG TABS Take 100 mg by mouth 4 (four) times daily.     Marland Kitchen dextroamphetamine (DEXEDRINE SPANSULE) 15 MG 24 hr capsule Take 1 capsule by mouth 3 (three) times daily.   0  . DHA-PHOSPHATIDYLSERINE PO Take 100 mg by mouth in the morning, at noon, and at bedtime.     Marland Kitchen ELIQUIS 5 MG TABS tablet Take 5 mg by mouth 2 (two) times daily.    Marland Kitchen gabapentin (NEURONTIN) 400 MG capsule Take 400 mg by mouth 4 (four) times daily.    . Ginkgo Biloba 60 MG TABS Take 60 mg by mouth 2 (two) times daily.     . Glucosamine Sulfate 1000 MG CAPS Take 1,000 mg by mouth 2 (two) times daily.     Marland Kitchen guaiFENesin (MUCINEX) 600 MG 12 hr tablet Take 600 mg by mouth 2 (two) times daily as needed.     . metoprolol succinate (TOPROL-XL) 50 MG 24 hr tablet Take 50 mg by mouth daily. TAKE 1 TABLET BY MOUTH EVERY DAY    . modafinil (PROVIGIL) 100 MG tablet Take 300 mg by mouth daily.     . Multiple Vitamins-Minerals (MULTIVITAMIN & MINERAL PO) Take by mouth.    . nitroGLYCERIN (NITROSTAT) 0.4 MG SL tablet Place 0.4 mg under the tongue every 5 (five) minutes as needed for chest pain.     Marland Kitchen PHOSPHATIDYLSERINE PO Take 60 mg by mouth 3 (three) times daily.    Marland Kitchen  rosuvastatin (CRESTOR) 20 MG tablet Take 20 mg by mouth at bedtime.     Marland Kitchen tiotropium (SPIRIVA) 18 MCG inhalation capsule Place 1 capsule (18 mcg total) into inhaler and inhale daily. 30 capsule 0  . torsemide (DEMADEX) 20 MG tablet Take 2 tablets (40m) by mouth once daily.      Allergies:  Allergies  Allergen Reactions  . Bee Pollen Shortness Of Breath    Post nasal drip  . Pollen Extract Shortness Of Breath    Post nasal drip Post nasal drip  . Codeine Other (See Comments)  . Morphine Itching    (+) itching and redness noted to IV insertion site following administration. See ED nurses note from 11/25 at 0449. (+) itching and redness noted to IV  insertion site following administration. See ED nurses note from 11/25 at 0449.   .Marland KitchenMorphine And Related Itching    (+) itching and redness noted to IV insertion site following administration. See ED nurses note from 11/25 at 0449.    ROS:  General: Denies weight loss, weight gain, fatigue, fevers, chills, and night sweats. Eyes: Denies blurry vision, double vision, eye pain, itchy eyes, and tearing. Ears: Denies hearing loss, earache, and ringing in ears. Nose: Denies sinus pain, congestion, infections, runny nose, and nosebleeds. Mouth/throat: Denies hoarseness, sore throat, bleeding gums, and difficulty swallowing. Heart: Denies chest pain, palpitations, racing heart, irregular heartbeat, leg pain or swelling, and decreased activity tolerance. Respiratory: Denies breathing difficulty, shortness of breath, wheezing, cough, and sputum. GI: Denies change in appetite, heartburn, nausea, vomiting, constipation, diarrhea, and blood in stool. GU: Denies difficulty urinating, pain with urinating, urgency, frequency, blood in urine. Musculoskeletal: Denies joint stiffness, pain, swelling, muscle weakness. Skin: Denies rash, itching, mass, tumors, sores, and boils Neurologic: Denies headache, fainting, dizziness, seizures, numbness, and tingling. Psychiatric: Denies depression, anxiety, difficulty sleeping, and memory loss. Endocrine: Denies heat or cold intolerance, and increased thirst or urination. Blood/lymph: Denies easy bruising, easy bruising, and swollen glands     Objective:     BP 95/64   Pulse 95   Temp 97.6 F (36.4 C) (Oral)   Resp 13   Ht 6' 2"  (1.88 m)   Wt 123.4 kg   SpO2 95%   BMI 34.93 kg/m   Constitutional :  alert, cooperative, appears stated age and no distress  Lymphatics/Throat:  no asymmetry, masses, or scars  Respiratory:  clear to auscultation bilaterally  Cardiovascular:  regularly irregular rhythm  Gastrointestinal: soft, non-tender; bowel sounds  normal; no masses,  no organomegaly.  Musculoskeletal: Steady gait and movement  Skin: Cool and moist, BLE skin discoloration and swelling consistent with lymphedema and venous insufficiency.  3cm x 3cm superficial wound on RLE mid tibia. No exudative, purulent drainage, non-tender, with no surrounding erythema or induration.  Active serous drainage noted.  Smaller supeficial wound noted cephalad to main wound.  Psychiatric: Normal affect, non-agitated, not confused       LABS:  CMP Latest Ref Rng & Units 10/14/2019 10/13/2019 10/12/2019  Glucose 70 - 99 mg/dL 110(H) 108(H) 107(H)  BUN 8 - 23 mg/dL 27(H) 31(H) 41(H)  Creatinine 0.61 - 1.24 mg/dL 1.00 1.22 1.73(H)  Sodium 135 - 145 mmol/L 137 140 134(L)  Potassium 3.5 - 5.1 mmol/L 3.9 3.8 4.1  Chloride 98 - 111 mmol/L 97(L) 100 93(L)  CO2 22 - 32 mmol/L 33(H) 32 30  Calcium 8.9 - 10.3 mg/dL 8.4(L) 8.6(L) 8.5(L)  Total Protein 6.5 - 8.1 g/dL - - -  Total  Bilirubin 0.3 - 1.2 mg/dL - - -  Alkaline Phos 38 - 126 U/L - - -  AST 15 - 41 U/L - - -  ALT 0 - 44 U/L - - -   CBC Latest Ref Rng & Units 10/14/2019 10/13/2019 10/12/2019  WBC 4.0 - 10.5 K/uL 5.3 5.7 7.6  Hemoglobin 13.0 - 17.0 g/dL 9.0(L) 8.7(L) 7.7(L)  Hematocrit 39.0 - 52.0 % 30.3(L) 28.8(L) 25.9(L)  Platelets 150 - 400 K/uL 167 154 153    RADS: n/a  Assessment:      RLE wound, superficial, no need for debridement BLE swelling Ascites hypotension  Plan:     Recommend continued local wound care with wet to dry dressing change and wrapping from toe to knee with kerlex and ACE wrap bilaterally to minimize swelling that is contributing to poor wound healing in addition to his other chronic medical conditions.  No evidence of infection at this point, so no need for any debridement at this time.  Also recommend vascular surgery for BLE edema/venous insufficiency workup as output basis and monitoring.  Surgery to sign off, please call with questions.

## 2019-10-14 NOTE — Progress Notes (Addendum)
RN came in to administer medications. Pt is trying to use bedside commode at this time. Is agitated and does not want assistance. Refuses oxygen at this time stating that it is not a priority. Pt expressed having difficulty voiding at this time. Refuses to get in bed for bladder scan and says that he does not want in and out catheter. Will come back to administer medications.

## 2019-10-15 ENCOUNTER — Inpatient Hospital Stay: Payer: Medicare Other

## 2019-10-15 DIAGNOSIS — I272 Pulmonary hypertension, unspecified: Secondary | ICD-10-CM

## 2019-10-15 LAB — BASIC METABOLIC PANEL
Anion gap: 8 (ref 5–15)
BUN: 25 mg/dL — ABNORMAL HIGH (ref 8–23)
CO2: 31 mmol/L (ref 22–32)
Calcium: 8.7 mg/dL — ABNORMAL LOW (ref 8.9–10.3)
Chloride: 98 mmol/L (ref 98–111)
Creatinine, Ser: 0.93 mg/dL (ref 0.61–1.24)
GFR calc Af Amer: >60 mL/min (ref >60)
GFR calc non Af Amer: >60 mL/min (ref >60)
Glucose, Bld: 101 mg/dL — ABNORMAL HIGH (ref 70–99)
Potassium: 3.4 mmol/L — ABNORMAL LOW (ref 3.5–5.1)
Sodium: 137 mmol/L (ref 135–145)

## 2019-10-15 LAB — CBC WITH DIFFERENTIAL/PLATELET
Abs Immature Granulocytes: 0.01 10*3/uL (ref 0.00–0.07)
Basophils Absolute: 0 10*3/uL (ref 0.0–0.1)
Basophils Relative: 1 %
Eosinophils Absolute: 0.1 10*3/uL (ref 0.0–0.5)
Eosinophils Relative: 3 %
HCT: 28.5 % — ABNORMAL LOW (ref 39.0–52.0)
Hemoglobin: 8.2 g/dL — ABNORMAL LOW (ref 13.0–17.0)
Immature Granulocytes: 0 %
Lymphocytes Relative: 32 %
Lymphs Abs: 1.1 10*3/uL (ref 0.7–4.0)
MCH: 25.1 pg — ABNORMAL LOW (ref 26.0–34.0)
MCHC: 28.8 g/dL — ABNORMAL LOW (ref 30.0–36.0)
MCV: 87.2 fL (ref 80.0–100.0)
Monocytes Absolute: 0.6 10*3/uL (ref 0.1–1.0)
Monocytes Relative: 17 %
Neutro Abs: 1.7 10*3/uL (ref 1.7–7.7)
Neutrophils Relative %: 47 %
Platelets: 123 10*3/uL — ABNORMAL LOW (ref 150–400)
RBC: 3.27 MIL/uL — ABNORMAL LOW (ref 4.22–5.81)
RDW: 21.4 % — ABNORMAL HIGH (ref 11.5–15.5)
WBC: 3.6 10*3/uL — ABNORMAL LOW (ref 4.0–10.5)
nRBC: 0 % (ref 0.0–0.2)

## 2019-10-15 LAB — PHOSPHORUS: Phosphorus: 2.5 mg/dL (ref 2.5–4.6)

## 2019-10-15 LAB — MAGNESIUM: Magnesium: 2.2 mg/dL (ref 1.7–2.4)

## 2019-10-15 MED ORDER — SODIUM CHLORIDE 0.9 % IV SOLN
3.0000 g | Freq: Four times a day (QID) | INTRAVENOUS | Status: DC
  Administered 2019-10-15 – 2019-10-17 (×8): 3 g via INTRAVENOUS
  Filled 2019-10-15: qty 8
  Filled 2019-10-15 (×8): qty 3
  Filled 2019-10-15 (×3): qty 8

## 2019-10-15 MED ORDER — PIPERACILLIN-TAZOBACTAM 3.375 G IVPB
3.3750 g | Freq: Three times a day (TID) | INTRAVENOUS | Status: DC
  Filled 2019-10-15 (×2): qty 50

## 2019-10-15 MED ORDER — POTASSIUM CHLORIDE 20 MEQ PO PACK
40.0000 meq | PACK | Freq: Once | ORAL | Status: AC
  Administered 2019-10-15: 12:00:00 40 meq via ORAL
  Filled 2019-10-15: qty 2

## 2019-10-15 MED ORDER — POTASSIUM CHLORIDE CRYS ER 20 MEQ PO TBCR: 40.0000 meq | EXTENDED_RELEASE_TABLET | Freq: Once | ORAL | Status: DC

## 2019-10-15 MED ORDER — MIDODRINE HCL 5 MG PO TABS
15.0000 mg | ORAL_TABLET | Freq: Three times a day (TID) | ORAL | Status: DC
  Administered 2019-10-15 – 2019-10-18 (×10): 15 mg via ORAL
  Filled 2019-10-15 (×12): qty 3

## 2019-10-15 MED ORDER — SENNOSIDES-DOCUSATE SODIUM 8.6-50 MG PO TABS
2.0000 | ORAL_TABLET | Freq: Two times a day (BID) | ORAL | Status: DC
  Administered 2019-10-15 – 2019-10-18 (×7): 2 via ORAL
  Filled 2019-10-15 (×7): qty 2

## 2019-10-15 MED ORDER — MODAFINIL 100 MG PO TABS
300.0000 mg | ORAL_TABLET | Freq: Two times a day (BID) | ORAL | Status: DC
  Administered 2019-10-15 – 2019-10-18 (×5): 300 mg via ORAL
  Filled 2019-10-15 (×6): qty 3

## 2019-10-15 NOTE — Progress Notes (Signed)
PROGRESS NOTE    Miguel Palmer  ZHG:[id number] DOB: [birth year] DOA: 10/11/2019 PCP: [name], MD (Confirm with patient/family/NH records and if not entered, this HAS to be entered at [facility] point of entry. "No PCP" if truly none.)   Brief Narrative: (Start on day 1 of progress note - keep it brief and live) Patient is a 79 year old male with history of type 2 diabetes, COPD, coronary artery disease, obesity, who is admitted to the hospital with anasarca and hypotension.  Chronically, patient was on night oxygen, and a as needed day oxygen. He has baseline short of breath with exertion. He walks with a walker, on a walk about 3 steps. He had infrequent paroxysmal active dyspnea, he has no orthopnea. He has a chronic leg edema, seems to be chronic in nature not getting worse. He denies any weight gain. Patient also states that he has chronic constipation, he took some laxative about a week ago. Since then, he has been having loose stools for the past week. 2-3loose stools a day, no rectal bleeding. In the emergency room, he was a found to have hypotension. He also had a significant leg edema. Chest x-ray has chronic changes without acute pulmonary edema. Procalcitonin 0.44, BNP 1906.   Assessment & Plan:   Active Problems:   CHF (congestive heart failure) (HCC)   Hypotension   Goals of care, counseling/discussion   DNR (do not resuscitate)   Malnutrition of moderate degree   Palliative care by specialist   #1.  Hypotension.  Patient currently off pressor.  Does not have any left ventricular dysfunction.  However, echocardiogram did show severe pulmonary hypertension.  Appreciate ID consult, patient does not have a sepsis to explain patient's low blood pressure.  Most likely cause is severe pulmonary hypertension combined with autonomic dysfunction.  Currently, patient is treated with midodrine and the Florinef.  Blood pressure still low.  I performed a literature search,  patient already on max dose of Florinef, but midodrine  can be increased to 17.5 mg 3 times a day as a maximum dose.  For now, I will increase midodrine to 15 mg 3 times a day.  We will continue pressure wraps to bilateral legs to help with stabilizing blood pressure.  #2.  Anasarca. Liver ultrasound which did not show liver cirrhosis with portal hypertension.  Condition is most likely due to right-sided heart failure in addition to liver cirrhosis and portal hypertension.  Diuretics cannot be started, will apply ACE wrap to bilateral lower extremity.   #3.  Iron deficient anemia. Continue iron supplement orally.  4.  Venous stasis with superficial wounds. Appreciate ID consult, no evidence of infection.  We will continue Unasyn for 2 days, discontinue tomorrow.  #5.  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 Not able to start diuretics due to hypotension.  #6.  Permanent atrial fibrillation. Continue anticoagulation.  7.  Type 2 diabetes.   Continue current regimen.  8.  Acute kidney injury. Renal function has recovered.  9.  Chronic hypoxemic restaurant failure. Continue oxygen treatment.  10.  Constipation. Patient initially had some loose stools before admission, currently has not had a bowel movement since admission.  We will start stool softener.  11.  Prognosis. Patient long-term prognosis is poor.  Palliative care was initially involved.  Patient and family has not accepted.  We will continue the conversation.    DVT prophylaxis: Eliquis Code Status: Full Family Communication: Discussed with patient together with the wife, all  questions answered. Disposition Plan:  . Patient came from:            . Anticipated d/c place: . Barriers to d/c OR conditions which need to be met to effect a safe d/c:   Consultants:   ID  Pulmonology  Procedures: None Antimicrobials: Unasyn  Subjective: Patient currently still has  significant low blood pressure, but he is asymptomatic.  No dizziness or lightheadedness. Still on oxygen, but no segment short of breath. Able to walk to the bedside commode with the help. Has not had a bowel movement since admission.  Objective: Vitals:   10/15/19 0530 10/15/19 0610 10/15/19 0800 10/15/19 0900  BP: (!) 116/92 (!) 86/73 90/60 (!) 83/59  Pulse: 89 99 97 93  Resp: (!) 27 (!) 23 (!) 22 15  Temp:   98.6 F (37 C)   TempSrc:   Oral   SpO2: (!) 78% 100% 97% (!) 71%  Weight:      Height:        Intake/Output Summary (Last 24 hours) at 10/15/2019 1152 Last data filed at 10/15/2019 0800 Gross per 24 hour  Intake 506.31 ml  Output 400 ml  Net 106.31 ml   Filed Weights   10/12/19 0350 10/14/19 0352 10/15/19 0500  Weight: 119.9 kg 123.4 kg 124.8 kg    Examination:  General exam: Appears calm and comfortable  Respiratory system: Crackles in the base to auscultation. Respiratory effort normal. Cardiovascular system: Irregularly irregular. No JVD, murmurs, rubs, gallops or clicks. 2-3+ pedal edema. Gastrointestinal system: Abdomen is nondistended, soft and nontender. No organomegaly or masses felt. Normal bowel sounds heard. Central nervous system: Alert and oriented x2. No focal neurological deficits. Extremities: Symmetric  Skin: No rashes, lesions or ulcers Psychiatry: Judgement and insight appear normal. Mood & affect appropriate.     Data Reviewed: I have personally reviewed following labs and imaging studies  CBC: Recent Labs  Lab 10/11/19 1249 10/12/19 0624 10/13/19 0521 10/14/19 0511 10/15/19 0329  WBC 6.9 7.6 5.7 5.3 3.6*  NEUTROABS 5.2 5.8 3.5 3.1 1.7  HGB 8.4* 7.7* 8.7* 9.0* 8.2*  HCT 28.4* 25.9* 28.8* 30.3* 28.5*  MCV 85.8 85.5 84.0 85.8 87.2  PLT 166 153 154 167 784*   Basic Metabolic Panel: Recent Labs  Lab 10/11/19 1249 10/12/19 0427 10/12/19 0624 10/13/19 0521 10/14/19 0511 10/15/19 0329  NA 131* 134*  --  140 137 137  K 4.6 4.1   --  3.8 3.9 3.4*  CL 93* 93*  --  100 97* 98  CO2 27 30  --  32 33* 31  GLUCOSE 95 107*  --  108* 110* 101*  BUN 47* 41*  --  31* 27* 25*  CREATININE 1.99* 1.73*  --  1.22 1.00 0.93  CALCIUM 8.6* 8.5*  --  8.6* 8.4* 8.7*  MG  --   --  2.5* 2.3 2.2 2.2  PHOS  --   --  3.5 3.2 2.8 2.5   GFR: Estimated Creatinine Clearance: 90.4 mL/min (by C-G formula based on SCr of 0.93 mg/dL). Liver Function Tests: Recent Labs  Lab 10/11/19 1249 10/11/19 1507  AST 40  --   ALT 25  --   ALKPHOS 101  --   BILITOT 2.4*  --   PROT 5.8*  --   ALBUMIN 2.8* 2.5*   Recent Labs  Lab 10/11/19 1249  LIPASE 30   No results for input(s): AMMONIA in the last 168 hours. Coagulation Profile: Recent Labs  Lab 10/11/19  1249  INR 1.7*   Cardiac Enzymes: No results for input(s): CKTOTAL, CKMB, CKMBINDEX, TROPONINI in the last 168 hours. BNP (last 3 results) No results for input(s): PROBNP in the last 8760 hours. HbA1C: No results for input(s): HGBA1C in the last 72 hours. CBG: Recent Labs  Lab 10/12/19 0345  GLUCAP 121*   Lipid Profile: No results for input(s): CHOL, HDL, LDLCALC, TRIG, CHOLHDL, LDLDIRECT in the last 72 hours. Thyroid Function Tests: No results for input(s): TSH, T4TOTAL, FREET4, T3FREE, THYROIDAB in the last 72 hours. Anemia Panel: Recent Labs    10/13/19 0521  VITAMINB12 629  TIBC 265  IRON 20*   Sepsis Labs: Recent Labs  Lab 10/11/19 1249 10/11/19 1507 10/12/19 0624 10/13/19 0521 10/14/19 0511  PROCALCITON  --   --  0.44 0.21 0.13  LATICACIDVEN 2.2* 1.6 1.9  --   --     Recent Results (from the past 240 hour(s))  Blood culture (single)     Status: None (Preliminary result)   Collection Time: 10/11/19 12:49 PM   Specimen: BLOOD  Result Value Ref Range Status   Specimen Description BLOOD BLOOD RIGHT WRIST  Final   Special Requests   Final    BOTTLES DRAWN AEROBIC AND ANAEROBIC Blood Culture adequate volume   Culture   Final    NO GROWTH 4 DAYS Performed  at [facility], [address], [place], [place] [address]    Report Status PENDING  Incomplete  Blood culture (single)     Status: None (Preliminary result)   Collection Time: 10/11/19  1:01 PM   Specimen: BLOOD  Result Value Ref Range Status   Specimen Description BLOOD RIGHT ANTECUBITAL  Final   Special Requests   Final    BOTTLES DRAWN AEROBIC AND ANAEROBIC Blood Culture results may not be optimal due to an excessive volume of blood received in culture bottles   Culture   Final    NO GROWTH 4 DAYS Performed at [facility], [address], [place], [place] [address]    Report Status PENDING  Incomplete  Respiratory Panel by RT PCR (Flu A&B, Covid) - Nasopharyngeal Swab     Status: None   Collection Time: 10/11/19  5:06 PM   Specimen: Nasopharyngeal Swab  Result Value Ref Range Status   SARS Coronavirus 2 by RT PCR NEGATIVE NEGATIVE Final    Comment: (NOTE) SARS-CoV-2 target nucleic acids are NOT DETECTED. The SARS-CoV-2 RNA is generally detectable in upper respiratoy specimens during the acute phase of infection. The lowest concentration of SARS-CoV-2 viral copies this assay can detect is 131 copies/mL. A negative result does not preclude SARS-Cov-2 infection and should not be used as the sole basis for treatment or other patient management decisions. A negative result may occur with  improper specimen collection/handling, submission of specimen other than nasopharyngeal swab, presence of viral mutation(s) within the areas targeted by this assay, and inadequate number of viral copies (<131 copies/mL). A negative result must be combined with clinical observations, patient history, and epidemiological information. The expected result is Negative. Fact Sheet for Patients:  PinkCheek.be Fact Sheet for Healthcare Providers:  GravelBags.it This test is not yet ap proved or cleared by the [place]  FDA and  has been authorized for detection and/or diagnosis of SARS-CoV-2 by FDA under an Emergency Use Authorization (EUA). This EUA will remain  in effect (meaning this test can be used) for the duration of the COVID-19 declaration under Section 564(b)(1) of the Act, 21  U.S.C. section 360bbb-3(b)(1), unless the authorization is terminated or revoked sooner.    Influenza A by PCR NEGATIVE NEGATIVE Final   Influenza B by PCR NEGATIVE NEGATIVE Final    Comment: (NOTE) The Xpert Xpress SARS-CoV-2/FLU/RSV assay is intended as an aid in  the diagnosis of influenza from Nasopharyngeal swab specimens and  should not be used as a sole basis for treatment. Nasal washings and  aspirates are unacceptable for Xpert Xpress SARS-CoV-2/FLU/RSV  testing. Fact Sheet for Patients: PinkCheek.be Fact Sheet for Healthcare Providers: GravelBags.it This test is not yet approved or cleared by the [place] FDA and  has been authorized for detection and/or diagnosis of SARS-CoV-2 by  FDA under an Emergency Use Authorization (EUA). This EUA will remain  in effect (meaning this test can be used) for the duration of the  Covid-19 declaration under Section 564(b)(1) of the Act, 21  U.S.C. section 360bbb-3(b)(1), unless the authorization is  terminated or revoked. Performed at [facility], [address] [place], [place] [address]   MRSA PCR Screening     Status: None   Collection Time: 10/12/19  3:49 AM   Specimen: Nasopharyngeal  Result Value Ref Range Status   MRSA by PCR NEGATIVE NEGATIVE Final    Comment:        The GeneXpert MRSA Assay (FDA approved for NASAL specimens only), is one component of a comprehensive MRSA colonization surveillance program. It is not intended to diagnose MRSA infection nor to guide or monitor treatment for MRSA infections. Performed at [facility], [address] [place], [place]  [address]   Aerobic/Anaerobic Culture (surgical/deep wound)     Status: None (Preliminary result)   Collection Time: 10/12/19  4:46 AM   Specimen: Leg; Wound  Result Value Ref Range Status   Specimen Description   Final    LEG RIGHT Performed at [facility], [address] [place], [place] [address]    Special Requests   Final    NONE Performed at [facility], [address] [place], [place] [address]    Gram Stain   Final    NO WBC SEEN FEW GRAM POSITIVE COCCI IN PAIRS Performed at [facility], [address], [place], [place] [address]    Culture   Final    ABUNDANT ACINETOBACTER SPECIES FEW CITROBACTER FREUNDII    Report Status PENDING  Incomplete   Organism ID, Bacteria ACINETOBACTER SPECIES  Final   Organism ID, Bacteria CITROBACTER FREUNDII  Final      Susceptibility   Acinetobacter species - MIC*    CEFTAZIDIME 4 SENSITIVE Sensitive     CEFTRIAXONE 8 SENSITIVE Sensitive     CIPROFLOXACIN <=0.25 SENSITIVE Sensitive     GENTAMICIN <=1 SENSITIVE Sensitive     IMIPENEM <=0.25 SENSITIVE Sensitive     PIP/TAZO <=4 SENSITIVE Sensitive     TRIMETH/SULFA <=20 SENSITIVE Sensitive     CEFEPIME 2 SENSITIVE Sensitive     AMPICILLIN/SULBACTAM <=2 SENSITIVE Sensitive     * ABUNDANT ACINETOBACTER SPECIES   Citrobacter freundii - MIC*    CEFAZOLIN >=64 RESISTANT Resistant     CEFEPIME <=1 SENSITIVE Sensitive     CEFTAZIDIME <=1 SENSITIVE Sensitive     CEFTRIAXONE <=1 SENSITIVE Sensitive     CIPROFLOXACIN <=0.25 SENSITIVE Sensitive     GENTAMICIN <=1 SENSITIVE Sensitive     IMIPENEM <=0.25 SENSITIVE Sensitive     TRIMETH/SULFA <=20 SENSITIVE Sensitive     PIP/TAZO <=4 SENSITIVE Sensitive     *  FEW CITROBACTER FREUNDII         Radiology Studies: US LIVER DOPPLER  Result Date: 10/15/2019 CLINICAL DATA:  History of NASH.  Evaluate hepatic vasculature. EXAM: DUPLEX ULTRASOUND OF LIVER TECHNIQUE: Color and duplex Doppler ultrasound was performed to  evaluate the hepatic in-flow and out-flow vessels. COMPARISON:  Right upper quadrant abdominal ultrasound-10/13/2019 FINDINGS: Liver: There is mild increased echogenicity of the hepatic parenchyma with mild nodularity hepatic contour. No discrete hepatic lesions. Note is made of a small amount of perihepatic ascites. No intrahepatic biliary duct dilatation. Main Portal Vein size: 0.9 cm Portal Vein Velocities (normal hepatopetal directional flow) Main Prox:  33 cm/sec Main Mid: 35 cm/sec Main Dist:  34 cm/sec Right: 17 cm/sec Left: 15 cm/sec Hepatic Vein Velocities (normal hepatofugal directional flow) Right:  27 cm/sec Middle:  34 cm/sec Left:  14 cm/sec IVC: Present and patent with normal respiratory phasicity. Hepatic Artery Velocity:  49 cm/sec Splenic Vein Velocity:  18 cm/sec Spleen: 8 cm x 5 cm x 10 cm with a total volume of 224 cm^3 (411 cm^3 is upper limit normal) Portal Vein Occlusion/Thrombus: No Splenic Vein Occlusion/Thrombus: No Ascites: Note is made of a small amount of intra-abdominal ascites. Varices: None IMPRESSION: 1. Patent hepatic vasculature with normal directional flow. 2. Increased echogenicity of the hepatic contour compatible with provided history of NASH. 3. Findings compatible with cirrhosis and portal venous hypertension with small amount of intra-abdominal ascites. Electronically Signed   By: Sandi Mariscal M.D.   On: 10/15/2019 10:48        Scheduled Meds: . apixaban  5 mg Oral BID  . Chlorhexidine Gluconate Cloth  6 each Topical Q0600  . collagenase  1 application Topical Daily  . dextroamphetamine  15 mg Oral TID  . feeding supplement (ENSURE ENLIVE)  237 mL Oral BID BM  . feeding supplement (PRO-STAT SUGAR FREE 64)  30 mL Oral TID  . fludrocortisone  0.1 mg Oral BID  . iron polysaccharides  150 mg Oral Daily  . midodrine  15 mg Oral TID WC  . modafinil  300 mg Oral BID  . multivitamin with minerals  1 tablet Oral Daily  . potassium chloride  40 mEq Oral Once  .  potassium chloride  40 mEq Oral Once  . sodium chloride flush  3 mL Intravenous Q12H  . tiotropium  18 mcg Inhalation QHS  . torsemide  20 mg Oral Daily   Continuous Infusions: . sodium chloride    . albumin human 12.5 g (10/15/19 1026)  . ampicillin-sulbactam (UNASYN) IV 3 g (10/15/19 0555)  . phenylephrine (NEO-SYNEPHRINE) Adult infusion 10 mcg/min (10/15/19 0525)     LOS: 4 days    Time spent: 35 mins    Sharen Hones, MD [facility]   To contact the attending provider between 7A-7P or the covering provider during after hours 7P-7A, please log into the web site [email or web address] and access using universal [place] password for that web site. If you do not have the password, please call the hospital operator.  10/15/2019, 11:52 AM

## 2019-10-15 NOTE — Plan of Care (Signed)
Continuing with plan of care. 

## 2019-10-16 LAB — BASIC METABOLIC PANEL
Anion gap: 8 (ref 5–15)
BUN: 24 mg/dL — ABNORMAL HIGH (ref 8–23)
CO2: 31 mmol/L (ref 22–32)
Calcium: 8.9 mg/dL (ref 8.9–10.3)
Chloride: 99 mmol/L (ref 98–111)
Creatinine, Ser: 1.04 mg/dL (ref 0.61–1.24)
GFR calc Af Amer: >60 mL/min (ref >60)
GFR calc non Af Amer: >60 mL/min (ref >60)
Glucose, Bld: 111 mg/dL — ABNORMAL HIGH (ref 70–99)
Potassium: 3.3 mmol/L — ABNORMAL LOW (ref 3.5–5.1)
Sodium: 138 mmol/L (ref 135–145)

## 2019-10-16 LAB — CULTURE, BLOOD (SINGLE)
Culture: NO GROWTH
Culture: NO GROWTH
Special Requests: ADEQUATE

## 2019-10-16 LAB — CBC WITH DIFFERENTIAL/PLATELET
Abs Immature Granulocytes: 0.01 10*3/uL (ref 0.00–0.07)
Basophils Absolute: 0 10*3/uL (ref 0.0–0.1)
Basophils Relative: 1 %
Eosinophils Absolute: 0.1 10*3/uL (ref 0.0–0.5)
Eosinophils Relative: 2 %
HCT: 28 % — ABNORMAL LOW (ref 39.0–52.0)
Hemoglobin: 8.3 g/dL — ABNORMAL LOW (ref 13.0–17.0)
Immature Granulocytes: 0 %
Lymphocytes Relative: 29 %
Lymphs Abs: 1 10*3/uL (ref 0.7–4.0)
MCH: 25.4 pg — ABNORMAL LOW (ref 26.0–34.0)
MCHC: 29.6 g/dL — ABNORMAL LOW (ref 30.0–36.0)
MCV: 85.6 fL (ref 80.0–100.0)
Monocytes Absolute: 0.6 10*3/uL (ref 0.1–1.0)
Monocytes Relative: 17 %
Neutro Abs: 1.8 10*3/uL (ref 1.7–7.7)
Neutrophils Relative %: 51 %
Platelets: 127 10*3/uL — ABNORMAL LOW (ref 150–400)
RBC: 3.27 MIL/uL — ABNORMAL LOW (ref 4.22–5.81)
RDW: 22.4 % — ABNORMAL HIGH (ref 11.5–15.5)
WBC: 3.5 10*3/uL — ABNORMAL LOW (ref 4.0–10.5)
nRBC: 0 % (ref 0.0–0.2)

## 2019-10-16 LAB — MAGNESIUM: Magnesium: 2.2 mg/dL (ref 1.7–2.4)

## 2019-10-16 LAB — PHOSPHORUS: Phosphorus: 2.4 mg/dL — ABNORMAL LOW (ref 2.5–4.6)

## 2019-10-16 MED ORDER — LACTULOSE 10 GM/15ML PO SOLN
20.0000 g | Freq: Once | ORAL | Status: AC
  Administered 2019-10-16: 13:00:00 20 g via ORAL
  Filled 2019-10-16: qty 30

## 2019-10-16 MED ORDER — POTASSIUM CHLORIDE 10 MEQ/100ML IV SOLN
10.0000 meq | INTRAVENOUS | Status: AC
  Administered 2019-10-16 (×2): 10 meq via INTRAVENOUS
  Filled 2019-10-16: qty 100

## 2019-10-16 NOTE — Plan of Care (Signed)
Continuing with plan of care. 

## 2019-10-16 NOTE — Progress Notes (Signed)
PROGRESS NOTE    Miguel Palmer  TML:[id number] DOB: 1940-09-07 DOA: 10/11/2019 PCP: [name], MD (Confirm with patient/family/NH records and if not entered, this HAS to be entered at [facility] point of entry. "No PCP" if truly none.)   Brief Narrative: (Start on day 1 of progress note - keep it brief and live) Patient is a 79 year old male with history of type 2 diabetes, COPD, coronary artery disease, obesity, who is admitted to the hospital with anasarca and hypotension.  Chronically, patient was on night oxygen, and a as needed day oxygen. He has baseline short of breath with exertion. He walks with a walker, on a walk about 3 steps. He had infrequent paroxysmal active dyspnea, he has no orthopnea. He has a chronic leg edema, seems to be chronic in nature not getting worse. He denies any weight gain. Patient also states that he has chronic constipation, he took some laxative about a week ago. Since then, he has been having loose stools for the past week. 2-3loose stools a day, no rectal bleeding. In the emergency room, he was a found to have hypotension. He also had a significant leg edema. Chest x-ray has chronic changes without acute pulmonary edema. Procalcitonin 0.44, BNP 1906. Patient required a pressor for few days, he was also treated with antibiotics for possible leg infection.   Assessment & Plan:   Active Problems:   CHF (congestive heart failure) (HCC)   Hypotension   Goals of care, counseling/discussion   DNR (do not resuscitate)   Malnutrition of moderate degree   Palliative care by specialist   #1.  Hypotension. Appears to be multifactorial in origin.  Patient has normal left ventricular ejection fraction.  But he has a severe pulmonary hypertension.  Condition is caused by pulmonary hypertension, autonomic dysfunction as well as possible leg infection.  Patient currently has been stabilized with the Florinef and midodrine.  He also has bilateral lower  extremity pressure wraps.  #2.  Anasarca. Liver ultrasound showed liver cirrhosis with portal hypertension.  Condition is probably combination of liver cirrhosis and a right-sided congestive heart failure secondary to pulmonary hypertension.  Has not been able to start diuretics due to low blood pressure.  Continue Ace wraps.  #3.  Iron deficient anemia. Continue supplements.  4.  Venous stasis with superficial wounds. Discussed with ID, not certain he had initially had a cellulitis.  He will be continued on Unasyn for today, can discontinue antibiotics on Monday.  #5.  Severe pulmonary hypertension with the right side congestive heart failure. Not able to diurese due to hypotension.  6.  Permanent atrial fibrillation. Continue current anticoagulation.  7.  Type 2 diabetes. No change in treatment regimen.  8.  Acute kidney injury. Appears to be secondary to hypotension.  Renal function has normalized.  9.  Chronic hypoxemic respiratory failure. Continue current oxygen.  10.  Constipation. Patient initially had diarrhea before admission.  Start senna yesterday, still no bowel movement.  We will give a dose of lactulose.  11.  Prognosis. Long-term prognosis is poor.  Initially evaluated by palliative care.  May continue the discussion on Monday with the family.  Patient still has significant weakness, I have a consulted social worker to look into SNF placement if patient is agreeable.   DVT prophylaxis: Eliquis Code Status: Full Family Communication: Discussed with patient together with the wife, all questions answered. Disposition Plan:   Patient came from:  Anticipated d/c place:  Barriers to d/c OR conditions which need to be met to effect a safe d/c:   Consultants:   ID  Pulmonology  Procedures: None Antimicrobials:  Unasyn  Subjective: Patient doing better.  Blood pressure still borderline, patient does not have any dizziness.  He has some constipation now, no abdominal pain or nausea vomiting. History of oxygen, denies any shortness of breath.  No cough. No fever or chills.  Objective: Vitals:   10/15/19 1438 10/15/19 2025 10/16/19 0515 10/16/19 0742  BP: (!) 82/61 99/63 (!) 85/52 97/72  Pulse: (!) 102 86 (!) 102 93  Resp:  20 20 16   Temp: 98 F (36.7 C) 97.7 F (36.5 C) 98.2 F (36.8 C) 98.1 F (36.7 C)  TempSrc: Oral Oral Oral Oral  SpO2: 100% 93% 93% 91%  Weight:      Height:        Intake/Output Summary (Last 24 hours) at 10/16/2019 1313 Last data filed at 10/16/2019 1210 Gross per 24 hour  Intake 520 ml  Output 280 ml  Net 240 ml   Filed Weights   10/12/19 0350 10/14/19 0352 10/15/19 0500  Weight: 119.9 kg 123.4 kg 124.8 kg    Examination:  General exam: Appears calm and comfortable  Respiratory system: Crackles in the base to auscultation. Respiratory effort normal. Cardiovascular system: Irregular. No JVD, murmurs, rubs, gallops or clicks. 3+ pedal edema. Gastrointestinal system: Abdomen is mildly distended, soft and nontender. No organomegaly or masses felt. Normal bowel sounds heard. Central nervous system: Alert and oriented. No focal neurological deficits. Extremities: Symmetric  Skin: No rashes, lesions or ulcers Psychiatry: Judgement and insight appear normal. Mood & affect appropriate.     Data Reviewed: I have personally reviewed following labs and imaging studies  CBC: Recent Labs  Lab 10/12/19 0624 10/13/19 0521 10/14/19 0511 10/15/19 0329 10/16/19 0538  WBC 7.6 5.7 5.3 3.6* 3.5*  NEUTROABS 5.8 3.5 3.1 1.7 1.8  HGB 7.7* 8.7* 9.0* 8.2* 8.3*  HCT 25.9* 28.8* 30.3* 28.5* 28.0*  MCV 85.5 84.0 85.8 87.2 85.6  PLT 153 154 167 123* 570*   Basic Metabolic Panel: Recent Labs  Lab 10/12/19 0427 10/12/19 0624 10/13/19 0521 10/14/19 0511 10/15/19 0329  10/16/19 0538  NA 134*  --  140 137 137 138  K 4.1  --  3.8 3.9 3.4* 3.3*  CL 93*  --  100 97* 98 99  CO2 30  --  32 33* 31 31  GLUCOSE 107*  --  108* 110* 101* 111*  BUN 41*  --  31* 27* 25* 24*  CREATININE 1.73*  --  1.22 1.00 0.93 1.04  CALCIUM 8.5*  --  8.6* 8.4* 8.7* 8.9  MG  --  2.5* 2.3 2.2 2.2 2.2  PHOS  --  3.5 3.2 2.8 2.5 2.4*   GFR: Estimated Creatinine Clearance: 80.8 mL/min (by C-G formula based on SCr of 1.04 mg/dL). Liver Function Tests: Recent Labs  Lab 10/11/19 1249 10/11/19 1507  AST 40  --   ALT 25  --   ALKPHOS 101  --   BILITOT 2.4*  --   PROT 5.8*  --   ALBUMIN 2.8* 2.5*   Recent Labs  Lab 10/11/19 1249  LIPASE 30   No results for input(s): AMMONIA in the last 168 hours. Coagulation Profile: Recent Labs  Lab 10/11/19 1249  INR 1.7*   Cardiac Enzymes: No results for input(s): CKTOTAL, CKMB, CKMBINDEX, TROPONINI in the last 168 hours. BNP (last 3  results) No results for input(s): PROBNP in the last 8760 hours. HbA1C: No results for input(s): HGBA1C in the last 72 hours. CBG: Recent Labs  Lab 10/12/19 0345  GLUCAP 121*   Lipid Profile: No results for input(s): CHOL, HDL, LDLCALC, TRIG, CHOLHDL, LDLDIRECT in the last 72 hours. Thyroid Function Tests: No results for input(s): TSH, T4TOTAL, FREET4, T3FREE, THYROIDAB in the last 72 hours. Anemia Panel: No results for input(s): VITAMINB12, FOLATE, FERRITIN, TIBC, IRON, RETICCTPCT in the last 72 hours. Sepsis Labs: Recent Labs  Lab 10/11/19 1249 10/11/19 1507 10/12/19 0624 10/13/19 0521 10/14/19 0511  PROCALCITON  --   --  0.44 0.21 0.13  LATICACIDVEN 2.2* 1.6 1.9  --   --     Recent Results (from the past 240 hour(s))  Blood culture (single)     Status: None   Collection Time: 10/11/19 12:49 PM   Specimen: BLOOD  Result Value Ref Range Status   Specimen Description BLOOD BLOOD RIGHT WRIST  Final   Special Requests   Final    BOTTLES DRAWN AEROBIC AND ANAEROBIC Blood Culture  adequate volume   Culture   Final    NO GROWTH 5 DAYS Performed at [facility], [address] [place], [place] [address]    Report Status 10/16/2019 FINAL  Final  Blood culture (single)     Status: None   Collection Time: 10/11/19  1:01 PM   Specimen: BLOOD  Result Value Ref Range Status   Specimen Description BLOOD RIGHT ANTECUBITAL  Final   Special Requests   Final    BOTTLES DRAWN AEROBIC AND ANAEROBIC Blood Culture results may not be optimal due to an excessive volume of blood received in culture bottles   Culture   Final    NO GROWTH 5 DAYS Performed at [facility], [address] [place], [place] [address]    Report Status 10/16/2019 FINAL  Final  Respiratory Panel by RT PCR (Flu A&B, Covid) - Nasopharyngeal Swab     Status: None   Collection Time: 10/11/19  5:06 PM   Specimen: Nasopharyngeal Swab  Result Value Ref Range Status   SARS Coronavirus 2 by RT PCR NEGATIVE NEGATIVE Final    Comment: (NOTE) SARS-CoV-2 target nucleic acids are NOT DETECTED. The SARS-CoV-2 RNA is generally detectable in upper respiratoy specimens during the acute phase of infection. The lowest concentration of SARS-CoV-2 viral copies this assay can detect is 131 copies/mL. A negative result does not preclude SARS-Cov-2 infection and should not be used as the sole basis for treatment or other patient management decisions. A negative result may occur with  improper specimen collection/handling, submission of specimen other than nasopharyngeal swab, presence of viral mutation(s) within the areas targeted by this assay, and inadequate number of viral copies (<131 copies/mL). A negative result must be combined with clinical observations, patient history, and epidemiological information. The expected result is Negative. Fact Sheet for Patients:  PinkCheek.be Fact Sheet for Healthcare Providers:  GravelBags.it This test  is not yet ap proved or cleared by the [place] FDA and  has been authorized for detection and/or diagnosis of SARS-CoV-2 by FDA under an Emergency Use Authorization (EUA). This EUA will remain  in effect (meaning this test can be used) for the duration of the COVID-19 declaration under Section 564(b)(1) of the Act, 21 U.S.C. section 360bbb-3(b)(1), unless the authorization is terminated or revoked sooner.    Influenza A by PCR NEGATIVE NEGATIVE Final   Influenza B by PCR NEGATIVE NEGATIVE  Final    Comment: (NOTE) The Xpert Xpress SARS-CoV-2/FLU/RSV assay is intended as an aid in  the diagnosis of influenza from Nasopharyngeal swab specimens and  should not be used as a sole basis for treatment. Nasal washings and  aspirates are unacceptable for Xpert Xpress SARS-CoV-2/FLU/RSV  testing. Fact Sheet for Patients: PinkCheek.be Fact Sheet for Healthcare Providers: GravelBags.it This test is not yet approved or cleared by the [place] FDA and  has been authorized for detection and/or diagnosis of SARS-CoV-2 by  FDA under an Emergency Use Authorization (EUA). This EUA will remain  in effect (meaning this test can be used) for the duration of the  Covid-19 declaration under Section 564(b)(1) of the Act, 21  U.S.C. section 360bbb-3(b)(1), unless the authorization is  terminated or revoked. Performed at [facility], [address] [place], [place] [address]   MRSA PCR Screening     Status: None   Collection Time: 10/12/19  3:49 AM   Specimen: Nasopharyngeal  Result Value Ref Range Status   MRSA by PCR NEGATIVE NEGATIVE Final    Comment:        The GeneXpert MRSA Assay (FDA approved for NASAL specimens only), is one component of a comprehensive MRSA colonization surveillance program. It is not intended to diagnose MRSA infection nor to guide or monitor treatment for MRSA infections. Performed at [facility], [address] [place], [place] [address]   Aerobic/Anaerobic Culture (surgical/deep wound)     Status: None (Preliminary result)   Collection Time: 10/12/19  4:46 AM   Specimen: Leg; Wound  Result Value Ref Range Status   Specimen Description   Final    LEG RIGHT Performed at [facility], [address] [place], [place] [address]    Special Requests   Final    NONE Performed at [facility], [address] [place], [place] [address]    Gram Stain   Final    NO WBC SEEN FEW GRAM POSITIVE COCCI IN PAIRS Performed at [facility], [place] [address] [place], [place] [address]    Culture   Final    ABUNDANT ACINETOBACTER SPECIES FEW CITROBACTER FREUNDII NO ANAEROBES ISOLATED; CULTURE IN PROGRESS FOR 5 DAYS    Report Status PENDING  Incomplete   Organism ID, Bacteria ACINETOBACTER SPECIES  Final   Organism ID, Bacteria CITROBACTER FREUNDII  Final      Susceptibility   Acinetobacter species - MIC*    CEFTAZIDIME 4 SENSITIVE Sensitive     CEFTRIAXONE 8 SENSITIVE Sensitive     CIPROFLOXACIN <=0.25 SENSITIVE Sensitive     GENTAMICIN <=1 SENSITIVE Sensitive     IMIPENEM <=0.25 SENSITIVE Sensitive     PIP/TAZO <=4 SENSITIVE Sensitive     TRIMETH/SULFA <=20 SENSITIVE Sensitive     CEFEPIME 2 SENSITIVE Sensitive     AMPICILLIN/SULBACTAM <=2 SENSITIVE Sensitive     * ABUNDANT ACINETOBACTER SPECIES   Citrobacter freundii - MIC*    CEFAZOLIN >=64 RESISTANT Resistant     CEFEPIME <=1 SENSITIVE Sensitive     CEFTAZIDIME <=1 SENSITIVE Sensitive     CEFTRIAXONE <=1 SENSITIVE Sensitive     CIPROFLOXACIN <=0.25 SENSITIVE Sensitive     GENTAMICIN <=1 SENSITIVE Sensitive     IMIPENEM <=0.25 SENSITIVE Sensitive     TRIMETH/SULFA <=20 SENSITIVE Sensitive     PIP/TAZO <=4 SENSITIVE Sensitive     * FEW CITROBACTER FREUNDII         Radiology Studies: US LIVER DOPPLER  Result Date: 10/15/2019 CLINICAL DATA:  History of NASH.  Evaluate hepatic  vasculature. EXAM: DUPLEX ULTRASOUND OF LIVER TECHNIQUE: Color and duplex Doppler ultrasound was performed to evaluate the hepatic in-flow and out-flow vessels. COMPARISON:  Right upper quadrant abdominal ultrasound-10/13/2019 FINDINGS: Liver: There is mild increased echogenicity of the hepatic parenchyma with mild nodularity hepatic contour. No discrete hepatic lesions. Note is made of a small amount of perihepatic ascites. No intrahepatic biliary duct dilatation. Main Portal Vein size: 0.9 cm Portal Vein Velocities (normal hepatopetal directional flow) Main Prox:  33 cm/sec Main Mid: 35 cm/sec Main Dist:  34 cm/sec Right: 17 cm/sec Left: 15 cm/sec Hepatic Vein Velocities (normal hepatofugal directional flow) Right:  27 cm/sec Middle:  34 cm/sec Left:  14 cm/sec IVC: Present and patent with normal respiratory phasicity. Hepatic Artery Velocity:  49 cm/sec Splenic Vein Velocity:  18 cm/sec Spleen: 8 cm x 5 cm x 10 cm with a total volume of 224 cm^3 (411 cm^3 is upper limit normal) Portal Vein Occlusion/Thrombus: No Splenic Vein Occlusion/Thrombus: No Ascites: Note is made of a small amount of intra-abdominal ascites. Varices: None IMPRESSION: 1. Patent hepatic vasculature with normal directional flow. 2. Increased echogenicity of the hepatic contour compatible with provided history of NASH. 3. Findings compatible with cirrhosis and portal venous hypertension with small amount of intra-abdominal ascites. Electronically Signed   By: Sandi Mariscal M.D.   On: 10/15/2019 10:48        Scheduled Meds: . apixaban  5 mg Oral BID  . Chlorhexidine Gluconate Cloth  6 each Topical Q0600  . collagenase  1 application Topical Daily  . dextroamphetamine  15 mg Oral TID  . feeding supplement (ENSURE ENLIVE)  237 mL Oral BID BM  . feeding supplement (PRO-STAT SUGAR FREE 64)  30 mL Oral TID  . fludrocortisone  0.1 mg Oral BID  . iron polysaccharides  150 mg Oral Daily  . lactulose  20 g Oral Once  . midodrine  15 mg  Oral TID WC  . modafinil  300 mg Oral BID  . multivitamin with minerals  1 tablet Oral Daily  . potassium chloride  40 mEq Oral Once  . senna-docusate  2 tablet Oral BID  . sodium chloride flush  3 mL Intravenous Q12H  . tiotropium  18 mcg Inhalation QHS  . torsemide  20 mg Oral Daily   Continuous Infusions: . sodium chloride    . albumin human 12.5 g (10/16/19 0948)  . ampicillin-sulbactam (UNASYN) IV 3 g (10/16/19 1205)  . phenylephrine (NEO-SYNEPHRINE) Adult infusion Stopped (10/15/19 0543)  . potassium chloride       LOS: 5 days    Time spent: 29    Sharen Hones, MD [facility]   To contact the attending provider between 7A-7P or the covering provider during after hours 7P-7A, please log into the web site [email or web address] and access using universal [place] password for that web site. If you do not have the password, please call the hospital operator.  10/16/2019, 1:13 PM

## 2019-10-17 DIAGNOSIS — I272 Pulmonary hypertension, unspecified: Secondary | ICD-10-CM | POA: Diagnosis present

## 2019-10-17 DIAGNOSIS — E440 Moderate protein-calorie malnutrition: Secondary | ICD-10-CM

## 2019-10-17 DIAGNOSIS — J9611 Chronic respiratory failure with hypoxia: Secondary | ICD-10-CM

## 2019-10-17 DIAGNOSIS — I890 Lymphedema, not elsewhere classified: Secondary | ICD-10-CM

## 2019-10-17 DIAGNOSIS — R531 Weakness: Secondary | ICD-10-CM

## 2019-10-17 DIAGNOSIS — E8809 Other disorders of plasma-protein metabolism, not elsewhere classified: Secondary | ICD-10-CM

## 2019-10-17 DIAGNOSIS — R601 Generalized edema: Secondary | ICD-10-CM | POA: Diagnosis present

## 2019-10-17 LAB — CBC WITH DIFFERENTIAL/PLATELET
Abs Immature Granulocytes: 0.03 10*3/uL (ref 0.00–0.07)
Basophils Absolute: 0 10*3/uL (ref 0.0–0.1)
Basophils Relative: 1 %
Eosinophils Absolute: 0.1 10*3/uL (ref 0.0–0.5)
Eosinophils Relative: 2 %
HCT: 27.8 % — ABNORMAL LOW (ref 39.0–52.0)
Hemoglobin: 8.2 g/dL — ABNORMAL LOW (ref 13.0–17.0)
Immature Granulocytes: 1 %
Lymphocytes Relative: 32 %
Lymphs Abs: 1.2 10*3/uL (ref 0.7–4.0)
MCH: 25.7 pg — ABNORMAL LOW (ref 26.0–34.0)
MCHC: 29.5 g/dL — ABNORMAL LOW (ref 30.0–36.0)
MCV: 87.1 fL (ref 80.0–100.0)
Monocytes Absolute: 0.6 10*3/uL (ref 0.1–1.0)
Monocytes Relative: 15 %
Neutro Abs: 1.9 10*3/uL (ref 1.7–7.7)
Neutrophils Relative %: 49 %
Platelets: 136 10*3/uL — ABNORMAL LOW (ref 150–400)
RBC: 3.19 MIL/uL — ABNORMAL LOW (ref 4.22–5.81)
RDW: 22 % — ABNORMAL HIGH (ref 11.5–15.5)
WBC: 3.8 10*3/uL — ABNORMAL LOW (ref 4.0–10.5)
nRBC: 0 % (ref 0.0–0.2)

## 2019-10-17 LAB — PHOSPHORUS: Phosphorus: 2.6 mg/dL (ref 2.5–4.6)

## 2019-10-17 LAB — MAGNESIUM: Magnesium: 2.1 mg/dL (ref 1.7–2.4)

## 2019-10-17 NOTE — Progress Notes (Signed)
PROGRESS NOTE    Miguel Palmer   ZOX:[id number]  DOB: 03/28/1941  PCP: [name], MD    DOA: 10/11/2019 LOS: 54   Brief Narrative   79 year old male with history of type 2 diabetes, COPD, coronary artery disease, obesity, who was admitted to the hospital on 4/40/21 with anasarca and hypotension.  Chronically, patient was on night oxygen, and a as needed day oxygen.  He has baseline short of breath with exertion.  He walks with a walker, only able to walk about 3 steps due to shortness of breath.  He had infrequent paroxysmal active dyspnea, he has no orthopnea.  He has a chronic leg edema, seems to be chronic in nature not getting worse.  He denies any weight gain. Patient also states that he has chronic constipation, he took some laxative about a week ago.  Since then, he has been having loose stools for the past week.  2-3 loose stools a day, no rectal bleeding. In the emergency room, he was a found to have hypotension.  He also had a significant leg edema.  Chest x-ray has chronic changes without acute pulmonary edema. Procalcitonin 0.44, BNP 1906.  Patient required a pressor for few days, he was also treated with antibiotics for possible lower extremity cellulitis.     Assessment & Plan   Active Problems:   Pulmonary hypertension (HCC)   Anasarca   Hypoalbuminemia   HLD (hyperlipidemia)   COPD (chronic obstructive pulmonary disease) (HCC)   Chronic respiratory failure with hypoxia (HCC)   Chronic pain syndrome   Hypotension   Goals of care, counseling/discussion   DNR (do not resuscitate)   Malnutrition of moderate degree   Palliative care by specialist   Lymphedema   Weakness generalized   1.  Hypotension. Appears to be multifactorial in origin.  Patient has normal left ventricular ejection fraction.  But he has a severe pulmonary hypertension.  Condition is caused by pulmonary hypertension, autonomic dysfunction as well as possible leg infection.  Patient  currently has been stabilized with the Florinef and midodrine.  He also has bilateral lower extremity pressure wraps.  2.  Anasarca. Liver ultrasound showed liver cirrhosis with portal hypertension.  Condition is probably combination of liver cirrhosis and a right-sided congestive heart failure secondary to pulmonary hypertension.  Has not been able to start diuretics due to low blood pressure.  Continue Ace wraps.  3.  Iron deficient anemia.  Continue iron supplement.  4.  Venous stasis with superficial wounds. Discussed with ID, not certain he had initially had a cellulitis.  Stop Unasyn.  5.  Severe pulmonary hypertension with the right side congestive heart failure. Not able to diurese due to hypotension.  6.  Permanent atrial fibrillation.  Continue current anticoagulation.  7.  Type 2 diabetes.  No change in treatment regimen.  8.  Acute kidney injury.  Appears to be secondary to hypotension.  Renal function has normalized.  Monitor BMP.  9.  Chronic hypoxemic respiratory failure. Continue current oxygen.  10.  Constipation - resolved with bowel regimen including Senna and lactulose. Patient initially had diarrhea before admission.    11. Goals of Care / Hospice status: Long-term prognosis is poor, life expectancy is likely under 6 months.  Palliative care was consulted.  Patient and his wife have decided to pursue hospice care, referral pending.   Patient BMI: Body mass index is 35.55 kg/m.   DVT prophylaxis: on Eliquis  Diet:  Diet Orders (From admission, onward)  Start     Ordered   10/15/19 1145  Diet 2 gram sodium Room service appropriate? Yes; Fluid consistency: Thin  Diet effective now    Question Answer Comment  Room service appropriate? Yes   Fluid consistency: Thin      10/15/19 1144            Code Status: DNR    Subjective 10/17/19    Patient see with wife at bedside this AM.  Reports he finally had a BM.  Denies other complaints  including fever/chills, chest pain, SOB, N/V.   Disposition Plan & Communication   Dispo & Barriers: Expect d/c home with hospice services, pending therapy evaluations Coming from: home Exp d/c date: 4/27-28 Medically stable for d/c? no  Family Communication: wife at bedside during encounter    Consults, Procedures, Significant Events   Consultants:   Infectious Disease  PCCM  General Surgery  Cardiology  Palliative Care  Nephrology    Objective   Vitals:   10/16/19 1401 10/16/19 2148 10/17/19 0404 10/17/19 0419  BP: 101/69 105/63 112/70   Pulse: 83 89 98   Resp: 16 20 20    Temp: 98 F (36.7 C) 98.6 F (37 C) 98.4 F (36.9 C)   TempSrc: Oral Oral Oral   SpO2: 100% 99% 97%   Weight:    125.6 kg  Height:        Intake/Output Summary (Last 24 hours) at 10/17/2019 1145 Last data filed at 10/17/2019 1001 Gross per 24 hour  Intake 1420.25 ml  Output 600 ml  Net 820.25 ml   Filed Weights   10/14/19 0352 10/15/19 0500 10/17/19 0419  Weight: 123.4 kg 124.8 kg 125.6 kg    Physical Exam:  General exam: awake, alert, no acute distress, obese HEENT: moist mucus membranes, hearing grossly normal  Respiratory system: decreased at bases otherwise clear, no wheezes, rales or rhonchi, normal respiratory effort. Cardiovascular system: normal S1/S2, irregular rhythm, regular rate.   Central nervous system: A&O x4. no gross focal neurologic deficits, normal speech Extremities: moves all, normal tone, b/l LE's with lymphedema wraps in place and small amount proximal edema Psychiatry: normal mood, congruent affect, intermittent mild confusion  Labs   Data Reviewed: I have personally reviewed following labs and imaging studies  CBC: Recent Labs  Lab 10/13/19 0521 10/14/19 0511 10/15/19 0329 10/16/19 0538 10/17/19 0716  WBC 5.7 5.3 3.6* 3.5* 3.8*  NEUTROABS 3.5 3.1 1.7 1.8 1.9  HGB 8.7* 9.0* 8.2* 8.3* 8.2*  HCT 28.8* 30.3* 28.5* 28.0* 27.8*  MCV 84.0 85.8  87.2 85.6 87.1  PLT 154 167 123* 127* 322*   Basic Metabolic Panel: Recent Labs  Lab 10/12/19 0427 10/12/19 0624 10/13/19 0521 10/14/19 0511 10/15/19 0329 10/16/19 0538 10/17/19 0716  NA 134*  --  140 137 137 138  --   K 4.1  --  3.8 3.9 3.4* 3.3*  --   CL 93*  --  100 97* 98 99  --   CO2 30  --  32 33* 31 31  --   GLUCOSE 107*  --  108* 110* 101* 111*  --   BUN 41*  --  31* 27* 25* 24*  --   CREATININE 1.73*  --  1.22 1.00 0.93 1.04  --   CALCIUM 8.5*  --  8.6* 8.4* 8.7* 8.9  --   MG  --    < > 2.3 2.2 2.2 2.2 2.1  PHOS  --    < > 3.2  2.8 2.5 2.4* 2.6   < > = values in this interval not displayed.   GFR: Estimated Creatinine Clearance: 81.1 mL/min (by C-G formula based on SCr of 1.04 mg/dL). Liver Function Tests: Recent Labs  Lab 10/11/19 1249 10/11/19 1507  AST 40  --   ALT 25  --   ALKPHOS 101  --   BILITOT 2.4*  --   PROT 5.8*  --   ALBUMIN 2.8* 2.5*   Recent Labs  Lab 10/11/19 1249  LIPASE 30   No results for input(s): AMMONIA in the last 168 hours. Coagulation Profile: Recent Labs  Lab 10/11/19 1249  INR 1.7*   Cardiac Enzymes: No results for input(s): CKTOTAL, CKMB, CKMBINDEX, TROPONINI in the last 168 hours. BNP (last 3 results) No results for input(s): PROBNP in the last 8760 hours. HbA1C: No results for input(s): HGBA1C in the last 72 hours. CBG: Recent Labs  Lab 10/12/19 0345  GLUCAP 121*   Lipid Profile: No results for input(s): CHOL, HDL, LDLCALC, TRIG, CHOLHDL, LDLDIRECT in the last 72 hours. Thyroid Function Tests: No results for input(s): TSH, T4TOTAL, FREET4, T3FREE, THYROIDAB in the last 72 hours. Anemia Panel: No results for input(s): VITAMINB12, FOLATE, FERRITIN, TIBC, IRON, RETICCTPCT in the last 72 hours. Sepsis Labs: Recent Labs  Lab 10/11/19 1249 10/11/19 1507 10/12/19 0624 10/13/19 0521 10/14/19 0511  PROCALCITON  --   --  0.44 0.21 0.13  LATICACIDVEN 2.2* 1.6 1.9  --   --     Recent Results (from the past 240  hour(s))  Blood culture (single)     Status: None   Collection Time: 10/11/19 12:49 PM   Specimen: BLOOD  Result Value Ref Range Status   Specimen Description BLOOD BLOOD RIGHT WRIST  Final   Special Requests   Final    BOTTLES DRAWN AEROBIC AND ANAEROBIC Blood Culture adequate volume   Culture   Final    NO GROWTH 5 DAYS Performed at [facility], [address] [place], [place] [address]    Report Status 10/16/2019 FINAL  Final  Blood culture (single)     Status: None   Collection Time: 10/11/19  1:01 PM   Specimen: BLOOD  Result Value Ref Range Status   Specimen Description BLOOD RIGHT ANTECUBITAL  Final   Special Requests   Final    BOTTLES DRAWN AEROBIC AND ANAEROBIC Blood Culture results may not be optimal due to an excessive volume of blood received in culture bottles   Culture   Final    NO GROWTH 5 DAYS Performed at [facility], [address] [place], [place] [address]    Report Status 10/16/2019 FINAL  Final  Respiratory Panel by RT PCR (Flu A&B, Covid) - Nasopharyngeal Swab     Status: None   Collection Time: 10/11/19  5:06 PM   Specimen: Nasopharyngeal Swab  Result Value Ref Range Status   SARS Coronavirus 2 by RT PCR NEGATIVE NEGATIVE Final    Comment: (NOTE) SARS-CoV-2 target nucleic acids are NOT DETECTED. The SARS-CoV-2 RNA is generally detectable in upper respiratoy specimens during the acute phase of infection. The lowest concentration of SARS-CoV-2 viral copies this assay can detect is 131 copies/mL. A negative result does not preclude SARS-Cov-2 infection and should not be used as the sole basis for treatment or other patient management decisions. A negative result may occur with  improper specimen collection/handling, submission of specimen other than nasopharyngeal swab, presence of viral mutation(s) within the areas targeted by this  assay, and inadequate number of viral copies (<131 copies/mL). A negative result must be combined  with clinical observations, patient history, and epidemiological information. The expected result is Negative. Fact Sheet for Patients:  PinkCheek.be Fact Sheet for Healthcare Providers:  GravelBags.it This test is not yet ap proved or cleared by the [place] FDA and  has been authorized for detection and/or diagnosis of SARS-CoV-2 by FDA under an Emergency Use Authorization (EUA). This EUA will remain  in effect (meaning this test can be used) for the duration of the COVID-19 declaration under Section 564(b)(1) of the Act, 21 U.S.C. section 360bbb-3(b)(1), unless the authorization is terminated or revoked sooner.    Influenza A by PCR NEGATIVE NEGATIVE Final   Influenza B by PCR NEGATIVE NEGATIVE Final    Comment: (NOTE) The Xpert Xpress SARS-CoV-2/FLU/RSV assay is intended as an aid in  the diagnosis of influenza from Nasopharyngeal swab specimens and  should not be used as a sole basis for treatment. Nasal washings and  aspirates are unacceptable for Xpert Xpress SARS-CoV-2/FLU/RSV  testing. Fact Sheet for Patients: PinkCheek.be Fact Sheet for Healthcare Providers: GravelBags.it This test is not yet approved or cleared by the [place] FDA and  has been authorized for detection and/or diagnosis of SARS-CoV-2 by  FDA under an Emergency Use Authorization (EUA). This EUA will remain  in effect (meaning this test can be used) for the duration of the  Covid-19 declaration under Section 564(b)(1) of the Act, 21  U.S.C. section 360bbb-3(b)(1), unless the authorization is  terminated or revoked. Performed at [facility], [address] [place], [place] [address]   MRSA PCR Screening     Status: None   Collection Time: 10/12/19  3:49 AM   Specimen: Nasopharyngeal  Result Value Ref Range Status   MRSA by PCR NEGATIVE NEGATIVE Final    Comment:         The GeneXpert MRSA Assay (FDA approved for NASAL specimens only), is one component of a comprehensive MRSA colonization surveillance program. It is not intended to diagnose MRSA infection nor to guide or monitor treatment for MRSA infections. Performed at [facility], [address] [place], [place] [address]   Aerobic/Anaerobic Culture (surgical/deep wound)     Status: None (Preliminary result)   Collection Time: 10/12/19  4:46 AM   Specimen: Leg; Wound  Result Value Ref Range Status   Specimen Description   Final    LEG RIGHT Performed at [facility], [address], [place], [place] [address]    Special Requests   Final    NONE Performed at [facility], [place],  [address]    Gram Stain NO WBC SEEN FEW GRAM POSITIVE COCCI IN PAIRS   Final   Culture   Final    ABUNDANT ACINETOBACTER SPECIES FEW CITROBACTER FREUNDII NO ANAEROBES ISOLATED CULTURE REINCUBATED FOR BETTER GROWTH Performed at [facility], [place] [address] [place], [place] [address]    Report Status PENDING  Incomplete   Organism ID, Bacteria ACINETOBACTER SPECIES  Final   Organism ID, Bacteria CITROBACTER FREUNDII  Final      Susceptibility   Acinetobacter species - MIC*    CEFTAZIDIME 4 SENSITIVE Sensitive     CEFTRIAXONE 8 SENSITIVE Sensitive     CIPROFLOXACIN <=0.25 SENSITIVE Sensitive     GENTAMICIN <=1 SENSITIVE Sensitive     IMIPENEM <=0.25 SENSITIVE Sensitive     PIP/TAZO <=4 SENSITIVE Sensitive     TRIMETH/SULFA <=  20 SENSITIVE Sensitive     CEFEPIME 2 SENSITIVE Sensitive     AMPICILLIN/SULBACTAM <=2 SENSITIVE Sensitive     * ABUNDANT ACINETOBACTER SPECIES   Citrobacter freundii - MIC*    CEFAZOLIN >=64 RESISTANT Resistant     CEFEPIME <=1 SENSITIVE Sensitive     CEFTAZIDIME <=1 SENSITIVE Sensitive     CEFTRIAXONE <=1 SENSITIVE Sensitive     CIPROFLOXACIN <=0.25 SENSITIVE Sensitive     GENTAMICIN <=1 SENSITIVE Sensitive      IMIPENEM <=0.25 SENSITIVE Sensitive     TRIMETH/SULFA <=20 SENSITIVE Sensitive     PIP/TAZO <=4 SENSITIVE Sensitive     * FEW CITROBACTER FREUNDII      Imaging Studies   No results found.   Medications   Scheduled Meds: . apixaban  5 mg Oral BID  . collagenase  1 application Topical Daily  . dextroamphetamine  15 mg Oral TID  . feeding supplement (ENSURE ENLIVE)  237 mL Oral BID BM  . feeding supplement (PRO-STAT SUGAR FREE 64)  30 mL Oral TID  . fludrocortisone  0.1 mg Oral BID  . iron polysaccharides  150 mg Oral Daily  . midodrine  15 mg Oral TID WC  . modafinil  300 mg Oral BID  . multivitamin with minerals  1 tablet Oral Daily  . potassium chloride  40 mEq Oral Once  . senna-docusate  2 tablet Oral BID  . sodium chloride flush  3 mL Intravenous Q12H  . tiotropium  18 mcg Inhalation QHS  . torsemide  20 mg Oral Daily   Continuous Infusions: . sodium chloride    . albumin human 12.5 g (10/17/19 0925)  . ampicillin-sulbactam (UNASYN) IV Stopped (10/17/19 0449)  . phenylephrine (NEO-SYNEPHRINE) Adult infusion Stopped (10/15/19 0543)       LOS: 6 days    Time spent: 30 minutes    Ezekiel Slocumb, DO [facility]   If 7PM-7AM, please contact night-coverage [email or web address] 10/17/2019, 11:45 AM

## 2019-10-17 NOTE — Progress Notes (Signed)
Physical Therapy Treatment Patient Details Name: Miguel Palmer MRN: [id number] DOB: 07-Jun-1941 Today's Date: 10/17/2019    History of Present Illness Miguel Palmer  is a 79 y.o. male with a known history of history of diabetes, COPD, CAD obesity who presents from [facility] with hypotension and concern of sepsis.  It was also reported the pt had multiple RLE wounds and worsening bilateral lower extremity edema secondary to chronic lymphedema. He reports he has been wrapping his right leg in trash bags to collect the fluid.  Pt also endorses having diarrhea x1 week after taking laxatives. Pt was transferred to step-down due to worsening hypotension.    PT Comments    Pt ready for session on second attempt.  Attends donned per his request due to urinary inc.  Rolling left/right with rails and min a x 1.  To EOB with min a x +2.  Sitting steady.  Stood with min a x 2 and is able to sidestep with RW along bed before returning to sitting and min a x 2 to supine.  Gait and further standing limited to low BP with drops with sitting and standing.    While pt is generally steady with standing and sidestepping, further OOB limited by BP's - see flow sheets.  While wife is beginning to grasp limits in mobility further education is required.  She stated he primarily uses a rollator walker seat (which is a standard size and not bariatric which would be more appropriate) or a piano bench for mobility at home.  She stated they have several more on order from [employer].  They has a wheelchair at home which would be the safest mode of mobility in the home and they were encouraged to use it but both seemed somewhat resistant to it stating what they do at home works.  When asking about commode at home she stated he did not need a bedside as at home he is able to walk to the bathroom.  They stated they have all equipment needed at home but may benefit from further education regarding safety and realistic  expectations at discharge in regards to mobility and progression of illness.   Follow Up Recommendations  Home health PT;Supervision for mobility/OOB;Supervision - Intermittent     Equipment Recommendations  3in1 (PT);Other (comment)    Recommendations for Other Services       Precautions / Restrictions Precautions Precautions: Fall Restrictions Weight Bearing Restrictions: No    Mobility  Bed Mobility Overal bed mobility: Needs Assistance Bed Mobility: Supine to Sit;Sit to Supine     Supine to sit: Min assist;+2 for physical assistance Sit to supine: Min assist;+2 for physical assistance      Transfers Overall transfer level: Needs assistance Equipment used: Rolling walker (2 wheeled) Transfers: Sit to/from Stand Sit to Stand: Min assist;+2 physical assistance         General transfer comment: +2 for safety due to BP  Ambulation/Gait Ambulation/Gait assistance: Min assist;+2 physical assistance Gait Distance (Feet): 3 Feet Assistive device: Rolling walker (2 wheeled) Gait Pattern/deviations: Step-to pattern Gait velocity: decreased   General Gait Details: sidestepped to move up in bed   Stairs             Wheelchair Mobility    Modified Rankin (Stroke Patients Only)       Balance Overall balance assessment: Needs assistance;History of Falls Sitting-balance support: No upper extremity supported;Feet supported Sitting balance-Leahy Scale: Fair     Standing balance support: Bilateral upper  extremity supported Standing balance-Leahy Scale: Fair                              Cognition Arousal/Alertness: Awake/alert Behavior During Therapy: WFL for tasks assessed/performed Overall Cognitive Status: Difficult to assess                                 General Comments: wife corrects pt at times but pt is very talkative and joking with Probation officer so it is difficult to judge cognition      Exercises Other  Exercises Other Exercises: BLE ankle pumps, heel slides, ab/add and SLR AAROM x 10    General Comments        Pertinent Vitals/Pain Pain Assessment: Faces Faces Pain Scale: Hurts a little bit Pain Location: back Pain Descriptors / Indicators: Aching;Sore Pain Intervention(s): Monitored during session;Repositioned    Home Living                      Prior Function            PT Goals (current goals can now be found in the care plan section) Progress towards PT goals: Progressing toward goals    Frequency    Min 2X/week      PT Plan Current plan remains appropriate    Co-evaluation              AM-PAC PT "6 Clicks" Mobility   Outcome Measure  Help needed turning from your back to your side while in a flat bed without using bedrails?: None Help needed moving from lying on your back to sitting on the side of a flat bed without using bedrails?: A Little Help needed moving to and from a bed to a chair (including a wheelchair)?: A Little Help needed standing up from a chair using your arms (e.g., wheelchair or bedside chair)?: A Little Help needed to walk in hospital room?: A Lot Help needed climbing 3-5 steps with a railing? : Total 6 Click Score: 16    End of Session Equipment Utilized During Treatment: Oxygen Activity Tolerance: Patient tolerated treatment well;Treatment limited secondary to medical complications (Comment) Patient left: in bed;with family/visitor present;with call bell/phone within reach;with bed alarm set Nurse Communication: Mobility status;Other (comment)(orthostatic BP's)       Time: 4944-9675 PT Time Calculation (min) (ACUTE ONLY): 33 min  Charges:  $Therapeutic Exercise: 8-22 mins $Therapeutic Activity: 8-22 mins                    Chesley Noon, PTA 10/17/19, 2:23 PM

## 2019-10-17 NOTE — Care Management Important Message (Signed)
Important Message  Patient Details  Name: Miguel Palmer MRN: [id number] Date of Birth: 04-10-1941   Medicare Important Message Given:  Yes     Dannette Barbara 10/17/2019, 12:15 PM

## 2019-10-17 NOTE — Progress Notes (Signed)
[place] Room [facility]) Hospital Liaison RN note  Notified by Isaias Cowman, RN with [facility] team of patient/family request for [facility] services at home after discharge. Chart and patient information under review by [facility] physician. Hospice eligibility pending at this time.  Spoke with patient and wife, Miguel Palmer, at bedside to initiate education related to hospice philosophy, services and team approach to care. Miguel Palmer verbalized understanding of information given. Per discussion, plan is for discharge to home by private vehicle.  DME needs discussed and request has been made for wheelchair and 3 in 1 (X 2). Patient has O2 in the home and requests nasal cannulas and tubing for delivery. The address is not correct in the chart. Per Miguel Palmer, the address is: [address], [place], [place], [place] [address]. Miguel Palmer [phone] cell or [phone] home)  is the contact for delivery.  Please send signed and completed DNR home with patient at discharge.  Pt will need prescriptions for discharge comfort medications.  [facility] Referral Center aware of the above. Above information shared with [facility] with TOC.  Please call with any hospice related questions or concerns.  Thank you, Margaretmary Eddy, BSN, RN [facility] Liaison [phone]

## 2019-10-17 NOTE — TOC Progression Note (Signed)
Transition of Care [facility]) - Progression Note    Patient Details  Name: Miguel Palmer MRN: [id number] Date of Birth: 11-28-1940  Transition of Care [facility]) CM/SW Contact  Beverly Sessions, RN Phone Number: 10/17/2019, 2:50 PM  Clinical Narrative:    Notified by Palliative that patient to discharge home with hospice.  RNCM confirmed with patient and wife, and they select [facility]  Referral made to [facility] with [employer]   Wife requests WC and [facility] for discharge.  She also inquires about and electric scooter.  I have notified [name]    Expected Discharge Plan: Home w Hospice Care Barriers to Discharge: Continued Medical Work up  Expected Discharge Plan and Services Expected Discharge Plan: [place]   Discharge Planning Services: CM Consult   Living arrangements for the past 2 months: Apartment                                       Social Determinants of Health (SDOH) Interventions    Readmission Risk Interventions No flowsheet data found.

## 2019-10-17 NOTE — Progress Notes (Addendum)
Patient ID: Miguel Palmer, male   DOB: 10/12/1940, 79 y.o.   MRN: [id number]  This NP visited patient at the bedside as a follow up for palliative medicine needs and emotional support.    Patient admitted on 10/17/2019 with generalized weakness and gait disturbance.  Patient has multiple comorbidities including heart disease, COPD, diabetes, hypoventilation syndrome, pulmonary fibrosis, and idiopathic hypotension.  Patient lives at home with his wife.  He has had continued physical functional and cognitive slow decline over the past many months.  Today is day 5 of this hospitalization.. Patient is intermittently confused.  I met at the bedside with patient and his wife and offered education regarding current medical situation; diagnosis, prognosis, GOCs, EOL wishes disposition, anticipatory care needs. Wife verbalizes an understanding of the seriousness of her husband's current medical situation and the long-term poor prognosis.  Wife verbalizes that she does not want transition of care to skilled nursing facility and her hope is for him to return home with her.  Educuation offered and we discussed hospice benefit and eligibility.  Wife is open to hospice benefit.  Natural trajectory and expectations as the body continues to fail to thrive was offered.  We discussed the limitations of medical interventions to prolong quality of life when the body does begin to fail to thrive.  Plan of care -DNR/DNI -No artificial feeding now or in the future -When patient is medically stable for discharge, family is open to hospice services at that time.  They are requesting hospice of [facility]    Prognosis is likely less than 6 months -Avoid rehospitalization  MOST form completed and Hard Choices left for review.  Discussed with family/patient  the importance of continued conversation withmedical providers regarding overall plan of care and treatment options,  ensuring decisions are within the context of  the patients values and GOCs.  Questions and concerns addressed   Discussed with Dr Arbutus Ped  Total time spent on the unit was 35 minutes   Greater than 50% of the time was spent in counseling and coordination of care  Wadie Lessen NP  Palliative Medicine Team Team Phone # [phone] Pager [phone]

## 2019-10-17 NOTE — Hospital Course (Signed)
79 year old male with history of type 2 diabetes, COPD, coronary artery disease, obesity, who was admitted to the hospital on 4/40/21 with anasarca and hypotension.  Chronically, patient was on night oxygen, and a as needed day oxygen.  He has baseline short of breath with exertion.  He walks with a walker, only able to walk about 3 steps due to shortness of breath.  He had infrequent paroxysmal active dyspnea, he has no orthopnea.  He has a chronic leg edema, seems to be chronic in nature not getting worse.  He denies any weight gain. Patient also states that he has chronic constipation, he took some laxative about a week ago.  Since then, he has been having loose stools for the past week.  2-3 loose stools a day, no rectal bleeding. In the emergency room, he was a found to have hypotension.  He also had a significant leg edema.  Chest x-ray has chronic changes without acute pulmonary edema. Procalcitonin 0.44, BNP 1906.  Patient required a pressor for few days, he was also treated with antibiotics for possible lower extremity cellulitis.

## 2019-10-18 LAB — CBC WITH DIFFERENTIAL/PLATELET
Abs Immature Granulocytes: 0.02 10*3/uL (ref 0.00–0.07)
Basophils Absolute: 0.1 10*3/uL (ref 0.0–0.1)
Basophils Relative: 1 %
Eosinophils Absolute: 0.1 10*3/uL (ref 0.0–0.5)
Eosinophils Relative: 1 %
HCT: 27.8 % — ABNORMAL LOW (ref 39.0–52.0)
Hemoglobin: 8.3 g/dL — ABNORMAL LOW (ref 13.0–17.0)
Immature Granulocytes: 0 %
Lymphocytes Relative: 28 %
Lymphs Abs: 1.3 10*3/uL (ref 0.7–4.0)
MCH: 25.6 pg — ABNORMAL LOW (ref 26.0–34.0)
MCHC: 29.9 g/dL — ABNORMAL LOW (ref 30.0–36.0)
MCV: 85.8 fL (ref 80.0–100.0)
Monocytes Absolute: 0.7 10*3/uL (ref 0.1–1.0)
Monocytes Relative: 16 %
Neutro Abs: 2.4 10*3/uL (ref 1.7–7.7)
Neutrophils Relative %: 54 %
Platelets: 123 10*3/uL — ABNORMAL LOW (ref 150–400)
RBC: 3.24 MIL/uL — ABNORMAL LOW (ref 4.22–5.81)
RDW: 22.4 % — ABNORMAL HIGH (ref 11.5–15.5)
Smear Review: NORMAL
WBC: 4.5 10*3/uL (ref 4.0–10.5)
nRBC: 0 % (ref 0.0–0.2)

## 2019-10-18 LAB — BASIC METABOLIC PANEL
Anion gap: 10 (ref 5–15)
BUN: 18 mg/dL (ref 8–23)
CO2: 32 mmol/L (ref 22–32)
Calcium: 9.1 mg/dL (ref 8.9–10.3)
Chloride: 100 mmol/L (ref 98–111)
Creatinine, Ser: 1.08 mg/dL (ref 0.61–1.24)
GFR calc Af Amer: >60 mL/min (ref >60)
GFR calc non Af Amer: >60 mL/min (ref >60)
Glucose, Bld: 93 mg/dL (ref 70–99)
Potassium: 2.8 mmol/L — ABNORMAL LOW (ref 3.5–5.1)
Sodium: 142 mmol/L (ref 135–145)

## 2019-10-18 LAB — POTASSIUM: Potassium: 3.4 mmol/L — ABNORMAL LOW (ref 3.5–5.1)

## 2019-10-18 LAB — MAGNESIUM: Magnesium: 2.2 mg/dL (ref 1.7–2.4)

## 2019-10-18 LAB — PHOSPHORUS: Phosphorus: 2.7 mg/dL (ref 2.5–4.6)

## 2019-10-18 MED ORDER — FLUDROCORTISONE ACETATE 0.1 MG PO TABS: 0.1000 mg | ORAL_TABLET | Freq: Two times a day (BID) | ORAL | 1 refills | Status: DC

## 2019-10-18 MED ORDER — MIDODRINE HCL 5 MG PO TABS: 15.0000 mg | ORAL_TABLET | Freq: Three times a day (TID) | ORAL | 1 refills | Status: DC

## 2019-10-18 MED ORDER — PRO-STAT SUGAR FREE PO LIQD: 30.0000 mL | Freq: Three times a day (TID) | ORAL | 0 refills | Status: DC

## 2019-10-18 MED ORDER — COLLAGENASE 250 UNIT/GM EX OINT: 1.0000 "application " | TOPICAL_OINTMENT | Freq: Every day | CUTANEOUS | 0 refills | Status: DC

## 2019-10-18 MED ORDER — POTASSIUM CHLORIDE 20 MEQ PO PACK
40.0000 meq | PACK | Freq: Once | ORAL | Status: AC
  Administered 2019-10-18: 40 meq via ORAL
  Filled 2019-10-18: qty 2

## 2019-10-18 MED ORDER — TORSEMIDE 20 MG PO TABS: 20.0000 mg | ORAL_TABLET | Freq: Every day | ORAL | 1 refills | Status: DC

## 2019-10-18 MED ORDER — ENSURE ENLIVE PO LIQD: 237.0000 mL | Freq: Two times a day (BID) | ORAL | 12 refills | Status: DC

## 2019-10-18 MED ORDER — LORAZEPAM 2 MG/ML PO CONC: 1.0000 mg | Freq: Four times a day (QID) | ORAL | 0 refills | Status: DC | PRN

## 2019-10-18 MED ORDER — MORPHINE SULFATE 20 MG/5ML PO SOLN: 5.0000 mg | ORAL | 0 refills | Status: DC | PRN

## 2019-10-18 NOTE — TOC Transition Note (Signed)
Transition of Care [facility]) - CM/SW Discharge Note   Patient Details  Name: Miguel Palmer MRN: [id number] Date of Birth: 1940-08-17  Transition of Care [facility] CM/SW Contact:  Beverly Sessions, RN Phone Number: 10/18/2019, 1:08 PM   Clinical Narrative:     Patient to discharge home with hospice today [name] with [employer] notified  Patient and family have decided to use EMS transport home  EMS packet and DNR on chart.  EMS called   Final next level of care: Home w Hospice Care Barriers to Discharge: No Barriers Identified   Patient Goals and CMS Choice        Discharge Placement                  Name of family member notified: Wife Patient and family notified of of transfer: 10/18/19  Discharge Plan and Services   Discharge Planning Services: CM Consult                                 Social Determinants of Health ([place]) Interventions     Readmission Risk Interventions No flowsheet data found.

## 2019-10-18 NOTE — Discharge Summary (Signed)
Physician Discharge Summary  [name] XLK:[id number] DOB: 1940/10/19 DOA: 10/11/2019  PCP: [name], MD  Admit date: 10/11/2019 Discharge date: 10/18/2019  Admitted From: home Disposition:  Home with hospice  Recommendations for Outpatient Follow-up:  1. Home Hospice  Home Health: No  Equipment/Devices: wheelchair, 3-n-1   Discharge Condition: Stable  CODE STATUS: DNR/DNI  Diet recommendation: Low sodium 2g/day maximum  Discharge Diagnoses: Active Problems:   Pulmonary hypertension (HCC)   Anasarca   Hypoalbuminemia   HLD (hyperlipidemia)   COPD (chronic obstructive pulmonary disease) (HCC)   Chronic respiratory failure with hypoxia (HCC)   Chronic pain syndrome   Hypotension   Goals of care, counseling/discussion   DNR (do not resuscitate)   Malnutrition of moderate degree   Palliative care by specialist   Lymphedema   Weakness generalized    Summary of HPI and Hospital Course:  79 year old male with history of type 2 diabetes, COPD, coronary artery disease, obesity, who was admitted to the hospital on 4/40/21 with anasarca and hypotension.  Chronically, patient was on night oxygen, and a as needed day oxygen.  He has baseline short of breath with exertion.  He walks with a walker, only able to walk about 3 steps due to shortness of breath.  He had infrequent paroxysmal active dyspnea, he has no orthopnea.  He has a chronic leg edema, seems to be chronic in nature not getting worse.  He denies any weight gain. Patient also states that he has chronic constipation, he took some laxative about a week ago.  Since then, he has been having loose stools for the past week.  2-3 loose stools a day, no rectal bleeding. In the emergency room, he was a found to have hypotension.  He also had a significant leg edema.  Chest x-ray has chronic changes without acute pulmonary edema. Procalcitonin 0.44, BNP 1906.  Patient required a pressor for few days, he was also treated  with antibiotics for possible lower extremity cellulitis.   Hospice status: Long-term prognosis is poor, life expectancy is likely under 6 months. Palliative care was consulted.  Patient and his wife decided to pursue hospice care, and home hospice services have been arranged.  Comfort medication prescriptions provided.  Medications for chronic conditions as below to be changed/discontinued by hospice.  Hypotension Appears to be multifactorial in origin. Patient has normal left ventricular ejection fraction. But he has a severe pulmonary hypertension. Condition is caused by pulmonary hypertension, autonomic dysfunction as well as possible leg infection. Stabilized with Florinef and midodrine. He also has bilateral lower extremity pressure wraps.  Anasarca Liver ultrasound showed liver cirrhosis with portal hypertension. Condition is probably combination of liver cirrhosis and a right-sided congestive heart failure secondary to pulmonary hypertension. Has not been able to start diuretics due to low blood pressure. Continue Ace wraps.  Iron deficient anemia  Continue iron supplement.  Venous stasis with superficial wounds Discussed with ID, not certain he had initially had a cellulitis. Stop Unasyn.  Severe pulmonary hypertension with right-sided congestive heart failure. Not able to diurese due to hypotension.  Permanent atrial fibrillation.  Continue Eliquis.  Type 2 diabetes.  Not on medications and glucose controlled.  Acute kidney injury Appears to be secondary to hypotension. Renal function has normalized.  Monitor BMP.  Chronic hypoxemic respiratory failure - Continue supplemental O2, maintain O2 sat > 90%.  Constipation - resolved with bowel regimen including Senna and lactulose. Patient initially had diarrhea before admission.    Discharge Instructions  Discharge Instructions    Call MD for:  severe uncontrolled pain   Complete by: As directed     Diet - low sodium heart healthy   Complete by: As directed    Increase activity slowly   Complete by: As directed      Allergies as of 10/18/2019      Reactions   Bee Pollen Shortness Of Breath   Post nasal drip   Pollen Extract Shortness Of Breath   Post nasal drip Post nasal drip   Codeine Other (See Comments)   Morphine Itching   (+) itching and redness noted to IV insertion site following administration. See ED nurses note from 11/25 at 0449. (+) itching and redness noted to IV insertion site following administration. See ED nurses note from 11/25 at 0449.   Morphine And Related Itching   (+) itching and redness noted to IV insertion site following administration. See ED nurses note from 11/25 at 0449.      Medication List    STOP taking these medications   ascorbic acid 500 MG tablet Commonly known as: VITAMIN C   Coenzyme Q10 100 MG Tabs   DHA-PHOSPHATIDYLSERINE PO   Ginkgo Biloba 60 MG Tabs   metoprolol succinate 50 MG 24 hr tablet Commonly known as: TOPROL-XL   rosuvastatin 20 MG tablet Commonly known as: CRESTOR   Vitamin D3 50 MCG (2000 UT) capsule     TAKE these medications   acetaminophen 325 MG tablet Commonly known as: TYLENOL Take 2 tablets (650 mg total) by mouth every 6 (six) hours as needed for mild pain (or Fever >/= 101).   chlorhexidine 0.12 % solution Commonly known as: PERIDEX Use as directed 15 mLs in the mouth or throat 2 (two) times daily. After breakfast and before bedtime   collagenase ointment Commonly known as: SANTYL Apply 1 application topically daily.   dextroamphetamine 15 MG 24 hr capsule Commonly known as: DEXEDRINE SPANSULE Take 1 capsule by mouth 3 (three) times daily.   Eliquis 5 MG Tabs tablet Generic drug: apixaban Take 5 mg by mouth 2 (two) times daily.   feeding supplement (ENSURE ENLIVE) Liqd Take 237 mLs by mouth 2 (two) times daily between meals.   feeding supplement (PRO-STAT SUGAR FREE 64) Liqd Take 30  mLs by mouth 3 (three) times daily.   fludrocortisone 0.1 MG tablet Commonly known as: FLORINEF Take 1 tablet (0.1 mg total) by mouth 2 (two) times daily.   gabapentin 400 MG capsule Commonly known as: NEURONTIN Take 400 mg by mouth 4 (four) times daily.   Glucosamine Sulfate 1000 MG Caps Take 1,000 mg by mouth 2 (two) times daily.   guaiFENesin 600 MG 12 hr tablet Commonly known as: MUCINEX Take 600 mg by mouth 2 (two) times daily as needed.   LORazepam 2 MG/ML concentrated solution Commonly known as: ATIVAN Take 0.5 mLs (1 mg total) by mouth every 6 (six) hours as needed (anxiety or agitation).   midodrine 5 MG tablet Commonly known as: PROAMATINE Take 3 tablets (15 mg total) by mouth 3 (three) times daily with meals.   modafinil 100 MG tablet Commonly known as: PROVIGIL Take 300 mg by mouth daily.   morphine 20 MG/5ML solution Take 1.3 mLs (5.2 mg total) by mouth every 2 (two) hours as needed for pain (or air hunger).   MULTIVITAMIN & MINERAL PO Take by mouth.   nitroGLYCERIN 0.4 MG SL tablet Commonly known as: NITROSTAT Place 0.4 mg under the tongue every 5 (  five) minutes as needed for chest pain.   PHOSPHATIDYLSERINE PO Take 60 mg by mouth 3 (three) times daily.   tiotropium 18 MCG inhalation capsule Commonly known as: SPIRIVA Place 1 capsule (18 mcg total) into inhaler and inhale daily.   torsemide 20 MG tablet Commonly known as: DEMADEX Take 1 tablet (20 mg total) by mouth daily. Start taking on: October 19, 2019 What changed:   how much to take  how to take this  when to take this  additional instructions            Durable Medical Equipment  (From admission, onward)         Start     Ordered   10/18/19 1045  For home use only DME 3 n 1  Once     10/18/19 1044   10/18/19 1044  For home use only DME standard manual wheelchair with seat cushion  Once    Comments: Patient suffers from generalized weakness which impairs their ability to  perform daily activities like bathing, dressing, feeding and grooming in the home.  A cane, crutch or walker will not resolve issue with performing activities of daily living. A wheelchair will allow patient to safely perform daily activities. Patient can safely propel the wheelchair in the home or has a caregiver who can provide assistance. Length of need Lifetime. Accessories: elevating leg rests (ELRs), wheel locks, extensions and anti-tippers.   10/18/19 1044          Allergies  Allergen Reactions  . Bee Pollen Shortness Of Breath    Post nasal drip  . Pollen Extract Shortness Of Breath    Post nasal drip Post nasal drip  . Codeine Other (See Comments)  . Morphine Itching    (+) itching and redness noted to IV insertion site following administration. See ED nurses note from 11/25 at 0449. (+) itching and redness noted to IV insertion site following administration. See ED nurses note from 11/25 at 0449.   Marland Kitchen Morphine And Related Itching    (+) itching and redness noted to IV insertion site following administration. See ED nurses note from 11/25 at 0449.    Consultations:  Infectious Disease  PCCM  General Surgery  Cardiology  Palliative Care  Nephrology   Procedures/Studies: DG Chest 1 View  Result Date: 10/12/2019 CLINICAL DATA:  Shortness of breath EXAM: CHEST  1 VIEW COMPARISON:  10/11/2019 FINDINGS: Heart is normal size. Interstitial prominence again noted throughout the lungs compatible with chronic interstitial lung disease. This is similar to prior study. No effusions. No acute bony abnormality. IMPRESSION: Stable exam.  Chronic interstitial prominence. Electronically Signed   By: Rolm Baptise M.D.   On: 10/12/2019 01:35   US RENAL  Result Date: 10/12/2019 CLINICAL DATA:  Acute renal insufficiency EXAM: RENAL / URINARY TRACT ULTRASOUND COMPLETE COMPARISON:  None. FINDINGS: Right Kidney: Renal measurements: 9.9 x 5.1 by 5.2 cm = volume: 138 mL . Echogenicity  within normal limits. No mass or hydronephrosis visualized. Left Kidney: Renal measurements: 11.4 x 6.0 by 5.1 cm = volume: 183 mL. Echotexture is normal. There is a nonobstructing 7 mm calculus within the upper pole. No hydronephrosis. Bladder: Bladder is moderately distended without filling defect. Other: Small right pleural effusion is noted.  There is trace ascites. IMPRESSION: 1. Nonobstructing 7 mm left renal calculus. 2. Otherwise unremarkable appearance of the kidneys. 3. Small right pleural effusion. 4. Trace ascites. Electronically Signed   By: Randa Ngo M.D.   On: 10/12/2019  17:31   DG Chest Portable 1 View  Result Date: 10/11/2019 CLINICAL DATA:  Hypotension. Weakness. Congestive heart failure. EXAM: PORTABLE CHEST 1 VIEW COMPARISON:  Chest x-rays dated 06/24/2016 and 05/17/2016 FINDINGS: The heart size and pulmonary vascularity are normal. The azygos vein is slightly distended chronically which suggest elevated right heart pressure. There is slight chronic accentuation of the interstitial markings at the lung bases and in the mid zones. No acute infiltrates or effusions. No acute bone abnormality. IMPRESSION: 1. No acute abnormality. 2. Chronic interstitial disease. 3. Chronic distention of the azygos vein suggesting elevated right heart pressure. Electronically Signed   By: Lorriane Shire M.D.   On: 10/11/2019 15:10   ECHOCARDIOGRAM COMPLETE  Result Date: 10/13/2019    ECHOCARDIOGRAM REPORT   Patient Name:   Miguel Palmer Date of Exam: 10/13/2019 Medical Rec #:  [id number]            Height:       74.0 in Accession #:    [id number]           Weight:       264.3 lb Date of Birth:  June 11, 1941             BSA:          2.447 m Patient Age:    79 years             BP:           93/57 mmHg Patient Gender: M                    HR:           99 bpm. Exam Location:  [facility] Procedure: 2D Echo, Color Doppler and Cardiac Doppler Indications:     Dyspnea 786.09  History:         Patient has prior  history of Echocardiogram examinations, most                  recent 05/18/2016. COPD; Risk Factors:Diabetes. Edema.  Sonographer:     Sherrie Sport RDCS (AE) Referring Phys:  [place] Diagnosing Phys: Isaias Cowman MD  Sonographer Comments: Image acquisition challenging due to COPD. IMPRESSIONS  1. Left ventricular ejection fraction, by estimation, is 60 to 65%. The left ventricle has normal function. The left ventricle has no regional wall motion abnormalities. There is mild left ventricular hypertrophy. Left ventricular diastolic parameters were normal.  2. Right ventricular systolic function is normal. The right ventricular size is moderately enlarged. There is severely elevated pulmonary artery systolic pressure.  3. Right atrial size was moderately dilated.  4. The mitral valve is normal in structure. Mild to moderate mitral valve regurgitation. No evidence of mitral stenosis.  5. Tricuspid valve regurgitation is moderate.  6. The aortic valve is normal in structure. Aortic valve regurgitation is not visualized. Moderate aortic valve stenosis.  7. The inferior vena cava is normal in size with greater than 50% respiratory variability, suggesting right atrial pressure of 3 mmHg. FINDINGS  Left Ventricle: Left ventricular ejection fraction, by estimation, is 60 to 65%. The left ventricle has normal function. The left ventricle has no regional wall motion abnormalities. The left ventricular internal cavity size was normal in size. There is  mild left ventricular hypertrophy. Left ventricular diastolic parameters were normal. Right Ventricle: The right ventricular size is moderately enlarged. No increase in right ventricular wall thickness. Right ventricular systolic function is normal. There is severely elevated pulmonary artery  systolic pressure. The tricuspid regurgitant velocity is 3.90 m/s, and with an assumed right atrial pressure of 10 mmHg, the estimated right ventricular systolic pressure is  37.3 mmHg. Left Atrium: Left atrial size was normal in size. Right Atrium: Right atrial size was moderately dilated. Pericardium: There is no evidence of pericardial effusion. Mitral Valve: The mitral valve is normal in structure. Normal mobility of the mitral valve leaflets. Mild to moderate mitral valve regurgitation. No evidence of mitral valve stenosis. Tricuspid Valve: The tricuspid valve is normal in structure. Tricuspid valve regurgitation is moderate . No evidence of tricuspid stenosis. Aortic Valve: The aortic valve is normal in structure. Aortic valve regurgitation is not visualized. Moderate aortic stenosis is present. Aortic valve mean gradient measures 20.3 mmHg. Aortic valve peak gradient measures 31.6 mmHg. Aortic valve area, by VTI measures 0.70 cm. Pulmonic Valve: The pulmonic valve was normal in structure. Pulmonic valve regurgitation is not visualized. No evidence of pulmonic stenosis. Aorta: The aortic root is normal in size and structure. Venous: The inferior vena cava is normal in size with greater than 50% respiratory variability, suggesting right atrial pressure of 3 mmHg. IAS/Shunts: No atrial level shunt detected by color flow Doppler.  LEFT VENTRICLE PLAX 2D LVIDd:         2.85 cm  Diastology LVIDs:         2.00 cm  LV e' lateral:   16.80 cm/s LV PW:         1.27 cm  LV E/e' lateral: 5.4 LV IVS:        1.58 cm  LV e' medial:    5.77 cm/s LVOT diam:     2.10 cm  LV E/e' medial:  15.7 LV SV:         53 LV SV Index:   22 LVOT Area:     3.46 cm  RIGHT VENTRICLE RV Basal diam:  5.43 cm RV S prime:     5.66 cm/s TAPSE (M-mode): 2.7 cm LEFT ATRIUM           Index       RIGHT ATRIUM           Index LA diam:      5.10 cm 2.08 cm/m  RA Area:     25.70 cm LA Vol (A4C): 60.2 ml 24.60 ml/m RA Volume:   93.30 ml  38.13 ml/m  AORTIC VALVE                    PULMONIC VALVE AV Area (Vmax):    0.85 cm     PV Vmax:        0.66 m/s AV Area (Vmean):   0.75 cm     PV Peak grad:   1.8 mmHg AV Area (VTI):      0.70 cm     RVOT Peak grad: 2 mmHg AV Vmax:           281.00 cm/s AV Vmean:          211.667 cm/s AV VTI:            0.753 m AV Peak Grad:      31.6 mmHg AV Mean Grad:      20.3 mmHg LVOT Vmax:         68.70 cm/s LVOT Vmean:        45.700 cm/s LVOT VTI:          0.153 m LVOT/AV VTI ratio: 0.20  AORTA Ao Root diam:  2.80 cm MITRAL VALVE               TRICUSPID VALVE MV Area (PHT): 3.68 cm    TR Peak grad:   60.8 mmHg MV Decel Time: 206 msec    TR Vmax:        390.00 cm/s MV E velocity: 90.70 cm/s MV A velocity: 39.80 cm/s  SHUNTS MV E/A ratio:  2.28        Systemic VTI:  0.15 m                            Systemic Diam: 2.10 cm Isaias Cowman MD Electronically signed by Isaias Cowman MD Signature Date/Time: 10/13/2019/4:02:10 PM    Final    US LIVER DOPPLER  Result Date: 10/15/2019 CLINICAL DATA:  History of NASH.  Evaluate hepatic vasculature. EXAM: DUPLEX ULTRASOUND OF LIVER TECHNIQUE: Color and duplex Doppler ultrasound was performed to evaluate the hepatic in-flow and out-flow vessels. COMPARISON:  Right upper quadrant abdominal ultrasound-10/13/2019 FINDINGS: Liver: There is mild increased echogenicity of the hepatic parenchyma with mild nodularity hepatic contour. No discrete hepatic lesions. Note is made of a small amount of perihepatic ascites. No intrahepatic biliary duct dilatation. Main Portal Vein size: 0.9 cm Portal Vein Velocities (normal hepatopetal directional flow) Main Prox:  33 cm/sec Main Mid: 35 cm/sec Main Dist:  34 cm/sec Right: 17 cm/sec Left: 15 cm/sec Hepatic Vein Velocities (normal hepatofugal directional flow) Right:  27 cm/sec Middle:  34 cm/sec Left:  14 cm/sec IVC: Present and patent with normal respiratory phasicity. Hepatic Artery Velocity:  49 cm/sec Splenic Vein Velocity:  18 cm/sec Spleen: 8 cm x 5 cm x 10 cm with a total volume of 224 cm^3 (411 cm^3 is upper limit normal) Portal Vein Occlusion/Thrombus: No Splenic Vein Occlusion/Thrombus: No Ascites: Note is made  of a small amount of intra-abdominal ascites. Varices: None IMPRESSION: 1. Patent hepatic vasculature with normal directional flow. 2. Increased echogenicity of the hepatic contour compatible with provided history of NASH. 3. Findings compatible with cirrhosis and portal venous hypertension with small amount of intra-abdominal ascites. Electronically Signed   By: Sandi Mariscal M.D.   On: 10/15/2019 10:48   US Abdomen Limited RUQ  Result Date: 10/13/2019 CLINICAL DATA:  Patient with NASH. EXAM: ULTRASOUND ABDOMEN LIMITED RIGHT UPPER QUADRANT COMPARISON:  None. FINDINGS: Gallbladder: Decompressed. Mild wall thickening measuring up to 5 mm. No cholelithiasis. Common bile duct: Diameter: 4 mm Liver: Increased in echogenicity. Portal vein is patent on color Doppler imaging with normal direction of blood flow towards the liver. Other: Moderate volume ascites. IMPRESSION: Mild gallbladder wall thickening which is nonspecific in etiology. No cholelithiasis or additional signs to suggest acute cholecystitis. Moderate volume ascites. Hepatic steatosis. Electronically Signed   By: Lovey Newcomer M.D.   On: 10/13/2019 10:44       Subjective: Patient seen with wife at bedside this AM.  He reports feeling well, anxious to get home.  He denies SOB or CP.  Says belly feels tight but not painful.  No other complaints or acute events.   Discharge Exam: Vitals:   10/18/19 0535 10/18/19 0625  BP: 98/61   Pulse: 88   Resp: (!) 22 18  Temp: 98.1 F (36.7 C)   SpO2: 92%    Vitals:   10/17/19 2128 10/18/19 0500 10/18/19 0535 10/18/19 0625  BP: 111/64  98/61   Pulse: 85  88   Resp: 20  (!) 22  18  Temp: 98.2 F (36.8 C)  98.1 F (36.7 C)   TempSrc: Oral  Oral   SpO2: 100%  92%   Weight:  122 kg    Height:        General: Pt is alert, awake, not in acute distress Cardiovascular: RRR, S1/S2 +, no rubs, no gallops Respiratory: CTA bilaterally, no wheezing, no rhonchi Abdominal: Distended but still soft,  nontender, bowel sounds + Extremities: severe BLE edema with wraps in place and proximal pitting edema, no cyanosis    The results of significant diagnostics from this hospitalization (including imaging, microbiology, ancillary and laboratory) are listed below for reference.     Microbiology: Recent Results (from the past 240 hour(s))  Blood culture (single)     Status: None   Collection Time: 10/11/19 12:49 PM   Specimen: BLOOD  Result Value Ref Range Status   Specimen Description BLOOD BLOOD RIGHT WRIST  Final   Special Requests   Final    BOTTLES DRAWN AEROBIC AND ANAEROBIC Blood Culture adequate volume   Culture   Final    NO GROWTH 5 DAYS Performed at [facility], [address] [place], [place] [address]    Report Status 10/16/2019 FINAL  Final  Blood culture (single)     Status: None   Collection Time: 10/11/19  1:01 PM   Specimen: BLOOD  Result Value Ref Range Status   Specimen Description BLOOD RIGHT ANTECUBITAL  Final   Special Requests   Final    BOTTLES DRAWN AEROBIC AND ANAEROBIC Blood Culture results may not be optimal due to an excessive volume of blood received in culture bottles   Culture   Final    NO GROWTH 5 DAYS Performed at [facility], [address] [place], [place] [address]    Report Status 10/16/2019 FINAL  Final  Respiratory Panel by RT PCR (Flu A&B, Covid) - Nasopharyngeal Swab     Status: None   Collection Time: 10/11/19  5:06 PM   Specimen: Nasopharyngeal Swab  Result Value Ref Range Status   SARS Coronavirus 2 by RT PCR NEGATIVE NEGATIVE Final    Comment: (NOTE) SARS-CoV-2 target nucleic acids are NOT DETECTED. The SARS-CoV-2 RNA is generally detectable in upper respiratoy specimens during the acute phase of infection. The lowest concentration of SARS-CoV-2 viral copies this assay can detect is 131 copies/mL. A negative result does not preclude SARS-Cov-2 infection and should not be used as the sole basis for  treatment or other patient management decisions. A negative result may occur with  improper specimen collection/handling, submission of specimen other than nasopharyngeal swab, presence of viral mutation(s) within the areas targeted by this assay, and inadequate number of viral copies (<131 copies/mL). A negative result must be combined with clinical observations, patient history, and epidemiological information. The expected result is Negative. Fact Sheet for Patients:  PinkCheek.be Fact Sheet for Healthcare Providers:  GravelBags.it This test is not yet ap proved or cleared by the [place] FDA and  has been authorized for detection and/or diagnosis of SARS-CoV-2 by FDA under an Emergency Use Authorization (EUA). This EUA will remain  in effect (meaning this test can be used) for the duration of the COVID-19 declaration under Section 564(b)(1) of the Act, 21 U.S.C. section 360bbb-3(b)(1), unless the authorization is terminated or revoked sooner.    Influenza A by PCR NEGATIVE NEGATIVE Final   Influenza B by PCR NEGATIVE NEGATIVE Final    Comment: (NOTE) The Xpert Xpress SARS-CoV-2/FLU/RSV assay  is intended as an aid in  the diagnosis of influenza from Nasopharyngeal swab specimens and  should not be used as a sole basis for treatment. Nasal washings and  aspirates are unacceptable for Xpert Xpress SARS-CoV-2/FLU/RSV  testing. Fact Sheet for Patients: PinkCheek.be Fact Sheet for Healthcare Providers: GravelBags.it This test is not yet approved or cleared by the [place] FDA and  has been authorized for detection and/or diagnosis of SARS-CoV-2 by  FDA under an Emergency Use Authorization (EUA). This EUA will remain  in effect (meaning this test can be used) for the duration of the  Covid-19 declaration under Section 564(b)(1) of the Act, 21  U.S.C. section  360bbb-3(b)(1), unless the authorization is  terminated or revoked. Performed at [facility], [address] [place], [place] [address]   MRSA PCR Screening     Status: None   Collection Time: 10/12/19  3:49 AM   Specimen: Nasopharyngeal  Result Value Ref Range Status   MRSA by PCR NEGATIVE NEGATIVE Final    Comment:        The GeneXpert MRSA Assay (FDA approved for NASAL specimens only), is one component of a comprehensive MRSA colonization surveillance program. It is not intended to diagnose MRSA infection nor to guide or monitor treatment for MRSA infections. Performed at [facility], [address], [place], [place] [address]   Aerobic/Anaerobic Culture (surgical/deep wound)     Status: None (Preliminary result)   Collection Time: 10/12/19  4:46 AM   Specimen: Leg; Wound  Result Value Ref Range Status   Specimen Description   Final    LEG RIGHT Performed at [facility], [address], [place], [place] [address]    Special Requests   Final    NONE Performed at [facility], [place], [place] [address]    Gram Stain NO WBC SEEN FEW GRAM POSITIVE COCCI IN PAIRS   Final   Culture   Final    ABUNDANT ACINETOBACTER SPECIES FEW CITROBACTER FREUNDII NO ANAEROBES ISOLATED RARE ENTEROCOCCUS FAECALIS SUSCEPTIBILITIES TO FOLLOW Performed at [facility], [place] [address] [place], [place] [address]    Report Status PENDING  Incomplete   Organism ID, Bacteria ACINETOBACTER SPECIES  Final   Organism ID, Bacteria CITROBACTER FREUNDII  Final      Susceptibility   Acinetobacter species - MIC*    CEFTAZIDIME 4 SENSITIVE Sensitive     CEFTRIAXONE 8 SENSITIVE Sensitive     CIPROFLOXACIN <=0.25 SENSITIVE Sensitive     GENTAMICIN <=1 SENSITIVE Sensitive     IMIPENEM <=0.25 SENSITIVE Sensitive     PIP/TAZO <=4 SENSITIVE Sensitive     TRIMETH/SULFA <=20 SENSITIVE Sensitive     CEFEPIME 2 SENSITIVE Sensitive      AMPICILLIN/SULBACTAM <=2 SENSITIVE Sensitive     * ABUNDANT ACINETOBACTER SPECIES   Citrobacter freundii - MIC*    CEFAZOLIN >=64 RESISTANT Resistant     CEFEPIME <=1 SENSITIVE Sensitive     CEFTAZIDIME <=1 SENSITIVE Sensitive     CEFTRIAXONE <=1 SENSITIVE Sensitive     CIPROFLOXACIN <=0.25 SENSITIVE Sensitive     GENTAMICIN <=1 SENSITIVE Sensitive     IMIPENEM <=0.25 SENSITIVE Sensitive     TRIMETH/SULFA <=20 SENSITIVE Sensitive     PIP/TAZO <=4 SENSITIVE Sensitive     * FEW CITROBACTER FREUNDII     Labs: BNP (last 3 results) Recent Labs    10/11/19 1249  BNP 3,614.4*   Basic Metabolic Panel: Recent Labs  Lab  10/13/19 0623 10/13/19 7628 10/14/19 3151 10/15/19 0329 10/16/19 0538 10/17/19 0716 10/18/19 0448  NA 140  --  137 137 138  --  142  K 3.8  --  3.9 3.4* 3.3*  --  2.8*  CL 100  --  97* 98 99  --  100  CO2 32  --  33* 31 31  --  32  GLUCOSE 108*  --  110* 101* 111*  --  93  BUN 31*  --  27* 25* 24*  --  18  CREATININE 1.22  --  1.00 0.93 1.04  --  1.08  CALCIUM 8.6*  --  8.4* 8.7* 8.9  --  9.1  MG 2.3   < > 2.2 2.2 2.2 2.1 2.2  PHOS 3.2   < > 2.8 2.5 2.4* 2.6 2.7   < > = values in this interval not displayed.   Liver Function Tests: Recent Labs  Lab 10/11/19 1249 10/11/19 1507  AST 40  --   ALT 25  --   ALKPHOS 101  --   BILITOT 2.4*  --   PROT 5.8*  --   ALBUMIN 2.8* 2.5*   Recent Labs  Lab 10/11/19 1249  LIPASE 30   No results for input(s): AMMONIA in the last 168 hours. CBC: Recent Labs  Lab 10/14/19 0511 10/15/19 0329 10/16/19 0538 10/17/19 0716 10/18/19 0448  WBC 5.3 3.6* 3.5* 3.8* 4.5  NEUTROABS 3.1 1.7 1.8 1.9 2.4  HGB 9.0* 8.2* 8.3* 8.2* 8.3*  HCT 30.3* 28.5* 28.0* 27.8* 27.8*  MCV 85.8 87.2 85.6 87.1 85.8  PLT 167 123* 127* 136* 123*   Cardiac Enzymes: No results for input(s): CKTOTAL, CKMB, CKMBINDEX, TROPONINI in the last 168 hours. BNP: Invalid input(s): POCBNP CBG: Recent Labs  Lab 10/12/19 0345  GLUCAP 121*    D-Dimer No results for input(s): DDIMER in the last 72 hours. Hgb A1c No results for input(s): HGBA1C in the last 72 hours. Lipid Profile No results for input(s): CHOL, HDL, LDLCALC, TRIG, CHOLHDL, LDLDIRECT in the last 72 hours. Thyroid function studies No results for input(s): TSH, T4TOTAL, T3FREE, THYROIDAB in the last 72 hours.  Invalid input(s): FREET3 Anemia work up No results for input(s): VITAMINB12, FOLATE, FERRITIN, TIBC, IRON, RETICCTPCT in the last 72 hours. Urinalysis No results found for: COLORURINE, APPEARANCEUR, [place], [place], GLUCOSEU, [place], [place], [place], [place], UROBILINOGEN, NITRITE, LEUKOCYTESUR Sepsis Labs Invalid input(s): PROCALCITONIN,  WBC,  LACTICIDVEN Microbiology Recent Results (from the past 240 hour(s))  Blood culture (single)     Status: None   Collection Time: 10/11/19 12:49 PM   Specimen: BLOOD  Result Value Ref Range Status   Specimen Description BLOOD BLOOD RIGHT WRIST  Final   Special Requests   Final    BOTTLES DRAWN AEROBIC AND ANAEROBIC Blood Culture adequate volume   Culture   Final    NO GROWTH 5 DAYS Performed at [facility], [address] [place], [place] [address]    Report Status 10/16/2019 FINAL  Final  Blood culture (single)     Status: None   Collection Time: 10/11/19  1:01 PM   Specimen: BLOOD  Result Value Ref Range Status   Specimen Description BLOOD RIGHT ANTECUBITAL  Final   Special Requests   Final    BOTTLES DRAWN AEROBIC AND ANAEROBIC Blood Culture results may not be optimal due to an excessive volume of blood received in culture bottles   Culture   Final    NO GROWTH 5 DAYS Performed  at [facility], [address] [place], [place] [address]    Report Status 10/16/2019 FINAL  Final  Respiratory Panel by RT PCR (Flu A&B, Covid) - Nasopharyngeal Swab     Status: None   Collection Time: 10/11/19  5:06 PM   Specimen: Nasopharyngeal Swab  Result Value Ref Range Status   SARS  Coronavirus 2 by RT PCR NEGATIVE NEGATIVE Final    Comment: (NOTE) SARS-CoV-2 target nucleic acids are NOT DETECTED. The SARS-CoV-2 RNA is generally detectable in upper respiratoy specimens during the acute phase of infection. The lowest concentration of SARS-CoV-2 viral copies this assay can detect is 131 copies/mL. A negative result does not preclude SARS-Cov-2 infection and should not be used as the sole basis for treatment or other patient management decisions. A negative result may occur with  improper specimen collection/handling, submission of specimen other than nasopharyngeal swab, presence of viral mutation(s) within the areas targeted by this assay, and inadequate number of viral copies (<131 copies/mL). A negative result must be combined with clinical observations, patient history, and epidemiological information. The expected result is Negative. Fact Sheet for Patients:  PinkCheek.be Fact Sheet for Healthcare Providers:  GravelBags.it This test is not yet ap proved or cleared by the [place] FDA and  has been authorized for detection and/or diagnosis of SARS-CoV-2 by FDA under an Emergency Use Authorization (EUA). This EUA will remain  in effect (meaning this test can be used) for the duration of the COVID-19 declaration under Section 564(b)(1) of the Act, 21 U.S.C. section 360bbb-3(b)(1), unless the authorization is terminated or revoked sooner.    Influenza A by PCR NEGATIVE NEGATIVE Final   Influenza B by PCR NEGATIVE NEGATIVE Final    Comment: (NOTE) The Xpert Xpress SARS-CoV-2/FLU/RSV assay is intended as an aid in  the diagnosis of influenza from Nasopharyngeal swab specimens and  should not be used as a sole basis for treatment. Nasal washings and  aspirates are unacceptable for Xpert Xpress SARS-CoV-2/FLU/RSV  testing. Fact Sheet for Patients: PinkCheek.be Fact Sheet  for Healthcare Providers: GravelBags.it This test is not yet approved or cleared by the [place] FDA and  has been authorized for detection and/or diagnosis of SARS-CoV-2 by  FDA under an Emergency Use Authorization (EUA). This EUA will remain  in effect (meaning this test can be used) for the duration of the  Covid-19 declaration under Section 564(b)(1) of the Act, 21  U.S.C. section 360bbb-3(b)(1), unless the authorization is  terminated or revoked. Performed at [facility], [address] [place], [place] [address]   MRSA PCR Screening     Status: None   Collection Time: 10/12/19  3:49 AM   Specimen: Nasopharyngeal  Result Value Ref Range Status   MRSA by PCR NEGATIVE NEGATIVE Final    Comment:        The GeneXpert MRSA Assay (FDA approved for NASAL specimens only), is one component of a comprehensive MRSA colonization surveillance program. It is not intended to diagnose MRSA infection nor to guide or monitor treatment for MRSA infections. Performed at [facility], [address] [place], [place] [address]   Aerobic/Anaerobic Culture (surgical/deep wound)     Status: None (Preliminary result)   Collection Time: 10/12/19  4:46 AM   Specimen: Leg; Wound  Result Value Ref Range Status   Specimen Description   Final    LEG RIGHT Performed at [facility], [address], [place], [place] [address]    Special Requests  Final    NONE Performed at [facility], [place], [place] [address]    Gram Stain NO WBC SEEN FEW GRAM POSITIVE COCCI IN PAIRS   Final   Culture   Final    ABUNDANT ACINETOBACTER SPECIES FEW CITROBACTER FREUNDII NO ANAEROBES ISOLATED RARE ENTEROCOCCUS FAECALIS SUSCEPTIBILITIES TO FOLLOW Performed at [facility], [place] [address], [place], [place] [address]    Report Status PENDING  Incomplete   Organism ID, Bacteria ACINETOBACTER SPECIES  Final    Organism ID, Bacteria CITROBACTER FREUNDII  Final      Susceptibility   Acinetobacter species - MIC*    CEFTAZIDIME 4 SENSITIVE Sensitive     CEFTRIAXONE 8 SENSITIVE Sensitive     CIPROFLOXACIN <=0.25 SENSITIVE Sensitive     GENTAMICIN <=1 SENSITIVE Sensitive     IMIPENEM <=0.25 SENSITIVE Sensitive     PIP/TAZO <=4 SENSITIVE Sensitive     TRIMETH/SULFA <=20 SENSITIVE Sensitive     CEFEPIME 2 SENSITIVE Sensitive     AMPICILLIN/SULBACTAM <=2 SENSITIVE Sensitive     * ABUNDANT ACINETOBACTER SPECIES   Citrobacter freundii - MIC*    CEFAZOLIN >=64 RESISTANT Resistant     CEFEPIME <=1 SENSITIVE Sensitive     CEFTAZIDIME <=1 SENSITIVE Sensitive     CEFTRIAXONE <=1 SENSITIVE Sensitive     CIPROFLOXACIN <=0.25 SENSITIVE Sensitive     GENTAMICIN <=1 SENSITIVE Sensitive     IMIPENEM <=0.25 SENSITIVE Sensitive     TRIMETH/SULFA <=20 SENSITIVE Sensitive     PIP/TAZO <=4 SENSITIVE Sensitive     * FEW CITROBACTER FREUNDII     Time coordinating discharge: Over 30 minutes  SIGNED:   Ezekiel Slocumb, DO [facility] 10/18/2019, 10:51 AM   If 7PM-7AM, please contact night-coverage [email or web address]

## 2019-10-18 NOTE — Plan of Care (Signed)
The patient has been discharged via EMS. No falls. IV removed. Education has been completed.  Problem: Education: Goal: Knowledge of General Education information will improve Description: Including pain rating scale, medication(s)/side effects and non-pharmacologic comfort measures Outcome: Completed/Met   Problem: Health Behavior/Discharge Planning: Goal: Ability to manage health-related needs will improve Outcome: Completed/Met   Problem: Clinical Measurements: Goal: Ability to maintain clinical measurements within normal limits will improve Outcome: Completed/Met Goal: Will remain free from infection Outcome: Completed/Met Goal: Diagnostic test results will improve Outcome: Completed/Met Goal: Respiratory complications will improve Outcome: Completed/Met Goal: Cardiovascular complication will be avoided Outcome: Completed/Met   Problem: Activity: Goal: Risk for activity intolerance will decrease Outcome: Completed/Met   Problem: Nutrition: Goal: Adequate nutrition will be maintained Outcome: Completed/Met   Problem: Coping: Goal: Level of anxiety will decrease Outcome: Completed/Met

## 2019-10-19 LAB — AEROBIC/ANAEROBIC CULTURE W GRAM STAIN (SURGICAL/DEEP WOUND): Gram Stain: NONE SEEN

## 2019-11-05 ENCOUNTER — Other Ambulatory Visit: Payer: Self-pay

## 2019-11-05 ENCOUNTER — Inpatient Hospital Stay
Admission: EM | Admit: 2019-11-05 | Discharge: 2019-11-08 | DRG: 291 | Disposition: A | Discharge status: Home / Self Care | Attending: Internal Medicine | Admitting: Internal Medicine

## 2019-11-05 DIAGNOSIS — L97909 Non-pressure chronic ulcer of unspecified part of unspecified lower leg with unspecified severity: Secondary | ICD-10-CM | POA: Diagnosis not present

## 2019-11-05 DIAGNOSIS — I5033 Acute on chronic diastolic (congestive) heart failure: Secondary | ICD-10-CM | POA: Diagnosis present

## 2019-11-05 DIAGNOSIS — I10 Essential (primary) hypertension: Secondary | ICD-10-CM | POA: Diagnosis present

## 2019-11-05 DIAGNOSIS — R531 Weakness: Secondary | ICD-10-CM

## 2019-11-05 DIAGNOSIS — J449 Chronic obstructive pulmonary disease, unspecified: Secondary | ICD-10-CM | POA: Diagnosis present

## 2019-11-05 DIAGNOSIS — R601 Generalized edema: Secondary | ICD-10-CM | POA: Diagnosis present

## 2019-11-05 DIAGNOSIS — Z66 Do not resuscitate: Secondary | ICD-10-CM | POA: Diagnosis present

## 2019-11-05 DIAGNOSIS — R140 Abdominal distension (gaseous): Secondary | ICD-10-CM

## 2019-11-05 DIAGNOSIS — I4821 Permanent atrial fibrillation: Secondary | ICD-10-CM | POA: Diagnosis present

## 2019-11-05 DIAGNOSIS — K766 Portal hypertension: Secondary | ICD-10-CM | POA: Diagnosis present

## 2019-11-05 DIAGNOSIS — Z6841 Body Mass Index (BMI) 40.0 and over, adult: Secondary | ICD-10-CM | POA: Diagnosis not present

## 2019-11-05 DIAGNOSIS — K7460 Unspecified cirrhosis of liver: Secondary | ICD-10-CM | POA: Diagnosis not present

## 2019-11-05 DIAGNOSIS — I110 Hypertensive heart disease with heart failure: Secondary | ICD-10-CM | POA: Diagnosis present

## 2019-11-05 DIAGNOSIS — D649 Anemia, unspecified: Secondary | ICD-10-CM | POA: Diagnosis not present

## 2019-11-05 DIAGNOSIS — I2510 Atherosclerotic heart disease of native coronary artery without angina pectoris: Secondary | ICD-10-CM | POA: Diagnosis present

## 2019-11-05 DIAGNOSIS — Z7901 Long term (current) use of anticoagulants: Secondary | ICD-10-CM

## 2019-11-05 DIAGNOSIS — Z20822 Contact with and (suspected) exposure to covid-19: Secondary | ICD-10-CM | POA: Diagnosis present

## 2019-11-05 DIAGNOSIS — I350 Nonrheumatic aortic (valve) stenosis: Secondary | ICD-10-CM | POA: Diagnosis present

## 2019-11-05 DIAGNOSIS — I4510 Unspecified right bundle-branch block: Secondary | ICD-10-CM | POA: Diagnosis present

## 2019-11-05 DIAGNOSIS — E119 Type 2 diabetes mellitus without complications: Secondary | ICD-10-CM

## 2019-11-05 DIAGNOSIS — G9341 Metabolic encephalopathy: Secondary | ICD-10-CM | POA: Diagnosis present

## 2019-11-05 DIAGNOSIS — G4733 Obstructive sleep apnea (adult) (pediatric): Secondary | ICD-10-CM | POA: Diagnosis present

## 2019-11-05 DIAGNOSIS — E1165 Type 2 diabetes mellitus with hyperglycemia: Secondary | ICD-10-CM | POA: Diagnosis present

## 2019-11-05 DIAGNOSIS — E782 Mixed hyperlipidemia: Secondary | ICD-10-CM | POA: Diagnosis present

## 2019-11-05 DIAGNOSIS — L97919 Non-pressure chronic ulcer of unspecified part of right lower leg with unspecified severity: Secondary | ICD-10-CM | POA: Diagnosis present

## 2019-11-05 DIAGNOSIS — Z91030 Bee allergy status: Secondary | ICD-10-CM

## 2019-11-05 DIAGNOSIS — Z87891 Personal history of nicotine dependence: Secondary | ICD-10-CM

## 2019-11-05 DIAGNOSIS — Z885 Allergy status to narcotic agent status: Secondary | ICD-10-CM

## 2019-11-05 DIAGNOSIS — I83009 Varicose veins of unspecified lower extremity with ulcer of unspecified site: Secondary | ICD-10-CM | POA: Diagnosis present

## 2019-11-05 DIAGNOSIS — Z955 Presence of coronary angioplasty implant and graft: Secondary | ICD-10-CM

## 2019-11-05 DIAGNOSIS — G629 Polyneuropathy, unspecified: Secondary | ICD-10-CM | POA: Diagnosis present

## 2019-11-05 DIAGNOSIS — I482 Chronic atrial fibrillation, unspecified: Secondary | ICD-10-CM

## 2019-11-05 DIAGNOSIS — R188 Other ascites: Secondary | ICD-10-CM | POA: Diagnosis present

## 2019-11-05 DIAGNOSIS — J8410 Pulmonary fibrosis, unspecified: Secondary | ICD-10-CM | POA: Diagnosis present

## 2019-11-05 DIAGNOSIS — G894 Chronic pain syndrome: Secondary | ICD-10-CM | POA: Diagnosis present

## 2019-11-05 DIAGNOSIS — G4700 Insomnia, unspecified: Secondary | ICD-10-CM | POA: Diagnosis present

## 2019-11-05 DIAGNOSIS — I252 Old myocardial infarction: Secondary | ICD-10-CM

## 2019-11-05 DIAGNOSIS — I272 Pulmonary hypertension, unspecified: Secondary | ICD-10-CM | POA: Diagnosis present

## 2019-11-05 DIAGNOSIS — G47419 Narcolepsy without cataplexy: Secondary | ICD-10-CM | POA: Diagnosis present

## 2019-11-05 DIAGNOSIS — D509 Iron deficiency anemia, unspecified: Secondary | ICD-10-CM | POA: Diagnosis present

## 2019-11-05 DIAGNOSIS — J9611 Chronic respiratory failure with hypoxia: Secondary | ICD-10-CM | POA: Diagnosis present

## 2019-11-05 DIAGNOSIS — Z79899 Other long term (current) drug therapy: Secondary | ICD-10-CM

## 2019-11-05 DIAGNOSIS — Z825 Family history of asthma and other chronic lower respiratory diseases: Secondary | ICD-10-CM

## 2019-11-05 DIAGNOSIS — R609 Edema, unspecified: Secondary | ICD-10-CM

## 2019-11-05 DIAGNOSIS — E440 Moderate protein-calorie malnutrition: Secondary | ICD-10-CM | POA: Diagnosis present

## 2019-11-05 DIAGNOSIS — I890 Lymphedema, not elsewhere classified: Secondary | ICD-10-CM

## 2019-11-05 DIAGNOSIS — Z820 Family history of epilepsy and other diseases of the nervous system: Secondary | ICD-10-CM

## 2019-11-05 LAB — COMPREHENSIVE METABOLIC PANEL
ALT: 19 U/L (ref 0–44)
AST: 29 U/L (ref 15–41)
Albumin: 3 g/dL — ABNORMAL LOW (ref 3.5–5.0)
Alkaline Phosphatase: 127 U/L — ABNORMAL HIGH (ref 38–126)
Anion gap: 7 (ref 5–15)
BUN: 32 mg/dL — ABNORMAL HIGH (ref 8–23)
CO2: 38 mmol/L — ABNORMAL HIGH (ref 22–32)
Calcium: 9.4 mg/dL (ref 8.9–10.3)
Chloride: 92 mmol/L — ABNORMAL LOW (ref 98–111)
Creatinine, Ser: 1.22 mg/dL (ref 0.61–1.24)
GFR calc Af Amer: >60 mL/min (ref >60)
GFR calc non Af Amer: 56 mL/min — ABNORMAL LOW (ref >60)
Glucose, Bld: 111 mg/dL — ABNORMAL HIGH (ref 70–99)
Potassium: 4.1 mmol/L (ref 3.5–5.1)
Sodium: 137 mmol/L (ref 135–145)
Total Bilirubin: 1.3 mg/dL — ABNORMAL HIGH (ref 0.3–1.2)
Total Protein: 6.3 g/dL — ABNORMAL LOW (ref 6.5–8.1)

## 2019-11-05 LAB — CBC WITH DIFFERENTIAL/PLATELET
Abs Immature Granulocytes: 0.02 10*3/uL (ref 0.00–0.07)
Basophils Absolute: 0 10*3/uL (ref 0.0–0.1)
Basophils Relative: 1 %
Eosinophils Absolute: 0 10*3/uL (ref 0.0–0.5)
Eosinophils Relative: 1 %
HCT: 28.5 % — ABNORMAL LOW (ref 39.0–52.0)
Hemoglobin: 8.2 g/dL — ABNORMAL LOW (ref 13.0–17.0)
Immature Granulocytes: 0 %
Lymphocytes Relative: 27 %
Lymphs Abs: 1.4 10*3/uL (ref 0.7–4.0)
MCH: 26.3 pg (ref 26.0–34.0)
MCHC: 28.8 g/dL — ABNORMAL LOW (ref 30.0–36.0)
MCV: 91.3 fL (ref 80.0–100.0)
Monocytes Absolute: 1 10*3/uL (ref 0.1–1.0)
Monocytes Relative: 20 %
Neutro Abs: 2.7 10*3/uL (ref 1.7–7.7)
Neutrophils Relative %: 51 %
Platelets: 202 10*3/uL (ref 150–400)
RBC: 3.12 MIL/uL — ABNORMAL LOW (ref 4.22–5.81)
RDW: 20.8 % — ABNORMAL HIGH (ref 11.5–15.5)
WBC: 5.2 10*3/uL (ref 4.0–10.5)
nRBC: 0 % (ref 0.0–0.2)

## 2019-11-05 LAB — URINALYSIS, COMPLETE (UACMP) WITH MICROSCOPIC
Bilirubin Urine: NEGATIVE
Glucose, UA: NEGATIVE mg/dL
Hgb urine dipstick: NEGATIVE
Ketones, ur: NEGATIVE mg/dL
Leukocytes,Ua: NEGATIVE
Nitrite: NEGATIVE
Protein, ur: NEGATIVE mg/dL
Specific Gravity, Urine: 1.005 (ref 1.005–1.030)
Squamous Epithelial / HPF: NONE SEEN (ref 0–5)
WBC, UA: NONE SEEN WBC/hpf (ref 0–5)
pH: 5 (ref 5.0–8.0)

## 2019-11-05 LAB — GLUCOSE, CAPILLARY: Glucose-Capillary: 107 mg/dL — ABNORMAL HIGH (ref 70–99)

## 2019-11-05 LAB — HEMOGLOBIN A1C
Hgb A1c MFr Bld: 5.6 % (ref 4.8–5.6)
Mean Plasma Glucose: 114.02 mg/dL

## 2019-11-05 LAB — SARS CORONAVIRUS 2 BY RT PCR (HOSPITAL ORDER, PERFORMED IN ~~LOC~~ HOSPITAL LAB): SARS Coronavirus 2: NEGATIVE

## 2019-11-05 MED ORDER — INSULIN ASPART 100 UNIT/ML ~~LOC~~ SOLN
0.0000 [IU] | Freq: Three times a day (TID) | SUBCUTANEOUS | Status: DC
  Administered 2019-11-06 – 2019-11-07 (×2): 3 [IU] via SUBCUTANEOUS
  Filled 2019-11-05 (×2): qty 1

## 2019-11-05 MED ORDER — SODIUM CHLORIDE 0.9% FLUSH
3.0000 mL | Freq: Two times a day (BID) | INTRAVENOUS | Status: DC
  Administered 2019-11-05 – 2019-11-08 (×6): 3 mL via INTRAVENOUS

## 2019-11-05 MED ORDER — GABAPENTIN 400 MG PO CAPS
400.0000 mg | ORAL_CAPSULE | Freq: Four times a day (QID) | ORAL | Status: DC
  Administered 2019-11-05 – 2019-11-08 (×11): 400 mg via ORAL
  Filled 2019-11-05 (×13): qty 1

## 2019-11-05 MED ORDER — FUROSEMIDE 10 MG/ML IJ SOLN
80.0000 mg | Freq: Once | INTRAMUSCULAR | Status: AC
  Administered 2019-11-05: 80 mg via INTRAVENOUS
  Filled 2019-11-05: qty 8

## 2019-11-05 MED ORDER — ENOXAPARIN SODIUM 40 MG/0.4ML ~~LOC~~ SOLN
40.0000 mg | SUBCUTANEOUS | Status: DC
  Administered 2019-11-05: 40 mg via SUBCUTANEOUS
  Filled 2019-11-05: qty 0.4

## 2019-11-05 MED ORDER — ONDANSETRON HCL 4 MG/2ML IJ SOLN: 4.0000 mg | Freq: Four times a day (QID) | INTRAMUSCULAR | Status: DC | PRN

## 2019-11-05 MED ORDER — GUAIFENESIN ER 600 MG PO TB12
600.0000 mg | ORAL_TABLET | Freq: Two times a day (BID) | ORAL | Status: DC
  Administered 2019-11-05 – 2019-11-08 (×6): 600 mg via ORAL
  Filled 2019-11-05 (×6): qty 1

## 2019-11-05 MED ORDER — SODIUM CHLORIDE 0.9 % IV SOLN: 250.0000 mL | INTRAVENOUS | Status: DC | PRN

## 2019-11-05 MED ORDER — ACETAMINOPHEN 325 MG PO TABS
650.0000 mg | ORAL_TABLET | Freq: Four times a day (QID) | ORAL | Status: DC | PRN
  Administered 2019-11-06: 650 mg via ORAL
  Filled 2019-11-05: qty 2

## 2019-11-05 MED ORDER — SODIUM CHLORIDE 0.9% FLUSH: 3.0000 mL | INTRAVENOUS | Status: DC | PRN

## 2019-11-05 MED ORDER — ACETAMINOPHEN 650 MG RE SUPP: 650.0000 mg | Freq: Four times a day (QID) | RECTAL | Status: DC | PRN

## 2019-11-05 MED ORDER — BUMETANIDE 0.25 MG/ML IJ SOLN
1.0000 mg | Freq: Two times a day (BID) | INTRAMUSCULAR | Status: DC
  Administered 2019-11-05 – 2019-11-06 (×2): 1 mg via INTRAVENOUS
  Filled 2019-11-05 (×5): qty 4

## 2019-11-05 MED ORDER — NITROGLYCERIN 0.4 MG SL SUBL: 0.4000 mg | SUBLINGUAL_TABLET | SUBLINGUAL | Status: DC | PRN

## 2019-11-05 MED ORDER — DEXTROAMPHETAMINE SULFATE ER 5 MG PO CP24
15.0000 mg | ORAL_CAPSULE | Freq: Three times a day (TID) | ORAL | Status: DC
  Administered 2019-11-06 – 2019-11-07 (×2): 15 mg via ORAL
  Filled 2019-11-05 (×5): qty 3

## 2019-11-05 MED ORDER — MODAFINIL 100 MG PO TABS
100.0000 mg | ORAL_TABLET | Freq: Three times a day (TID) | ORAL | Status: DC
  Administered 2019-11-06 – 2019-11-08 (×8): 100 mg via ORAL
  Filled 2019-11-05 (×10): qty 1

## 2019-11-05 MED ORDER — ONDANSETRON HCL 4 MG PO TABS: 4.0000 mg | ORAL_TABLET | Freq: Four times a day (QID) | ORAL | Status: DC | PRN

## 2019-11-05 NOTE — ED Provider Notes (Signed)
[facility] Emergency Department Provider Note   ____________________________________________    I have reviewed the triage vital signs and the nursing notes.   HISTORY  Chief Complaint Groin Swelling     HPI Miguel Palmer is a 79 y.o. male with history of diabetes, COPD, CAD, edema who presents with complaints of diffuse swelling. Patient is unable to ambulate, per wife he does "scoot around on his walker ". Does take torsemide for edema however over the last week has had significant swelling particularly in the scrotum which she notes is the size of a cantaloupe now. Patient denies shortness of breath. No chest pain. No fevers or chills. No redness. He is starting to have difficulty urinating because of the swelling  Past Medical History:  Diagnosis Date  . Arteriosclerosis    7 stents placed no s/s x 7 years  . Biceps tendinitis 05/29/2014  . COPD (chronic obstructive pulmonary disease) ([place])   . Diabetes mellitus without complication ([place])   . Disorder of rotator cuff 05/29/2014  . Edema    bilateral lower extremities  . Frank hematuria 04/28/2013  . Memory change 01/13/2014  . Narcolepsy   . Obesity   . 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 Oxygen deficiency     Patient Active Problem List   Diagnosis Date Noted  . Pulmonary hypertension ([place]) 10/17/2019  . Anasarca 10/17/2019  . Hypoalbuminemia 10/17/2019  . Lymphedema 10/17/2019  . Weakness generalized   . Palliative care by specialist   . Malnutrition of moderate degree 10/12/2019  . Hypotension   . Goals of care, counseling/discussion   . DNR (do not resuscitate)   . HCAP (healthcare-associated pneumonia) 06/24/2016  . Community acquired pneumonia 05/17/2016  . Chest pain 05/17/2016  . Diabetes mellitus without complication ([place]) 44/92/0100  . Chronic bilateral low back pain with right-sided sciatica 05/01/2016  . Long term current use of anticoagulant therapy  05/01/2016  . Chronic pain of right lower extremity 05/01/2016  . Spinal stenosis of lumbar region 05/01/2016  . Other spondylosis with radiculopathy, lumbar region 05/01/2016  . Spondylolisthesis of lumbar region 05/01/2016  . Chronic pain syndrome 05/01/2016  . Hypoxemia requiring supplemental oxygen 12/27/2015  . Morbid obesity with BMI of 40.0-44.9, adult ([place]) 11/22/2015  . Chronic respiratory failure with hypoxia ([place]) 09/05/2015  . Pulmonary fibrosis, unspecified ([place]) 09/05/2015  . Restrictive airway disease 06/20/2015  . COPD (chronic obstructive pulmonary disease) ([place]) 04/01/2015  . Mixed hyperlipidemia 01/05/2015  . Non-ST elevation myocardial infarction (NSTEMI), subendocardial infarction, subsequent episode of care ([place]) 01/05/2015  . HLD (hyperlipidemia) 10/31/2014  . Heart attack ([place]) 10/31/2014  . Difficulty hearing 03/17/2014  . Abnormal gait 10/09/2013  . Obstructive apnea 08/30/2012  . 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 Prostate cancer screening 09/23/2011  . Vitamin D deficiency 09/23/2011  . Arteriosclerosis of coronary artery 09/04/2011  . CAD (coronary artery disease) 09/04/2011  . Allergic rhinitis 06/25/2011  . 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 Lower extremity weakness 03/30/2011  . Neuropathy ([place]) 01/17/2011    Past Surgical History:  Procedure Laterality Date  . CORONARY STENT PLACEMENT    . KNEE ARTHROSCOPY Right     Prior to Admission medications   Medication Sig Start Date End Date Taking? Authorizing Provider  dextroamphetamine (DEXEDRINE SPANSULE) 15 MG 24 hr capsule Take 1 capsule by mouth 3 (three) times daily.  05/29/16  Yes [provider]  gabapentin (NEURONTIN) 400 MG capsule Take 400 mg by mouth 4 (four) times daily.  Yes [provider]  guaiFENesin (MUCINEX) 600 MG 12 hr tablet Take by mouth 2 (two) times daily.   Yes [provider]  modafinil (PROVIGIL) 100 MG tablet Take 100 mg by mouth 3 (three) times  daily.  03/26/16  Yes [provider]  nitroGLYCERIN (NITROSTAT) 0.4 MG SL tablet Place 0.4 mg under the tongue every 5 (five) minutes as needed for chest pain.  10/09/13  Yes [provider]  torsemide (DEMADEX) 20 MG tablet Take 1 tablet (20 mg total) by mouth daily. Patient taking differently: Take 40 mg by mouth daily.  10/19/19  Yes Nicole Kindred A, DO     Allergies Bee pollen, Pollen extract, Codeine, Morphine, and Morphine and related  Family History  Problem Relation Age of Onset  . Dementia Mother   . Asthma Father   . Emphysema Father   . Alzheimer's disease Father     Social History Social History   Tobacco Use  . Smoking status: Former Smoker    Packs/day: 2.00    Years: 22.00    Pack years: 44.00    Types: Cigarettes  . Smokeless tobacco: Never Used  Substance Use Topics  . Alcohol use: No    Alcohol/week: 0.0 standard drinks  . Drug use: No    Review of Systems  Constitutional: No fever/chills Eyes: No visual changes.  ENT: No sore throat. Cardiovascular: Denies chest pain. Respiratory: Denies shortness of breath. Gastrointestinal: No abdominal pain.   Genitourinary: As above Musculoskeletal: As above Skin: Negative for rash. Neurological: Negative for headaches   ____________________________________________   PHYSICAL EXAM:  VITAL SIGNS: ED Triage Vitals  Enc Vitals Group     BP 11/05/19 1117 105/72     Pulse Rate 11/05/19 1117 (!) 112     Resp 11/05/19 1117 (!) 26     Temp 11/05/19 1118 98.3 F (36.8 C)     Temp Source 11/05/19 1118 Oral     SpO2 11/05/19 1117 99 %     Weight 11/05/19 1112 113.4 kg (250 lb)     Height 11/05/19 1112 1.905 m (6' 3" )     Head Circumference --      Peak Flow --      Pain Score --      Pain Loc --      Pain Edu? --      Excl. in [place]? --     Constitutional: Alert and oriented.  Nose: No congestion/rhinnorhea. Mouth/Throat: Mucous membranes are moist.   Neck:  Painless  ROM Cardiovascular: Normal rate, regular rhythm. Grossly normal heart sounds.  Good peripheral circulation. Respiratory: Normal respiratory effort.  No retractions. Lungs CTAB. Gastrointestinal: Soft and nontender. No distention.  No CVA tenderness. Genitourinary: Significant bilateral edema to the scrotum, no evidence of infection or erythema, scrotum is grapefruit size, swelling of the penis with the glans severely retracted Musculoskeletal: Edema to the bilateral lower extremity warm and well perfused Neurologic:  Normal speech and language. No gross focal neurologic deficits are appreciated.  Skin:  Skin is warm, dry and intact. No rash noted. Psychiatric: Mood and affect are normal. Speech and behavior are normal.  ____________________________________________   LABS (all labs ordered are listed, but only abnormal results are displayed)  Labs Reviewed  COMPREHENSIVE METABOLIC PANEL - Abnormal; Notable for the following components:      Result Value   Chloride 92 (*)    CO2 38 (*)    Glucose, Bld 111 (*)    BUN 32 (*)  Total Protein 6.3 (*)    Albumin 3.0 (*)    Alkaline Phosphatase 127 (*)    Total Bilirubin 1.3 (*)    GFR calc non Af Amer 56 (*)    All other components within normal limits  CBC WITH DIFFERENTIAL/PLATELET - Abnormal; Notable for the following components:   RBC 3.12 (*)    Hemoglobin 8.2 (*)    HCT 28.5 (*)    MCHC 28.8 (*)    RDW 20.8 (*)    All other components within normal limits  SARS CORONAVIRUS 2 BY RT PCR (HOSPITAL ORDER, [facility])  URINALYSIS, COMPLETE (UACMP) WITH MICROSCOPIC   ____________________________________________  EKG  ED ECG REPORT I, Lavonia Drafts, the attending physician, personally viewed and interpreted this ECG.  Date: 11/05/2019  Rhythm: Atrial flutter QRS Axis: normal Intervals: Abnormal ST/T Wave abnormalities: normal Narrative Interpretation: no evidence of acute  ischemia  ____________________________________________  RADIOLOGY  None ____________________________________________   PROCEDURES  Procedure(s) performed: No  Procedures   Critical Care performed: No ____________________________________________   INITIAL IMPRESSION / ASSESSMENT AND PLAN / ED COURSE  Pertinent labs & imaging results that were available during my care of the patient were reviewed by me and considered in my medical decision making (see chart for details).  Patient presents with significant scrotal swelling. Given his history consistent with edema/anasarca. Not consistent with infection given bilateral nature. He is having difficulty urinating because of the diffuse swelling of the scrotum and penis. He will require significant diuresis and will require admission to the hospital.    ____________________________________________   FINAL CLINICAL IMPRESSION(S) / ED DIAGNOSES  Final diagnoses:  Anasarca        Note:  This document was prepared using Dragon voice recognition software and may include unintentional dictation errors.   Lavonia Drafts, MD 11/05/19 [phone]

## 2019-11-05 NOTE — ED Notes (Signed)
Message sent to pharmacy to verify medications 

## 2019-11-05 NOTE — H&P (Addendum)
History and Physical    Miguel Palmer VOP:[id number] DOB: 11-24-1940 DOA: 11/05/2019  PCP: [name], MD   Patient coming from: Home with hospice  I have personally briefly reviewed patient's old medical records in [place]  Chief Complaint: Scrotal swelling  HPI: Miguel Palmer is aN 79 y.o. male with medical history significant for diabetes mellitus, COPD, coronary artery disease, pulmonary fibrosis, morbid obesity and pulmonary hypertension who presents to the emergency room for evaluation of generalized body swelling but mostly in the scrotal area which has affected his being able to void. Patient is on torsemide which he has been taking without any significant improvement. He has shortness of breath and lower extremity swelling which is chronic but denies having any nausea, vomiting, changes in his bowel habits, dizziness or lightheadedness.  He denies having any headaches, fever or chills.  He is still able to ambulate with a rolling walker but for short distances.   ED Course: 79 year old Caucasian male on hospice at home who presents to the emergency room for evaluation of generalized swelling and difficulty voiding due to scrotal swelling.  Unable to place a Foley catheter in the emergency room.  Patient got a dose of Lasix 80 mg IV x1.  Patient will be admitted for diuresis.  Review of Systems: As per HPI otherwise 10 point review of systems negative.   Past Medical History:  Diagnosis Date  . Arteriosclerosis    7 stents placed no s/s x 7 years  . Biceps tendinitis 05/29/2014  . COPD (chronic obstructive pulmonary disease) ([place])   . Diabetes mellitus without complication ([place])   . Disorder of rotator cuff 05/29/2014  . Edema    bilateral lower extremities  . Frank hematuria 04/28/2013  . Memory change 01/13/2014  . Narcolepsy   . Obesity   . 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 Oxygen deficiency     Past Surgical History:  Procedure  Laterality Date  . CORONARY STENT PLACEMENT    . KNEE ARTHROSCOPY Right      reports that he has quit smoking. His smoking use included cigarettes. He has a 44.00 pack-year smoking history. He has never used smokeless tobacco. He reports that he does not drink alcohol or use drugs.  Allergies  Allergen Reactions  . Bee Pollen Shortness Of Breath    Post nasal drip  . Pollen Extract Shortness Of Breath    Post nasal drip Post nasal drip  . Codeine Other (See Comments)  . Morphine Itching    (+) itching and redness noted to IV insertion site following administration. See ED nurses note from 11/25 at 0449. (+) itching and redness noted to IV insertion site following administration. See ED nurses note from 11/25 at 0449.   Marland Kitchen Morphine And Related Itching    (+) itching and redness noted to IV insertion site following administration. See ED nurses note from 11/25 at 0449.    Family History  Problem Relation Age of Onset  . Dementia Mother   . Asthma Father   . Emphysema Father   . Alzheimer's disease Father      Prior to Admission medications   Medication Sig Start Date End Date Taking? Authorizing Provider  acetaminophen (TYLENOL) 325 MG tablet Take 2 tablets (650 mg total) by mouth every 6 (six) hours as needed for mild pain (or Fever >/= 101). 06/27/16   Nicholes Mango, MD  Amino Acids-Protein Hydrolys (FEEDING SUPPLEMENT, PRO-STAT SUGAR FREE 64,) LIQD Take 30 mLs by  mouth 3 (three) times daily. 10/18/19   Ezekiel Slocumb, DO  chlorhexidine (PERIDEX) 0.12 % solution Use as directed 15 mLs in the mouth or throat 2 (two) times daily. After breakfast and before bedtime 09/05/19   [provider]  collagenase (SANTYL) ointment Apply 1 application topically daily. 10/18/19   Ezekiel Slocumb, DO  dextroamphetamine (DEXEDRINE SPANSULE) 15 MG 24 hr capsule Take 1 capsule by mouth 3 (three) times daily.  05/29/16   [provider]  ELIQUIS 5 MG TABS tablet Take 5 mg by mouth  2 (two) times daily. 07/28/19   [provider]  feeding supplement, ENSURE ENLIVE, (ENSURE ENLIVE) LIQD Take 237 mLs by mouth 2 (two) times daily between meals. 10/18/19   Ezekiel Slocumb, DO  fludrocortisone (FLORINEF) 0.1 MG tablet Take 1 tablet (0.1 mg total) by mouth 2 (two) times daily. 10/18/19   Ezekiel Slocumb, DO  gabapentin (NEURONTIN) 400 MG capsule Take 400 mg by mouth 4 (four) times daily.    [provider]  Glucosamine Sulfate 1000 MG CAPS Take 1,000 mg by mouth 2 (two) times daily.     [provider]  guaiFENesin (MUCINEX) 600 MG 12 hr tablet Take 600 mg by mouth 2 (two) times daily as needed.     [provider]  LORazepam (ATIVAN) 2 MG/ML concentrated solution Take 0.5 mLs (1 mg total) by mouth every 6 (six) hours as needed (anxiety or agitation). 10/18/19   Ezekiel Slocumb, DO  midodrine (PROAMATINE) 5 MG tablet Take 3 tablets (15 mg total) by mouth 3 (three) times daily with meals. 10/18/19   Nicole Kindred A, DO  modafinil (PROVIGIL) 100 MG tablet Take 300 mg by mouth daily.  03/26/16   [provider]  morphine 20 MG/5ML solution Take 1.3 mLs (5.2 mg total) by mouth every 2 (two) hours as needed for pain (or air hunger). 10/18/19   Ezekiel Slocumb, DO  Multiple Vitamins-Minerals (MULTIVITAMIN & MINERAL PO) Take by mouth.    [provider]  nitroGLYCERIN (NITROSTAT) 0.4 MG SL tablet Place 0.4 mg under the tongue every 5 (five) minutes as needed for chest pain.  10/09/13   [provider]  PHOSPHATIDYLSERINE PO Take 60 mg by mouth 3 (three) times daily.    [provider]  tiotropium (SPIRIVA) 18 MCG inhalation capsule Place 1 capsule (18 mcg total) into inhaler and inhale daily. 06/28/16   Nicholes Mango, MD  torsemide (DEMADEX) 20 MG tablet Take 1 tablet (20 mg total) by mouth daily. 10/19/19   Ezekiel Slocumb, DO    Physical Exam: Vitals:   11/05/19 1112 11/05/19 1117 11/05/19 1118  BP:  105/72     Pulse:  (!) 112   Resp:  (!) 26   Temp:   98.3 F (36.8 C)  TempSrc:   Oral  SpO2:  99%   Weight: 113.4 kg    Height: 6' 3"  (1.905 m)       Vitals:   11/05/19 1112 11/05/19 1117 11/05/19 1118  BP:  105/72   Pulse:  (!) 112   Resp:  (!) 26   Temp:   98.3 F (36.8 C)  TempSrc:   Oral  SpO2:  99%   Weight: 113.4 kg    Height: 6' 3"  (1.905 m)      Constitutional: NAD, alert and oriented x 3. Chronically ill appearing Eyes: PERRL, lids and conjunctivae pale ENMT: Mucous membranes are moist.  Neck: normal, supple, no masses,  no thyromegaly Respiratory:  Air movement in all lung fields, scattered crackles. Normal respiratory effort. No accessory muscle use.  Cardiovascular: Irregular irregular,  Tachycardic,no murmurs / rubs / gallops. No extremity edema. 2+ pedal pulses. No carotid bruits.  Abdomen: no tenderness, no masses palpated. No hepatosplenomegaly. Bowel sounds positive.  Musculoskeletal: no clubbing / cyanosis. No joint deformity upper and lower extremities.  Skin: no rashes, lesions, ulcers.  Neurologic: No gross focal neurologic deficit. Psychiatric: Normal mood and affect.   Labs on Admission: I have personally reviewed following labs and imaging studies  CBC: Recent Labs  Lab 11/05/19 1118  WBC 5.2  NEUTROABS 2.7  HGB 8.2*  HCT 28.5*  MCV 91.3  PLT 951   Basic Metabolic Panel: Recent Labs  Lab 11/05/19 1118  NA 137  K 4.1  CL 92*  CO2 38*  GLUCOSE 111*  BUN 32*  CREATININE 1.22  CALCIUM 9.4   GFR: Estimated Creatinine Clearance: 65.6 mL/min (by C-G formula based on SCr of 1.22 mg/dL). Liver Function Tests: Recent Labs  Lab 11/05/19 1118  AST 29  ALT 19  ALKPHOS 127*  BILITOT 1.3*  PROT 6.3*  ALBUMIN 3.0*   No results for input(s): LIPASE, AMYLASE in the last 168 hours. No results for input(s): AMMONIA in the last 168 hours. Coagulation Profile: No results for input(s): INR, PROTIME in the last 168 hours. Cardiac Enzymes: No  results for input(s): CKTOTAL, CKMB, CKMBINDEX, TROPONINI in the last 168 hours. BNP (last 3 results) No results for input(s): PROBNP in the last 8760 hours. HbA1C: No results for input(s): HGBA1C in the last 72 hours. CBG: No results for input(s): GLUCAP in the last 168 hours. Lipid Profile: No results for input(s): CHOL, HDL, LDLCALC, TRIG, CHOLHDL, LDLDIRECT in the last 72 hours. Thyroid Function Tests: No results for input(s): TSH, T4TOTAL, FREET4, T3FREE, THYROIDAB in the last 72 hours. Anemia Panel: No results for input(s): VITAMINB12, FOLATE, FERRITIN, TIBC, IRON, RETICCTPCT in the last 72 hours. Urine analysis: No results found for: COLORURINE, APPEARANCEUR, LABSPEC, PHURINE, GLUCOSEU, HGBUR, BILIRUBINUR, KETONESUR, PROTEINUR, UROBILINOGEN, NITRITE, LEUKOCYTESUR  Radiological Exams on Admission: No results found.  EKG: Independently reviewed.  Atrial Flutter Incomplete RBBB  Assessment/Plan Principal Problem:   Anasarca Active Problems:   Obstructive apnea   COPD (chronic obstructive pulmonary disease) (HCC)   Chronic respiratory failure with hypoxia (HCC)   Diabetes mellitus without complication (HCC)   Morbid obesity with BMI of 40.0-44.9, adult (HCC)   Chronic pain syndrome   Pulmonary hypertension ([place])   Lymphedema      Anasarca Likely from pulmonary hypertension ??  Patient has developed resistance to diuretic therapy due to poor response or ?? Non compliance Hold torsemide while in the hospital and place patient on IV Bumex as blood pressure tolerates Maintain fluid restriction    Chronic diastolic CHF LVEF of 60 to 65% with severely elevated pulmonary artery systolic pressure Maintain fluid restriction and low-sodium diet Continue diuretic therapy as much as blood pressure tolerates    Permanent Atrial fibrillation:  Rate controlled    Diabetes mellitus type II:  Hold oral hyperglycemic agents.  Sliding scale insulin while  hospitalized.   Narcolepsy:  Continue Provigil and Adderall     DVT prophylaxis: Lovenox Code Status: DNR Family Communication: Greater than 50% of time was spent discussing patient's condition and plan of care with him at the bedside.  All questions and concerns have been addressed.  CODE STATUS was discussed and he is a DO NOT RESUSCITATE.  Disposition Plan: Back to previous home environment Consults called: None    Feliza Diven MD [facility]     11/05/2019, 12:36 PM

## 2019-11-05 NOTE — ED Triage Notes (Signed)
Pt arrives via EMS from home after having scrotal swelling for the last 2 weeks- pt is a hospice pt with multiorgan failure- pt's wife has been measuring the swelling and got 18inches at last measurement- pt c/o of difficulty urinating- pt is AMS at baseline and is currently at his baseline

## 2019-11-05 NOTE — ED Notes (Signed)
Pt given meal tray.

## 2019-11-05 NOTE — Plan of Care (Signed)
  Problem: Education: Goal: Knowledge of [facility] Education information/materials will improve Outcome: Progressing Goal: Emotional status will improve Outcome: Progressing Goal: Mental status will improve Outcome: Progressing Goal: Verbalization of understanding the information provided will improve Outcome: Progressing   Problem: Activity: Goal: Interest or engagement in activities will improve Outcome: Progressing Goal: Sleeping patterns will improve Outcome: Progressing

## 2019-11-05 NOTE — ED Notes (Signed)
Report given to Kelly, RN.

## 2019-11-05 NOTE — Progress Notes (Signed)
   11/05/19 2101  Assess: MEWS Score  Temp 98.7 F (37.1 C)  BP 94/64  Pulse Rate (!) 103  Resp (!) 22  Level of Consciousness Alert  SpO2 96 %  O2 Device Nasal Cannula  O2 Flow Rate (L/min) 4 L/min  Assess: MEWS Score  MEWS Temp 0  MEWS Systolic 1  MEWS Pulse 1  MEWS RR 1  MEWS LOC 0  MEWS Score 3  MEWS Score Color Yellow  Assess: if the MEWS score is Yellow or Red  Were vital signs taken at a resting state? Yes  Focused Assessment Documented focused assessment  Early Detection of Sepsis Score *See Row Information* Low  MEWS guidelines implemented *See Row Information* Yes  Treat  MEWS Interventions Other (Comment) (Notified covering provider)  Take Vital Signs  Increase Vital Sign Frequency  Yellow: Q 2hr X 2 then Q 4hr X 2, if remains yellow, continue Q 4hrs  Escalate  MEWS: Escalate Yellow: discuss with charge nurse/RN and consider discussing with provider and RRT  Notify: Charge Nurse/RN  Name of Charge Nurse/RN Notified Chief Operating Officer  Date Charge Nurse/RN Notified 11/05/19  Time Charge Nurse/RN Notified 2200  Notify: Provider  Provider Name/Title Ouma NP  Date Provider Notified 11/05/19  Time Provider Notified 2115  Notification Type Page  Notification Reason Other (Comment) (MEWs yellow and patient in afib)  Response No new orders (continue to monitor)  Date of Provider Response 11/05/19  Time of Provider Response 2120

## 2019-11-06 ENCOUNTER — Inpatient Hospital Stay

## 2019-11-06 DIAGNOSIS — L97909 Non-pressure chronic ulcer of unspecified part of unspecified lower leg with unspecified severity: Secondary | ICD-10-CM

## 2019-11-06 DIAGNOSIS — I350 Nonrheumatic aortic (valve) stenosis: Secondary | ICD-10-CM

## 2019-11-06 DIAGNOSIS — I83009 Varicose veins of unspecified lower extremity with ulcer of unspecified site: Secondary | ICD-10-CM

## 2019-11-06 DIAGNOSIS — R188 Other ascites: Secondary | ICD-10-CM

## 2019-11-06 DIAGNOSIS — I5033 Acute on chronic diastolic (congestive) heart failure: Secondary | ICD-10-CM

## 2019-11-06 DIAGNOSIS — E440 Moderate protein-calorie malnutrition: Secondary | ICD-10-CM

## 2019-11-06 DIAGNOSIS — I482 Chronic atrial fibrillation, unspecified: Secondary | ICD-10-CM

## 2019-11-06 DIAGNOSIS — R601 Generalized edema: Secondary | ICD-10-CM

## 2019-11-06 DIAGNOSIS — G9341 Metabolic encephalopathy: Secondary | ICD-10-CM

## 2019-11-06 DIAGNOSIS — G4700 Insomnia, unspecified: Secondary | ICD-10-CM

## 2019-11-06 DIAGNOSIS — K7460 Unspecified cirrhosis of liver: Secondary | ICD-10-CM

## 2019-11-06 DIAGNOSIS — I272 Pulmonary hypertension, unspecified: Secondary | ICD-10-CM

## 2019-11-06 LAB — BASIC METABOLIC PANEL
Anion gap: 7 (ref 5–15)
BUN: 30 mg/dL — ABNORMAL HIGH (ref 8–23)
CO2: 39 mmol/L — ABNORMAL HIGH (ref 22–32)
Calcium: 9.2 mg/dL (ref 8.9–10.3)
Chloride: 94 mmol/L — ABNORMAL LOW (ref 98–111)
Creatinine, Ser: 1.11 mg/dL (ref 0.61–1.24)
GFR calc Af Amer: >60 mL/min (ref >60)
GFR calc non Af Amer: >60 mL/min (ref >60)
Glucose, Bld: 128 mg/dL — ABNORMAL HIGH (ref 70–99)
Potassium: 3.7 mmol/L (ref 3.5–5.1)
Sodium: 140 mmol/L (ref 135–145)

## 2019-11-06 LAB — CBC
HCT: 26.8 % — ABNORMAL LOW (ref 39.0–52.0)
Hemoglobin: 7.6 g/dL — ABNORMAL LOW (ref 13.0–17.0)
MCH: 25.9 pg — ABNORMAL LOW (ref 26.0–34.0)
MCHC: 28.4 g/dL — ABNORMAL LOW (ref 30.0–36.0)
MCV: 91.2 fL (ref 80.0–100.0)
Platelets: 179 10*3/uL (ref 150–400)
RBC: 2.94 MIL/uL — ABNORMAL LOW (ref 4.22–5.81)
RDW: 20.4 % — ABNORMAL HIGH (ref 11.5–15.5)
WBC: 4.6 10*3/uL (ref 4.0–10.5)
nRBC: 0 % (ref 0.0–0.2)

## 2019-11-06 LAB — GLUCOSE, CAPILLARY
Glucose-Capillary: 103 mg/dL — ABNORMAL HIGH (ref 70–99)
Glucose-Capillary: 139 mg/dL — ABNORMAL HIGH (ref 70–99)
Glucose-Capillary: 93 mg/dL (ref 70–99)

## 2019-11-06 LAB — AMMONIA: Ammonia: 39 umol/L — ABNORMAL HIGH (ref 9–35)

## 2019-11-06 LAB — VITAMIN B12: Vitamin B-12: 340 pg/mL (ref 180–914)

## 2019-11-06 MED ORDER — BUMETANIDE 0.25 MG/ML IJ SOLN
1.0000 mg | Freq: Two times a day (BID) | INTRAMUSCULAR | Status: DC
  Administered 2019-11-06 – 2019-11-08 (×4): 1 mg via INTRAVENOUS
  Filled 2019-11-06 (×4): qty 4

## 2019-11-06 MED ORDER — QUETIAPINE FUMARATE 25 MG PO TABS
25.0000 mg | ORAL_TABLET | Freq: Every day | ORAL | Status: DC
  Administered 2019-11-06 – 2019-11-07 (×2): 25 mg via ORAL
  Filled 2019-11-06 (×2): qty 1

## 2019-11-06 MED ORDER — METOPROLOL SUCCINATE ER 25 MG PO TB24
12.5000 mg | ORAL_TABLET | Freq: Every day | ORAL | Status: DC
  Administered 2019-11-06 – 2019-11-07 (×2): 12.5 mg via ORAL
  Filled 2019-11-06 (×2): qty 1

## 2019-11-06 MED ORDER — LACTULOSE 10 GM/15ML PO SOLN
30.0000 g | Freq: Every day | ORAL | Status: DC
  Administered 2019-11-06 – 2019-11-07 (×2): 30 g via ORAL
  Filled 2019-11-06 (×2): qty 60

## 2019-11-06 NOTE — Consult Note (Signed)
WOC Nurse Consult Note: Reason for Consult:Patient known to use from recent admission. Seen by my partner K. Sanders on 4/21. Wound type:trauma vs arterial vs venous insufficiency Pressure Injury POA:N/A Measurement:Bedside RN to measure today. One healed and two small healing wounds on right pretibial area. Wound bed:red, moist Drainage (amount, consistency, odor) small amount of serous on old dressing Periwound: ruddy (hemosiderin staining) Dressing procedure/placement/frequency:Conservative wound care orders are provided for Nursing and include twice daily topical care for the RLE wounds with xeroform gauze (antimicrobial, astringent) as a wound contact layer and Kerlix topped with ACE bandage for securement with the added benefit of mild compression.  Floatation of heels is requested.  A silicone foam dressing is placed to the sacrum for pressure injury prophylaxis.  Turning and repositioning is in place.  [facility] nursing team will not follow, but will remain available to this patient, the nursing and medical teams.  Please re-consult if needed. Thanks, Maudie Flakes, MSN, RN, [place], Arther Abbott  Pager# [phone]

## 2019-11-06 NOTE — Progress Notes (Signed)
[employer].E. wound dressing at this time, as ordered Q shift. Patient tolerated well. Measured wounds as well. Will pass along in shift report. Will continue to monitor for the remainder of the shift. Wenda Low [facility]

## 2019-11-06 NOTE — Progress Notes (Signed)
Patient ID: Miguel Palmer, male   DOB: 1940/09/26, 79 y.o.   MRN: [id number] [facility] PROGRESS NOTE  Miguel Palmer YHC:[id number] DOB: September 06, 1940 DOA: 11/05/2019 PCP: [name], MD  HPI/Subjective: Patient came in with scrotal swelling.  Wife is concerned about his mental status stating that he wants to move to [place] and get a 1 bedroom apartment.  Patient has not been sleeping very well.  Has some ulcers of his lower extremity  Objective: Vitals:   11/06/19 0425 11/06/19 0812  BP: 92/60 102/72  Pulse: (!) 106 95  Resp: 16 20  Temp: 97.8 F (36.6 C) 98.3 F (36.8 C)  SpO2: 98% 97%    Intake/Output Summary (Last 24 hours) at 11/06/2019 1519 Last data filed at 11/06/2019 0310 Gross per 24 hour  Intake 50 ml  Output 1250 ml  Net -1200 ml   Filed Weights   11/05/19 1112 11/05/19 2101 11/06/19 0500  Weight: 113.4 kg 131 kg 131.5 kg    ROS: Review of Systems  Constitutional: Negative for fever.  Eyes: Negative for blurred vision.  Respiratory: Negative for cough and shortness of breath.   Cardiovascular: Negative for chest pain.  Gastrointestinal: Negative for abdominal pain, nausea and vomiting.  Genitourinary: Negative for dysuria.  Musculoskeletal: Negative for joint pain.  Neurological: Negative for dizziness.   Exam: Physical Exam  HENT:  Nose: No mucosal edema.  Mouth/Throat: No oropharyngeal exudate or posterior oropharyngeal edema.  Eyes: Conjunctivae and lids are normal.  Neck: Carotid bruit is not present.  Cardiovascular: S1 normal and S2 normal. Exam reveals no gallop.  No murmur heard. Respiratory: No respiratory distress. He has decreased breath sounds in the right lower field and the left lower field. He has no wheezes. He has no rhonchi. He has no rales.  GI: Soft. Bowel sounds are normal. He exhibits distension. There is no abdominal tenderness.  Genitourinary:    Genitourinary Comments: 3+ scrotal edema   Musculoskeletal:      Right ankle: Swelling present.     Left ankle: Swelling present.  Lymphadenopathy:    He has no cervical adenopathy.  Neurological: He is alert. No cranial nerve deficit.  Able to straight leg raise  Skin: Skin is warm. Nails show no clubbing.  Right lower extremity has a small little ulceration and looks like a larger blister.  Chronic lower extremity skin discoloration. Left lower extremity wound covered  Psychiatric:  Answers yes or no questions appropriately.      Data Reviewed: Basic Metabolic Panel: Recent Labs  Lab 11/05/19 1118 11/06/19 0455  NA 137 140  K 4.1 3.7  CL 92* 94*  CO2 38* 39*  GLUCOSE 111* 128*  BUN 32* 30*  CREATININE 1.22 1.11  CALCIUM 9.4 9.2   Liver Function Tests: Recent Labs  Lab 11/05/19 1118  AST 29  ALT 19  ALKPHOS 127*  BILITOT 1.3*  PROT 6.3*  ALBUMIN 3.0*    Recent Labs  Lab 11/06/19 1104  AMMONIA 39*   CBC: Recent Labs  Lab 11/05/19 1118 11/06/19 0455  WBC 5.2 4.6  NEUTROABS 2.7  --   HGB 8.2* 7.6*  HCT 28.5* 26.8*  MCV 91.3 91.2  PLT 202 179   BNP (last 3 results) Recent Labs    10/11/19 1249  BNP 1,906.0*    CBG: Recent Labs  Lab 11/05/19 1732 11/06/19 0815  GLUCAP 107* 103*    Recent Results (from the past 240 hour(s))  SARS Coronavirus 2 by RT PCR (hospital  order, performed in [facility] hospital lab) Nasopharyngeal Nasopharyngeal Swab     Status: None   Collection Time: 11/05/19  1:09 PM   Specimen: Nasopharyngeal Swab  Result Value Ref Range Status   SARS Coronavirus 2 NEGATIVE NEGATIVE Final    Comment: (NOTE) SARS-CoV-2 target nucleic acids are NOT DETECTED. The SARS-CoV-2 RNA is generally detectable in upper and lower respiratory specimens during the acute phase of infection. The lowest concentration of SARS-CoV-2 viral copies this assay can detect is 250 copies / mL. A negative result does not preclude SARS-CoV-2 infection and should not be used as the sole basis for treatment or  other patient management decisions.  A negative result may occur with improper specimen collection / handling, submission of specimen other than nasopharyngeal swab, presence of viral mutation(s) within the areas targeted by this assay, and inadequate number of viral copies (<250 copies / mL). A negative result must be combined with clinical observations, patient history, and epidemiological information. Fact Sheet for Patients:   StrictlyIdeas.no Fact Sheet for Healthcare Providers: BankingDealers.co.za This test is not yet approved or cleared  by the [place] FDA and has been authorized for detection and/or diagnosis of SARS-CoV-2 by FDA under an Emergency Use Authorization (EUA).  This EUA will remain in effect (meaning this test can be used) for the duration of the COVID-19 declaration under Section 564(b)(1) of the Act, 21 U.S.C. section 360bbb-3(b)(1), unless the authorization is terminated or revoked sooner. Performed at [facility], [address] [place], [place] [address]      Studies: CT HEAD WO CONTRAST  Result Date: 11/06/2019 CLINICAL DATA:  Altered behavior/mental status. EXAM: CT HEAD WITHOUT CONTRAST TECHNIQUE: Contiguous axial images were obtained from the base of the skull through the vertex without intravenous contrast. COMPARISON:  None. FINDINGS: Brain: No evidence of acute infarction, hemorrhage, hydrocephalus, extra-axial collection or mass lesion/mass effect. Vascular: No hyperdense vessel or unexpected calcification. Skull: Normal. Negative for fracture or focal lesion. Sinuses/Orbits: Visualized globes and orbits are unremarkable. The visualized sinuses are clear. Other: None. IMPRESSION: Normal unenhanced CT scan of the brain for age. Electronically Signed   By: Lajean Manes M.D.   On: 11/06/2019 12:00    Scheduled Meds: . bumetanide (BUMEX) IV  1 mg Intravenous BID  . dextroamphetamine  15 mg  Oral TID  . gabapentin  400 mg Oral QID  . guaiFENesin  600 mg Oral BID  . insulin aspart  0-20 Units Subcutaneous TID WC  . lactulose  30 g Oral Daily  . modafinil  100 mg Oral TID  . QUEtiapine  25 mg Oral QHS  . sodium chloride flush  3 mL Intravenous Q12H   Continuous Infusions: . sodium chloride      Assessment/Plan:  1. Chronic diastolic congestive heart failure with anasarca and scrotal edema.  Moderate aortic stenosis seen on echo along with severe pulmonary hypertension.  Patient was placed on IV Bumex.  Add low-dose Toprol-XL at night 2. Acute metabolic encephalopathy.  Ammonia level slightly elevated so could be hepatic encephalopathy.  Will start low-dose lactulose once a day.  CT scan of the head negative.  B12 level normal.  RPR and TSH pending. 3. 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  I will see if there is enough fluid in the abdomen to draw off with paracentesis tomorrow morning. 4. Insomnia at night trial of Seroquel 5. Weakness physical therapy consultation 6. COPD with chronic hypoxic respiratory failure on oxygen  7. Neuropathy on gabapentin 8. Moderate protein calorie malnutrition 9. Chronic venous stasis ulcers right greater than left no signs of infection 10. Chronic atrial fibrillation.  Add Toprol-XL. 11. Get ultrasound of the lower extremity       Code Status:     Code Status Orders  (From admission, onward)         Start     Ordered   11/05/19 1252  Do not attempt resuscitation (DNR)  Continuous    Question Answer Comment  In the event of cardiac or respiratory ARREST Do not call a "code blue"   In the event of cardiac or respiratory ARREST Do not perform Intubation, CPR, defibrillation or ACLS   In the event of cardiac or respiratory ARREST Use medication by any route, position, wound care, and other measures to relive pain and suffering. May use oxygen, suction and manual treatment of airway obstruction as  needed for comfort.      11/05/19 1254        Code Status History    Date Active Date Inactive Code Status Order ID Comments User Context   10/12/2019 1120 10/18/2019 2247 DNR [id number]  Philis Pique, NP Inpatient   10/11/2019 1523 10/12/2019 1120 Full Code [id number]  Max Sane, MD ED   06/24/2016 0451 06/27/2016 2010 Full Code [id number]  Harrie Foreman, MD Inpatient   05/17/2016 1001 05/19/2016 1752 Full Code [id number]  Saundra Shelling, MD Inpatient   Advance Care Planning Activity     Family Communication: Spoke with wife at the bedside Disposition Plan: Status is: Inpatient  Dispo: The patient is from: Home              Anticipated d/c is to: Home with hospice              Anticipated d/c date is: 11/09/2019              Patient currently receiving IV diuretics for anasarca and scrotal swelling.  Patient's ammonia level is also a little high in treating for hepatic encephalopathy  Time spent: 29 minutes  [place]

## 2019-11-07 ENCOUNTER — Inpatient Hospital Stay

## 2019-11-07 DIAGNOSIS — J9611 Chronic respiratory failure with hypoxia: Secondary | ICD-10-CM

## 2019-11-07 DIAGNOSIS — R531 Weakness: Secondary | ICD-10-CM

## 2019-11-07 DIAGNOSIS — D649 Anemia, unspecified: Secondary | ICD-10-CM

## 2019-11-07 LAB — BASIC METABOLIC PANEL
Anion gap: 8 (ref 5–15)
BUN: 31 mg/dL — ABNORMAL HIGH (ref 8–23)
CO2: 39 mmol/L — ABNORMAL HIGH (ref 22–32)
Calcium: 9.4 mg/dL (ref 8.9–10.3)
Chloride: 93 mmol/L — ABNORMAL LOW (ref 98–111)
Creatinine, Ser: 1.26 mg/dL — ABNORMAL HIGH (ref 0.61–1.24)
GFR calc Af Amer: >60 mL/min (ref >60)
GFR calc non Af Amer: 54 mL/min — ABNORMAL LOW (ref >60)
Glucose, Bld: 124 mg/dL — ABNORMAL HIGH (ref 70–99)
Potassium: 3.7 mmol/L (ref 3.5–5.1)
Sodium: 140 mmol/L (ref 135–145)

## 2019-11-07 LAB — PATHOLOGIST SMEAR REVIEW

## 2019-11-07 LAB — BODY FLUID CELL COUNT WITH DIFFERENTIAL
Eos, Fluid: 0 %
Lymphs, Fluid: 41 %
Monocyte-Macrophage-Serous Fluid: 30 %
Neutrophil Count, Fluid: 29 %
Total Nucleated Cell Count, Fluid: 467 cu mm

## 2019-11-07 LAB — GLUCOSE, CAPILLARY
Glucose-Capillary: 105 mg/dL — ABNORMAL HIGH (ref 70–99)
Glucose-Capillary: 150 mg/dL — ABNORMAL HIGH (ref 70–99)
Glucose-Capillary: 112 mg/dL — ABNORMAL HIGH (ref 70–99)
Glucose-Capillary: 152 mg/dL — ABNORMAL HIGH (ref 70–99)

## 2019-11-07 LAB — PROTEIN, PLEURAL OR PERITONEAL FLUID: Total protein, fluid: <3 g/dL

## 2019-11-07 LAB — TSH: TSH: 3.021 u[IU]/mL (ref 0.350–4.500)

## 2019-11-07 LAB — GLUCOSE, PLEURAL OR PERITONEAL FLUID: Glucose, Fluid: 121 mg/dL

## 2019-11-07 LAB — RPR: RPR Ser Ql: NONREACTIVE

## 2019-11-07 LAB — FERRITIN: Ferritin: 30 ng/mL (ref 24–336)

## 2019-11-07 MED ORDER — DEXTROAMPHETAMINE SULFATE ER 5 MG PO CP24
15.0000 mg | ORAL_CAPSULE | Freq: Two times a day (BID) | ORAL | Status: DC
  Administered 2019-11-07 – 2019-11-08 (×3): 15 mg via ORAL
  Filled 2019-11-07 (×3): qty 3

## 2019-11-07 MED ORDER — LACTULOSE 10 GM/15ML PO SOLN
30.0000 g | Freq: Two times a day (BID) | ORAL | Status: DC
  Administered 2019-11-07 – 2019-11-08 (×2): 30 g via ORAL
  Filled 2019-11-07 (×2): qty 60

## 2019-11-07 MED ORDER — SODIUM CHLORIDE 0.9 % IV SOLN
2.0000 g | INTRAVENOUS | Status: DC
  Administered 2019-11-07: 2 g via INTRAVENOUS
  Filled 2019-11-07: qty 20
  Filled 2019-11-07: qty 2

## 2019-11-07 NOTE — Procedures (Signed)
PROCEDURE SUMMARY:  Successful image-guided paracentesis from the right lateral abdomen.  Yielded 2.0 liters of clear, light amber fluid.  No immediate complications.  EBL: zero Patient tolerated well.   Specimen was sent for labs.  Please see imaging section of Epic for full dictation.  Joaquim Nam PA-C 11/07/2019 11:45 AM

## 2019-11-07 NOTE — Progress Notes (Signed)
Patient ID: Miguel Palmer, male   DOB: 08-28-1940, 79 y.o.   MRN: [id number] [facility] PROGRESS NOTE  Miguel Palmer GUY:[id number] DOB: 1940/12/19 DOA: 11/05/2019 PCP: [name], MD  HPI/Subjective: When I came by earlier this morning he was sleeping.  When I saw him awake I came into the room.  He stated he slept last night.  Slight cough.  Still swollen but not as much.  Objective: Vitals:   11/07/19 1154 11/07/19 1207  BP: 95/60 98/76  Pulse: (!) 101 96  Resp:  20  Temp:  (!) 97.3 F (36.3 C)  SpO2: 99% 96%    Filed Weights   11/05/19 2101 11/06/19 0500 11/07/19 0447  Weight: 131 kg 131.5 kg 131.7 kg    ROS: Review of Systems  Constitutional: Negative for fever.  Respiratory: Positive for cough. Negative for shortness of breath.   Cardiovascular: Negative for chest pain.  Gastrointestinal: Negative for abdominal pain.  Genitourinary: Negative for dysuria.  Musculoskeletal: Negative for joint pain.  Neurological: Negative for dizziness.   Exam: Physical Exam  HENT:  Nose: No mucosal edema.  Mouth/Throat: No oropharyngeal exudate or posterior oropharyngeal edema.  Eyes: Conjunctivae and lids are normal.  Cardiovascular: S1 normal and S2 normal. An irregularly irregular rhythm present. Exam reveals no gallop.  No murmur heard. Respiratory: No respiratory distress. He has decreased breath sounds in the right lower field and the left lower field. He has no wheezes. He has no rhonchi. He has no rales.  GI: Soft. Bowel sounds are normal. He exhibits distension. There is no abdominal tenderness.  Genitourinary:    Genitourinary Comments: 2+ scrotal edema   Musculoskeletal:     Right ankle: Swelling present.     Left ankle: Swelling present.  Lymphadenopathy:    He has no cervical adenopathy.  Neurological: He is alert. No cranial nerve deficit.  Skin: Skin is warm. Nails show no clubbing.  Right lower extremity has a small little ulceration and  looks like a larger blister.  Chronic lower extremity skin discoloration. Left lower extremity wound covered  Psychiatric:  Answers yes or no questions appropriately.      Data Reviewed: Basic Metabolic Panel: Recent Labs  Lab 11/05/19 1118 11/06/19 0455 11/07/19 0358  NA 137 140 140  K 4.1 3.7 3.7  CL 92* 94* 93*  CO2 38* 39* 39*  GLUCOSE 111* 128* 124*  BUN 32* 30* 31*  CREATININE 1.22 1.11 1.26*  CALCIUM 9.4 9.2 9.4   Liver Function Tests: Recent Labs  Lab 11/05/19 1118  AST 29  ALT 19  ALKPHOS 127*  BILITOT 1.3*  PROT 6.3*  ALBUMIN 3.0*    Recent Labs  Lab 11/06/19 1104  AMMONIA 39*   CBC: Recent Labs  Lab 11/05/19 1118 11/06/19 0455  WBC 5.2 4.6  NEUTROABS 2.7  --   HGB 8.2* 7.6*  HCT 28.5* 26.8*  MCV 91.3 91.2  PLT 202 179   BNP (last 3 results) Recent Labs    10/11/19 1249  BNP 1,906.0*    CBG: Recent Labs  Lab 11/06/19 0815 11/06/19 1657 11/06/19 2126 11/07/19 0754 11/07/19 1209  GLUCAP 103* 139* 93 105* 150*    Recent Results (from the past 240 hour(s))  SARS Coronavirus 2 by RT PCR (hospital order, performed in [facility] hospital lab) Nasopharyngeal Nasopharyngeal Swab     Status: None   Collection Time: 11/05/19  1:09 PM   Specimen: Nasopharyngeal Swab  Result Value Ref Range Status  SARS Coronavirus 2 NEGATIVE NEGATIVE Final    Comment: (NOTE) SARS-CoV-2 target nucleic acids are NOT DETECTED. The SARS-CoV-2 RNA is generally detectable in upper and lower respiratory specimens during the acute phase of infection. The lowest concentration of SARS-CoV-2 viral copies this assay can detect is 250 copies / mL. A negative result does not preclude SARS-CoV-2 infection and should not be used as the sole basis for treatment or other patient management decisions.  A negative result may occur with improper specimen collection / handling, submission of specimen other than nasopharyngeal swab, presence of viral mutation(s) within  the areas targeted by this assay, and inadequate number of viral copies (<250 copies / mL). A negative result must be combined with clinical observations, patient history, and epidemiological information. Fact Sheet for Patients:   StrictlyIdeas.no Fact Sheet for Healthcare Providers: BankingDealers.co.za This test is not yet approved or cleared  by the [place] FDA and has been authorized for detection and/or diagnosis of SARS-CoV-2 by FDA under an Emergency Use Authorization (EUA).  This EUA will remain in effect (meaning this test can be used) for the duration of the COVID-19 declaration under Section 564(b)(1) of the Act, 21 U.S.C. section 360bbb-3(b)(1), unless the authorization is terminated or revoked sooner. Performed at [facility], [address], [place], [place] [address]      Studies: CT HEAD WO CONTRAST  Result Date: 11/06/2019 CLINICAL DATA:  Altered behavior/mental status. EXAM: CT HEAD WITHOUT CONTRAST TECHNIQUE: Contiguous axial images were obtained from the base of the skull through the vertex without intravenous contrast. COMPARISON:  None. FINDINGS: Brain: No evidence of acute infarction, hemorrhage, hydrocephalus, extra-axial collection or mass lesion/mass effect. Vascular: No hyperdense vessel or unexpected calcification. Skull: Normal. Negative for fracture or focal lesion. Sinuses/Orbits: Visualized globes and orbits are unremarkable. The visualized sinuses are clear. Other: None. IMPRESSION: Normal unenhanced CT scan of the brain for age. Electronically Signed   By: Lajean Manes M.D.   On: 11/06/2019 12:00   US Venous Img Lower Bilateral (DVT)  Result Date: 11/07/2019 CLINICAL DATA:  Bilateral lower extremity swelling EXAM: BILATERAL LOWER EXTREMITY VENOUS DOPPLER ULTRASOUND TECHNIQUE: Gray-scale sonography with graded compression, as well as color Doppler and duplex ultrasound were performed to  evaluate the lower extremity deep venous systems from the level of the common femoral vein and including the common femoral, femoral, profunda femoral, popliteal and calf veins including the posterior tibial, peroneal and gastrocnemius veins when visible. The superficial great saphenous vein was also interrogated. Spectral Doppler was utilized to evaluate flow at rest and with distal augmentation maneuvers in the common femoral, femoral and popliteal veins. COMPARISON:  None. FINDINGS: RIGHT LOWER EXTREMITY Common Femoral Vein: No evidence of thrombus. Normal compressibility, respiratory phasicity and response to augmentation. Saphenofemoral Junction: No evidence of thrombus. Normal compressibility and flow on color Doppler imaging. Profunda Femoral Vein: No evidence of thrombus. Normal compressibility and flow on color Doppler imaging. Femoral Vein: No evidence of thrombus. Normal compressibility, respiratory phasicity and response to augmentation. Popliteal Vein: No evidence of thrombus. Normal compressibility, respiratory phasicity and response to augmentation. Calf Veins: Limited visualization because of body habitus and peripheral edema. Posterior tibial vein appears patent. Peroneal vein not visualized. No gross thrombus evident. LEFT LOWER EXTREMITY Common Femoral Vein: No evidence of thrombus. Normal compressibility, respiratory phasicity and response to augmentation. Saphenofemoral Junction: No evidence of thrombus. Normal compressibility and flow on color Doppler imaging. Profunda Femoral Vein: No evidence of thrombus. Normal compressibility and flow on color Doppler  imaging. Femoral Vein: No evidence of thrombus. Normal compressibility, respiratory phasicity and response to augmentation. Popliteal Vein: No evidence of thrombus. Normal compressibility, respiratory phasicity and response to augmentation. Calf Veins: Similar limited visualization because of edema and body habitus. Posterior tibial vein  appears patent. Peroneal vein not visualized. No gross thrombus evident. IMPRESSION: No significant lower extremity DVT in either extremity. Limited assessment of the calf veins because of body habitus and edema. Electronically Signed   By: Jerilynn Mages.  Shick M.D.   On: 11/07/2019 12:11   US Paracentesis  Result Date: 11/07/2019 INDICATION: Patient with history of CHF, cirrhosis, portal venous hypertension and noted to have diffuse anasarca. Request to IR for diagnostic and therapeutic paracentesis. EXAM: ULTRASOUND GUIDED DIAGNOSTIC AND THERAPEUTIC PARACENTESIS MEDICATIONS: 10 mL 1% lidocaine COMPLICATIONS: None immediate. PROCEDURE: Informed written consent was obtained from the patient after a discussion of the risks, benefits and alternatives to treatment. A timeout was performed prior to the initiation of the procedure. Initial ultrasound scanning demonstrates a moderate amount of ascites within the right lower abdominal quadrant. The right lower abdomen was prepped and draped in the usual sterile fashion. 1% lidocaine was used for local anesthesia. Following this, a 6 Fr Safe-T-Centesis catheter was introduced. An ultrasound image was saved for documentation purposes. The paracentesis was performed. The catheter was removed and a dressing was applied. The patient tolerated the procedure well without immediate post procedural complication. FINDINGS: A total of approximately 2.0 L of clear, light amber fluid was removed. Samples were sent to the laboratory as requested by the clinical team. IMPRESSION: Successful ultrasound-guided paracentesis yielding 2.0 liters of peritoneal fluid. Read by Candiss Norse, PA-C Electronically Signed   By: Markus Daft M.D.   On: 11/07/2019 12:28    Scheduled Meds: . bumetanide (BUMEX) IV  1 mg Intravenous BID  . dextroamphetamine  15 mg Oral BID WC  . gabapentin  400 mg Oral QID  . guaiFENesin  600 mg Oral BID  . insulin aspart  0-20 Units Subcutaneous TID WC  . lactulose   30 g Oral BID  . metoprolol succinate  12.5 mg Oral QHS  . modafinil  100 mg Oral TID  . QUEtiapine  25 mg Oral QHS  . sodium chloride flush  3 mL Intravenous Q12H   Continuous Infusions: . sodium chloride    . cefTRIAXone (ROCEPHIN)  IV      Assessment/Plan:  1. Chronic diastolic congestive heart failure with anasarca and scrotal edema.  Moderate aortic stenosis seen on echo along with severe pulmonary hypertension.  Continue on IV Bumex and low-dose Toprol-XL at night 2. Acute metabolic encephalopathy.  Ammonia level slightly elevated so could be hepatic encephalopathy.  Will increase lactulose to twice daily dosing.  CT scan of the head negative.  B12 level and TSH normal.  RPR pending. 3. 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  Paracentesis drew off 2 L of fluid.  Since white blood cell count in the fluid is greater than 250 I will start empiric Rocephin and follow-up culture.  Hopefully if blood pressure allows can start spironolactone. 4. Insomnia.  Slept with Seroquel last night.  Get rid of evening dose of narcolepsy medication. 5. Weakness. physical therapy consultation 6. 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 Neuropathy on gabapentin 8. Moderate protein calorie malnutrition 9. Chronic venous stasis ulcers right greater than left no signs of infection 10. Chronic atrial fibrillation.  Add Toprol-XL.  Hesitant on full dose anticoagulation.  11. Anemia, unspecified.  Check a ferritin level     Code Status:     Code Status Orders  (From admission, onward)         Start     Ordered   11/05/19 1252  Do not attempt resuscitation (DNR)  Continuous    Question Answer Comment  In the event of cardiac or respiratory ARREST Do not call a "code blue"   In the event of cardiac or respiratory ARREST Do not perform Intubation, CPR, defibrillation or ACLS   In the event of cardiac or respiratory ARREST Use medication by any  route, position, wound care, and other measures to relive pain and suffering. May use oxygen, suction and manual treatment of airway obstruction as needed for comfort.      11/05/19 1254        Code Status History    Date Active Date Inactive Code Status Order ID Comments User Context   10/12/2019 1120 10/18/2019 2247 DNR [id number]  Philis Pique, NP Inpatient   10/11/2019 1523 10/12/2019 1120 Full Code [id number]  Max Sane, MD ED   06/24/2016 0451 06/27/2016 2010 Full Code [id number]  Harrie Foreman, MD Inpatient   05/17/2016 1001 05/19/2016 1752 Full Code [id number]  Saundra Shelling, MD Inpatient   Advance Care Planning Activity     Family Communication: Spoke with wife at the bedside Disposition Plan: Status is: Inpatient  Dispo: The patient is from: Home              Anticipated d/c is to: Home with hospice.               Anticipated d/c date is: 11/09/2019              Patient currently receiving IV diuretics for anasarca and scrotal swelling.  Had a paracentesis today.  Check laboratory data tomorrow.  Continue to monitor closely.  Patient's wife interested in more help at home.  Spoke with transitional care team to look into rehabs that would allow the patient's wife to visit.  Time spent: 28 minutes  [place]

## 2019-11-07 NOTE — Progress Notes (Signed)
Manufacturing engineer [facility]) Hospital Liaison Note: Patient is currently followed by [facility] hospice services at home with a hospice diagnosis of chronic respiratory failure an hypertensive heart disease. He is a DNR code with out of facility DNR in place in the home. Patient came to the [facility] ED on 5/15 for evaluation of increased scrotal swelling. He has been admitted for AMS/anasarca. This is a related admission per hospice physician Dr. Gilford Rile.  Of note patient underwent a paracentesis this morning with 2 liters drawn off.  Visit made, patient seen lying in bed, appeared to be sleeping. He had received Seroquel 25 mg last night  as he has had difficulty sleeping. No signs of discomfort noted. Oxygen in place at 3.5 liters via nasal canula, which is base line for patient. Staff RN Janett Billow present and performing wound care to right lower leg. Mrs. Jeannine Kitten Raquel Sarna) present at bedside.  She relayed the continued decline she has noted over the past few weeks. Mr. Bachar is no longer able to ambulate, at best he may move himself a few feet sitting in his rollator walker. She has also seen changes in his mental status, he is not able to understand his limitations and has tried to get up out of bed with out help. She feels she cannot leave him alone and is hopeful to arrange  paid caregiver assistance and stated she does have information that was given to her by her hospice team. We discussed this additional help at length. She is also hopeful that patient's sister will be coming from [place] to assist in his care.  Mrs. Gondek did briefly discuss STR as an option, but recognizes that due to her husbands functional decline he would not benefit from nor want  this. Emotional support given, patient remained asleep through out the visit. Will continue to follow and provide support and update hospice team.  VS: 97.6 oral, 112, 106/66, 96% on 3.5 liters  Abnormal labs: 5/17 Chloride 93, CO2 39, Bun 31,  Creat 1.26, GFR 54 Hb 7.6 on 5/16  Imaging/Procedures:  INDICATION: Patient with history of CHF, cirrhosis,portal venous hypertension and noted to have diffuse anasarca. Request to IR for diagnostic and therapeutic paracentesis.  EXAM: ULTRASOUND GUIDED DIAGNOSTIC AND THERAPEUTIC PARACENTESIS  IMPRESSION: Successful ultrasound-guided paracentesis yielding 2.0 liters of peritoneal fluid. Read by Candiss Norse, PA-C Electronically Signed  By: Markus Daft M.D. On: 11/07/2019 12:28   CLINICAL DATA:  Altered behavior/mental status. EXAM: CT HEAD WITHOUT CONTRAST IMPRESSION: Normal unenhanced CT scan of the brain for age. Electronically Signed By: Lajean Manes M.D. On: 11/06/2019 12:00  IV/ /PRN medications: Rocephin 2 gm/every q 24 hrs IV New medications: Seroquel 25 mg at bedtime Lactulose 30 gm BID  Assessment/Plan:  1. Chronic diastolic congestive heart failure with anasarca and scrotal edema.  Moderate aortic stenosis seen on echo along with severe pulmonary hypertension.  Continue on IV Bumex and low-dose Toprol-XL at night 2. Acute metabolic encephalopathy.  Ammonia level slightly elevated so could be hepatic encephalopathy.  Will increase lactulose to twice daily dosing.  CT scan of the head negative.  B12 level and TSH normal.  RPR pending. 3. 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  Paracentesis drew off 2 L of fluid.  Since white blood cell count in the fluid is greater than 250 I will start empiric Rocephin and follow-up culture.  Hopefully if blood pressure allows can start spironolactone. 4. Insomnia.  Slept with Seroquel last night.  Get rid of evening dose of narcolepsy medication. 5. Weakness. physical therapy consultation 6. 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 Neuropathy on gabapentin 8. Moderate protein calorie malnutrition 9. Chronic venous stasis ulcers right greater than left no signs of  infection 10. Chronic atrial fibrillation.  Add Toprol-XL.  Hesitant on full dose anticoagulation. 11. Anemia, unspecified.  Check a ferritin level   Family Communication: Spoke with wife at the bedside Disposition Plan: Status is: Inpatient  Dispo: The patient is from: Home  Anticipated d/c is to: Home with hospice.  Anticipated d/c date is: 11/09/2019  Patient currently receiving IV diuretics for anasarca and scrotal swelling.  Had a paracentesis today.  Check laboratory data tomorrow.  Continue to monitor closely.  Patient's wife interested in more help at home.  Spoke with transitional care team to look into rehabs that would allow the patient's wife to visit.  Patient current home medication list in place in hospital chart. Thank you.  Flo Shanks RN, BSN, [place] [phone]

## 2019-11-07 NOTE — Evaluation (Signed)
Physical Therapy Evaluation Patient Details Name: Miguel Palmer MRN: [id number] DOB: 01/25/1941 Today's Date: 11/07/2019   History of Present Illness  Miguel Palmer is an 57yoM who comes to [facility] after worseing of scotal edema to 15-inches, acute weight gain. Pt was here 3 weeks ago, DC to home with hospice care.  Clinical Impression  Pt admitted with above diagnosis. Pt currently with functional limitations due to the deficits listed below (see "PT Problem List"). Upon entry, pt off floor for US/paracentesis. Recent functional status obtained from wife, as updates to home setup. Wife reports he was seen by PT at home briefly, given 4 exercises to do, but has not been interested or compliant at home. Wife reports pt has continued to decline since prior admission, she estimates she provides maxA for supine to sitting mobility at home, and "90% of labor" for transfers from bed to rollator. Wife reports that patient has been nonambulatory since DC, for mobility needs OOB will sit on his rollator and propel self with feet- pt has a WC but "does not like it" per wife. Wife reports pt is minimally motivated to get OOB, but toileting is one motivation- however the standard sized BSC does not work for the patient's body size. Wife reports pt needs an elongated BSC with greater depth of seat to accommodate habitus and genitals. Author returns later in day. Pt somnolent upon entry, RN reports this is likely due to new medication. Pt arouses momentarily when stimulated, moves arms ad lib, moves legs ad lib but minimally. Pt had visibly worse ABD distention since last seen by Pryor Curia, wife confirms this. When asked, wife reports the goal of care is to keep the patient at home until time of death, she believes he would not desire facility placement. Author explains that based on his current level of assistance, the patient/caregiver would benefit from having a hoyer lift in the home to facilitate OOB to chair. Pt  also needs an appropriately sized toilet to avoid toileting in bed. Pt is agreeable to this, reports the patient can be moved to the living room where there would be adequate room for hoyer lift. This would allow the patient to remain in the home and be cared for by his wife. Patient is at functional baseline, all education completed, and time is given to address all questions/concerns. No additional skilled PT services needed at this time, PT signing off.      Follow Up Recommendations Other (comment)(return to home with hospice)    Equipment Recommendations  Other (comment)(Managed by hospice; will need a hoyer lift an elongated BSC.)    Recommendations for Other Services       Precautions / Restrictions Precautions Precautions: Fall Restrictions Weight Bearing Restrictions: No      Mobility  Bed Mobility Overal bed mobility: (Not assessed, pt too somnolent to participate.)                Transfers                    Ambulation/Gait                Stairs            Wheelchair Mobility    Modified Rankin (Stroke Patients Only)       Balance  Pertinent Vitals/Pain Pain Assessment: No/denies pain    Home Living Family/patient expects to be discharged to:: Private residence Living Arrangements: Spouse/significant other Available Help at Discharge: Family;Available 24 hours/day Type of Home: Apartment Home Access: Level entry     Home Layout: Able to live on main level with bedroom/bathroom;Multi-level;Laundry or work area in basement;Full bath on main level Home Equipment: Cane - single point;Walker - 4 wheels;Wheelchair - Brewing technologist;Toilet riser;Grab bars - tub/shower;Hospital bed;Bedside commode Additional Comments: BSC at home too small; has 2 rollators which he uses to self propel, he does not like the WC.    Prior Function Level of Independence: Needs assistance    Gait / Transfers Assistance Needed: In last few weeks pt has required MaxA for bed mobility, TotalA for transfers, nonambulatory (uses Rollator for mobility (self propels while seated)).           Hand Dominance        Extremity/Trunk Assessment                Communication   Communication: HOH  Cognition Arousal/Alertness: Lethargic;Suspect due to medications Behavior During Therapy: [employer] for tasks assessed/performed Overall Cognitive Status: History of cognitive impairments - at baseline                                        General Comments      Exercises     Assessment/Plan    PT Assessment All further PT needs can be met in the next venue of care  PT Problem List Decreased strength;Decreased activity tolerance;Decreased cognition;Decreased mobility       PT Treatment Interventions      PT Goals (Current goals can be found in the Care Plan section)  Acute Rehab PT Goals PT Goal Formulation: All assessment and education complete, DC therapy    Frequency     Barriers to discharge        Co-evaluation               AM-PAC PT "6 Clicks" Mobility  Outcome Measure Help needed turning from your back to your side while in a flat bed without using bedrails?: Total Help needed moving from lying on your back to sitting on the side of a flat bed without using bedrails?: Total Help needed moving to and from a bed to a chair (including a wheelchair)?: Total Help needed standing up from a chair using your arms (e.g., wheelchair or bedside chair)?: Total Help needed to walk in hospital room?: Total Help needed climbing 3-5 steps with a railing? : Total 6 Click Score: 6    End of Session Equipment Utilized During Treatment: Oxygen Activity Tolerance: Patient limited by lethargy Patient left: in bed;with family/visitor present;with bed alarm set Nurse Communication: Mobility status PT Visit Diagnosis: Other abnormalities of gait and  mobility (R26.89);Difficulty in walking, not elsewhere classified (R26.2)    Time: 0940-7680 PT Time Calculation (min) (ACUTE ONLY): 31 min   Charges:   PT Evaluation $PT Eval Low Complexity: 1 Low PT Treatments $Self Care/Home Management: 8-22        5:39 PM, 11/07/19 Etta Grandchild, PT, DPT Physical Therapist - [facility]  [phone] ([place])   [place] 11/07/2019, 5:27 PM

## 2019-11-07 NOTE — Progress Notes (Signed)
Yellow MEWs due to low BP/ pts baseline/ asymptotic/ MD aware/  Order to give meds/ will monitor.

## 2019-11-08 DIAGNOSIS — D509 Iron deficiency anemia, unspecified: Secondary | ICD-10-CM

## 2019-11-08 LAB — BASIC METABOLIC PANEL
Anion gap: 11 (ref 5–15)
BUN: 31 mg/dL — ABNORMAL HIGH (ref 8–23)
CO2: 37 mmol/L — ABNORMAL HIGH (ref 22–32)
Calcium: 9.2 mg/dL (ref 8.9–10.3)
Chloride: 92 mmol/L — ABNORMAL LOW (ref 98–111)
Creatinine, Ser: 1.06 mg/dL (ref 0.61–1.24)
GFR calc Af Amer: >60 mL/min (ref >60)
GFR calc non Af Amer: >60 mL/min (ref >60)
Glucose, Bld: 111 mg/dL — ABNORMAL HIGH (ref 70–99)
Potassium: 3.7 mmol/L (ref 3.5–5.1)
Sodium: 140 mmol/L (ref 135–145)

## 2019-11-08 LAB — CBC
HCT: 28.1 % — ABNORMAL LOW (ref 39.0–52.0)
Hemoglobin: 8 g/dL — ABNORMAL LOW (ref 13.0–17.0)
MCH: 25.8 pg — ABNORMAL LOW (ref 26.0–34.0)
MCHC: 28.5 g/dL — ABNORMAL LOW (ref 30.0–36.0)
MCV: 90.6 fL (ref 80.0–100.0)
Platelets: 155 10*3/uL (ref 150–400)
RBC: 3.1 MIL/uL — ABNORMAL LOW (ref 4.22–5.81)
RDW: 20.3 % — ABNORMAL HIGH (ref 11.5–15.5)
WBC: 4.5 10*3/uL (ref 4.0–10.5)
nRBC: 0 % (ref 0.0–0.2)

## 2019-11-08 LAB — GLUCOSE, CAPILLARY
Glucose-Capillary: 95 mg/dL (ref 70–99)
Glucose-Capillary: 115 mg/dL — ABNORMAL HIGH (ref 70–99)
Glucose-Capillary: 113 mg/dL — ABNORMAL HIGH (ref 70–99)

## 2019-11-08 LAB — PROTEIN, BODY FLUID (OTHER): Total Protein, Body Fluid Other: 2.5 g/dL

## 2019-11-08 MED ORDER — TORSEMIDE 20 MG PO TABS: 20.0000 mg | ORAL_TABLET | Freq: Two times a day (BID) | ORAL | 0 refills | Status: DC

## 2019-11-08 MED ORDER — SPIRONOLACTONE 25 MG PO TABS: 25.0000 mg | ORAL_TABLET | Freq: Every day | ORAL | Status: DC

## 2019-11-08 MED ORDER — MODAFINIL 100 MG PO TABS: 100.0000 mg | ORAL_TABLET | Freq: Two times a day (BID) | ORAL | Status: DC

## 2019-11-08 MED ORDER — GABAPENTIN 400 MG PO CAPS: 400.0000 mg | ORAL_CAPSULE | Freq: Two times a day (BID) | ORAL | Status: DC

## 2019-11-08 MED ORDER — FERROUS SULFATE 325 (65 FE) MG PO TBEC
325.0000 mg | DELAYED_RELEASE_TABLET | Freq: Every day | ORAL | 0 refills | Status: AC
Start: 2019-11-09 — End: 2020-11-08

## 2019-11-08 MED ORDER — SPIRONOLACTONE 25 MG PO TABS: 25.0000 mg | ORAL_TABLET | Freq: Every day | ORAL | 0 refills | Status: DC

## 2019-11-08 MED ORDER — QUETIAPINE FUMARATE 25 MG PO TABS: 25.0000 mg | ORAL_TABLET | Freq: Every evening | ORAL | 0 refills | Status: DC | PRN

## 2019-11-08 MED ORDER — TORSEMIDE 20 MG PO TABS
20.0000 mg | ORAL_TABLET | Freq: Two times a day (BID) | ORAL | Status: DC
  Administered 2019-11-08: 20 mg via ORAL
  Filled 2019-11-08: qty 1

## 2019-11-08 MED ORDER — METOPROLOL SUCCINATE ER 25 MG PO TB24: 12.5000 mg | ORAL_TABLET | Freq: Every day | ORAL | 0 refills | Status: DC

## 2019-11-08 MED ORDER — DEXTROAMPHETAMINE SULFATE ER 15 MG PO CP24: 15.0000 mg | ORAL_CAPSULE | Freq: Two times a day (BID) | ORAL | 0 refills | Status: DC

## 2019-11-08 MED ORDER — LACTULOSE 10 GM/15ML PO SOLN: 30.0000 g | Freq: Two times a day (BID) | ORAL | 0 refills | Status: DC

## 2019-11-08 NOTE — Progress Notes (Signed)
[facility] hospice liaison visit note: Patient seen sitting up in bed, alert, wife Raquel Sarna at bedside. Patient appeared anxious and irritable. Raquel Sarna planning to leave to go home to meet the DME company that will be moving Mr. Rockefeller's bed. Discussed discharge plan with TOC Roseanne Reno, MD and Mrs. Jeannine Kitten.  Patient will discharge home via EMS this afternoon. Much emotional support given as Mrs. Fessel was very overwhelmed, but did confirm again her wish to have Mr. Cappelletti return home. Hospice team alerted to discharge. Signed out of facility DNR in place for transport.   Thank you. Flo Shanks BSN, RN, [place] [phone]

## 2019-11-08 NOTE — Plan of Care (Signed)
  Problem: Education: Goal: Knowledge of [facility] Education information/materials will improve Outcome: Not Progressing Goal: Mental status will improve Outcome: Not Progressing Note: Patient is confused and disoriented to time.

## 2019-11-08 NOTE — Discharge Summary (Signed)
[facility] [facility] [place] NAME: Miguel Palmer    MR#:  [id number]  DATE OF BIRTH:  08-Sep-1940  DATE OF ADMISSION:  11/05/2019 ADMITTING PHYSICIAN: Collier Bullock, MD  DATE OF DISCHARGE: 11/08/2019  PRIMARY CARE PHYSICIAN: [name], MD    ADMISSION DIAGNOSIS:  Anasarca [R60.1]  DISCHARGE DIAGNOSIS:  Principal Problem:   Anasarca Active Problems:   Obstructive apnea   COPD (chronic obstructive pulmonary disease) (HCC)   Chronic respiratory failure with hypoxia (HCC)   Diabetes mellitus without complication (HCC)   Morbid obesity with BMI of 40.0-44.9, adult (HCC)   Chronic pain syndrome   Moderate protein-calorie malnutrition (HCC)   Pulmonary hypertension (HCC)   Lymphedema   Weakness   Acute on chronic diastolic CHF (congestive heart failure) (HCC)   Aortic valve stenosis   Acute metabolic encephalopathy   Cirrhosis of liver with ascites (HCC)   Insomnia   Chronic cutaneous venous stasis ulcer (HCC)   Chronic a-fib (HCC)   Anemia   SECONDARY DIAGNOSIS:   Past Medical History:  Diagnosis Date  . Arteriosclerosis    7 stents placed no s/s x 7 years  . Biceps tendinitis 05/29/2014  . COPD (chronic obstructive pulmonary disease) ([place])   . Diabetes mellitus without complication ([place])   . Disorder of rotator cuff 05/29/2014  . Edema    bilateral lower extremities  . Frank hematuria 04/28/2013  . Memory change 01/13/2014  . Narcolepsy   . Obesity   . 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 Oxygen deficiency     HOSPITAL COURSE:   1.  Acute on Chronic diastolic congestive heart failure with anasarca and scrotal edema.  Patient also has moderate aortic stenosis seen on echocardiogram along with severe pulmonary hypertension.  Patient was given IV Bumex here in the hospital.  I will switch back to torsemide 20 mg twice a day upon going home.  Continue low-dose Toprol at night. 2.  Acute metabolic encephalopathy.  Ammonia  level is slightly high so this could potentially be hepatic encephalopathy on top of things.  Prescribed lactulose twice a day.  CT scan of the head negative.  B12 and TSH normal range.  RPR nonreactive.  Decrease dose of gabapentin. 3.  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  NASH with ascites.  Paracentesis yesterday drew off 2 L of fluid.  I empirically started antibiotic yesterday but culture is negative so we will discontinue antibiotic.  We will start spironolactone low-dose at night.  Continue torsemide twice daily. 4.  Insomnia.  Slept with Seroquel last night will prescribe as needed at home.  Decreased dose of narcolepsy medications. 5.  COPD with chronic hypoxic respiratory failure continue oxygen supplementation 3 L. 6.  Neuropathy on gabapentin decreased dose 7.  Moderate protein calorie malnutrition 8.  Chronic venous stasis ulcers right leg greater than left leg continue to cover daily with nonstick dressing Kerlix and Ace wrap. 9.  Chronic atrial fibrillation.  I added Toprol at night.  Hesitant on full dose anticoagulation with iron deficiency anemia 10.  Iron deficiency anemia. Oral iron.  DISCHARGE CONDITIONS:   Fair patient will go home with hospice  CONSULTS OBTAINED:  None  DRUG ALLERGIES:   Allergies  Allergen Reactions  . Bee Pollen Shortness Of Breath    Post nasal drip  . Pollen Extract Shortness Of Breath    Post nasal drip Post nasal drip  . Codeine Other (See Comments)  . Morphine Itching    (+)  itching and redness noted to IV insertion site following administration. See ED nurses note from 11/25 at 0449. (+) itching and redness noted to IV insertion site following administration. See ED nurses note from 11/25 at 0449.   Marland Kitchen Morphine And Related Itching    (+) itching and redness noted to IV insertion site following administration. See ED nurses note from 11/25 at 0449.    DISCHARGE MEDICATIONS:   Allergies as of 11/08/2019      Reactions    Bee Pollen Shortness Of Breath   Post nasal drip   Pollen Extract Shortness Of Breath   Post nasal drip Post nasal drip   Codeine Other (See Comments)   Morphine Itching   (+) itching and redness noted to IV insertion site following administration. See ED nurses note from 11/25 at 0449. (+) itching and redness noted to IV insertion site following administration. See ED nurses note from 11/25 at 0449.   Morphine And Related Itching   (+) itching and redness noted to IV insertion site following administration. See ED nurses note from 11/25 at 0449.      Medication List    STOP taking these medications   guaiFENesin 600 MG 12 hr tablet Commonly known as: MUCINEX     TAKE these medications   dextroamphetamine 15 MG 24 hr capsule Commonly known as: DEXEDRINE SPANSULE Take 1 capsule (15 mg total) by mouth 2 (two) times daily with breakfast and lunch. What changed:   when to take this  additional instructions   ferrous sulfate 325 (65 FE) MG EC tablet Take 1 tablet (325 mg total) by mouth daily with breakfast. Start taking on: Nov 09, 2019   gabapentin 400 MG capsule Commonly known as: NEURONTIN Take 1 capsule (400 mg total) by mouth 2 (two) times daily. What changed: when to take this   lactulose 10 GM/15ML solution Commonly known as: CHRONULAC Take 45 mLs (30 g total) by mouth 2 (two) times daily.   metoprolol succinate 25 MG 24 hr tablet Commonly known as: TOPROL-XL Take 0.5 tablets (12.5 mg total) by mouth at bedtime.   modafinil 100 MG tablet Commonly known as: PROVIGIL Take 1 tablet (100 mg total) by mouth 2 (two) times daily with breakfast and lunch. [id number] What changed: when to take this   nitroGLYCERIN 0.4 MG SL tablet Commonly known as: NITROSTAT Place 0.4 mg under the tongue every 5 (five) minutes as needed for chest pain.   QUEtiapine 25 MG tablet Commonly known as: SEROQUEL Take 1 tablet (25 mg total) by mouth at bedtime as needed.    spironolactone 25 MG tablet Commonly known as: ALDACTONE Take 1 tablet (25 mg total) by mouth at bedtime.   torsemide 20 MG tablet Commonly known as: DEMADEX Take 1 tablet (20 mg total) by mouth 2 (two) times daily. What changed: when to take this        DISCHARGE INSTRUCTIONS:   Follow-up with hospice in the home   If you experience worsening of your admission symptoms, develop shortness of breath, life threatening emergency, suicidal or homicidal thoughts you must seek medical attention immediately by calling 911 or calling your MD immediately  if symptoms less severe.  You Must read complete instructions/literature along with all the possible adverse reactions/side effects for all the Medicines you take and that have been prescribed to you. Take any new Medicines after you have completely understood and accept all the possible adverse reactions/side effects.   Please note  You were cared  for by a hospitalist during your hospital stay. If you have any questions about your discharge medications or the care you received while you were in the hospital after you are discharged, you can call the unit and asked to speak with the hospitalist on call if the hospitalist that took care of you is not available. Once you are discharged, your primary care physician will handle any further medical issues. Please note that NO REFILLS for any discharge medications will be authorized once you are discharged, as it is imperative that you return to your primary care physician (or establish a relationship with a primary care physician if you do not have one) for your aftercare needs so that they can reassess your need for medications and monitor your lab values.    Today   CHIEF COMPLAINT:   Chief Complaint  Patient presents with  . Groin Swelling    HISTORY OF PRESENT ILLNESS:  Miguel Palmer  is a 79 y.o. male came in with anasarca and scrotal swelling   VITAL SIGNS:  Blood pressure  104/69, pulse 91, temperature 97.6 F (36.4 C), temperature source Oral, resp. rate 16, height 6' 3"  (1.905 m), weight 129 kg, SpO2 99 %.  I/O:    Intake/Output Summary (Last 24 hours) at 11/08/2019 1002 Last data filed at 11/08/2019 0856 Gross per 24 hour  Intake 240 ml  Output 1100 ml  Net -860 ml    PHYSICAL EXAMINATION:  GENERAL:  79 y.o.-year-old patient lying in the bed with no acute distress.  EYES: Pupils equal, round, reactive to light and accommodation. No scleral icterus.  HEENT: Head atraumatic, normocephalic. Oropharynx and nasopharynx clear.   LUNGS: Decreased breath sounds bilaterally, no wheezing, rales,rhonchi or crepitation. No use of accessory muscles of respiration.  CARDIOVASCULAR: S1, S2 irregular regular. No murmurs, rubs, or gallops.  ABDOMEN: Soft, non-tender, distended. Genitourinary: 2+ scrotal edema EXTREMITIES: 2+ pedal edema.  PSYCHIATRIC: The patient is alert and wanting to go home.  SKIN: Leg wounds covered today  DATA REVIEW:   CBC Recent Labs  Lab 11/08/19 0440  WBC 4.5  HGB 8.0*  HCT 28.1*  PLT 155    Chemistries  Recent Labs  Lab 11/05/19 1118 11/06/19 0455 11/08/19 0440  NA 137   < > 140  K 4.1   < > 3.7  CL 92*   < > 92*  CO2 38*   < > 37*  GLUCOSE 111*   < > 111*  BUN 32*   < > 31*  CREATININE 1.22   < > 1.06  CALCIUM 9.4   < > 9.2  AST 29  --   --   ALT 19  --   --   ALKPHOS 127*  --   --   BILITOT 1.3*  --   --    < > = values in this interval not displayed.    Microbiology Results  Results for orders placed or performed during the hospital encounter of 11/05/19  SARS Coronavirus 2 by RT PCR (hospital order, performed in [facility] hospital lab) Nasopharyngeal Nasopharyngeal Swab     Status: None   Collection Time: 11/05/19  1:09 PM   Specimen: Nasopharyngeal Swab  Result Value Ref Range Status   SARS Coronavirus 2 NEGATIVE NEGATIVE Final    Comment: (NOTE) SARS-CoV-2 target nucleic acids are NOT DETECTED. The  SARS-CoV-2 RNA is generally detectable in upper and lower respiratory specimens during the acute phase of infection. The lowest concentration of SARS-CoV-2 viral  copies this assay can detect is 250 copies / mL. A negative result does not preclude SARS-CoV-2 infection and should not be used as the sole basis for treatment or other patient management decisions.  A negative result may occur with improper specimen collection / handling, submission of specimen other than nasopharyngeal swab, presence of viral mutation(s) within the areas targeted by this assay, and inadequate number of viral copies (<250 copies / mL). A negative result must be combined with clinical observations, patient history, and epidemiological information. Fact Sheet for Patients:   StrictlyIdeas.no Fact Sheet for Healthcare Providers: BankingDealers.co.za This test is not yet approved or cleared  by the [place] FDA and has been authorized for detection and/or diagnosis of SARS-CoV-2 by FDA under an Emergency Use Authorization (EUA).  This EUA will remain in effect (meaning this test can be used) for the duration of the COVID-19 declaration under Section 564(b)(1) of the Act, 21 U.S.C. section 360bbb-3(b)(1), unless the authorization is terminated or revoked sooner. Performed at [facility], [address] [place], [place] [address]   Body fluid culture     Status: None (Preliminary result)   Collection Time: 11/07/19 12:04 PM   Specimen: PATH Cytology Peritoneal fluid  Result Value Ref Range Status   Specimen Description   Final    PERITONEAL Performed at [facility], [address], [place], [place] [address]    Special Requests   Final    NONE Performed at [facility], [address] [place], [place] [address]    Gram Stain   Final    MODERATE WBC PRESENT, PREDOMINANTLY MONONUCLEAR NO ORGANISMS SEEN    Culture   Final     NO GROWTH < 24 HOURS Performed at [facility], [place] [address], [place], [place] [address]    Report Status PENDING  Incomplete    RADIOLOGY:  CT HEAD WO CONTRAST  Result Date: 11/06/2019 CLINICAL DATA:  Altered behavior/mental status. EXAM: CT HEAD WITHOUT CONTRAST TECHNIQUE: Contiguous axial images were obtained from the base of the skull through the vertex without intravenous contrast. COMPARISON:  None. FINDINGS: Brain: No evidence of acute infarction, hemorrhage, hydrocephalus, extra-axial collection or mass lesion/mass effect. Vascular: No hyperdense vessel or unexpected calcification. Skull: Normal. Negative for fracture or focal lesion. Sinuses/Orbits: Visualized globes and orbits are unremarkable. The visualized sinuses are clear. Other: None. IMPRESSION: Normal unenhanced CT scan of the brain for age. Electronically Signed   By: Lajean Manes M.D.   On: 11/06/2019 12:00   US Venous Img Lower Bilateral (DVT)  Result Date: 11/07/2019 CLINICAL DATA:  Bilateral lower extremity swelling EXAM: BILATERAL LOWER EXTREMITY VENOUS DOPPLER ULTRASOUND TECHNIQUE: Gray-scale sonography with graded compression, as well as color Doppler and duplex ultrasound were performed to evaluate the lower extremity deep venous systems from the level of the common femoral vein and including the common femoral, femoral, profunda femoral, popliteal and calf veins including the posterior tibial, peroneal and gastrocnemius veins when visible. The superficial great saphenous vein was also interrogated. Spectral Doppler was utilized to evaluate flow at rest and with distal augmentation maneuvers in the common femoral, femoral and popliteal veins. COMPARISON:  None. FINDINGS: RIGHT LOWER EXTREMITY Common Femoral Vein: No evidence of thrombus. Normal compressibility, respiratory phasicity and response to augmentation. Saphenofemoral Junction: No evidence of thrombus. Normal compressibility and flow on color Doppler  imaging. Profunda Femoral Vein: No evidence of thrombus. Normal compressibility and flow on color Doppler imaging. Femoral Vein: No evidence of thrombus.  Normal compressibility, respiratory phasicity and response to augmentation. Popliteal Vein: No evidence of thrombus. Normal compressibility, respiratory phasicity and response to augmentation. Calf Veins: Limited visualization because of body habitus and peripheral edema. Posterior tibial vein appears patent. Peroneal vein not visualized. No gross thrombus evident. LEFT LOWER EXTREMITY Common Femoral Vein: No evidence of thrombus. Normal compressibility, respiratory phasicity and response to augmentation. Saphenofemoral Junction: No evidence of thrombus. Normal compressibility and flow on color Doppler imaging. Profunda Femoral Vein: No evidence of thrombus. Normal compressibility and flow on color Doppler imaging. Femoral Vein: No evidence of thrombus. Normal compressibility, respiratory phasicity and response to augmentation. Popliteal Vein: No evidence of thrombus. Normal compressibility, respiratory phasicity and response to augmentation. Calf Veins: Similar limited visualization because of edema and body habitus. Posterior tibial vein appears patent. Peroneal vein not visualized. No gross thrombus evident. IMPRESSION: No significant lower extremity DVT in either extremity. Limited assessment of the calf veins because of body habitus and edema. Electronically Signed   By: Jerilynn Mages.  Shick M.D.   On: 11/07/2019 12:11   US Paracentesis  Result Date: 11/07/2019 INDICATION: Patient with history of CHF, cirrhosis, portal venous hypertension and noted to have diffuse anasarca. Request to IR for diagnostic and therapeutic paracentesis. EXAM: ULTRASOUND GUIDED DIAGNOSTIC AND THERAPEUTIC PARACENTESIS MEDICATIONS: 10 mL 1% lidocaine COMPLICATIONS: None immediate. PROCEDURE: Informed written consent was obtained from the patient after a discussion of the risks, benefits and  alternatives to treatment. A timeout was performed prior to the initiation of the procedure. Initial ultrasound scanning demonstrates a moderate amount of ascites within the right lower abdominal quadrant. The right lower abdomen was prepped and draped in the usual sterile fashion. 1% lidocaine was used for local anesthesia. Following this, a 6 Fr Safe-T-Centesis catheter was introduced. An ultrasound image was saved for documentation purposes. The paracentesis was performed. The catheter was removed and a dressing was applied. The patient tolerated the procedure well without immediate post procedural complication. FINDINGS: A total of approximately 2.0 L of clear, light amber fluid was removed. Samples were sent to the laboratory as requested by the clinical team. IMPRESSION: Successful ultrasound-guided paracentesis yielding 2.0 liters of peritoneal fluid. Read by Candiss Norse, PA-C Electronically Signed   By: Markus Daft M.D.   On: 11/07/2019 12:28     Management plans discussed with the patient, family and they are in agreement.  CODE STATUS:     Code Status Orders  (From admission, onward)         Start     Ordered   11/05/19 1252  Do not attempt resuscitation (DNR)  Continuous    Question Answer Comment  In the event of cardiac or respiratory ARREST Do not call a "code blue"   In the event of cardiac or respiratory ARREST Do not perform Intubation, CPR, defibrillation or ACLS   In the event of cardiac or respiratory ARREST Use medication by any route, position, wound care, and other measures to relive pain and suffering. May use oxygen, suction and manual treatment of airway obstruction as needed for comfort.      11/05/19 1254        Code Status History    Date Active Date Inactive Code Status Order ID Comments User Context   10/12/2019 1120 10/18/2019 2247 DNR [id number]  Philis Pique, NP Inpatient   10/11/2019 1523 10/12/2019 1120 Full Code [id number]  Max Sane, MD ED    06/24/2016 0451 06/27/2016 2010 Full Code [id number]  Harrie Foreman, MD  Inpatient   05/17/2016 1001 05/19/2016 1752 Full Code [id number]  Saundra Shelling, MD Inpatient   Advance Care Planning Activity      TOTAL TIME TAKING CARE OF THIS PATIENT: 35 minutes.    Loletha Grayer M.D on 11/08/2019 at 10:02 AM  Between 7am to 6pm - Pager - [phone]  After 6pm go to [email or web address] - password [email or web address]  [facility]  CC: Primary care physician; [name], MD

## 2019-11-10 LAB — BODY FLUID CULTURE: Culture: NO GROWTH

## 2019-11-14 ENCOUNTER — Ambulatory Visit: Payer: Medicare Other | Admitting: Family

## 2019-11-22 DEATH — deceased

## 2020-11-21 DEATH — deceased
